# Patient Record
Sex: Female | Born: 1940 | Race: White | Hispanic: No | Marital: Married | Smoking: Never smoker
Health system: Southern US, Academic
[De-identification: ages and names within clinical notes are randomized; demographics above are authoritative.]

## PROBLEM LIST (undated history)

## (undated) DIAGNOSIS — H3530 Unspecified macular degeneration: Secondary | ICD-10-CM

## (undated) DIAGNOSIS — I749 Embolism and thrombosis of unspecified artery: Secondary | ICD-10-CM

## (undated) DIAGNOSIS — E119 Type 2 diabetes mellitus without complications: Secondary | ICD-10-CM

## (undated) DIAGNOSIS — C541 Malignant neoplasm of endometrium: Secondary | ICD-10-CM

## (undated) DIAGNOSIS — Z853 Personal history of malignant neoplasm of breast: Secondary | ICD-10-CM

## (undated) DIAGNOSIS — C3490 Malignant neoplasm of unspecified part of unspecified bronchus or lung: Secondary | ICD-10-CM

## (undated) DIAGNOSIS — I10 Essential (primary) hypertension: Secondary | ICD-10-CM

## (undated) HISTORY — DX: Personal history of malignant neoplasm of breast: Z85.3

## (undated) HISTORY — PX: HX GALL BLADDER SURGERY/CHOLE: SHX55

## (undated) HISTORY — DX: Embolism and thrombosis of unspecified artery: I74.9

## (undated) HISTORY — DX: Malignant neoplasm of unspecified part of unspecified bronchus or lung: C34.90

## (undated) HISTORY — PX: LUNG CANCER SURGERY: SHX702

## (undated) HISTORY — PX: HX COLONOSCOPY: 2100001147

## (undated) HISTORY — PX: PORTACATH PLACEMENT: SHX2246

## (undated) HISTORY — PX: HX LOBECTOMY: SHX167

## (undated) HISTORY — DX: Malignant neoplasm of endometrium: C54.1

## (undated) HISTORY — PX: HX HYSTERECTOMY: SHX81

## (undated) HISTORY — PX: HIP SURGERY: SHX245

---

## 1988-06-12 ENCOUNTER — Other Ambulatory Visit (HOSPITAL_COMMUNITY): Payer: Self-pay

## 2015-12-29 IMAGING — MG 3D SCREENING DIGITAL MAMMOGRAM WITH CAD
5 series · 7 of 24 positions shown · non-contrast
Comparison: 06/03/2016 and 05/11/2015.

------------- REPORT [id number] -------------
Exam:  

Annual screening digital mammogram with 3D Tomosynthesis with CAD
INDICATION: Screening.

[R CC · right · 0.10mm/px · 2 of 2 slices shown]
[im 1/2]
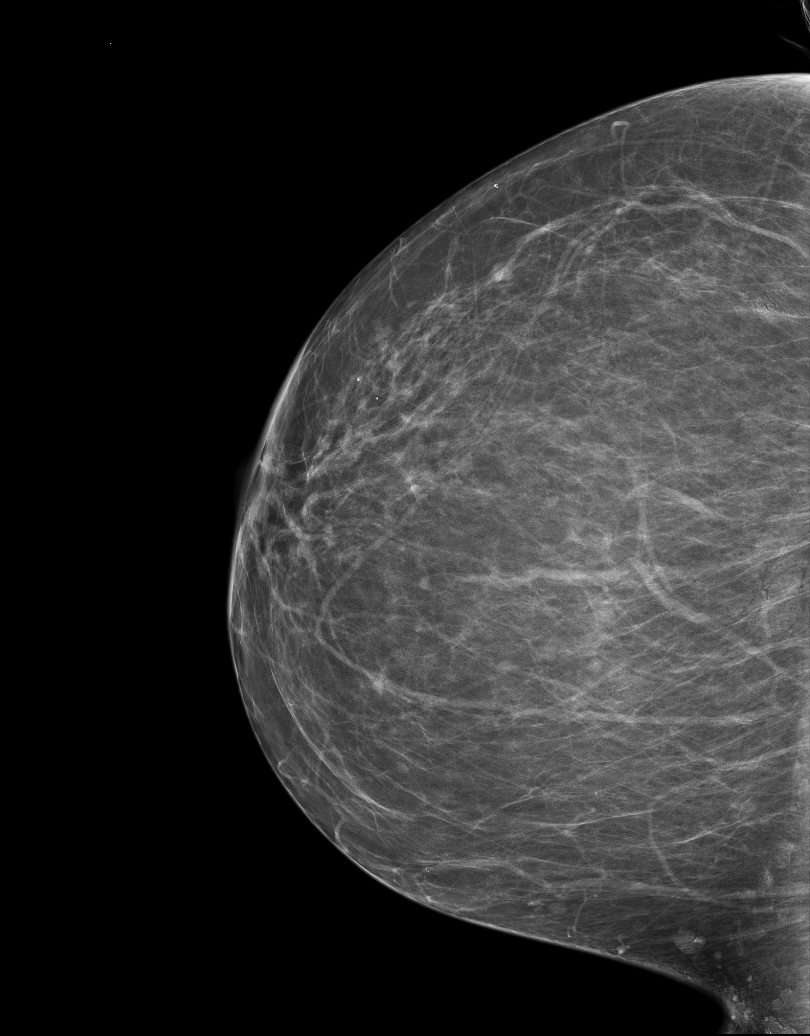
[im 2/2]
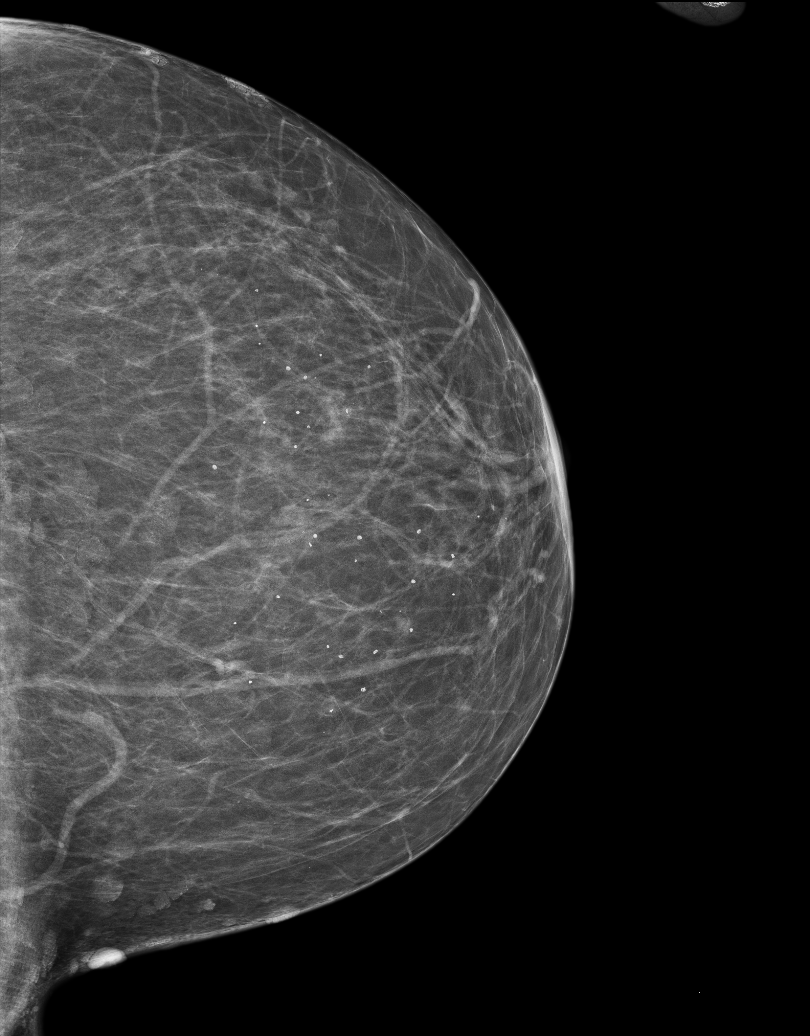

[3D SCREENING DIGITAL MAMMOGRAM WITH CAD · 2 acquisitions, 2 frames shown (1 of 2)]
[im 1/2]
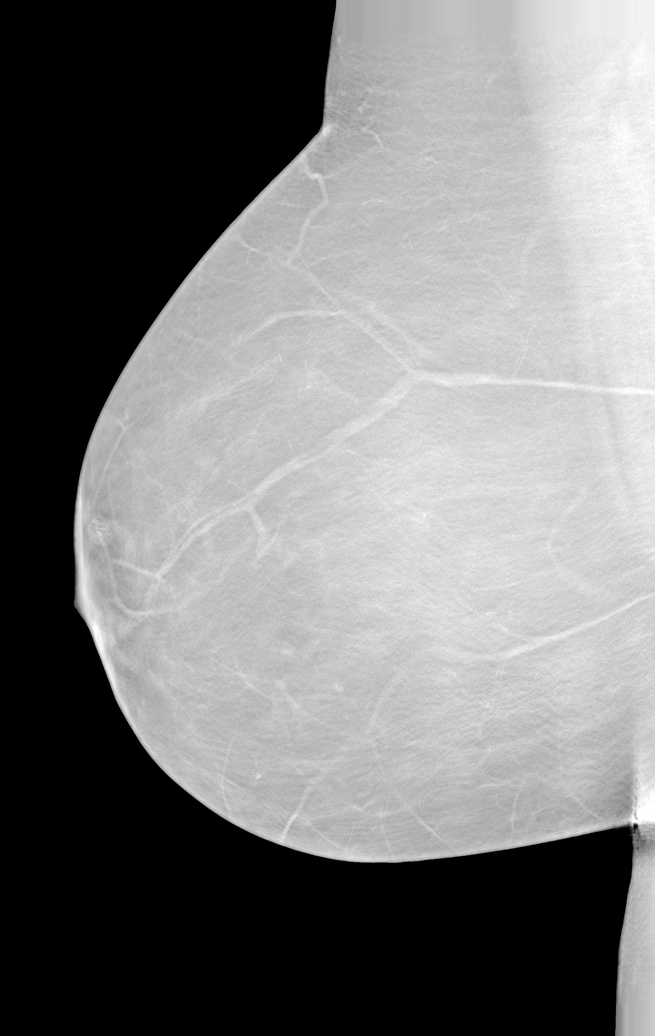
[im 2/2]
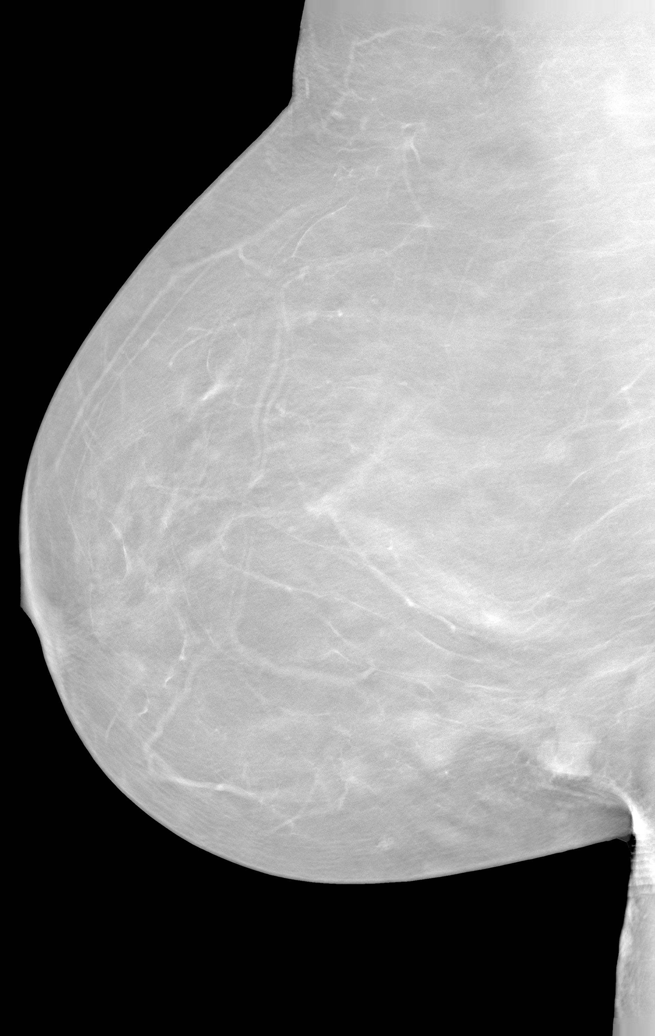

[R]
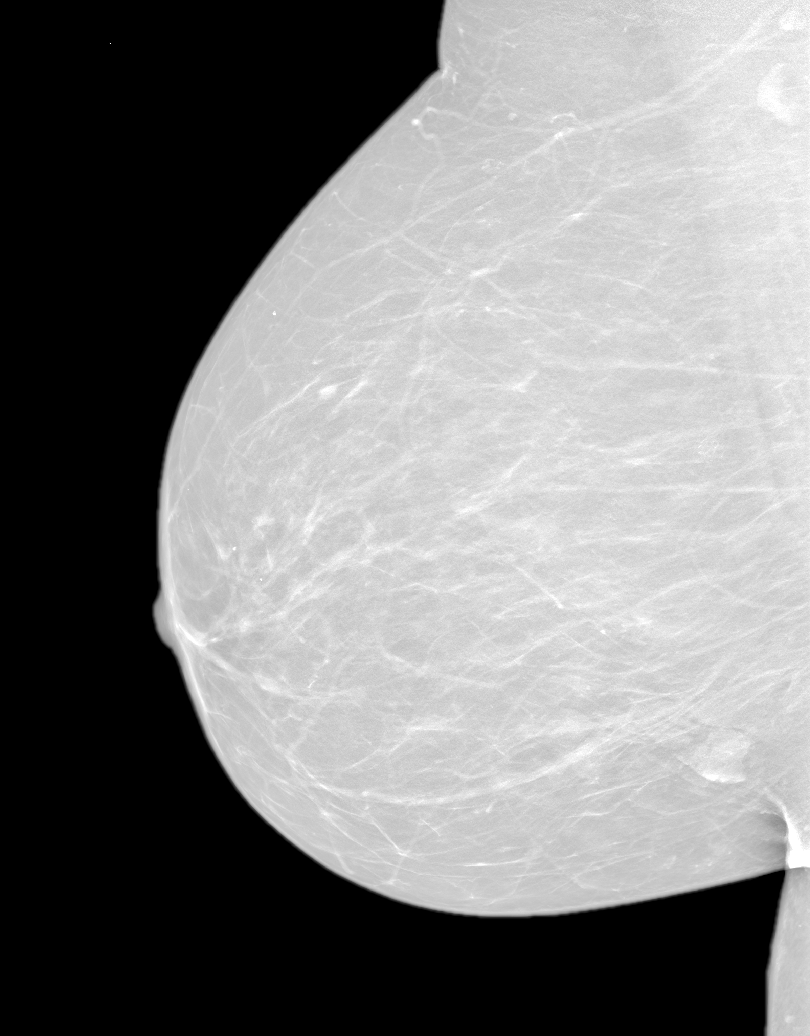

[3D SCREENING DIGITAL MAMMOGRAM WITH CAD (2 of 2) · tomo slice 13/81.0]
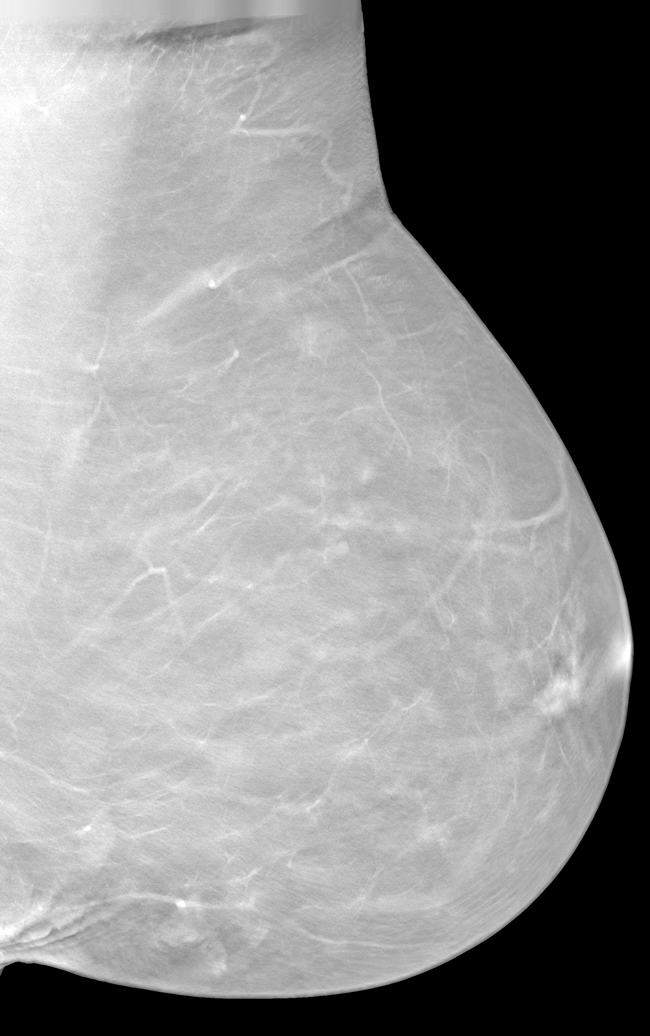

[L]
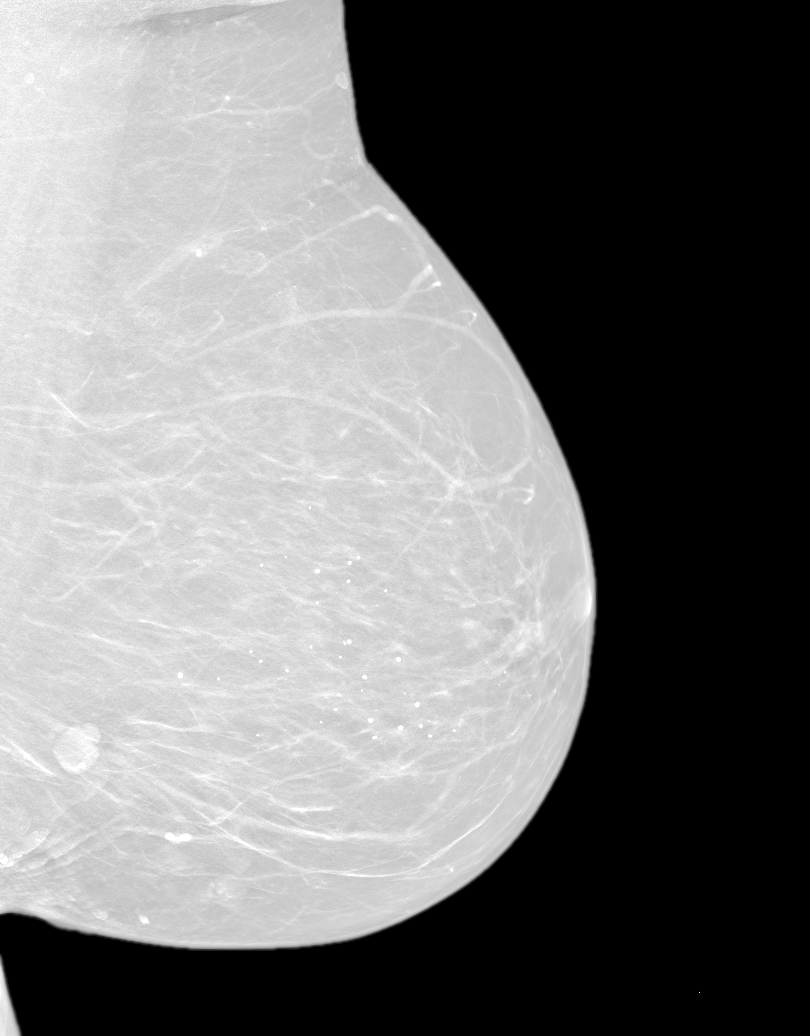

[7 of 24 positions shown; findings below may reference images not displayed]

FINDINGS: There are scattered fibroglandular elements. Multiple skin moles are again noted. There is no mass or suspicious cluster of microcalcifications. There is no architectural distortion, skin thickening or nipple retraction.
IMPRESSION: BIRADS 2-Benign findings. Patient has been added in a reminder system with a

target date for the next screening mammography.

DENSITY CODE –  B (Scattered areas of fibroglandular density).

Final Assessment Code:

Bi-Rads 2 

BI-RADS 0
Need additional imaging evaluation

BI-RADS 1
Negative mammogram

BI-RADS 2
Benign finding

BI-RADS 3
Probably benign finding: short-interval follow-up suggested

BI-RADS 4
Suspicious abnormality:  biopsy should be considered

BI-RADS 5
Highly suggestive of malignancy; appropriate action should be taken

BI-RADS 6
Known Biopsy-proven Malignancy – Appropriate action should be taken

NOTE:
In compliance with Federal regulations, the results of this mammogram are being sent to the patient.

------------- REPORT [id number] -------------
[facility]
[address]
Daina Ms.HAERTER, SHAKILL:
We wish to report the following on your recent mammography examination. We are sending a report to your referring physician or other health care provider. 
(       Normal/Negative:
No evidence of cancer.
This statement is mandated by the [place], Department of Health.
Your examination was performed by one of our technologists, who are registered radiological technologists and also specially certified in mammography:
___
Parlak, Edaly (M)
___
Dang, Mcalex (M)

Your mammogram was interpreted by our radiologist.

( 
Sofeine Made, M.D.

(Annual Breast Examination by a physician or other health care provider
(Annual Mammography Screening beginning at age 40
(Monthly Breast Self Examination

## 2016-12-31 IMAGING — MG 3D SCREENING MAMMO BIL W/CAD
5 series · 7 of 24 positions shown · non-contrast
Comparison: 04/02/2017 and 01/06/2016.

------------- REPORT [id number] -------------
Exam:  

Annual screening digital mammogram with 3D Tomosynthesis with CAD
INDICATION: Screening.

[R CC · right · 0.10mm/px · 2 of 2 slices shown]
[im 1/2]
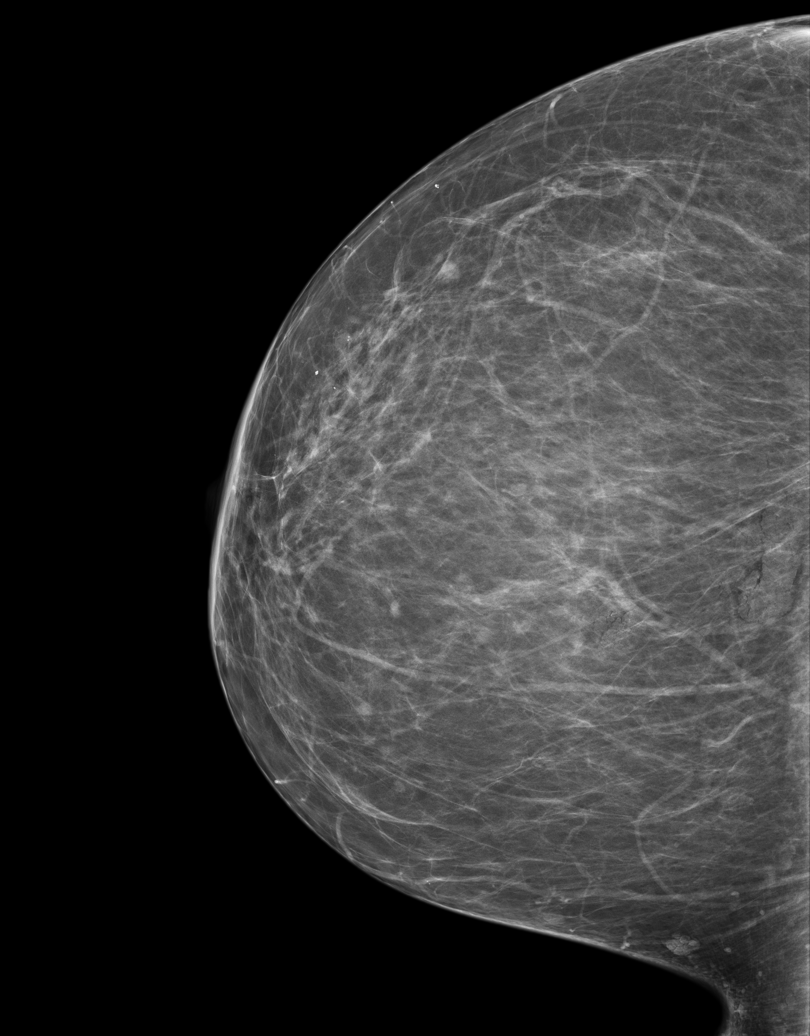
[im 2/2]
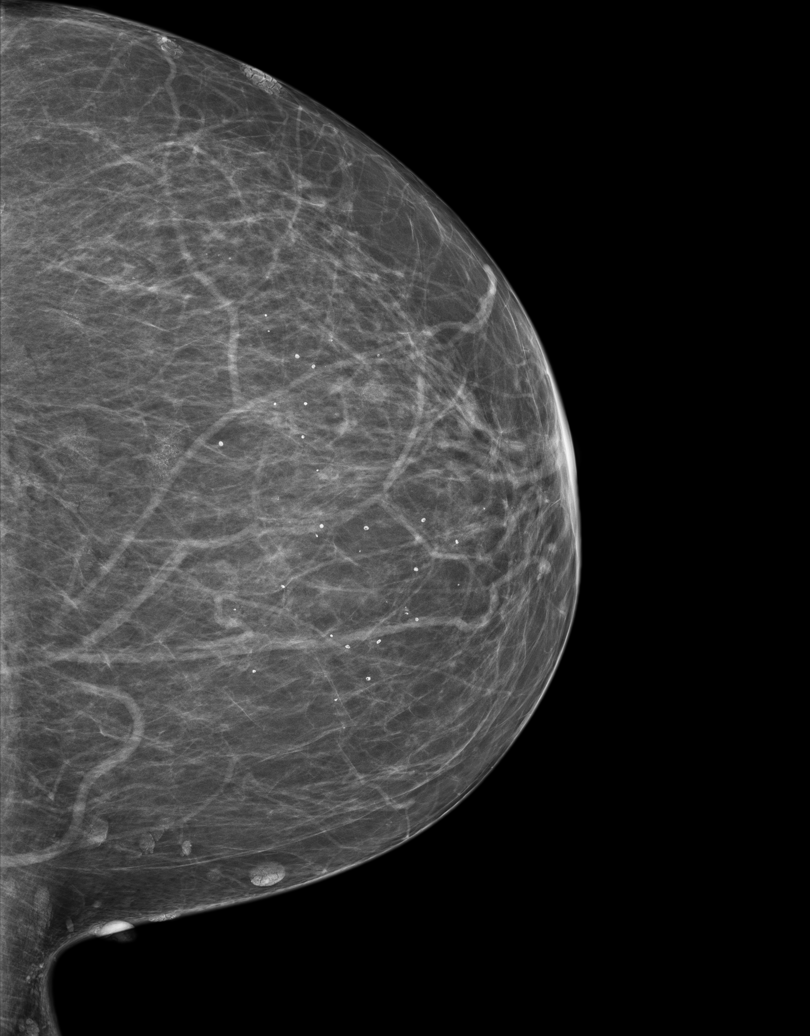

[3D SCREENING MAMMO BIL W/CAD · 2 acquisitions, 2 frames shown (1 of 2)]
[im 1/2]
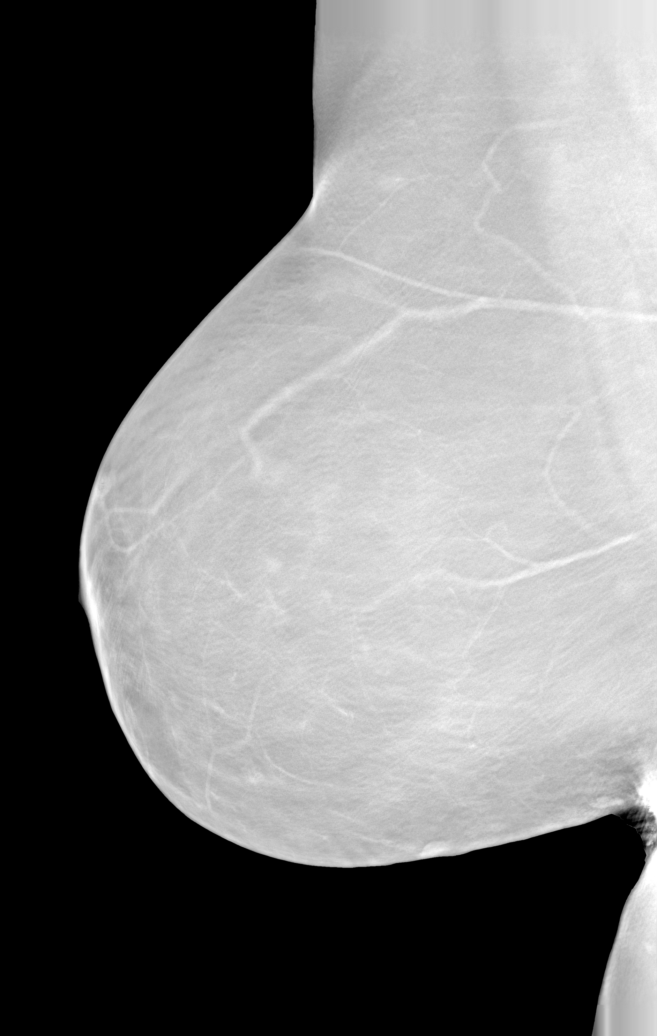
[im 2/2]
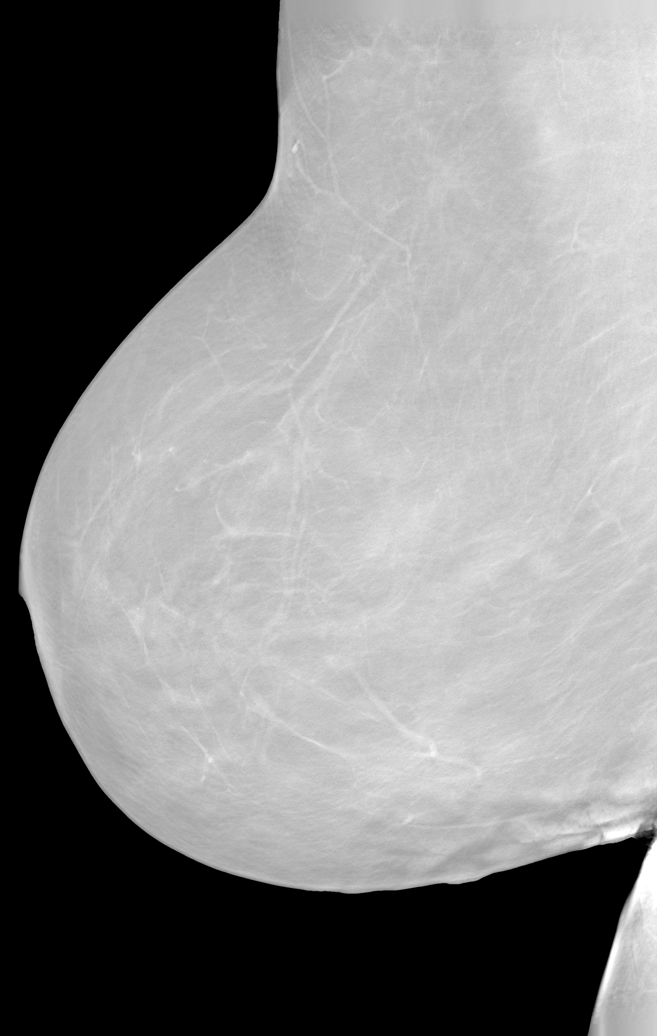

[3D SCREENING MAMMO BIL W/CAD (2 of 2) · tomo slice 13/83.0]
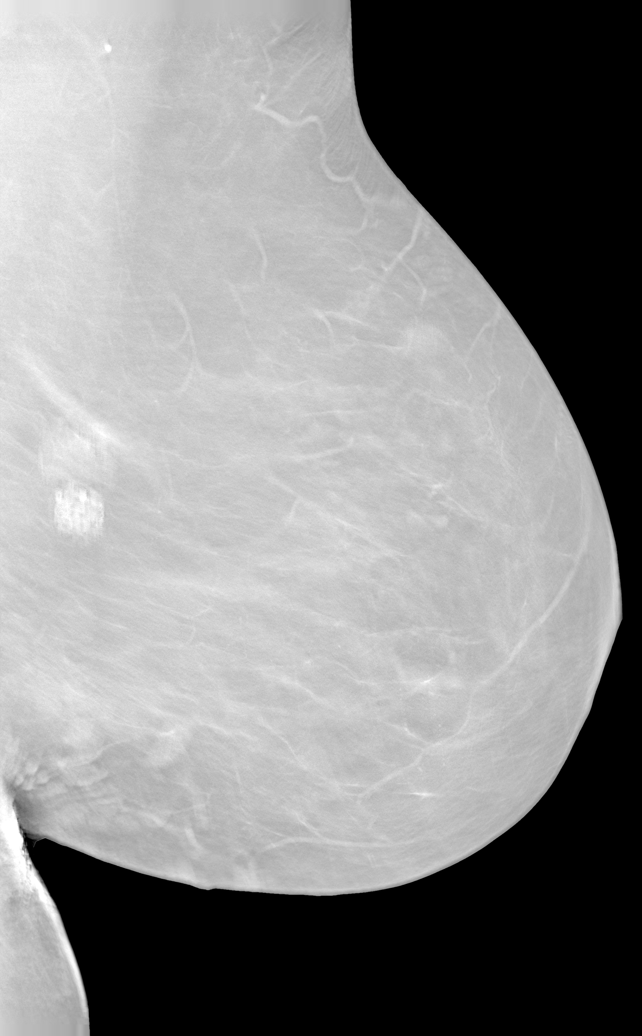

[R]
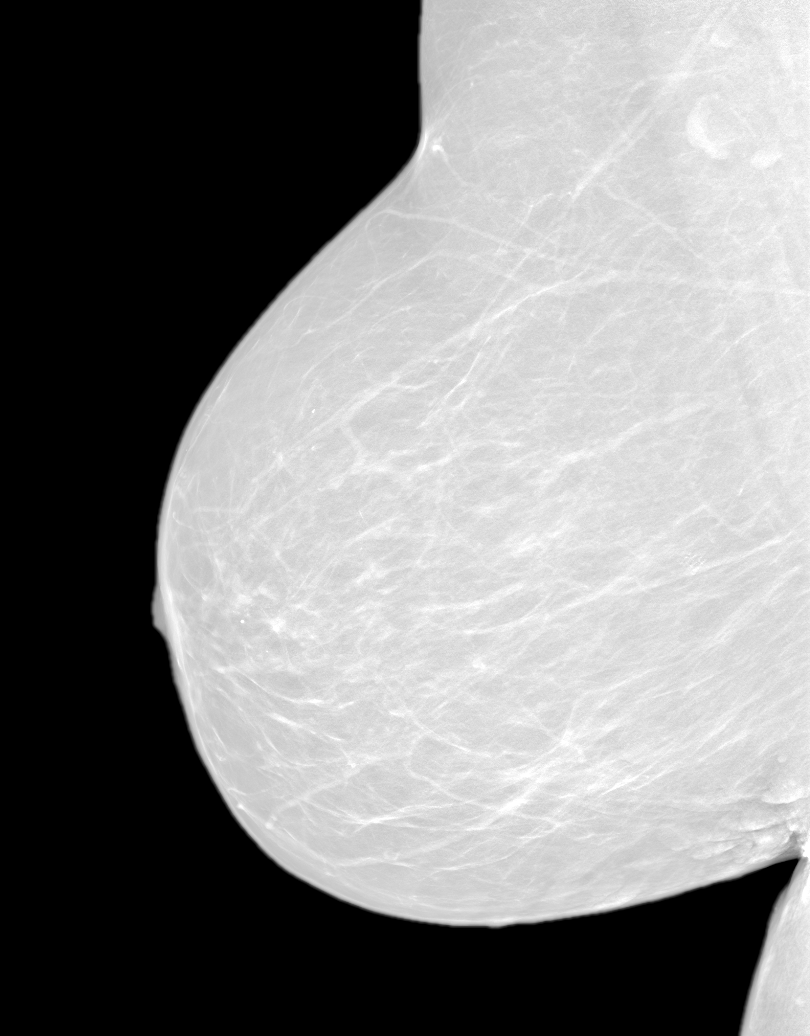

[L]
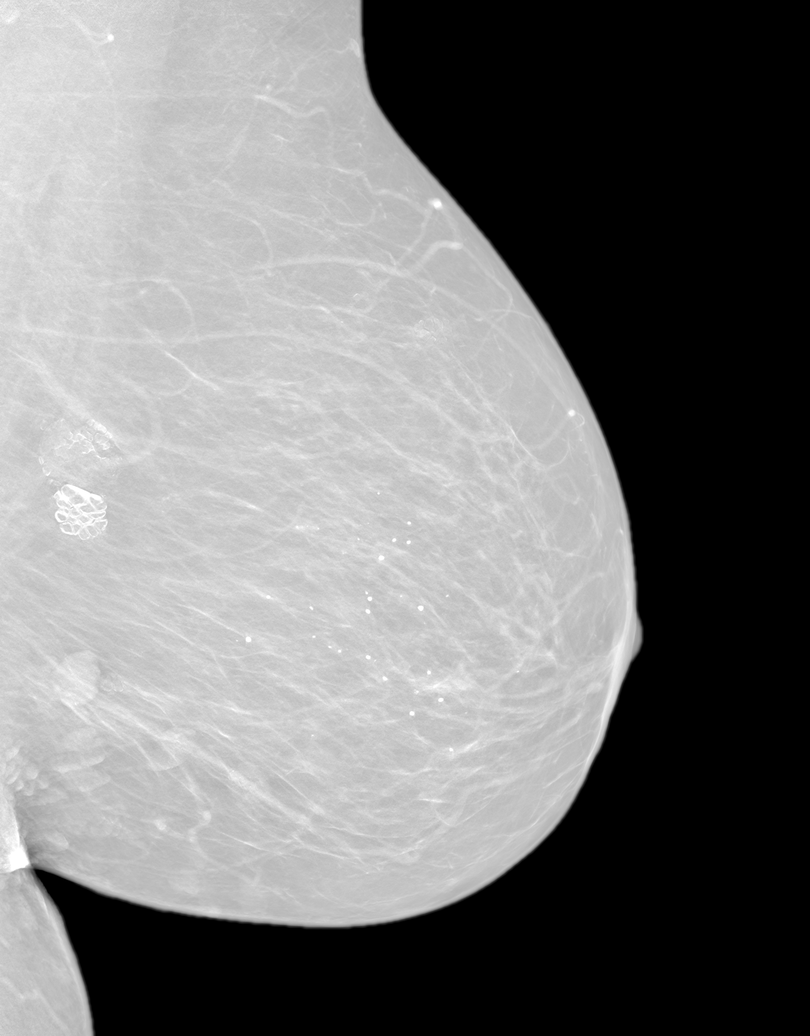

[7 of 24 positions shown; findings below may reference images not displayed]

FINDINGS: There are scattered fibroglandular elements. Multiple moles are again identified bilaterally. There is no mass or suspicious cluster of microcalcifications. There is no architectural distortion, skin thickening or nipple retraction.
IMPRESSION: BIRADS 2-Benign findings. Patient has been added in a reminder system with a

target date for the next screening mammography.

DENSITY CODE –  B (Scattered areas of fibroglandular density). 

Final Assessment Code:

Bi-Rads 2 

BI-RADS 0
Need additional imaging evaluation

BI-RADS 1
Negative mammogram

BI-RADS 2
Benign finding

BI-RADS 3
Probably benign finding: short-interval follow-up suggested

BI-RADS 4
Suspicious abnormality:  biopsy should be considered

BI-RADS 5
Highly suggestive of malignancy; appropriate action should be taken

BI-RADS 6
Known Biopsy-proven Malignancy – Appropriate action should be taken

NOTE:
In compliance with Federal regulations, the results of this mammogram are being sent to the patient.

------------- REPORT [id number] -------------
[facility]
[address]
Daina Ms.HAERTER, SHAKILL:
We wish to report the following on your recent mammography examination. We are sending a report to your referring physician or other health care provider. 
(       Normal/Negative:
No evidence of cancer.
This statement is mandated by the [place], Department of Health.
Your examination was performed by one of our technologists, who are registered radiological technologists and also specially certified in mammography:
___
Parlak, Edaly (M)
___
Dang, Mcalex (M)

Your mammogram was interpreted by our radiologist.

( 
Sofeine Made, M.D.

(Annual Breast Examination by a physician or other health care provider
(Annual Mammography Screening beginning at age 40
(Monthly Breast Self Examination

## 2018-04-14 IMAGING — MG 3D SCREENING MAMMO BIL W/CAD
4 series · 7 of 24 positions shown · non-contrast
Comparison: Mammograms 11/04/2018, 11/01/2017, 08/06/2016.

------------- REPORT [id number] -------------
EXAM:  3D BILATERAL ANNUAL SCREENING DIGITAL MAMMOGRAM WITH TOMOSYNTHESIS AND CAD
INDICATION: Screening.

[R CC · right · 0.10mm/px · 3 of 3 slices shown]
[im 1/3]
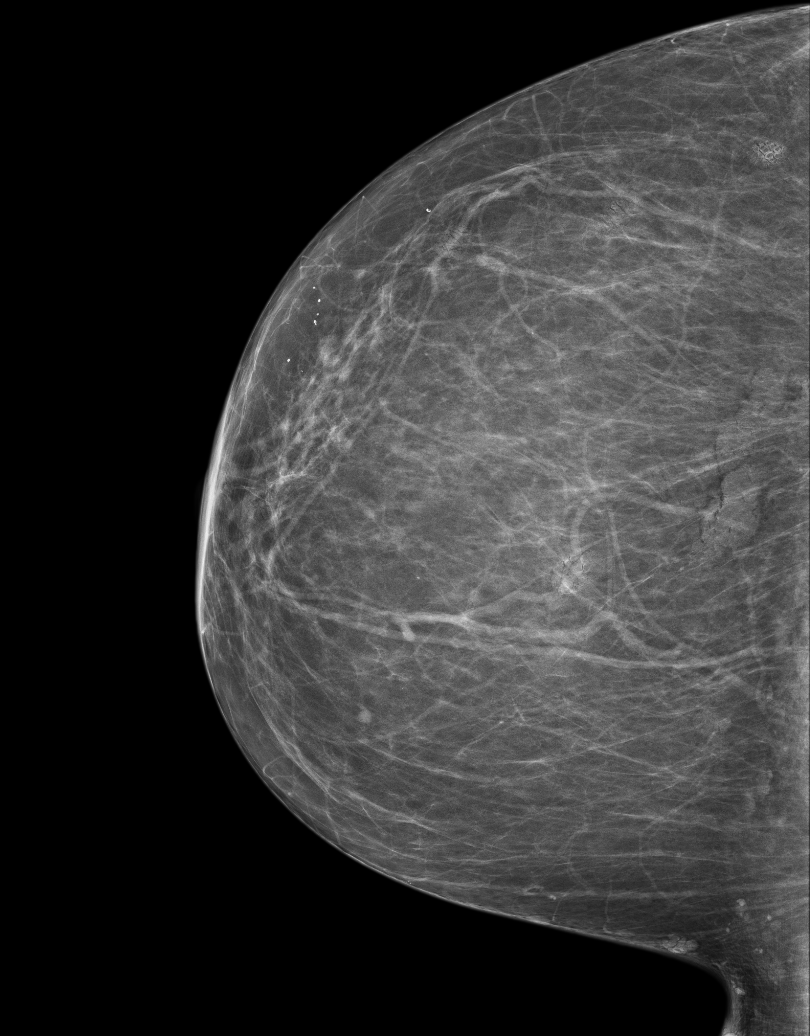
[im 2/3]
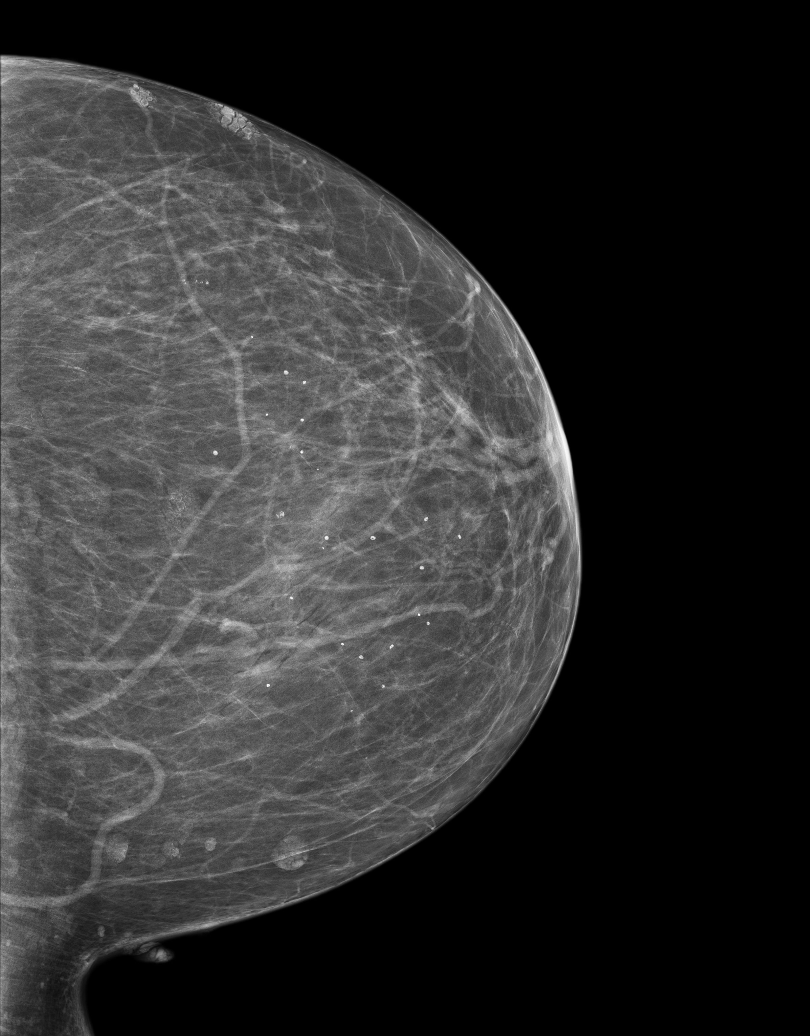
[im 3/3]
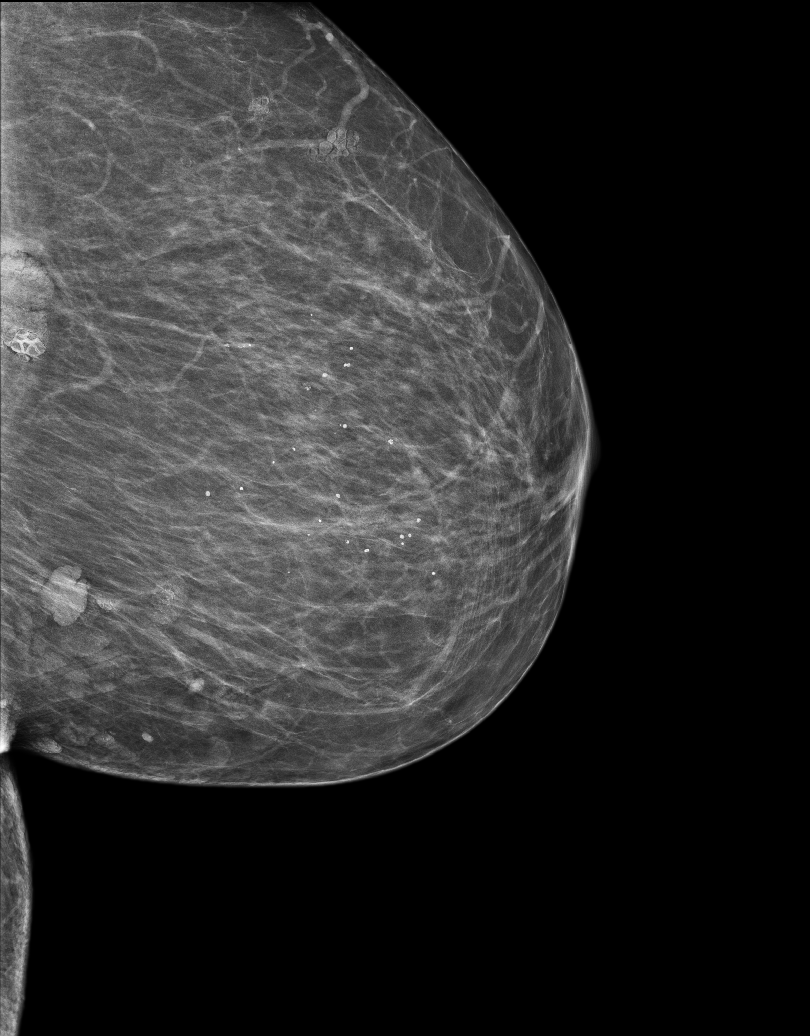

[3D SCREENING MAMMO BIL W/CAD · 2 acquisitions, 2 frames shown (1 of 2)]
[im 1/2]
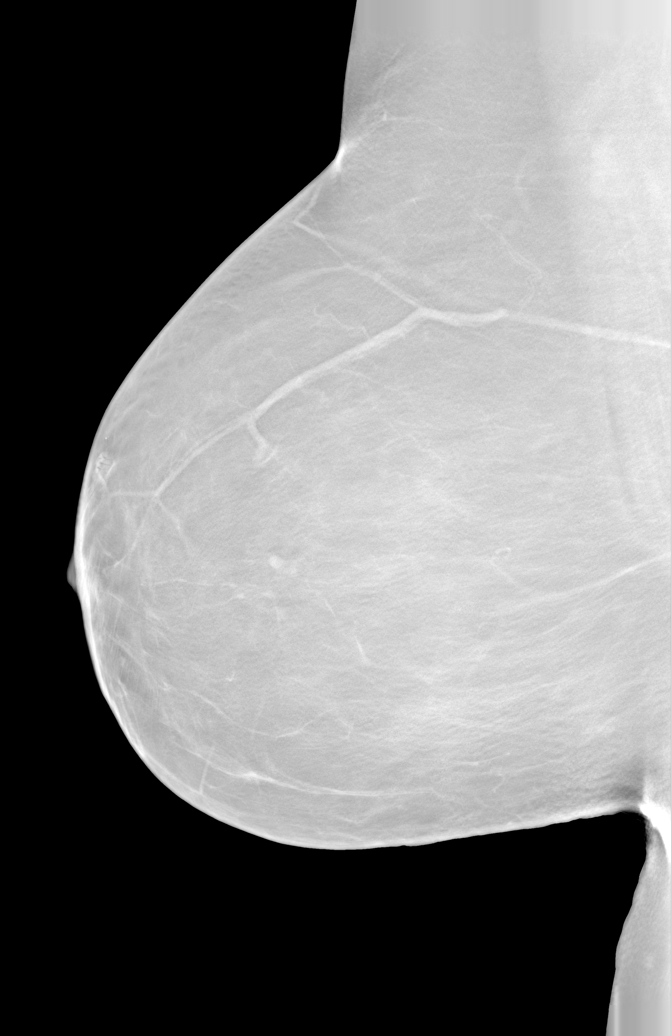
[im 2/2]
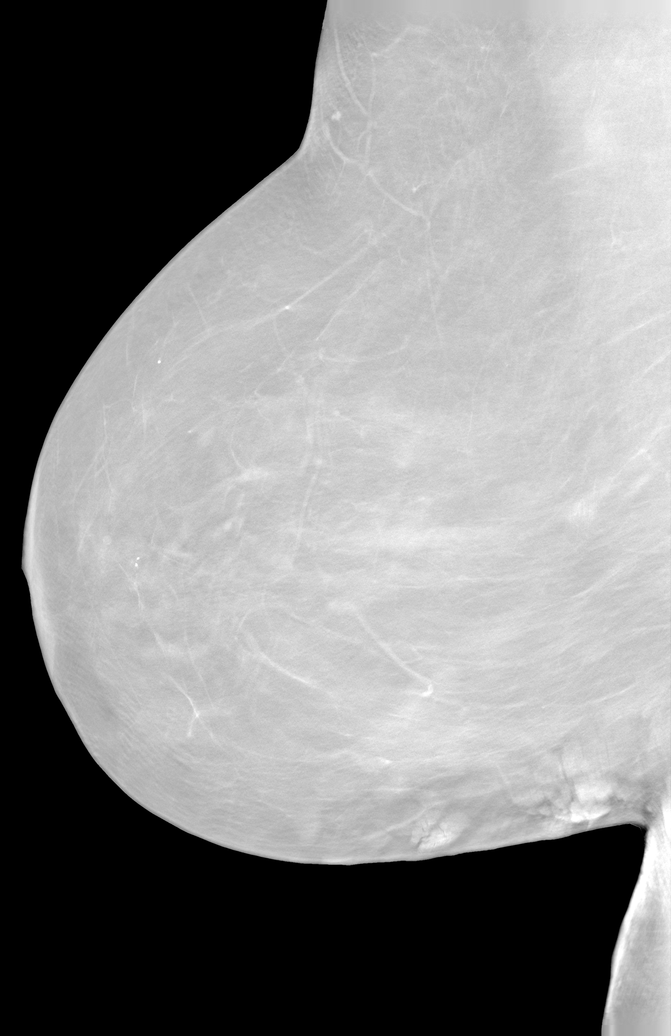

[3D SCREENING MAMMO BIL W/CAD (2 of 2) · tomo slice 12/77.0]
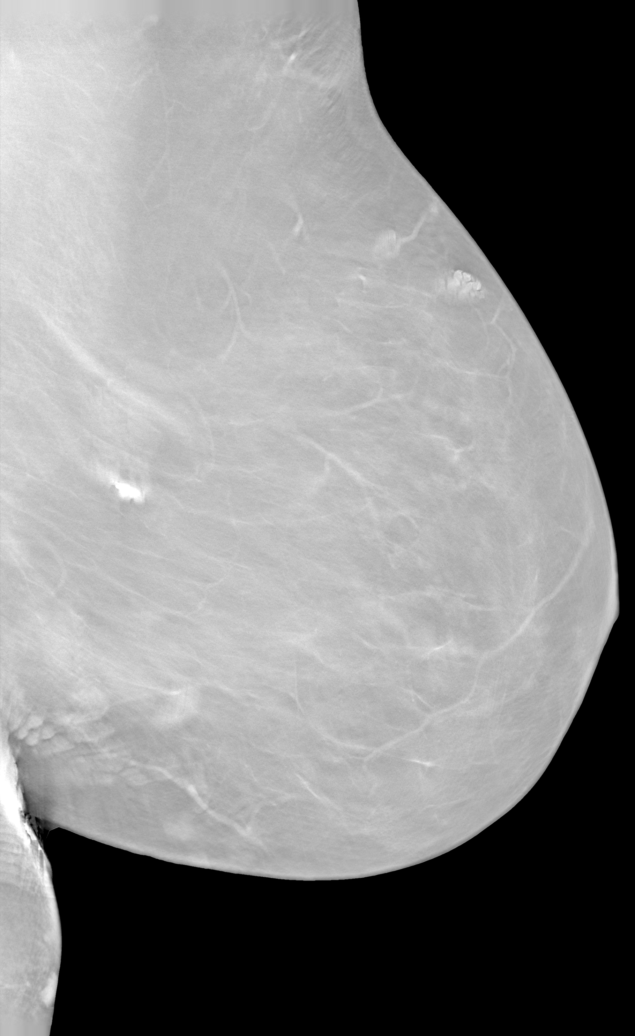

[R]
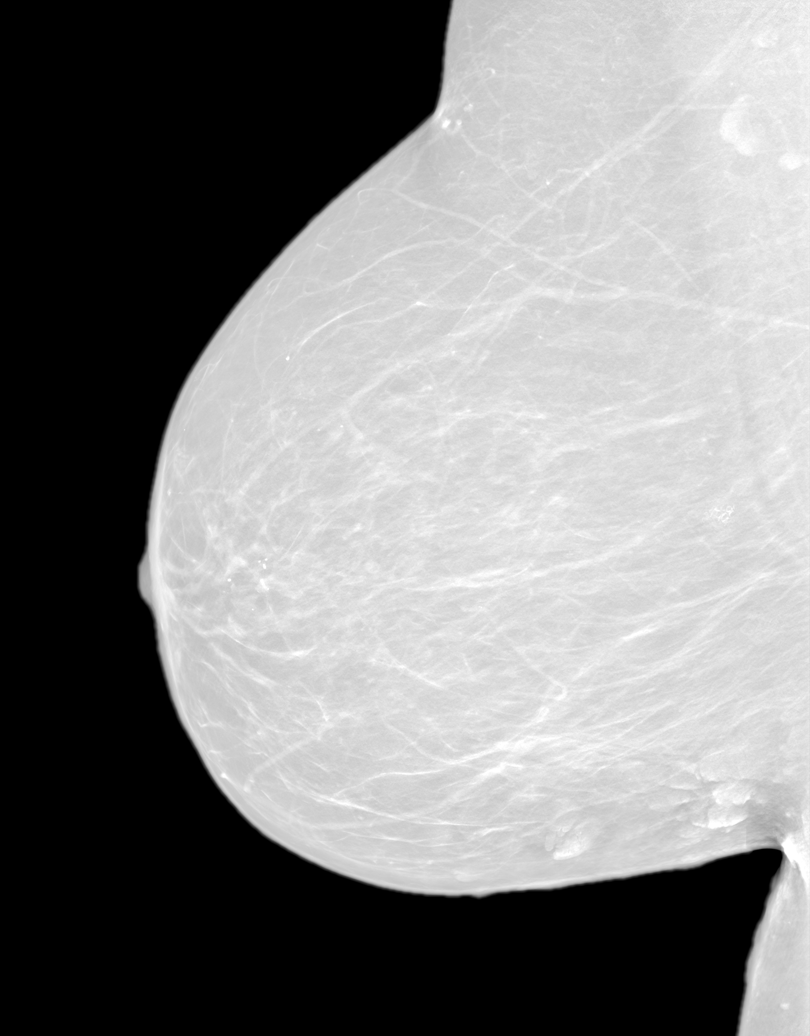

[7 of 24 positions shown; findings below may reference images not displayed]

FINDINGS: Scattered fibroglandular tissue. 

Scattered benign breast and skin calcifications.  Scattered moles.  

No suspicious masses, calcifications, or areas of architectural distortion.
IMPRESSION: 1.  BIRADS 2-Benign findings. Patient has been added in a reminder system with a target date for the next screening mammography.

2.  DENSITY CODE –  B (Scattered areas of fibroglandular density). 

Final Assessment Code:

Bi-Rads 2 

BI-RADS 0
Need additional imaging evaluation

BI-RADS 1
Negative mammogram

BI-RADS 2
Benign finding

BI-RADS 3
Probably benign finding: short-interval follow-up suggested

BI-RADS 4
Suspicious abnormality:  biopsy should be considered

BI-RADS 5
Highly suggestive of malignancy; appropriate action should be taken

BI-RADS 6
Known Biopsy-proven Malignancy – Appropriate action should be taken

NOTE:
In compliance with Federal regulations, the results of this mammogram are being sent to the patient.

------------- REPORT [id number] -------------
[facility]
[address]
Daina Ms.HAERTER, SHAKILL:
We wish to report the following on your recent mammography examination. We are sending a report to your referring physician or other health care provider. 
(       Normal/Negative:
No evidence of cancer.
This statement is mandated by the [place], Department of Health.
Your examination was performed by one of our technologists, who are registered radiological technologists and also specially certified in mammography:
___
Parlak, Edaly (M)
___
Dang, Mcalex (M)

Your mammogram was interpreted by our radiologist.

( 
Sofeine Made, M.D.

(Annual Breast Examination by a physician or other health care provider
(Annual Mammography Screening beginning at age 40
(Monthly Breast Self Examination

## 2018-11-05 IMAGING — CT CT THORAX WITH CONTRAST
2 of 4 series · 15 of 36 positions shown, 18 images · IV contrast (CONTRAST)
Comparison: Chest CT scan dated 07/31/2012.

EXAM:  CT THORAX WITH CONTRAST
INDICATION: History of lung cancer.
TECHNIQUE: Helical CT imaging of the chest was performed following intravenous administration of 75 mL of Optiray 300. Images were reviewed in multiple windows and projections. Exam was performed using 1 or more of the following dose reduction techniques: Automated exposure control, adjustment of the mA and/or kV according to patient size, or the use of iterative reconstruction technique.

[ax post · axial · 0.75mm/px · z∈[+519,+712]mm · 12 of 116 slices shown, 15 images]
[im 9/116  mediastinal]
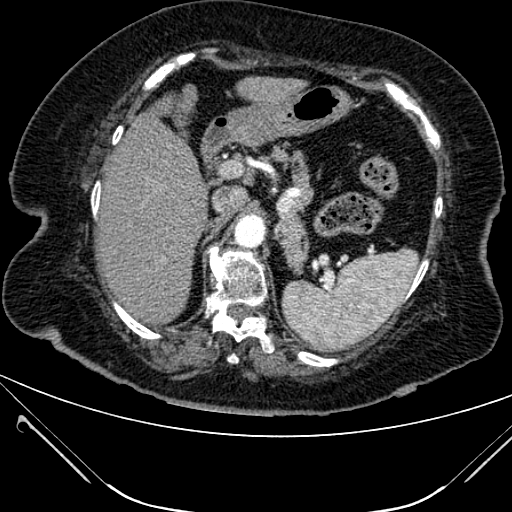
[im 9/116  lung]
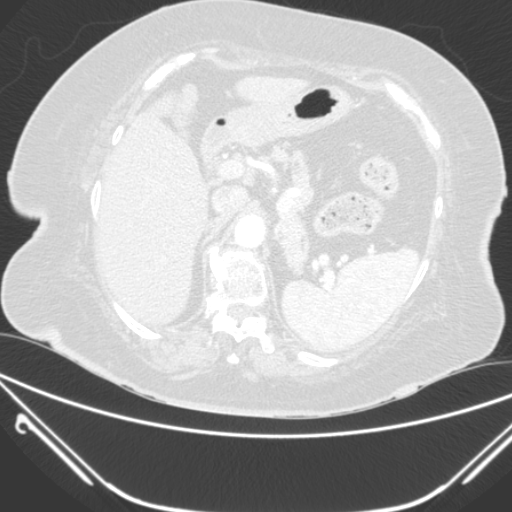
[im 18/116  lung]
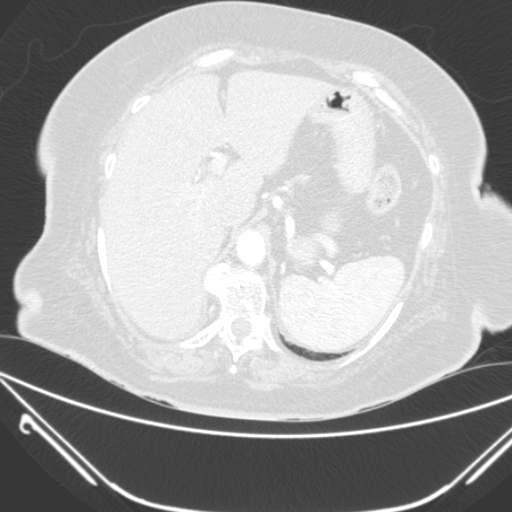
[im 27/116  lung]
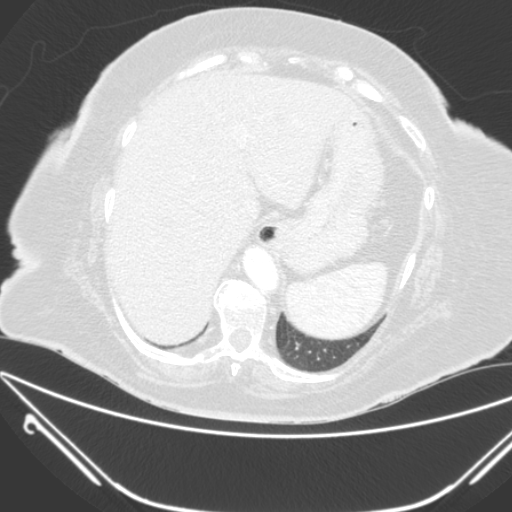
[im 36/116  lung]
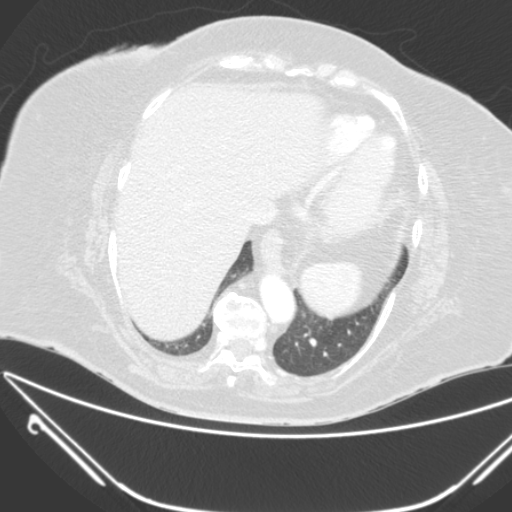
[im 45/116  mediastinal]
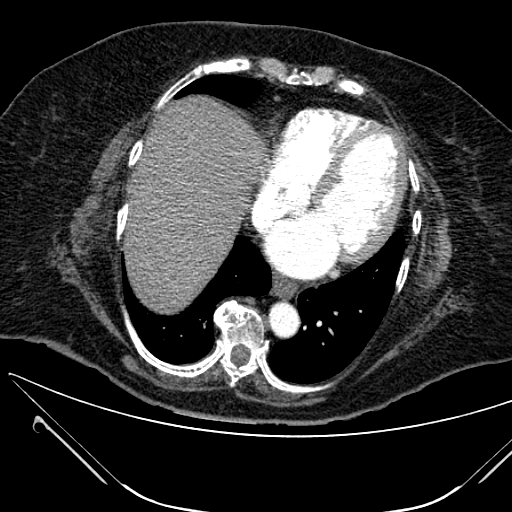
[im 45/116  lung]
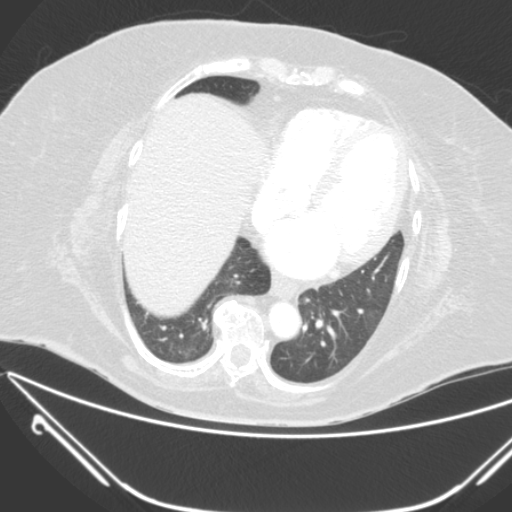
[im 54/116  lung]
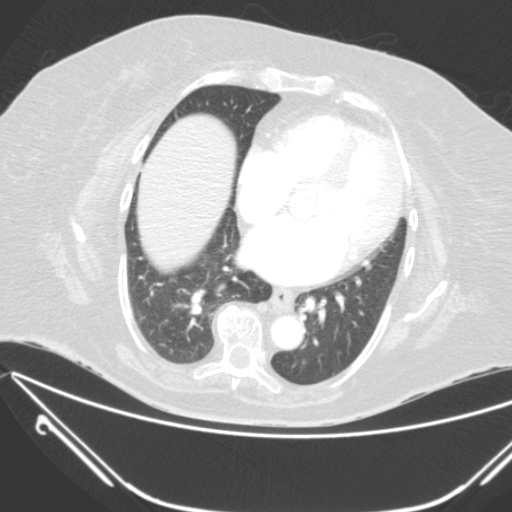
[im 62/116  lung]
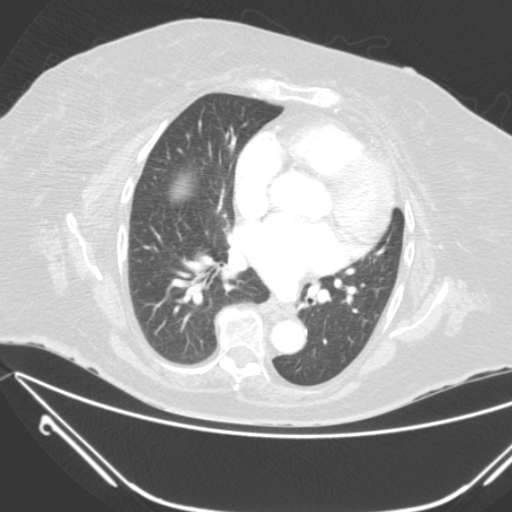
[im 71/116  lung]
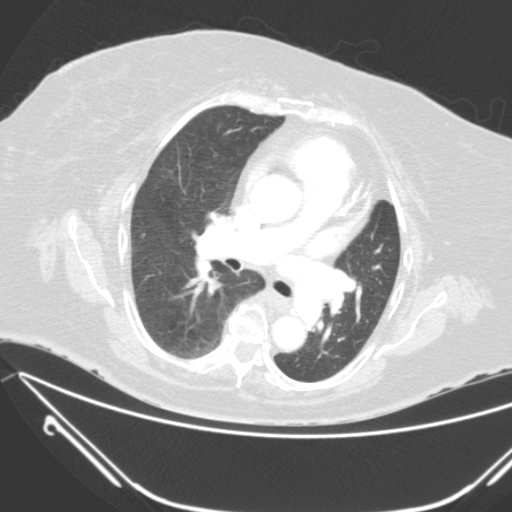
[im 80/116  mediastinal]
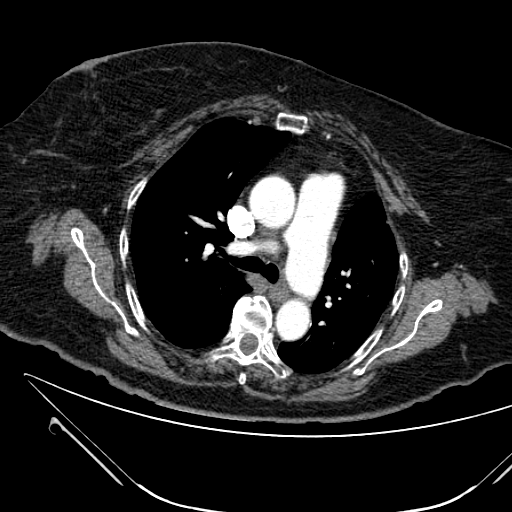
[im 80/116  lung]
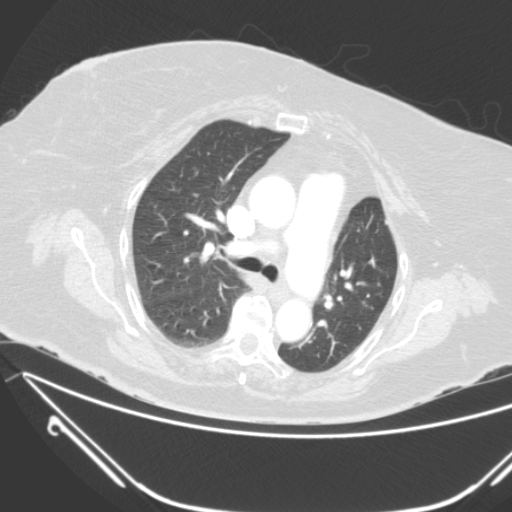
[im 89/116  lung]
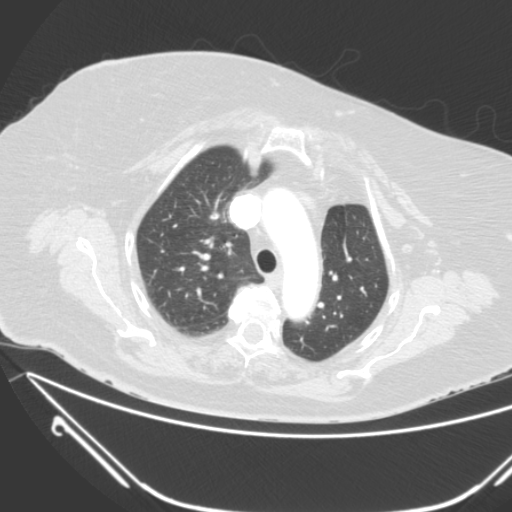
[im 98/116  lung]
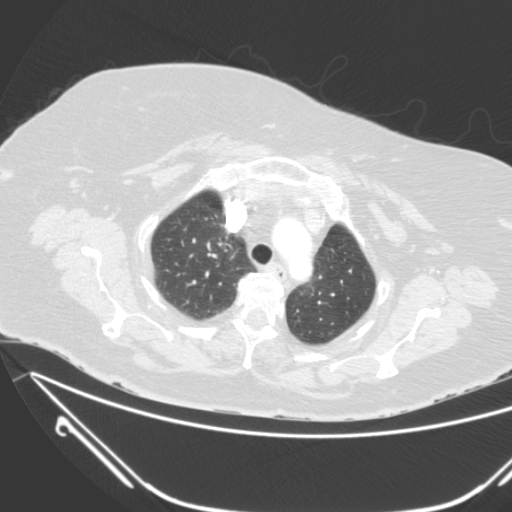
[im 107/116  lung]
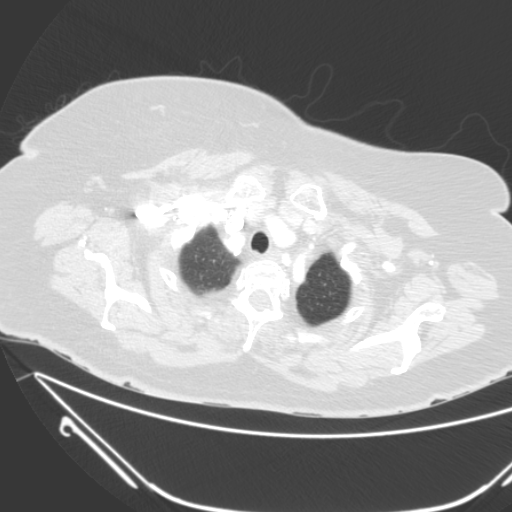

[cor · coronal · 0.78mm/px · 3 of 123 slices shown]
[im 25/123  lung]
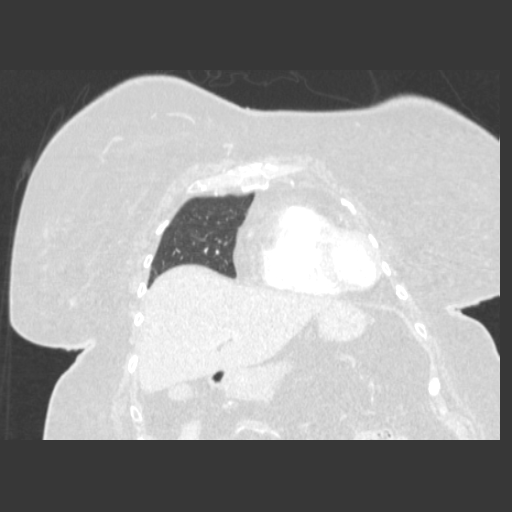
[im 49/123  lung]
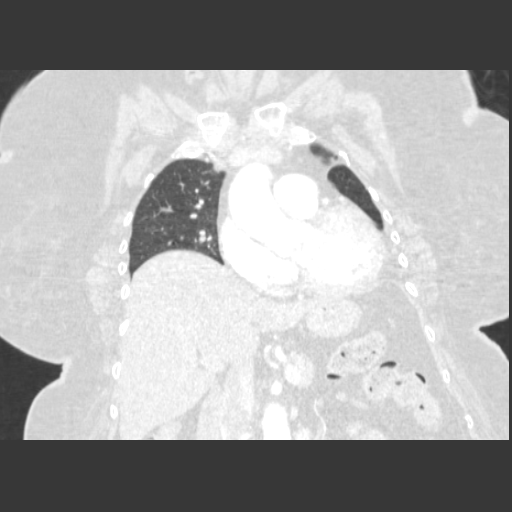
[im 74/123  lung]
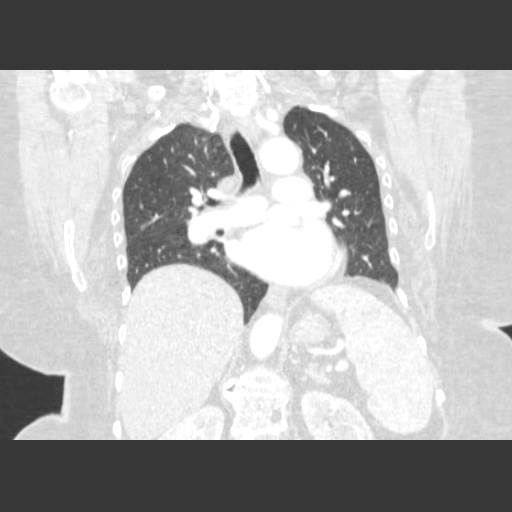

[15 of 36 positions shown; findings below may reference images not displayed]

FINDINGS: There are multiple small thyroid nodules. Trachea and mainstem bronchi are patent. There is no hilar, mediastinal or axillary adenopathy. Patient is status post left lower lobectomy. There is no suspicious lung nodule. There is no pleural or pericardial effusion. There is no lung consolidation. There is no aortic aneurysm or dissection. There is a small hiatal hernia. There is fatty infiltration of the liver. There is thickening of the left adrenal gland. There are moderate multilevel arthritic changes within the thoracic spine with exaggeration of thoracic kyphosis.
IMPRESSION: Postsurgical changes of left lower lobectomy. No definite evidence of malignancy. 

No lung consolidation, pleural or pericardial effusion. 

Multiple small thyroid nodules. Follow-up nonemergent thyroid sonogram is recommended for better assessment.

## 2018-11-11 IMAGING — US US SOFT TISSUES OF HEAD AND NECK
1 series · 14 of 25 positions shown · non-contrast
Comparison: Chest CT scan dated 11/05/2018.

EXAM:  US SOFT TISSUES OF HEAD AND NECK
INDICATION: Thyroid nodules.

[Series 3: us soft tissues of head and neck · 0.08mm/px · 14 of 51 slices shown]
[im 1/51]
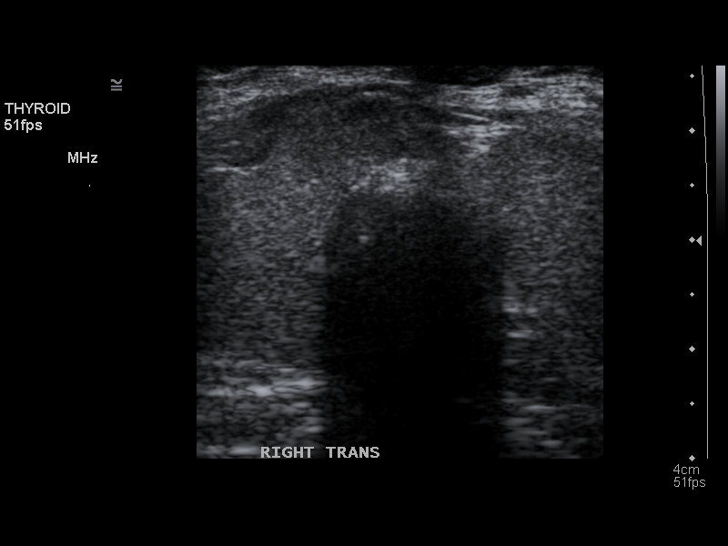
[im 5/51]
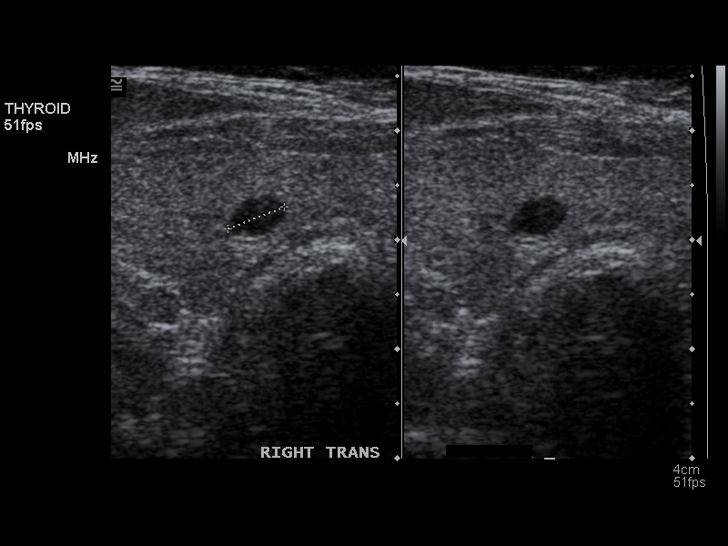
[im 9/51]
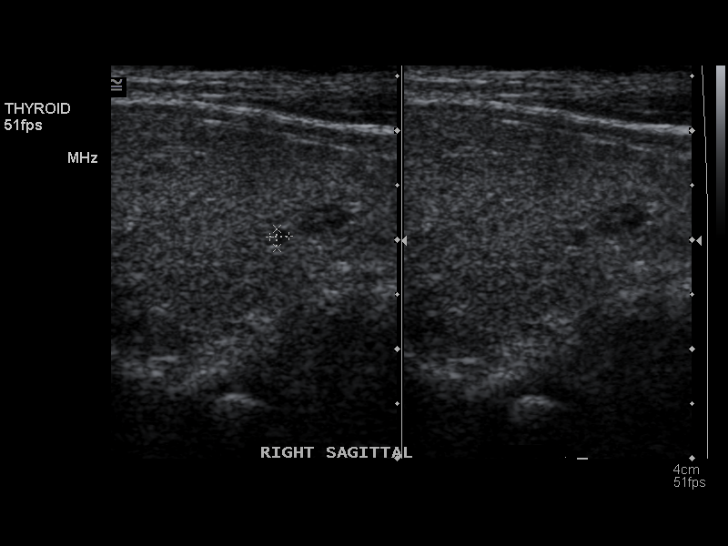
[im 13/51]
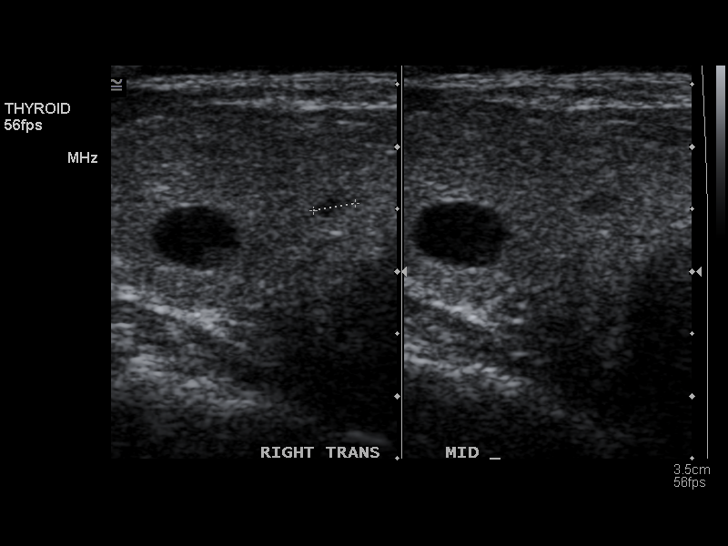
[im 17/51]
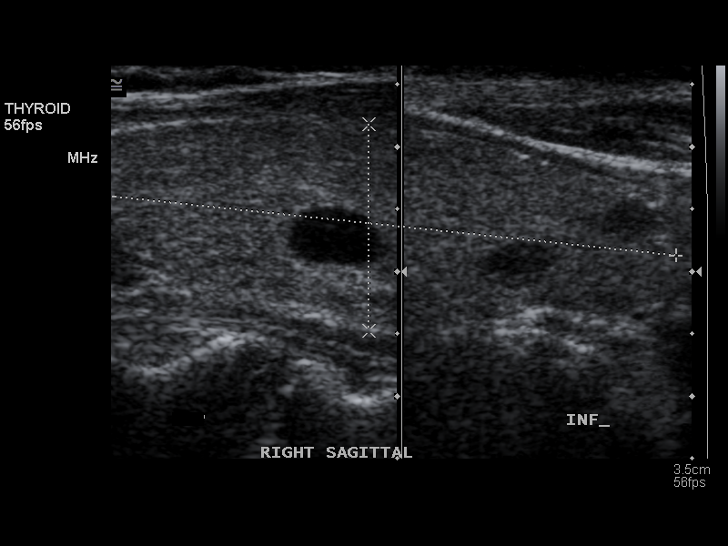
[im 19/51]
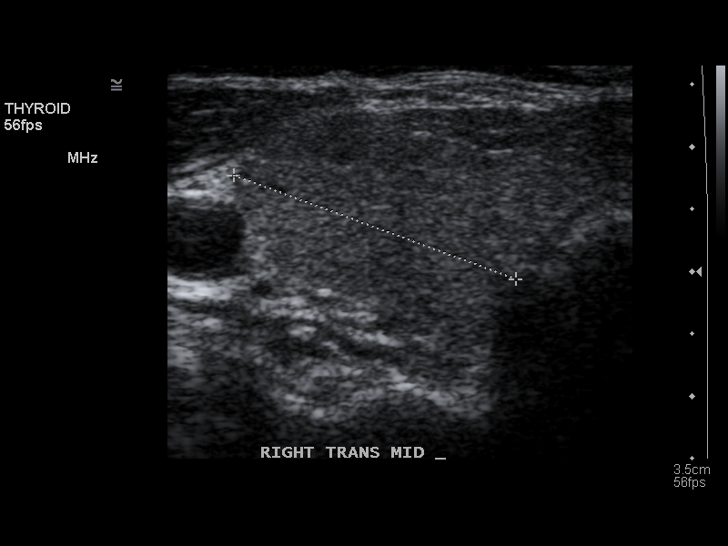
[im 23/51]
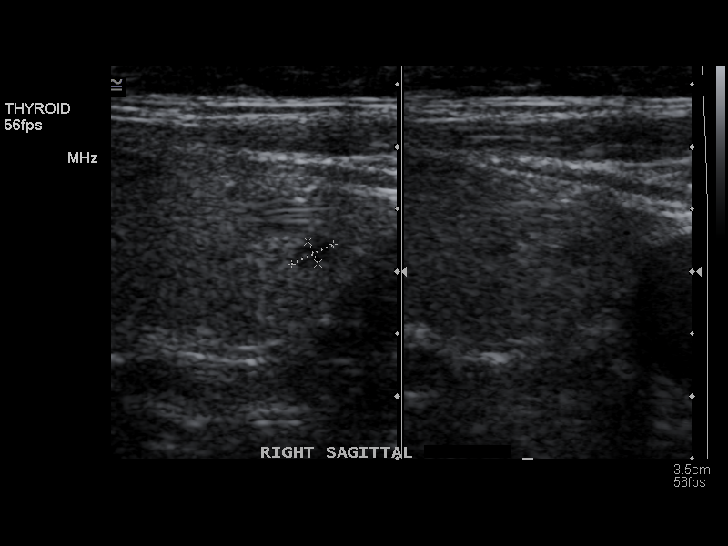
[im 28/51]
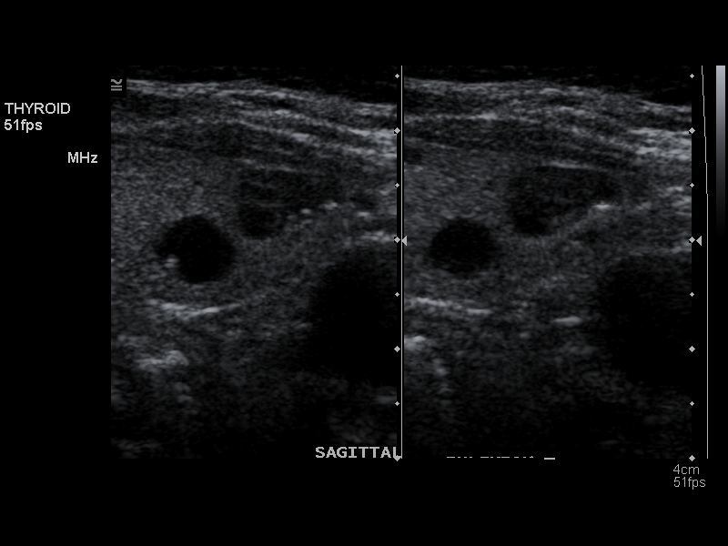
[im 32/51]
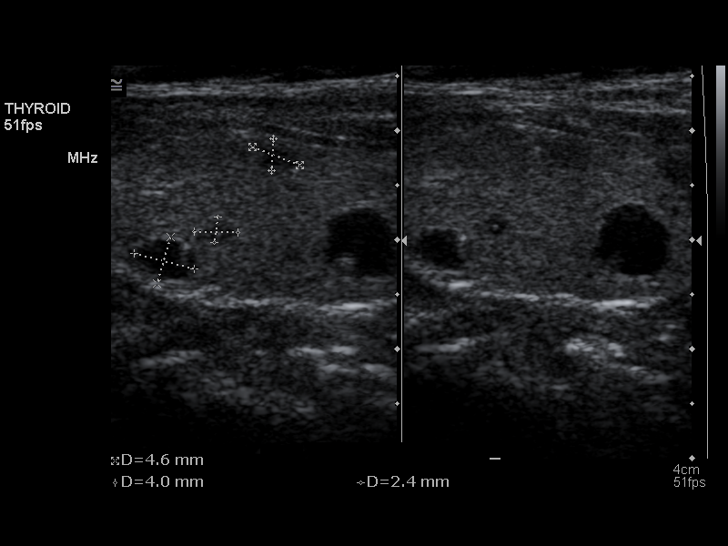
[im 34/51]
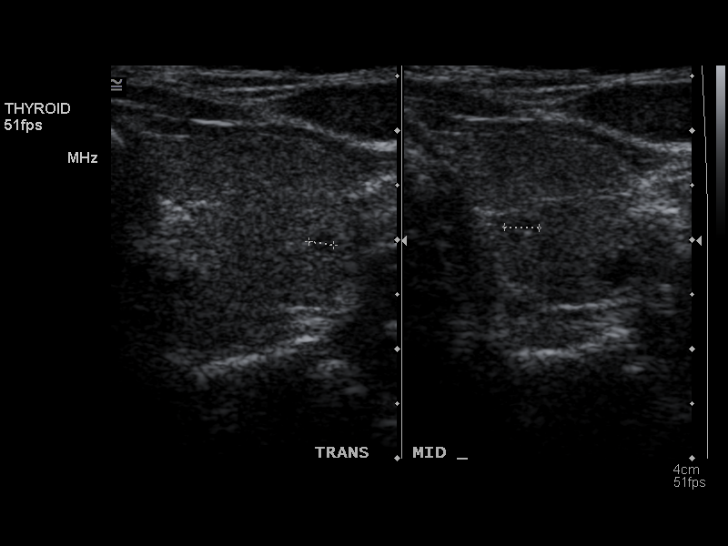
[im 38/51]
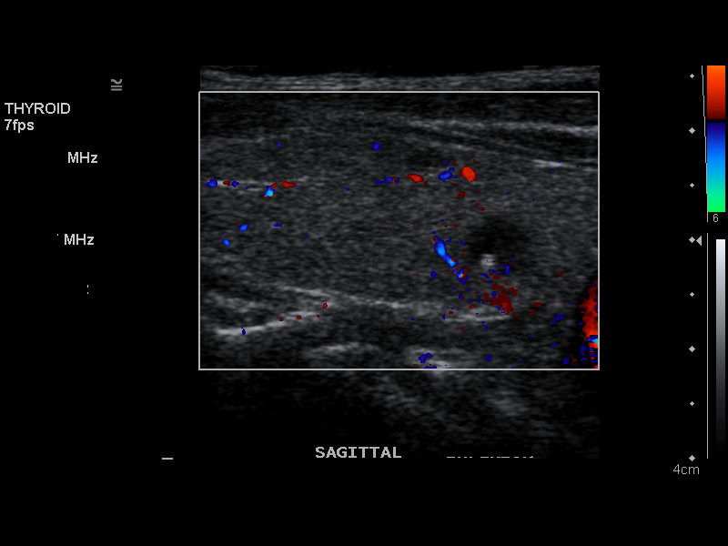
[im 42/51]
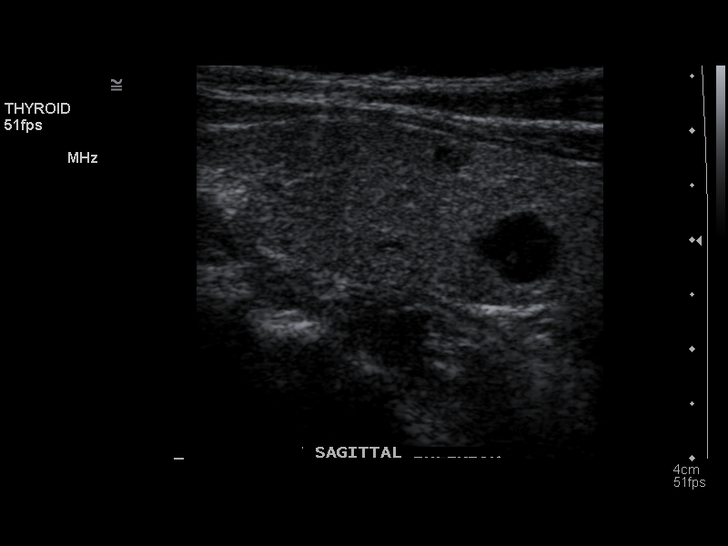
[im 46/51]
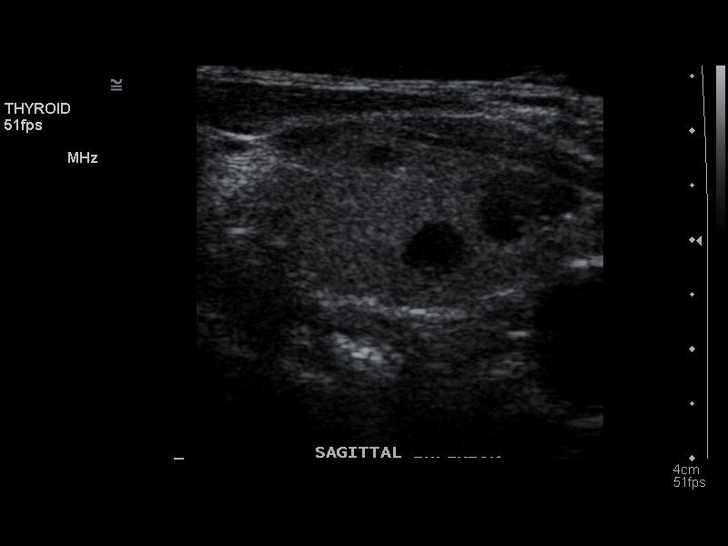
[im 51/51]
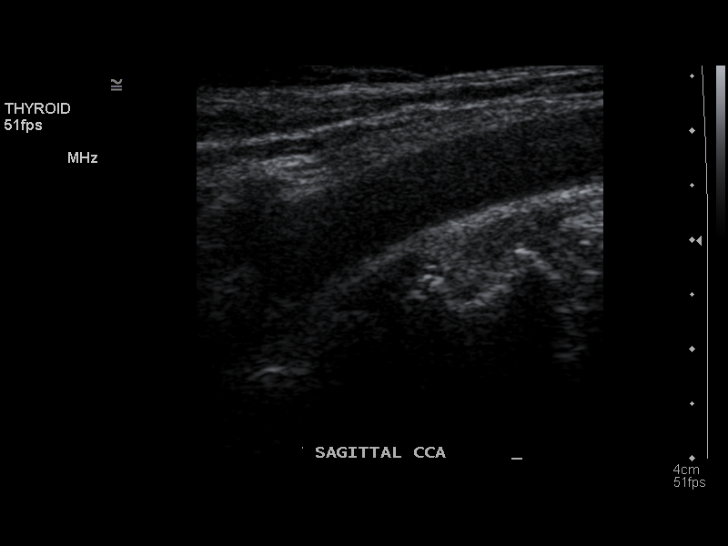

[14 of 25 positions shown; findings below may reference images not displayed]

FINDINGS: Thyroid gland is normal in echogenicity. Right lobe measures 4.6 x 1.7 x 2.4 cm and left lobe measures 5.2 x 1.5 x 1.8 cm. There are multiple small cysts within both lobes, measuring up to 11 mm within the inferior left lobe.
IMPRESSION: Multiple small thyroid cysts.

## 2018-12-30 IMAGING — CR XRAY HIP W/PELVIS UNIL 2-3 VIEWS
1 series · 2 of 2 positions shown · non-contrast
Comparison: None available.

EXAM:  XRAY HIP W/PELVIS UNIL 2-3 VIEWS
INDICATION: Right hip pain.

[Series 4: view not recorded · 0.17mm/px · 2 of 2 slices shown]
[im 1/2]
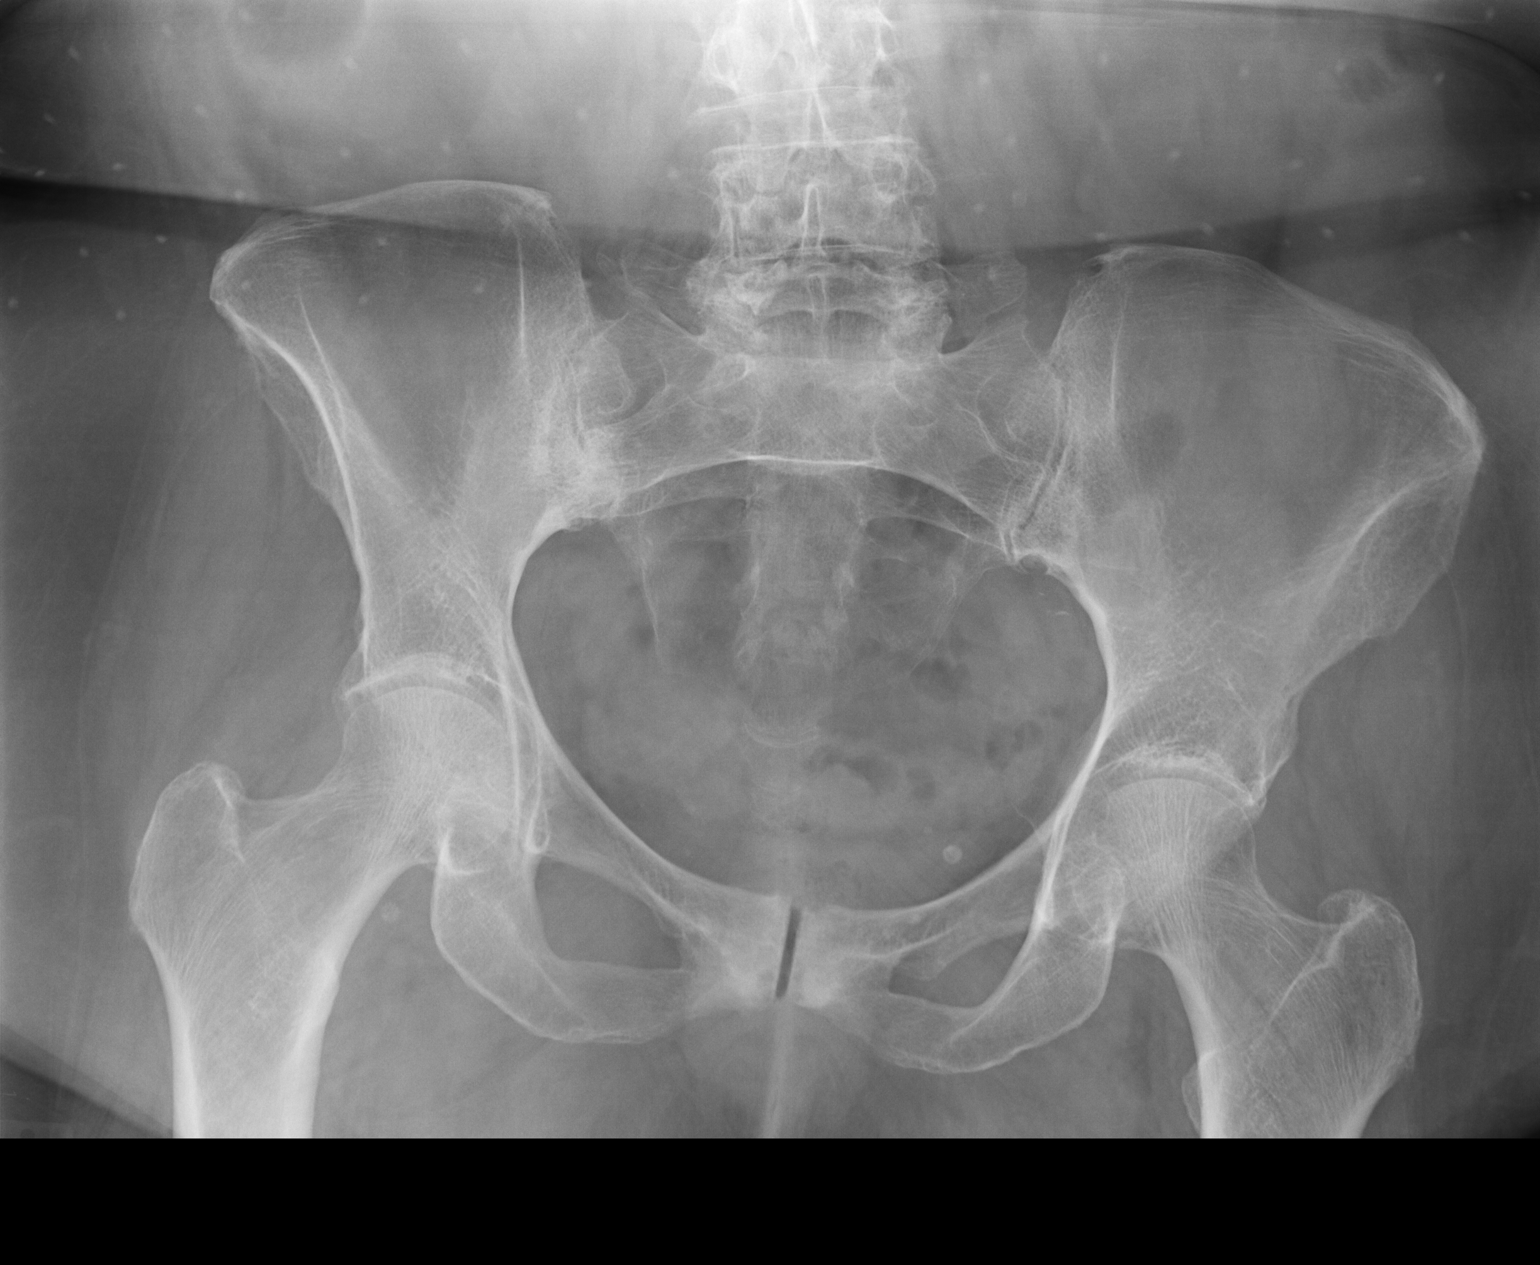
[im 2/2]
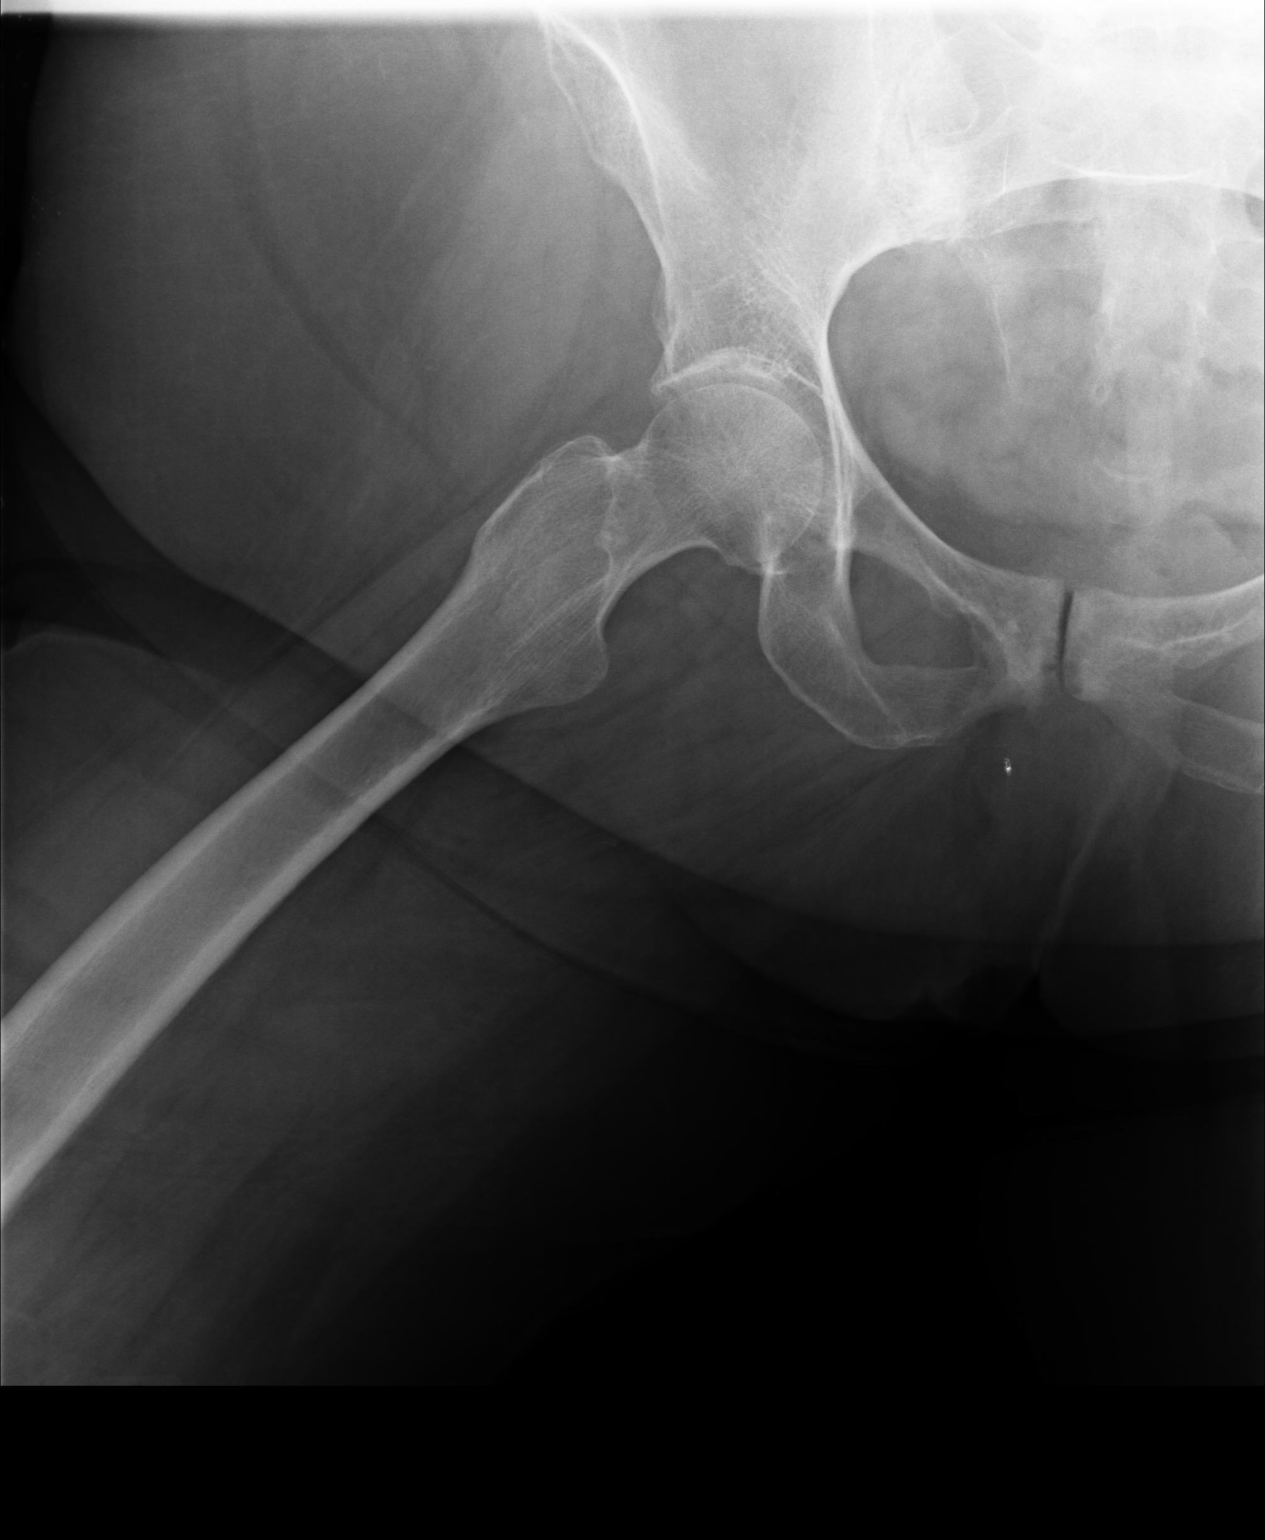

[2 of 2 positions shown; findings below may reference images not displayed]

FINDINGS: There is no acute fracture or subluxation. No significant arthritic changes seen within the hip joints. There is advanced osteoarthritis of the right sacroiliac joint. Surrounding soft tissues are also unremarkable.
IMPRESSION: Unremarkable right hip. Advanced osteoarthritis of the right sacroiliac joint.

## 2019-05-03 IMAGING — MG 3D SCREENING MAMMO BIL W/CAD
5 series · 7 of 24 positions shown · non-contrast
Comparison: 03/01/2020 and 11/18/2018.

------------- REPORT [id number] -------------
[facility]
[address]
Daina Ms.HAERTER, SHAKILL:
We wish to report the following on your recent mammography examination. We are sending a report to your referring physician or other health care provider. 
(       Normal/Negative:
No evidence of cancer.
This statement is mandated by the [place] Department of Health.
Your examination was performed by one of our technologists, who are registered radiological technologists and also specially certified in mammography:
___
Parlak, Edaly (M)
Nepomuceno, Martinez (M)

Your mammogram was interpreted by our radiologist.
( 
Sofeine Made, M.D.
(Annual Breast Examination by a physician or other health care provider
(Annual Mammography Screening beginning at age 40
(Monthly Breast Self Examination
------------- REPORT [id number] -------------
EXAM:  3D BILATERAL ANNUAL SCREENING DIGITAL MAMMOGRAM WITH TOMOSYNTHESIS AND CAD
INDICATION: Screening.

[R CC · right · 0.10mm/px · 2 of 2 slices shown]
[im 1/2]
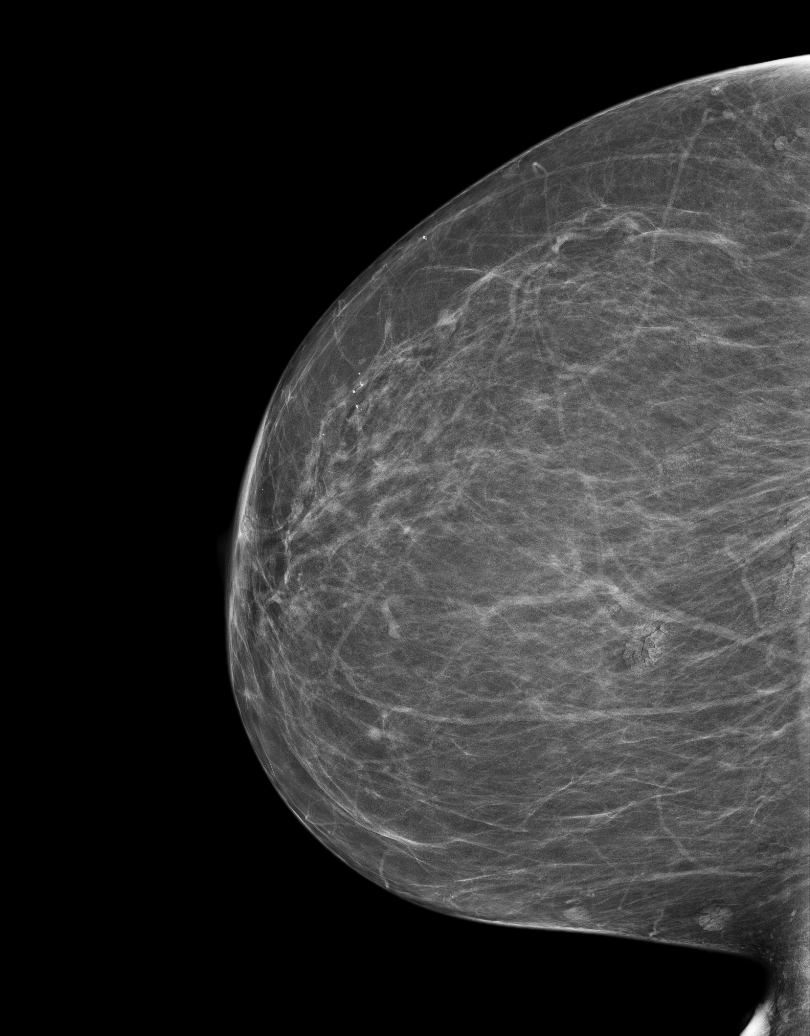
[im 2/2]
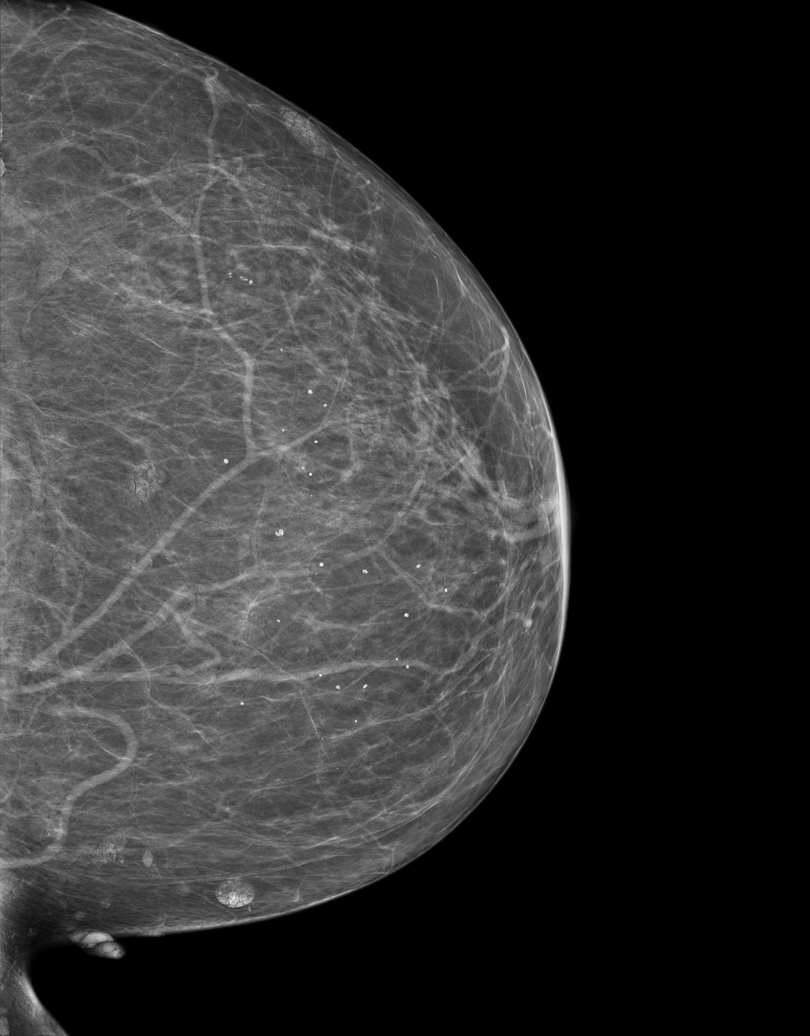

[3D SCREENING MAMMO BIL W/CAD · 2 acquisitions, 2 frames shown (1 of 2)]
[im 1/2]
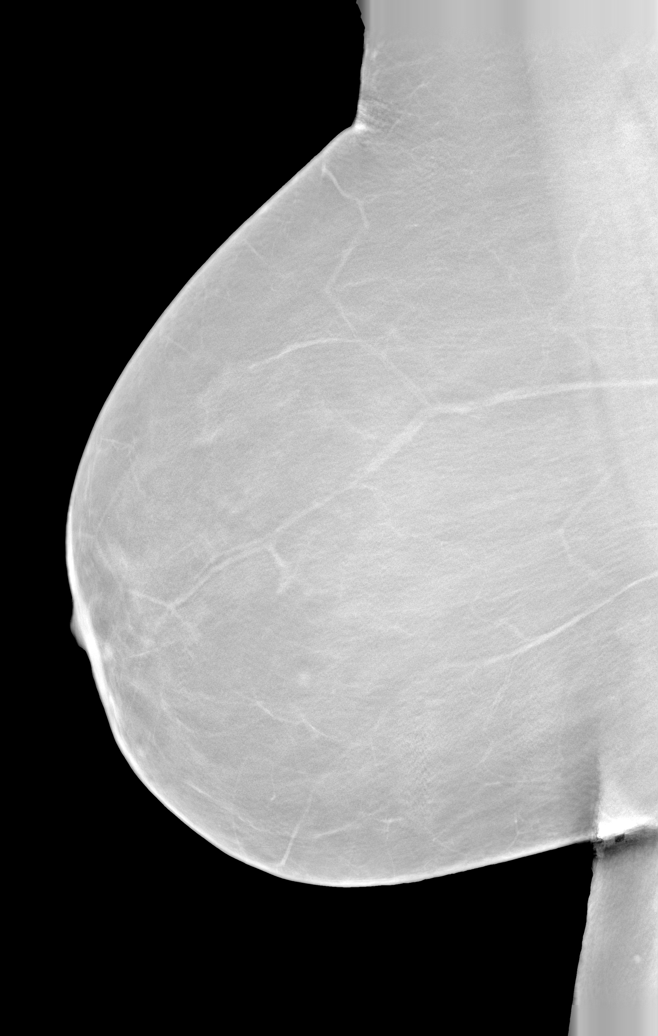
[im 2/2]
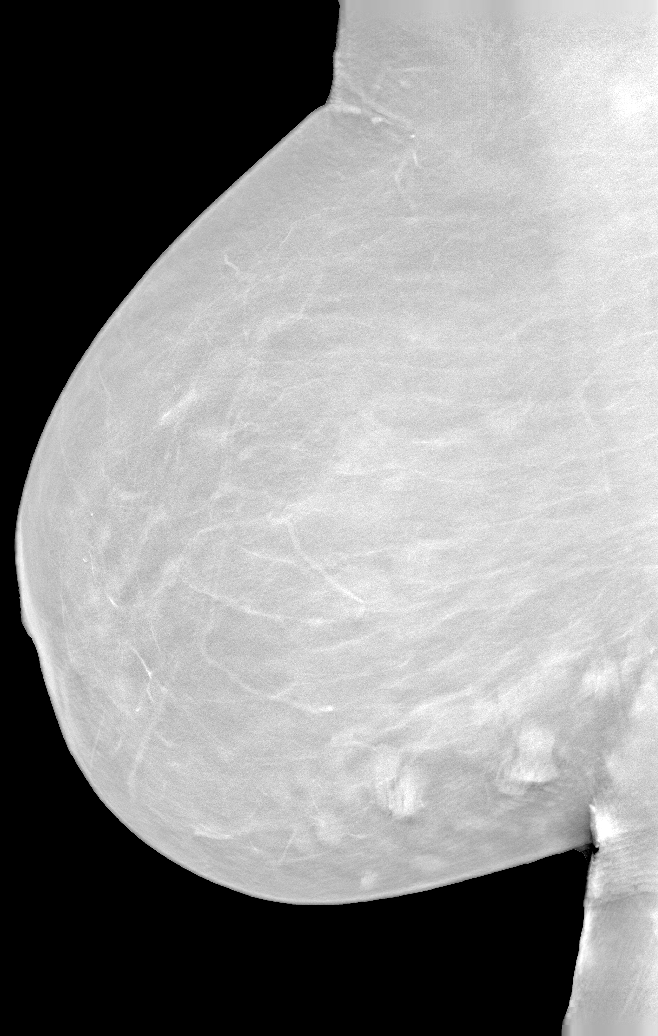

[3D SCREENING MAMMO BIL W/CAD (2 of 2) · tomo slice 12/75.0]
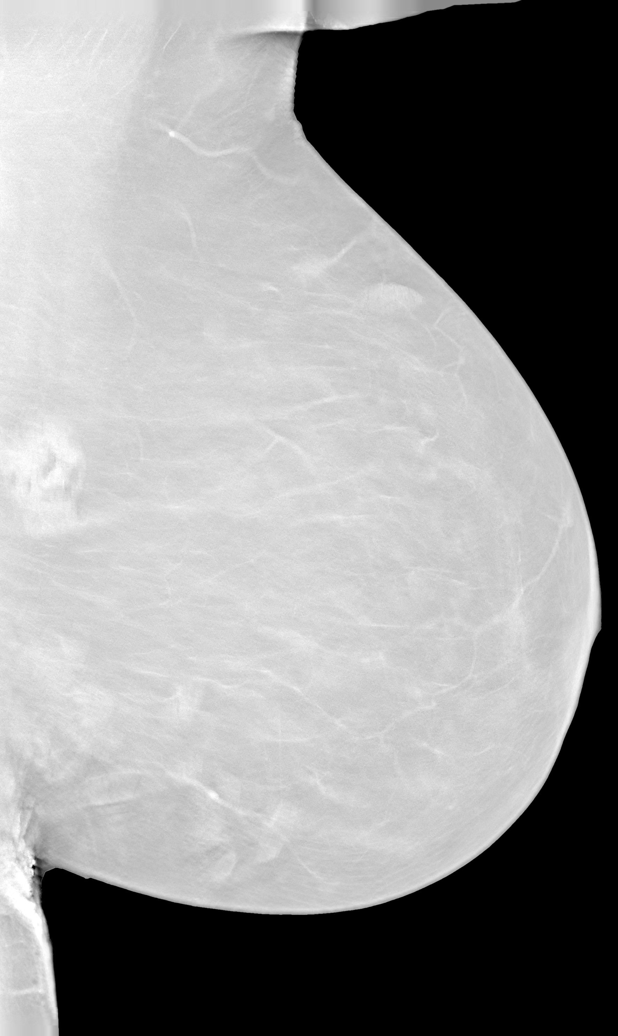

[L]
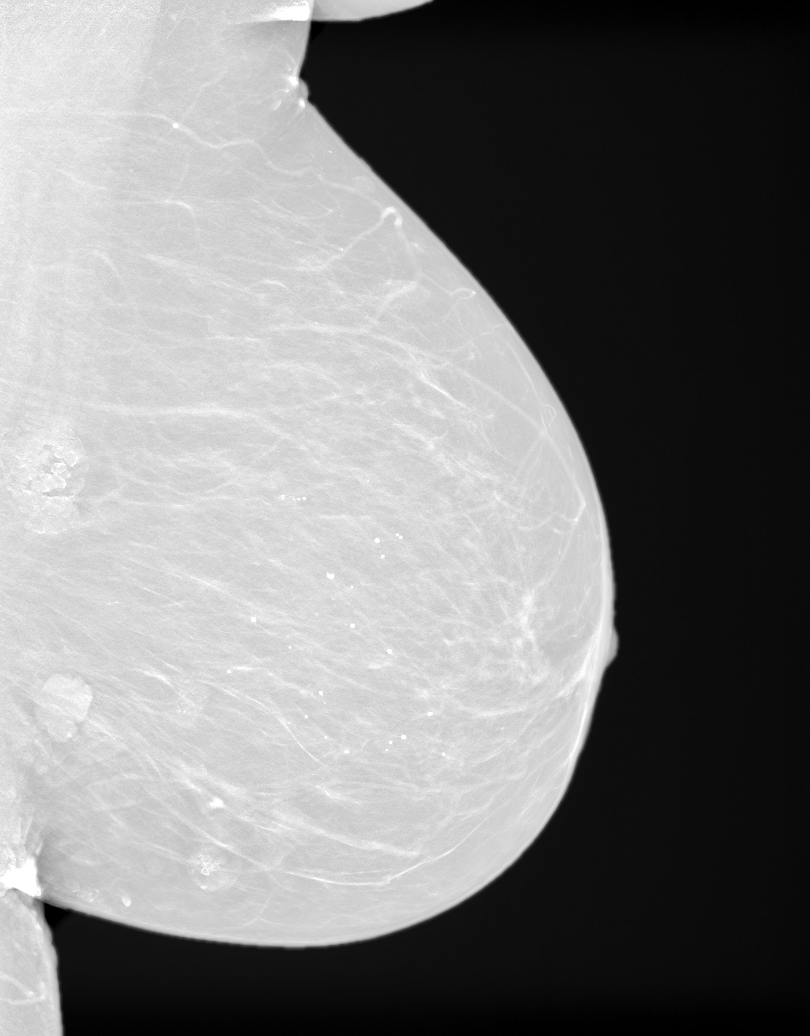

[R]
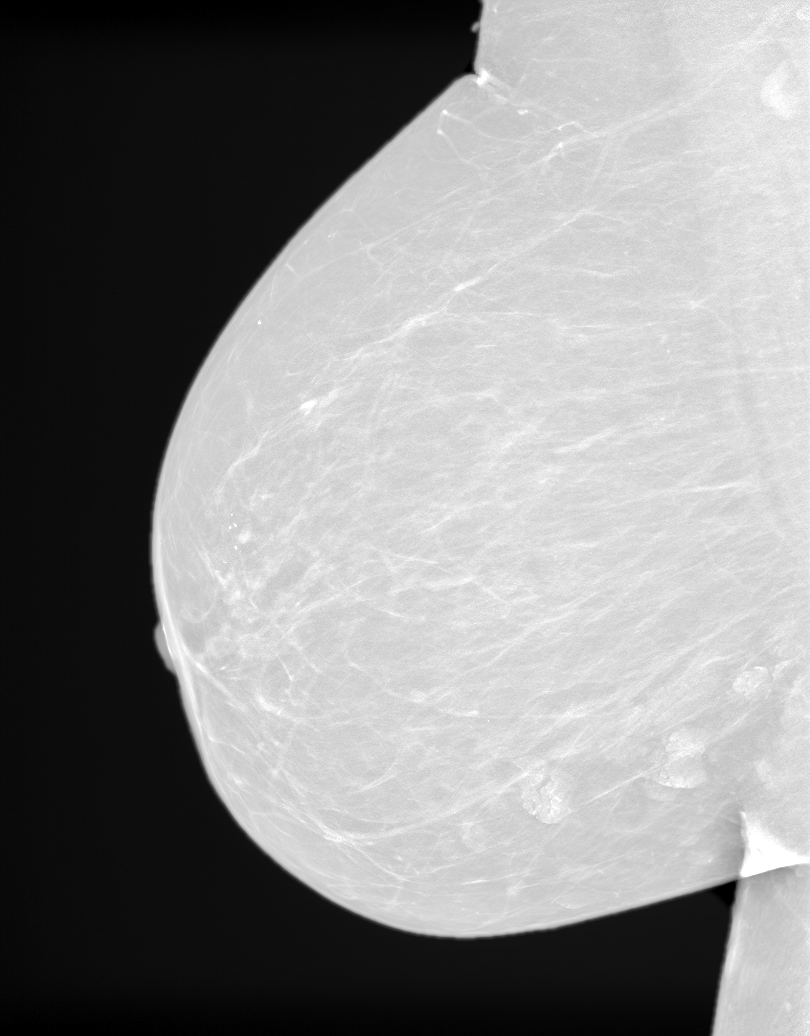

[7 of 24 positions shown; findings below may reference images not displayed]

FINDINGS: Breast parenchyma is heterogeneously dense.  There is no mass or suspicious cluster of microcalcifications.   There is no architectural distortion, skin thickening or nipple retraction.
IMPRESSION: 1.  BIRADS 2-Benign findings. Patient has been added in a reminder system with a target date for the next screening mammography.

2.  DENSITY CODE –  C (Heterogeneously dense).

Final Assessment Code:

Bi-Rads 2 

BI-RADS 0
Need additional imaging evaluation

BI-RADS 1
Negative mammogram

BI-RADS 2
Benign finding

BI-RADS 3
Probably benign finding; short-interval follow-up suggested

BI-RADS 4
Suspicious abnormality; biopsy should be considered

BI-RADS 5
Highly suggestive of malignancy; appropriate action should be taken

BI-RADS 6
Known biopsy-proven malignancy; appropriate action should be taken

NOTE:
In compliance with Federal regulations, the results of this mammogram are being sent to the patient.

## 2020-05-08 IMAGING — MG 3D SCREENING MAMMO BIL W/CAD
5 series · 7 of 24 positions shown · non-contrast
Comparison: 06/12/2020 and 05/25/2019.

------------- REPORT [id number] -------------
﻿

EXAM:  3D BILATERAL ANNUAL SCREENING DIGITAL MAMMOGRAM WITH CAD AND TOMOSYNTHESIS
INDICATION: Screening.

[L]
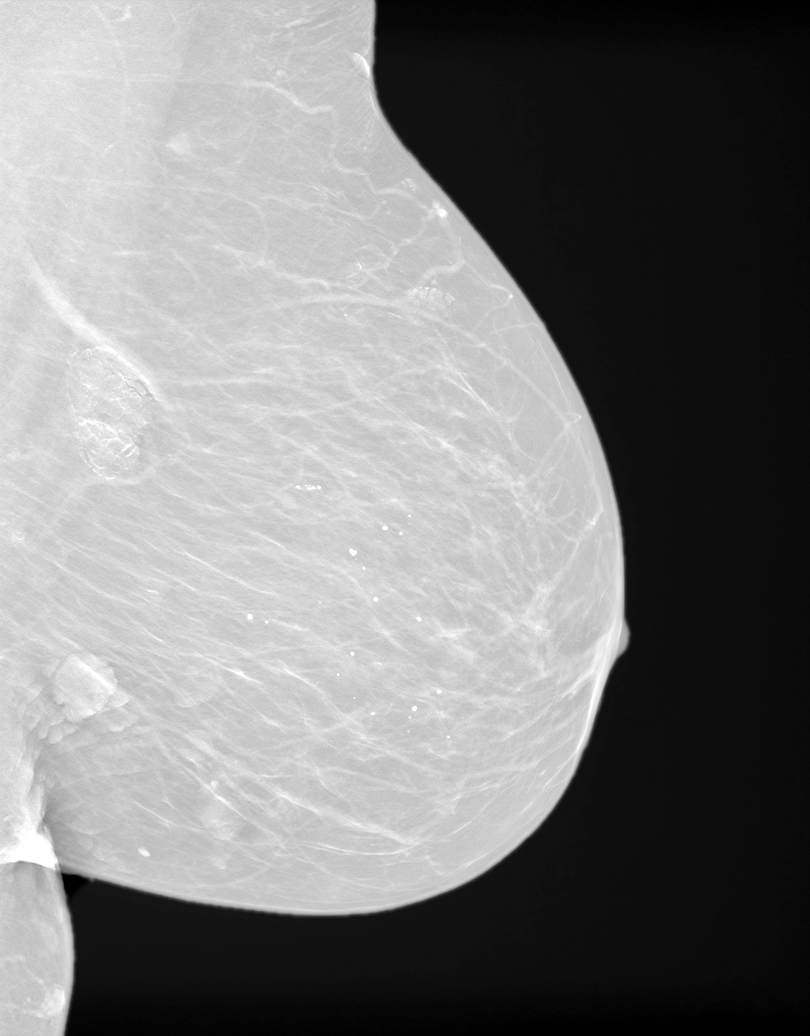

[R]
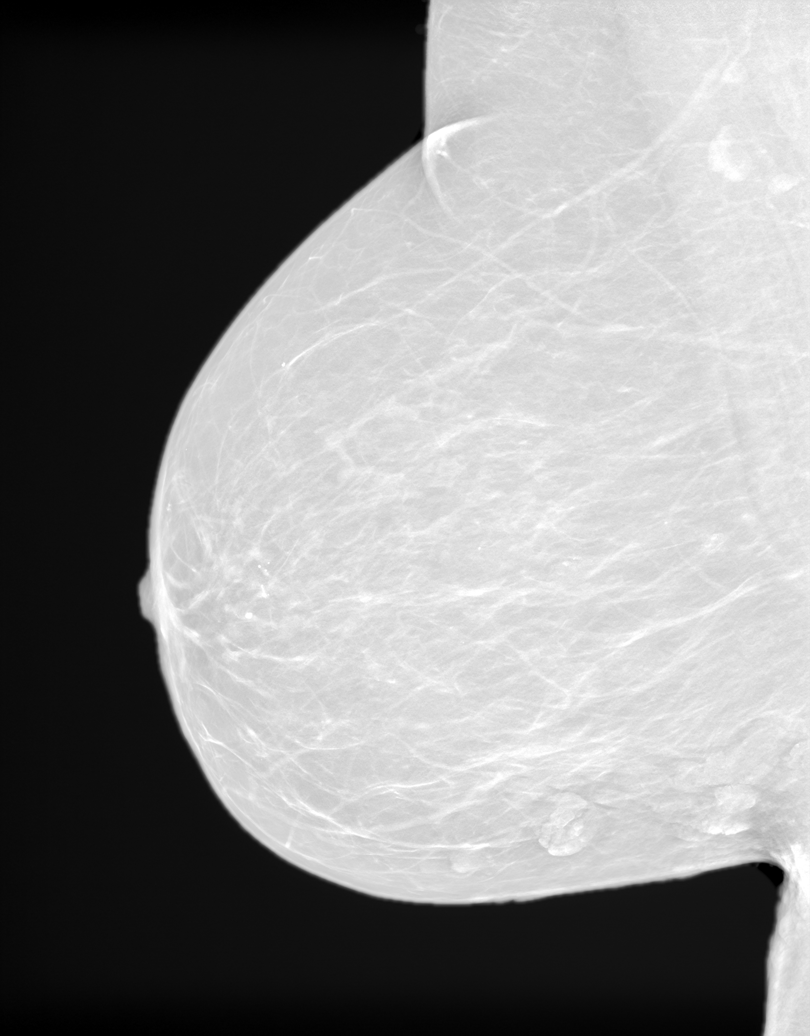

[Series 5438: R CC · right · 0.10mm/px · 2 of 3 slices shown]
[im 1/3]
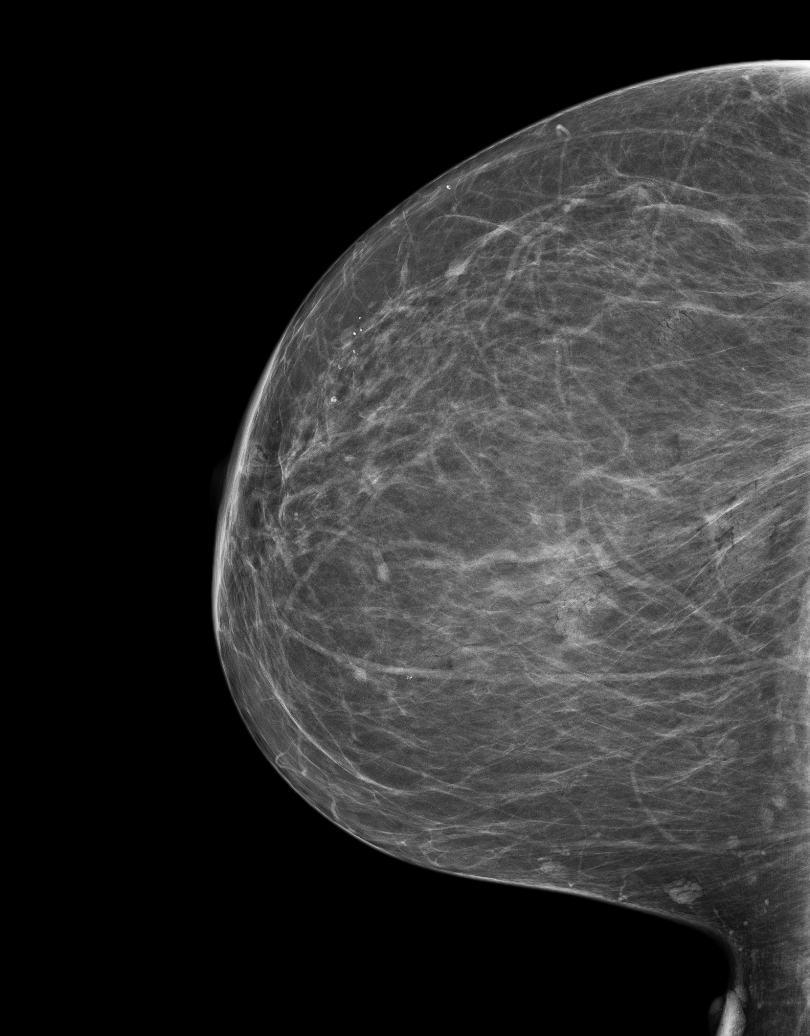
[im 3/3]
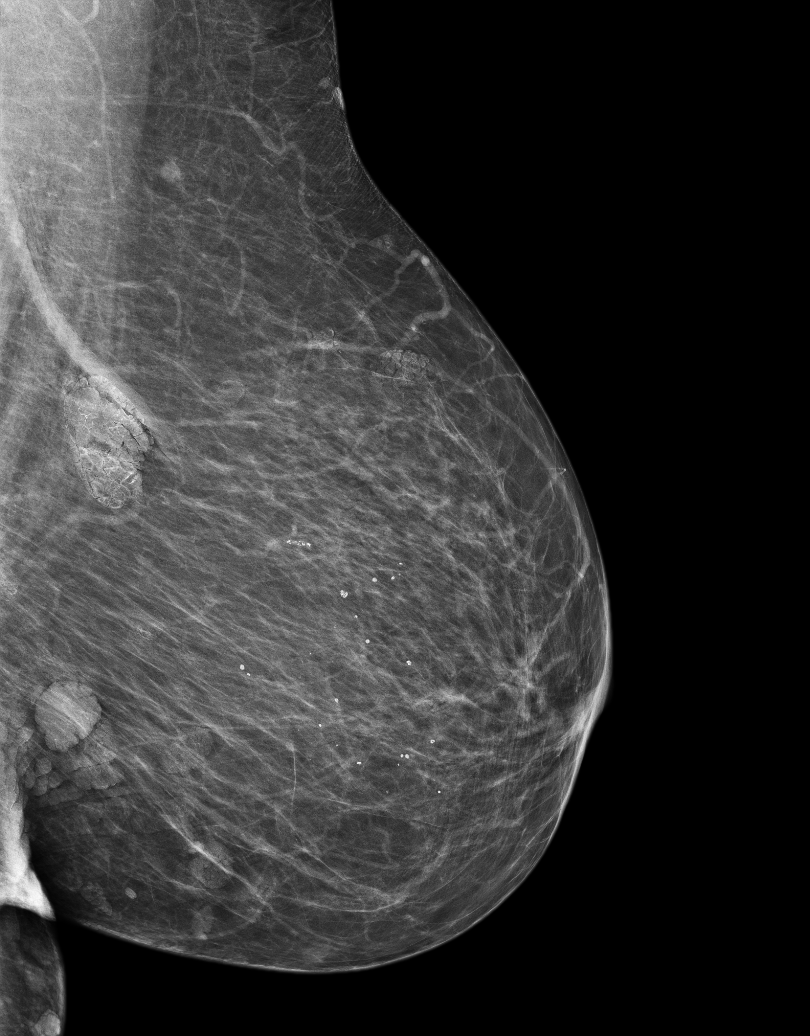

[Series 5440: 3D SCREENING MAMMO BIL W/CAD · 2 acquisitions, 2 frames shown (1 of 2)]
[im 1/2]
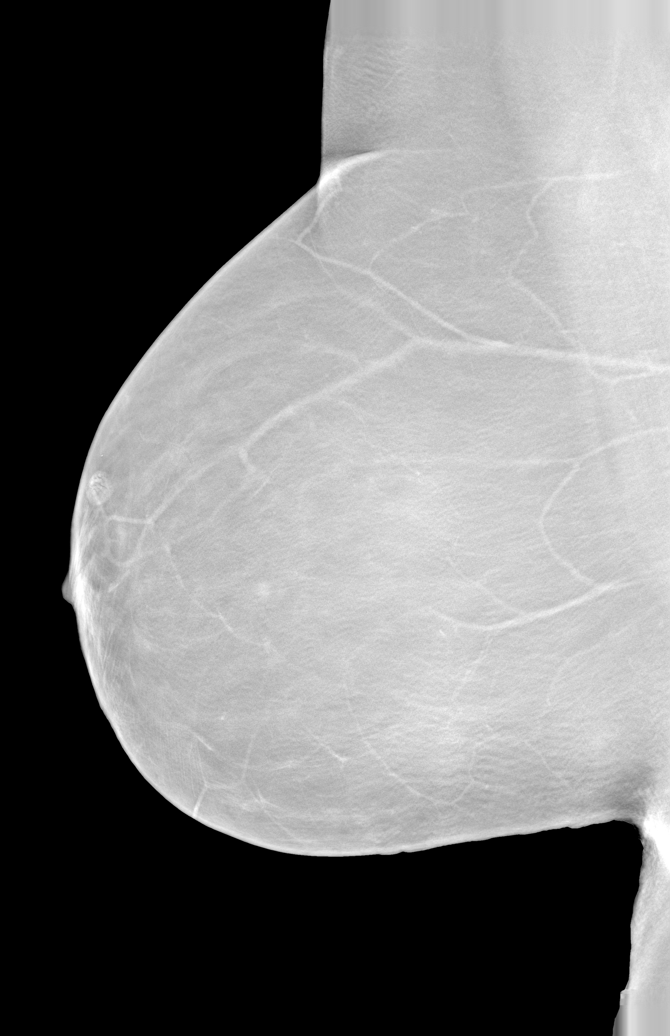
[im 2/2]
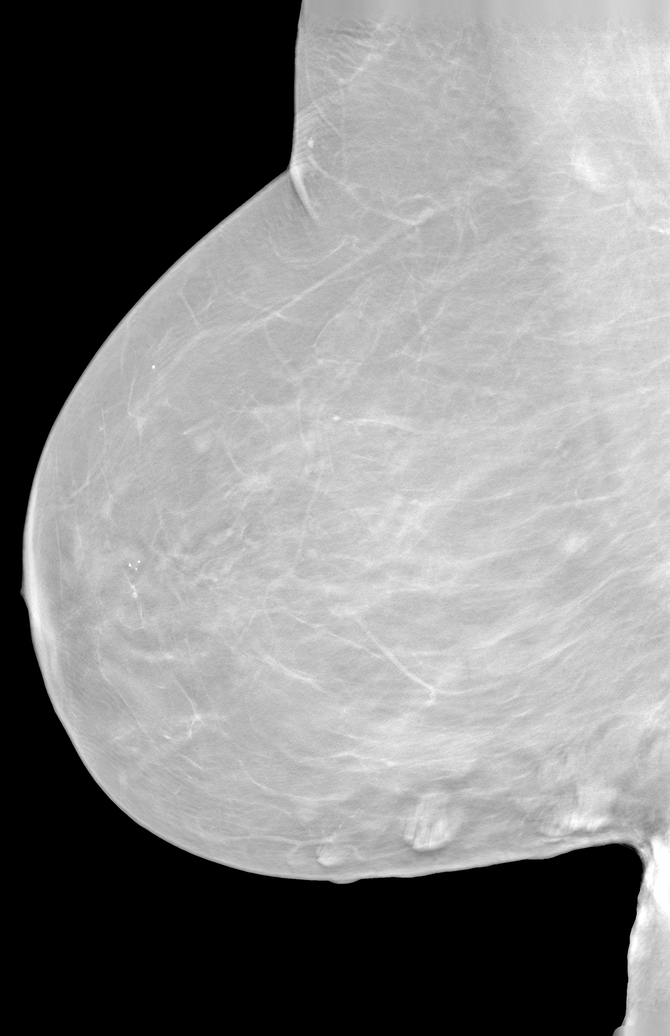

[3D SCREENING MAMMO BIL W/CAD (2 of 2) · tomo slice 11/69.0]
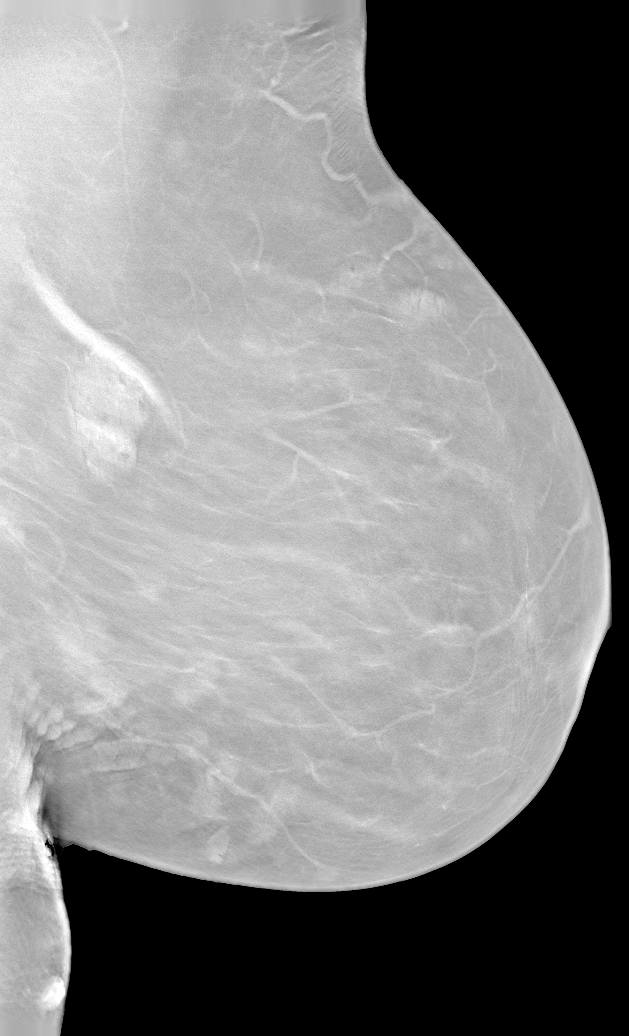

[7 of 24 positions shown; findings below may reference images not displayed]

FINDINGS: There are scattered fibroglandular elements.  There is no mass or suspicious cluster of microcalcifications.   There is no architectural distortion, skin thickening or nipple retraction.
IMPRESSION: 1.  BIRADS 2-Benign findings. Patient has been added in a reminder system with a target date for the next screening mammography.

2.  DENSITY CODE – B (Scattered areas of fibroglandular density).  

Final Assessment Code:

Bi-Rads 2 

BI-RADS 0
Need additional imaging evaluation.

BI-RADS 1
Negative mammogram.

BI-RADS 2
Benign finding.

BI-RADS 3
Probably benign finding; short-interval follow-up suggested.

BI-RADS 4
Suspicious abnormality; biopsy should be considered.

BI-RADS 5
Highly suggestive of malignancy; appropriate action should be taken.

BI-RADS 6
Known biopsy-proven malignancy; appropriate action should be taken.

NOTE:
In compliance with Federal regulations, the results of this mammogram are being sent to the patient.

------------- REPORT [id number] -------------
[facility] [place]
[address]
Tiger Ms.GANDJI, EUGENIE:
We wish to report the following on your recent mammography examination. We are sending a report to your referring physician or other health care provider. 
(       Normal/Negative:
No evidence of cancer.
This statement is mandated by the [place], Department of Health.
Your examination was performed by one of our technologists, who are registered radiological technologists and also specially certified in mammography:
___
Markland, Marjuan (M)

Your mammogram was interpreted by our radiologist.

( 
Collette Sedman, M.D.

(Annual Breast Examination by a physician or other health care provider
(Annual Mammography Screening beginning at age 40
(Monthly Breast Self Examination

## 2021-04-17 ENCOUNTER — Ambulatory Visit (INDEPENDENT_AMBULATORY_CARE_PROVIDER_SITE_OTHER): Payer: Self-pay | Admitting: Hematology & Oncology

## 2021-05-09 IMAGING — MG 3D SCREENING MAMMO BIL AND TOMO
5 series · 7 of 24 positions shown · non-contrast
Comparison: 07/04/2022 and 06/28/2021.

------------- REPORT [id number] -------------
﻿

EXAM:  3D BILATERAL ANNUAL SCREENING DIGITAL MAMMOGRAM WITH TOMOSYNTHESIS
INDICATION: Screening.

[R]
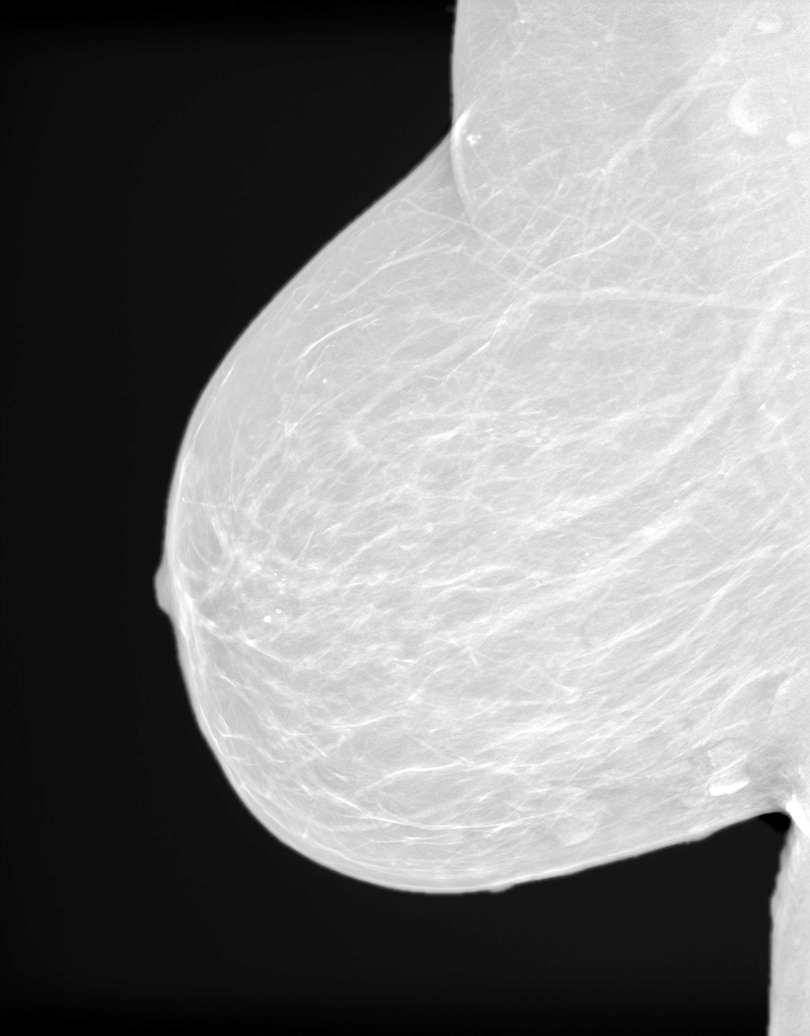

[L]
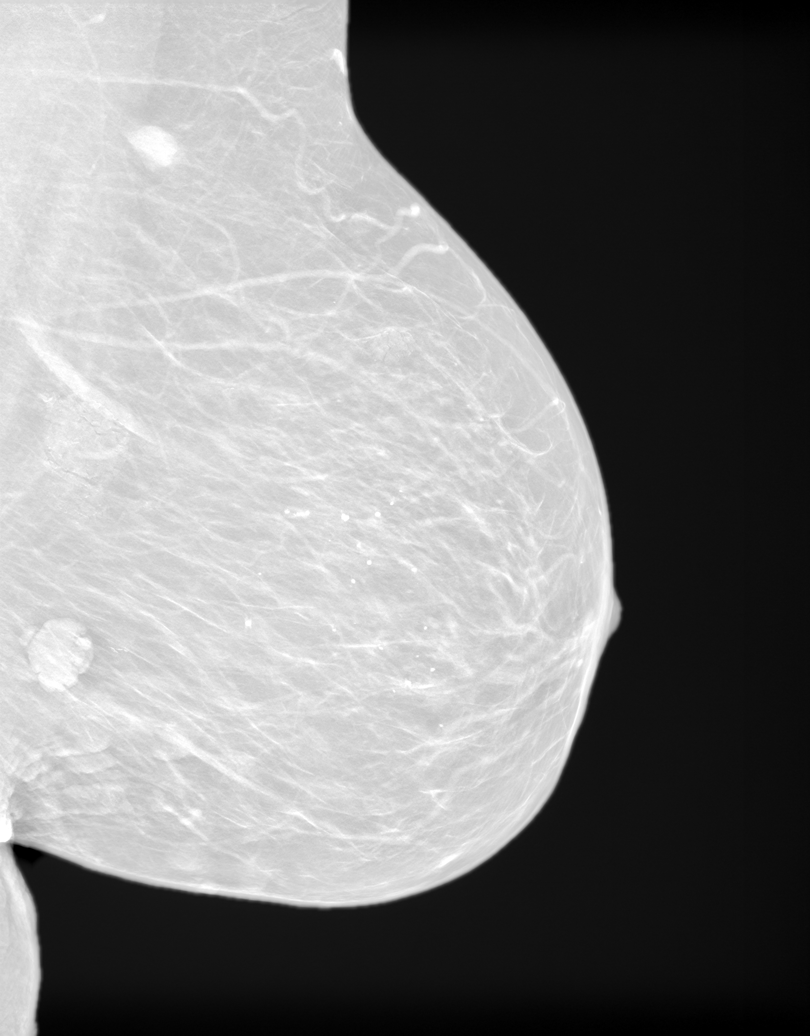

[R CC tomo · right · 0.10mm/px · 2 of 2 slices shown]
[im 1/2]
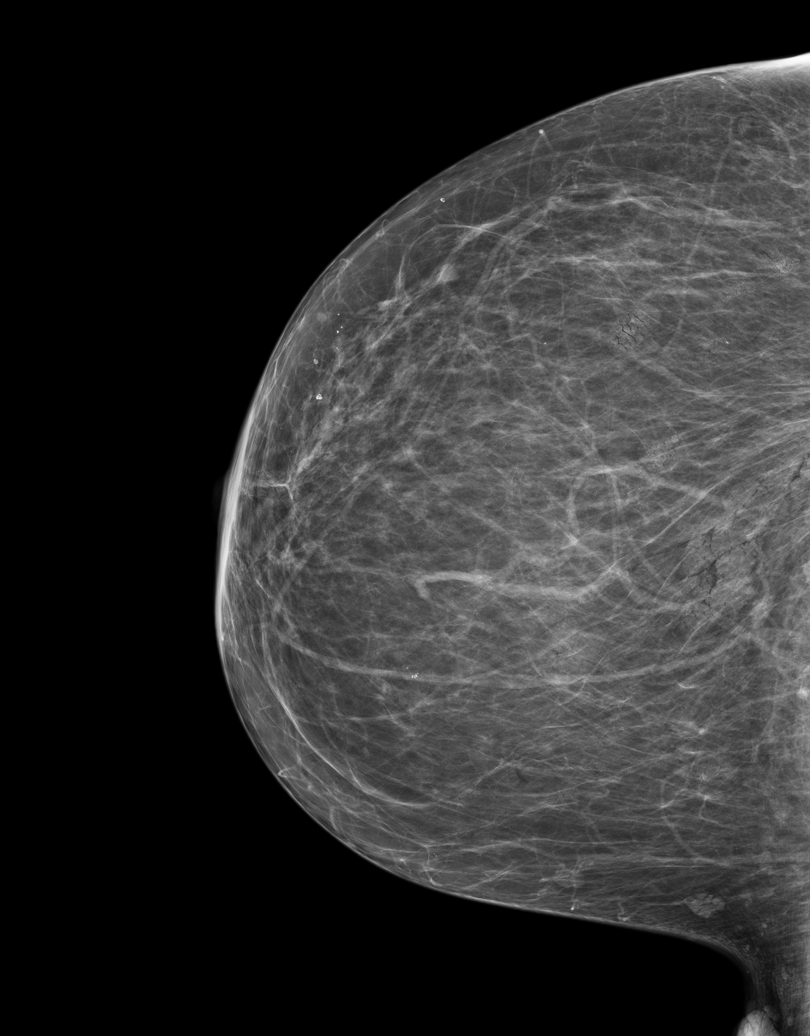
[im 2/2]
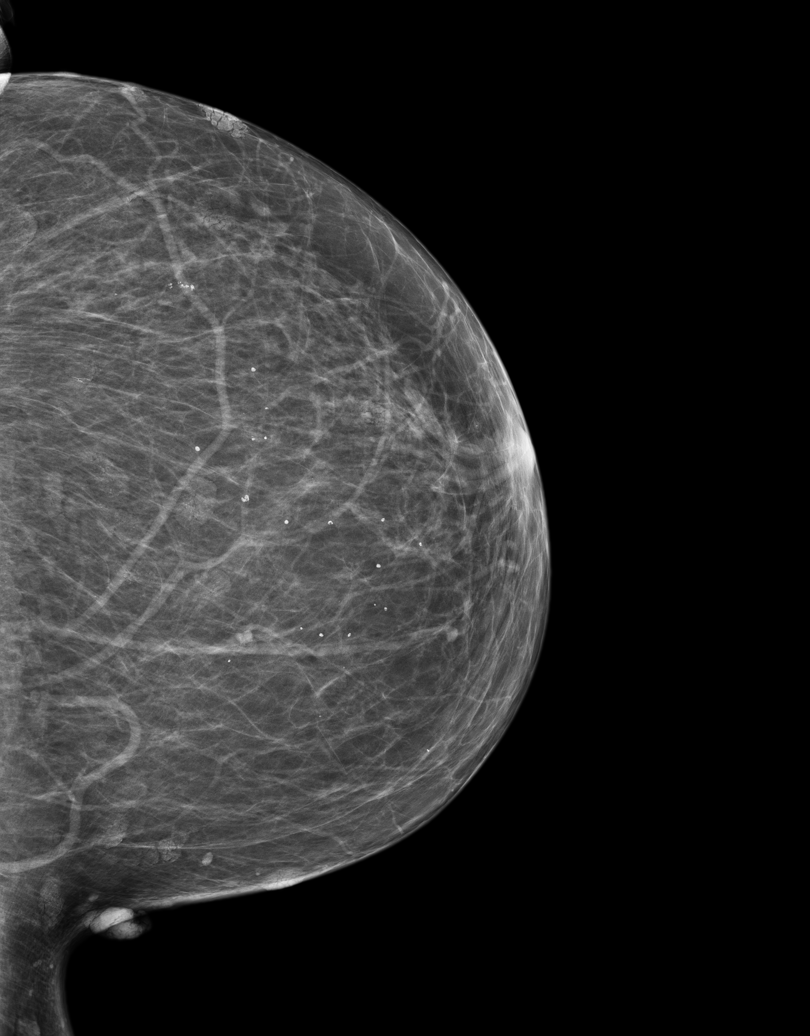

[3D SCREENING MAMMO BIL AND TOMO tomo · 2 acquisitions, 2 frames shown (1 of 2)]
[im 1/2]
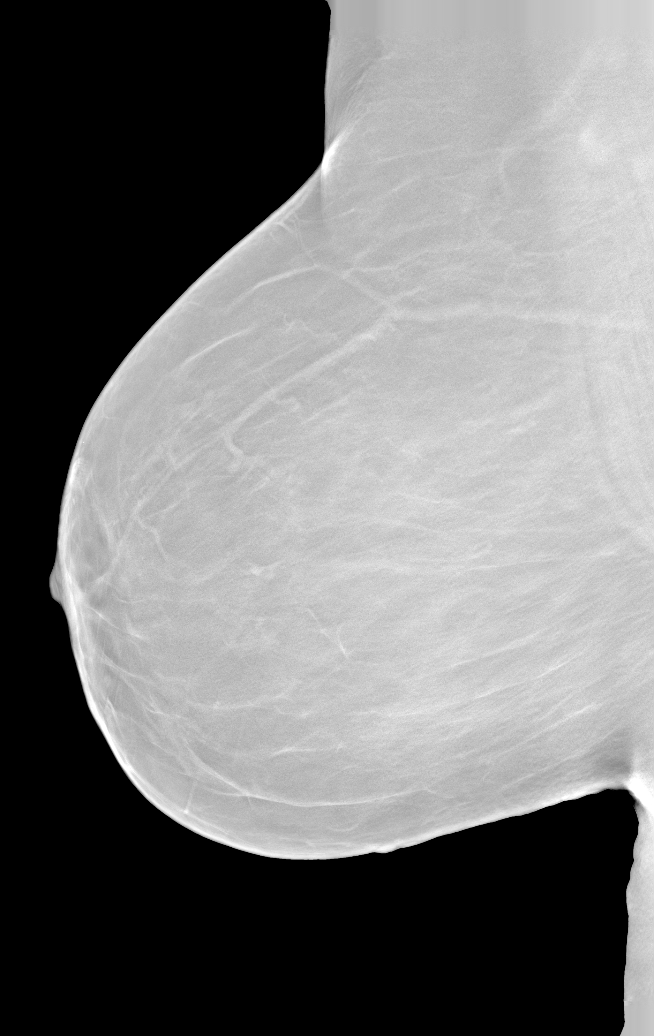
[im 2/2]
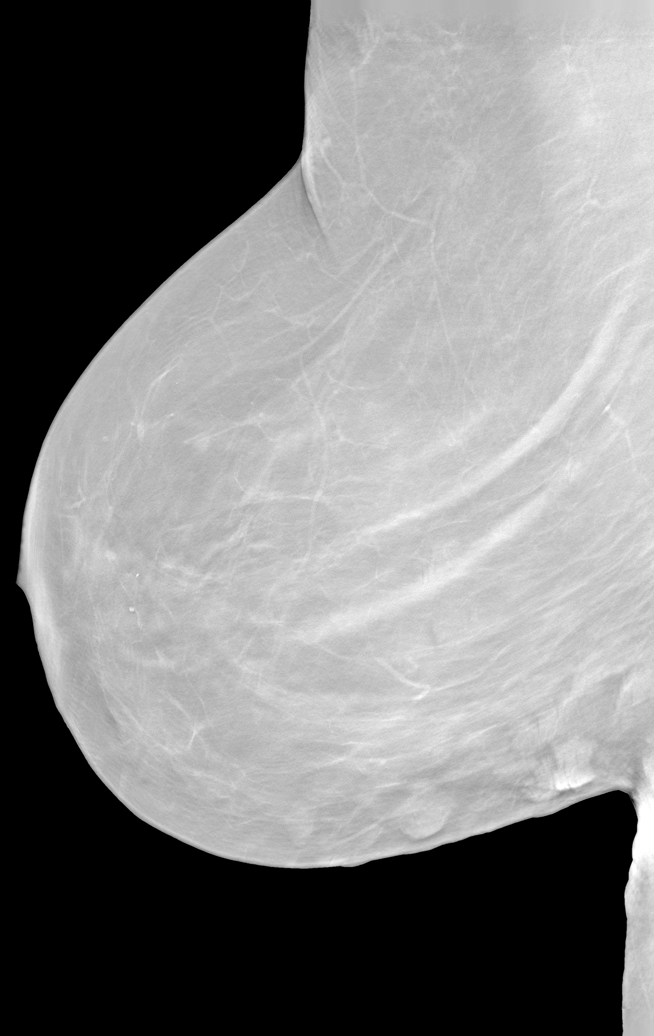

[3D SCREENING MAMMO BIL AND TOMO tomo (2 of 2) · tomo slice 9/57.0]
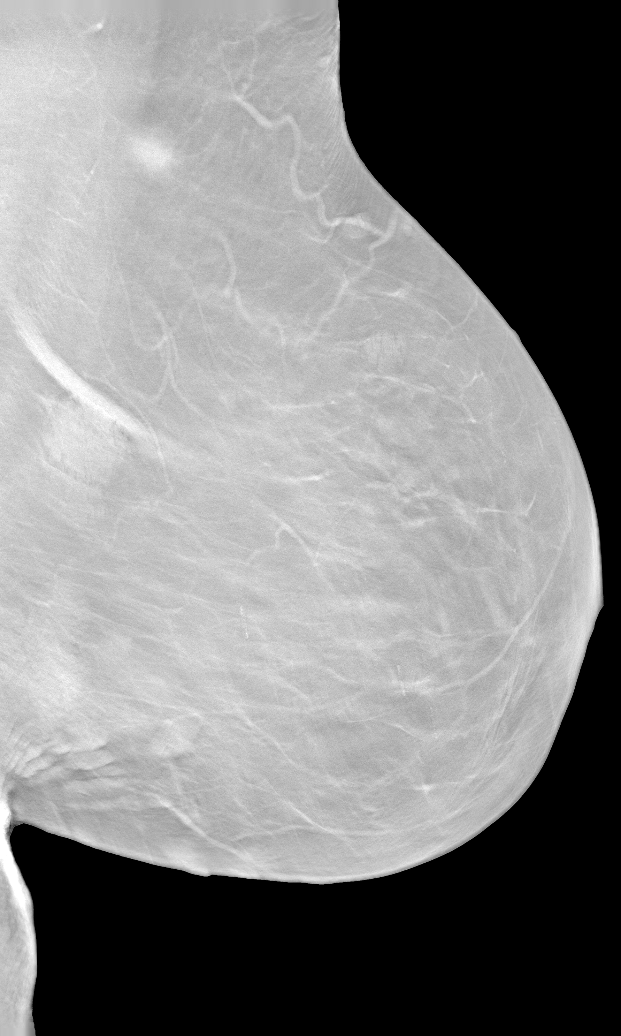

[7 of 24 positions shown; findings below may reference images not displayed]

FINDINGS: There are scattered fibroglandular elements.  There is no mass or suspicious cluster of microcalcifications.   There is no architectural distortion, skin thickening or nipple retraction.
IMPRESSION: 1.  BIRADS 2-Benign findings. Patient has been added in a reminder system with a target date for the next screening mammography.

2.  DENSITY CODE – B (Scattered areas of fibroglandular density). 

Final Assessment Code:

Bi-Rads 2 

BI-RADS 0
 Need additional imaging evaluation.

BI-RADS 1
 Negative mammogram.

BI-RADS 2
 Benign finding.

BI-RADS 3
 Probably benign finding; short-interval follow-up suggested.

BI-RADS 4
 Suspicious abnormality; biopsy should be considered.

BI-RADS 5
 Highly suggestive of malignancy; appropriate action should be taken.

BI-RADS 6
 Known biopsy-proven malignancy; appropriate action should be taken.

NOTE:
In compliance with Federal regulations, the results of this mammogram are being sent to the patient.

------------- REPORT [id number] -------------
[facility] [place]
[address]
Tiger Ms.GANDJI, EUGENIE:
We wish to report the following on your recent mammography examination. We are sending a report to your referring physician or other health care provider. 
(       Normal/Negative:
No evidence of cancer.
This statement is mandated by the [place], Department of Health.
Your examination was performed by one of our technologists, who are registered radiological technologists and also specially certified in mammography:
___
Markland, Marjuan (M)

Your mammogram was interpreted by our radiologist.

( 
Collette Sedman, M.D.

(Annual Breast Examination by a physician or other health care provider
(Annual Mammography Screening beginning at age 40
(Monthly Breast Self Examination

## 2021-06-29 ENCOUNTER — Emergency Department (HOSPITAL_COMMUNITY): Payer: Medicare Other

## 2021-06-29 ENCOUNTER — Encounter (HOSPITAL_COMMUNITY): Payer: Self-pay

## 2021-06-29 ENCOUNTER — Emergency Department
Admission: EM | Admit: 2021-06-29 | Discharge: 2021-06-29 | Disposition: A | Discharge status: Home / Self Care | Payer: Medicare Other | Attending: Emergency Medicine | Admitting: Emergency Medicine

## 2021-06-29 ENCOUNTER — Other Ambulatory Visit: Payer: Self-pay

## 2021-06-29 DIAGNOSIS — J1282 Pneumonia due to coronavirus disease 2019: Secondary | ICD-10-CM | POA: Insufficient documentation

## 2021-06-29 DIAGNOSIS — R110 Nausea: Secondary | ICD-10-CM | POA: Insufficient documentation

## 2021-06-29 DIAGNOSIS — D509 Iron deficiency anemia, unspecified: Secondary | ICD-10-CM | POA: Insufficient documentation

## 2021-06-29 DIAGNOSIS — E785 Hyperlipidemia, unspecified: Secondary | ICD-10-CM | POA: Insufficient documentation

## 2021-06-29 DIAGNOSIS — K219 Gastro-esophageal reflux disease without esophagitis: Secondary | ICD-10-CM | POA: Insufficient documentation

## 2021-06-29 DIAGNOSIS — Z8639 Personal history of other endocrine, nutritional and metabolic disease: Secondary | ICD-10-CM | POA: Insufficient documentation

## 2021-06-29 DIAGNOSIS — R059 Cough, unspecified: Secondary | ICD-10-CM | POA: Insufficient documentation

## 2021-06-29 DIAGNOSIS — I509 Heart failure, unspecified: Secondary | ICD-10-CM | POA: Insufficient documentation

## 2021-06-29 DIAGNOSIS — Z8616 Personal history of COVID-19: Secondary | ICD-10-CM | POA: Insufficient documentation

## 2021-06-29 DIAGNOSIS — M810 Age-related osteoporosis without current pathological fracture: Secondary | ICD-10-CM | POA: Insufficient documentation

## 2021-06-29 DIAGNOSIS — U071 COVID-19: Secondary | ICD-10-CM | POA: Insufficient documentation

## 2021-06-29 DIAGNOSIS — E119 Type 2 diabetes mellitus without complications: Secondary | ICD-10-CM | POA: Insufficient documentation

## 2021-06-29 DIAGNOSIS — R0602 Shortness of breath: Secondary | ICD-10-CM | POA: Insufficient documentation

## 2021-06-29 DIAGNOSIS — Z85118 Personal history of other malignant neoplasm of bronchus and lung: Secondary | ICD-10-CM | POA: Insufficient documentation

## 2021-06-29 DIAGNOSIS — J189 Pneumonia, unspecified organism: Secondary | ICD-10-CM

## 2021-06-29 LAB — COMPREHENSIVE METABOLIC PANEL, NON-FASTING
ALBUMIN/GLOBULIN RATIO: 1.2 (ref 0.8–1.4)
ALBUMIN: 3.8 g/dL (ref 3.5–5.7)
ALKALINE PHOSPHATASE: 106 U/L — ABNORMAL HIGH (ref 34–104)
ALT (SGPT): 17 U/L (ref 7–52)
ANION GAP: 6 mmol/L — ABNORMAL LOW (ref 10–20)
AST (SGOT): 19 U/L (ref 13–39)
BILIRUBIN TOTAL: 0.6 mg/dL (ref 0.3–1.2)
BUN/CREA RATIO: 15 (ref 6–22)
BUN: 16 mg/dL (ref 7–25)
CALCIUM, CORRECTED: 10.4 mg/dL (ref 8.9–10.8)
CALCIUM: 10.2 mg/dL (ref 8.6–10.3)
CHLORIDE: 102 mmol/L (ref 98–107)
CO2 TOTAL: 29 mmol/L (ref 21–31)
CREATININE: 1.06 mg/dL (ref 0.60–1.30)
ESTIMATED GFR: 54 mL/min/{1.73_m2} — ABNORMAL LOW (ref >59)
GLOBULIN: 3.1 (ref 2.9–5.4)
GLUCOSE: 159 mg/dL — ABNORMAL HIGH (ref 74–109)
OSMOLALITY, CALCULATED: 278 mOsm/kg (ref 270–290)
POTASSIUM: 4 mmol/L (ref 3.5–5.1)
PROTEIN TOTAL: 6.9 g/dL (ref 6.4–8.9)
SODIUM: 137 mmol/L (ref 136–145)

## 2021-06-29 LAB — LACTIC ACID LEVEL W/ REFLEX FOR LEVEL >2.0: LACTIC ACID: 2.4 mmol/L — ABNORMAL HIGH (ref 0.5–2.2)

## 2021-06-29 LAB — CBC WITH DIFF
BASOPHIL #: 0 10*3/uL (ref 0.00–0.30)
BASOPHIL %: 0 % (ref 0–3)
EOSINOPHIL #: 0 10*3/uL (ref 0.00–0.80)
EOSINOPHIL %: 0 % (ref 0–7)
HCT: 36 % — ABNORMAL LOW (ref 37.0–47.0)
HGB: 12.2 g/dL — ABNORMAL LOW (ref 12.5–16.0)
LYMPHOCYTE #: 1.3 10*3/uL (ref 1.10–5.00)
LYMPHOCYTE %: 14 % — ABNORMAL LOW (ref 25–45)
MCH: 29.6 pg (ref 27.0–32.0)
MCHC: 34 g/dL (ref 32.0–36.0)
MCV: 87.2 fL (ref 78.0–99.0)
MONOCYTE #: 0.6 10*3/uL (ref 0.00–1.30)
MONOCYTE %: 7 % (ref 0–12)
MPV: 9.7 fL (ref 7.4–10.4)
NEUTROPHIL #: 7.4 10*3/uL (ref 1.80–8.40)
NEUTROPHIL %: 79 % — ABNORMAL HIGH (ref 40–76)
PLATELETS: 139 10*3/uL — ABNORMAL LOW (ref 140–440)
RBC: 4.13 10*6/uL — ABNORMAL LOW (ref 4.20–5.40)
RDW: 14 % (ref 11.6–14.8)
WBC: 9.4 10*3/uL (ref 4.0–10.5)
WBCS UNCORRECTED: 9.4 10*3/uL

## 2021-06-29 LAB — COVID-19, FLU A/B, RSV RAPID BY PCR
INFLUENZA VIRUS TYPE A: NOT DETECTED
INFLUENZA VIRUS TYPE B: NOT DETECTED
RESPIRATORY SYNCTIAL VIRUS (RSV): NOT DETECTED
SARS-CoV-2: DETECTED — AB

## 2021-06-29 LAB — LAVENDER TOP TUBE

## 2021-06-29 LAB — BLUE TOP TUBE

## 2021-06-29 LAB — LIGHT GREEN TOP TUBE

## 2021-06-29 LAB — GOLD TOP TUBE

## 2021-06-29 MED ORDER — SODIUM CHLORIDE 0.9 % INTRAVENOUS PIGGYBACK
1.0000 g | INTRAVENOUS | Status: AC
Start: 2021-06-29 — End: 2021-06-29
  Administered 2021-06-29: 0 g via INTRAVENOUS
  Administered 2021-06-29: 1 g via INTRAVENOUS

## 2021-06-29 MED ORDER — CEFTRIAXONE 1 GRAM SOLUTION FOR INJECTION
INTRAMUSCULAR | Status: AC
Start: 2021-06-29 — End: 2021-06-29
  Filled 2021-06-29: qty 10

## 2021-06-29 MED ORDER — SODIUM CHLORIDE 0.9 % INTRAVENOUS PIGGYBACK
INJECTION | INTRAVENOUS | Status: AC
Start: 2021-06-29 — End: 2021-06-29
  Filled 2021-06-29: qty 50

## 2021-06-29 MED ORDER — AZITHROMYCIN 250 MG TABLET
ORAL_TABLET | ORAL | 0 refills | Status: DC
Start: 2021-06-29 — End: 2022-06-01

## 2021-06-29 MED ORDER — AZITHROMYCIN 250 MG TABLET
500.0000 mg | ORAL_TABLET | ORAL | Status: AC
Start: 2021-06-29 — End: 2021-06-29
  Administered 2021-06-29: 500 mg via ORAL

## 2021-06-29 MED ORDER — CEFUROXIME AXETIL 500 MG TABLET
500.0000 mg | ORAL_TABLET | Freq: Two times a day (BID) | ORAL | 0 refills | Status: AC
Start: 2021-06-29 — End: 2021-07-09

## 2021-06-29 MED ORDER — AZITHROMYCIN 250 MG TABLET
ORAL_TABLET | ORAL | Status: AC
Start: 2021-06-29 — End: 2021-06-29
  Filled 2021-06-29: qty 2

## 2021-06-29 MED ORDER — PAXLOVID 300 MG (150 MG X 2)-100 MG TABLETS IN A DOSE PACK
3.0000 | ORAL_TABLET | Freq: Two times a day (BID) | ORAL | 0 refills | Status: AC
Start: 2021-06-29 — End: 2021-07-04

## 2021-06-29 NOTE — ED Nurses Note (Signed)
Patient discharged home.  AVS reviewed with patient.  A written copy of the AVS and discharge instructions was given to the patient.  Questions sufficiently answered as needed.  Patient encouraged to follow up with PCP as indicated.  In the event of an emergency, patient/care giver instructed to call 911 or go to the nearest emergency room. IV removed and pressure dressing applied. Pt left department ambulatory.

## 2021-06-29 NOTE — Discharge Instructions (Addendum)
Ceftin twice a day for 10 days --start on Saturday 06/30/2021  Azithromycin 250 mg once a day--start on Saturday 06/30/2021  You have already received day 1 ==500 mg  Paxlovid as directed   See your PCP on Monday   Return to the emergency department as needed or she have any problems.  Continue all your normal medications at home.  Take probiotics twice a day while on antibiotics

## 2021-06-29 NOTE — ED Provider Notes (Signed)
[facility]  ED Primary Provider Note  Patient Name: Kelsey Preston  Patient Age: 81 y.o.  Date of Birth: [birth year]    Chief Complaint: Flu Like Symptoms        History of Present Illness       Kelsey Preston is a 81 y.o. female who had concerns including Flu Like Symptoms.     HPI   Chief complaint cough     HPI  Symptoms patient has significant flu-like symptoms.  She has body aches.  She has chills but no fever.  She does have a significant cough and had trouble breathing last night due to the coughing.  However, she has no overt shortness of breath.  She does have dyspnea with exertion but no orthopnea.  The patient has nausea but no vomiting.  The patient has no diarrhea but she states she has loose stools.  The patient denies any abdominal pain.  She has had no syncope or presyncopal episodes.  The patient has no chest pain or palpitations.  The patient has had no dysuria or frequency.  She has no sputum   Onset acute  Timing 3 days   Context:  Her PCP gave her a steroid shot this week.    Mechanism:  She has had no new sick contacts that she knows of   Duration constant  Severity: Moderate  Social History: Reviewed and listed below    Past Medical History:Reviewed and listed in the chart below.  History of lung cancer.  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    Past Surgical History: Reviewed and listed on the chart below.  Lobectomy for lung cancer.  She has never had any radiation or chemotherapy. Cardiac cath by A. McLuckie.    Family History: Reviewed and listed below    Allergies: Reviewed and listed on the chart    Medications: Reviewed and listed on the chart.    The nursing notes were reviewed and agreed upon unless otherwise stated    The vital signs were reviewed and are listed on the chart    Review of Systems       ROS: No other overt Review of  Systems are noted to be positive except noted in the HPI.        Physical Exam   ED Triage Vitals [06/29/21 1206]   BP (Non-Invasive) (!) 147/85   Heart Rate (!) 107   Respiratory Rate 18   Temperature 36.8 C (98.2 F)   SpO2 97 %   Weight 99.8 kg (220 lb)   Height 1.651 m ('5\' 5"' )         Physical Exam   Constitutional/general:     Very pleasant 81 year old female who is in no acute distress.  She does not appear to be septic and she is breathing okay.  HEENT: Eyes show normal extraocular movements.  Well-hydrated oral mucosa is noted.  There is no facial trauma or abnormalities.  Neck: No midline tenderness, no meningeal signs.  Full range of motion.  No JVD.  Cardiovascular: Heart is regular rate and rhythm S1-S2 sounds were auscultated without murmur click or rub  Respiratory:  Decreased lung auscultation the right lower lobe.  She has mild rhonchi in the right lower lobe but no other lung abnormalities with auscultation.  She speaks in full complete sentences.  She has no wheeze, rale or rhonchi with the exception of  the rhonchi in the right lower lobe.  GI: Abdomen is soft non tender normal bowel sounds are auscultated.  There is no rebound tenderness or guarding.  Mildly obese abdomen BMI of 36.   Neuro cranial nerves II through XII are intact and normal.  Patient has normal speech and normal gait.  There is no muscle weakness in any extremities.  Sensation is intact throughout.    Psych: Patient is alert and oriented person place and time.  Patient is very pleasant converse with has a euthymic affect.  There are no signs of depression or anxiety.  Skin: No rash.  No petechiae or purpura.  The skin is warm and dry without diaphoresis.  There is no pallor.  Patient has excessive skin tags.  Musculoskeletal: There is no tenderness to palpation in any body region.  There is no lower extremity edema and no calf tenderness.  Negative Homans sign bilaterally.          Patient Data     Labs Ordered/Reviewed    COMPREHENSIVE METABOLIC PANEL, NON-FASTING - Abnormal; Notable for the following components:       Result Value    ANION GAP 6 (*)     ESTIMATED GFR 54 (*)     GLUCOSE 159 (*)     ALKALINE PHOSPHATASE 106 (*)     All other components within normal limits    Narrative:     Estimated Glomerular Filtration Rate (eGFR) is calculated using the CKD-EPI (2021) equation, intended for patients 74 years of age and older. If gender is not documented or "unknown", there will be no eGFR calculation.   COVID-19, FLU A/B, RSV RAPID BY PCR - Abnormal; Notable for the following components:    SARS-CoV-2 Detected (*)     All other components within normal limits    Narrative:     Results are for the simultaneous qualitative identification of SARS-CoV-2 (formerly 2019-nCoV), Influenza A, Influenza B, and RSV RNA. These etiologic agents are generally detectable in nasopharyngeal and nasal swabs during the ACUTE PHASE of infection. Hence, this test is intended to be performed on respiratory specimens collected from individuals with signs and symptoms of upper respiratory tract infection who meet Centers for Disease Control and Prevention (CDC) clinical and/or epidemiological criteria for Coronavirus Disease 2019 (COVID-19) testing. CDC COVID-19 criteria for testing on human specimens is available at [facility] webpage information for Healthcare Professionals: Coronavirus Disease 2019 (COVID-19) ([email or web address]).     False-negative results may occur if the virus has genomic mutations, insertions, deletions, or rearrangements or if performed very early in the course of illness. Otherwise, negative results indicate virus specific RNA targets are not detected, however negative results do not preclude SARS-CoV-2 infection/COVID-19, Influenza, or Respiratory syncytial virus infection. Results should not be used as the sole basis for patient management decisions. Negative results must be combined with  clinical observations, patient history, and epidemiological information. If upper respiratory tract infection is still suspected based on exposure history together with other clinical findings, re-testing should be considered.    Disclaimer:   This assay has been authorized by FDA under an Emergency Use Authorization for use in laboratories certified under the Clinical Laboratory Improvement Amendments of 1988 (CLIA), 42 U.S.C. [phone], to perform high complexity tests. The impacts of vaccines, antiviral therapeutics, antibiotics, chemotherapeutic or immunosuppressant drugs have not been evaluated.     Test methodology:   Cepheid Xpert Xpress SARS-CoV-2/Flu/RSV Assay real-time polymerase chain reaction (RT-PCR) test on the GeneXpert Dx and Xpert Xpress  systems.   LACTIC ACID LEVEL W/ REFLEX FOR LEVEL >2.0 - Abnormal; Notable for the following components:    LACTIC ACID 2.4 (*)     All other components within normal limits   CBC WITH DIFF - Abnormal; Notable for the following components:    RBC 4.13 (*)     HGB 12.2 (*)     HCT 36.0 (*)     PLATELETS 139 (*)     NEUTROPHIL % 79 (*)     LYMPHOCYTE % 14 (*)     All other components within normal limits   ADULT ROUTINE BLOOD CULTURE, SET OF 2 BOTTLES (BACTERIA AND YEAST)   ADULT ROUTINE BLOOD CULTURE, SET OF 2 BOTTLES (BACTERIA AND YEAST)   CBC/DIFF    Narrative:     The following orders were created for panel order CBC/DIFF.  Procedure                               Abnormality         Status                     ---------                               -----------         ------                     CBC WITH DIFF[[id number]]                Abnormal            Final result                 Please view results for these tests on the individual orders.   EXTRA TUBES    Narrative:     The following orders were created for panel order EXTRA TUBES.  Procedure                               Abnormality         Status                     ---------                                -----------         ------                     LIGHT GREEN TOP KGUR[[id number]]                             In process                   Please view results for these tests on the individual orders.   LIGHT GREEN TOP TUBE   EXTRA TUBES    Narrative:     The following orders were created for panel order EXTRA TUBES.  Procedure                               Abnormality  Status                     ---------                               -----------         ------                     BLUE TOP CVEL[[id number]]                                    In process                 GOLD TOP CHEN[[id number]]                                    In process                 LAVENDER TOP TIRW[[id number]]                                In process                   Please view results for these tests on the individual orders.   BLUE TOP TUBE   GOLD TOP TUBE   LAVENDER TOP TUBE   LACTIC ACID TIMED       XR CHEST PA AND LATERAL   Final Result by Edi, Radresults In (04/14 1237)   RIGHT LOWER LOBE PNEUMONIA.         Radiologist location ID: [place] Decision Making          MDM                 Medications Administered in the ED   cefTRIAXone (ROCEPHIN) 1 g in NS 50 mL IVPB minibag (1 g Intravenous New Bag/New Syringe 06/29/21 1435)   azithromycin (ZITHROMAX) tablet (500 mg Oral Given 06/29/21 1435)       Discharged             Clinical Impression   COVID-19 (Primary)   Pneumonia of right lower lobe due to infectious organism         Current Discharge Medication List      START taking these medications    Details   azithromycin (ZITHROMAX) 250 mg Oral Tablet Take 500 mg (2 tab) on day 1; take 250 mg (1 tab) on days 2-5.  Qty: 6 Tablet, Refills: 0      cefuroxime (CEFTIN) 500 mg Oral Tablet Take 1 Tablet (500 mg total) by mouth Twice daily for 10 days  Qty: 20 Tablet, Refills: 0      nirmatrelvir-ritonavir (PAXLOVID, EUA,) 300 mg (150 mg x 2)-100 mg Oral Tablets, Dose Pack Take 3 Tablets by mouth Every 12 hours for 5  days  Qty: 30 Tablet, Refills: 0                 _______________________________  Darryll Capers, D.O.  Emergency Medicine  [place]

## 2021-06-29 NOTE — ED APP Handoff Note (Signed)
[facility]  Emergency Department  Provider in Triage Note    Name: Kelsey Preston  Age: 81 y.o.  Gender: female     Subjective:   Kelsey Preston is a 81 y.o. female who presents with complaint of Flu Like Symptoms  .  cough and SOB x 3-4 days. Hx of covid and pneumonia in last few months.       Objective:   There were no vitals filed for this visit.   Focused Physical Exam shows elderly female sitting upright in no apparent distress.  Frequent coughing noted.    Assessment:  A medical screening exam was completed.  This patient is a 81 y.o. female with initial findings showing elderly female with upper respiratory and lower respiratory symptoms.      Plan:  Please see initial orders and work-up below.  This is to be continued with full evaluation in the main Emergency Department.     No current facility-administered medications for this encounter.     No results found for this or any previous visit (from the past 24 hour(s)).     Senaida Lange, PA-C  06/29/2021, 12:07

## 2021-06-29 NOTE — ED Triage Notes (Addendum)
Flu like symptoms, cough, headache, body aches, runny nose. COVID+ 04/2021

## 2021-07-04 LAB — ADULT ROUTINE BLOOD CULTURE, SET OF 2 BOTTLES (BACTERIA AND YEAST)
BLOOD CULTURE, ROUTINE: NO GROWTH
BLOOD CULTURE, ROUTINE: NO GROWTH

## 2021-07-11 ENCOUNTER — Other Ambulatory Visit: Payer: Self-pay

## 2021-07-11 ENCOUNTER — Other Ambulatory Visit (HOSPITAL_COMMUNITY): Payer: Self-pay | Admitting: PHYSICIAN/UNDEFINED PHYSICIAN TYPE

## 2021-07-11 ENCOUNTER — Other Ambulatory Visit: Payer: Medicare Other | Attending: PHYSICIAN/UNDEFINED PHYSICIAN TYPE

## 2021-07-11 ENCOUNTER — Inpatient Hospital Stay (HOSPITAL_BASED_OUTPATIENT_CLINIC_OR_DEPARTMENT_OTHER)
Admission: RE | Admit: 2021-07-11 | Discharge: 2021-07-11 | Disposition: A | Discharge status: Home / Self Care | Payer: Medicare Other | Source: Ambulatory Visit | Attending: PHYSICIAN/UNDEFINED PHYSICIAN TYPE | Admitting: PHYSICIAN/UNDEFINED PHYSICIAN TYPE

## 2021-07-11 DIAGNOSIS — J189 Pneumonia, unspecified organism: Secondary | ICD-10-CM | POA: Insufficient documentation

## 2021-07-11 DIAGNOSIS — E785 Hyperlipidemia, unspecified: Secondary | ICD-10-CM

## 2021-07-11 DIAGNOSIS — U071 COVID-19: Secondary | ICD-10-CM

## 2021-07-11 LAB — LIPID PANEL
CHOL/HDL RATIO: 4.8
CHOLESTEROL: 249 mg/dL — ABNORMAL HIGH (ref <200)
HDL CHOL: 52 mg/dL (ref 23–92)
LDL CALC: 176 mg/dL — ABNORMAL HIGH (ref 0–100)
TRIGLYCERIDES: 105 mg/dL (ref <=150)
VLDL CALC: 21 mg/dL (ref 0–50)

## 2021-07-11 LAB — CBC
HCT: 37.3 % (ref 37.0–47.0)
HGB: 12.3 g/dL — ABNORMAL LOW (ref 12.5–16.0)
MCH: 28.6 pg (ref 27.0–32.0)
MCHC: 33 g/dL (ref 32.0–36.0)
MCV: 86.7 fL (ref 78.0–99.0)
MPV: 10.3 fL (ref 7.4–10.4)
PLATELETS: 160 10*3/uL (ref 140–440)
RBC: 4.3 10*6/uL (ref 4.20–5.40)
RDW: 13.8 % (ref 11.6–14.8)
WBC: 4.4 10*3/uL (ref 4.0–10.5)
WBCS UNCORRECTED: 4.4 10*3/uL

## 2021-09-10 IMAGING — MR MRA NECK WITH AND WITHOUT CONTRAST
4 of 5 series · 19 of 48 positions shown · IV contrast (gadavist)
Comparison: No prior evaluation of the carotid and vertebral arteries of the neck is available.

﻿EXAM:  06811   MRA NECK WITH AND WITHOUT CONTRAST
INDICATION: History of basilar artery stenosis.  Prior history of lung cancer.  Hypertension.  Smoking history not available.
TECHNIQUE: 2D time-of-flight MR angiogram of the vertebral and carotid arteries was performed after saturating the venous flow.  Postcontrast coronal images were obtained after injection of 10 mL Gadavist IV.

[Series 7: TOF · axial · 2.0mm · 0.39mm/px · z∈[-92,+79]mm · 8 of 115 slices shown (1 of 2)]
[im 1/115]
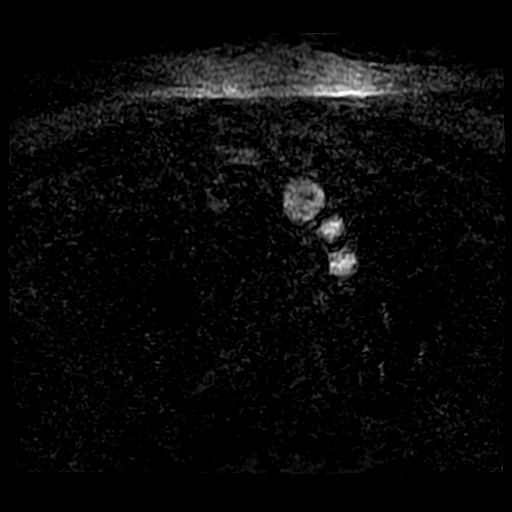
[im 17/115]
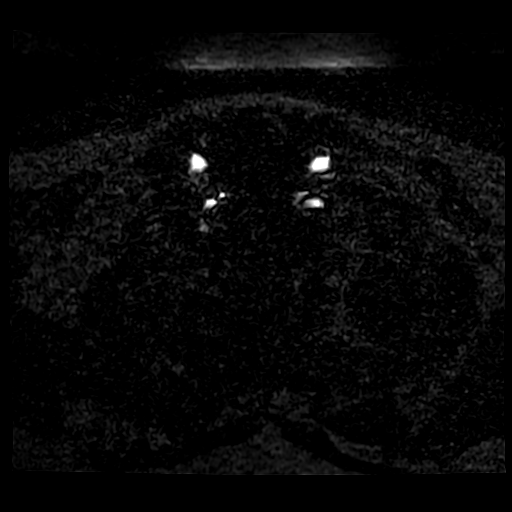
[im 33/115]
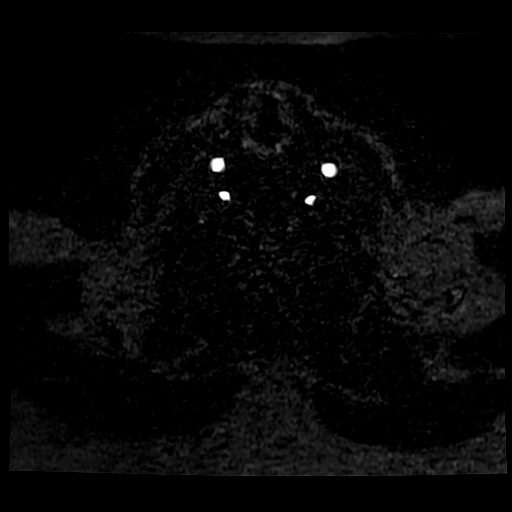
[im 49/115]
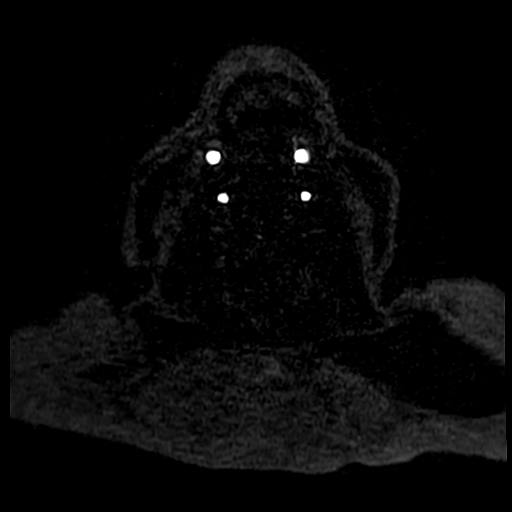
[im 66/115]
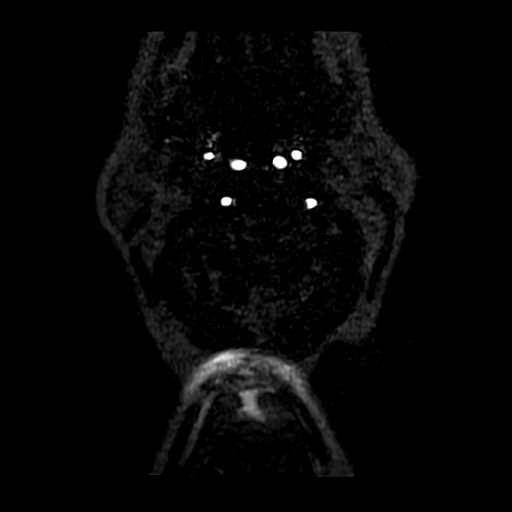
[im 82/115]
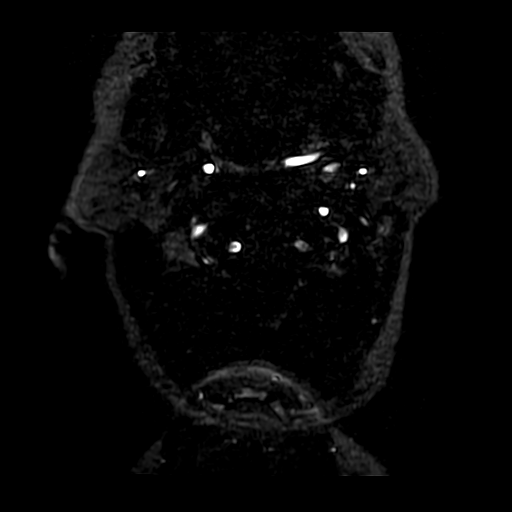
[im 98/115]
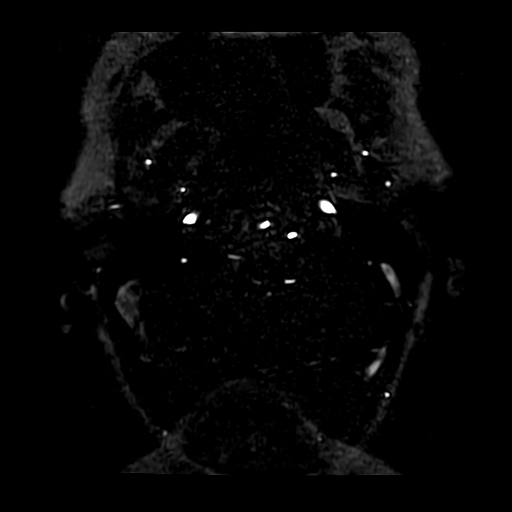
[im 115/115]
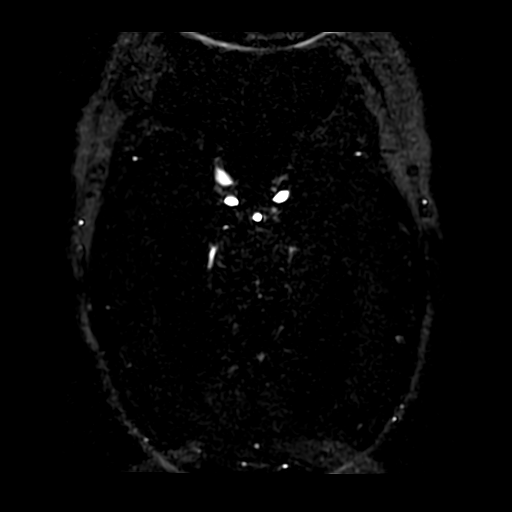

[Series 9: pre/post flute carotids · coronal · non-contrast · 1.5mm · 0.68mm/px · 5 of 200 slices shown (1 of 2)]
[im 1/200]
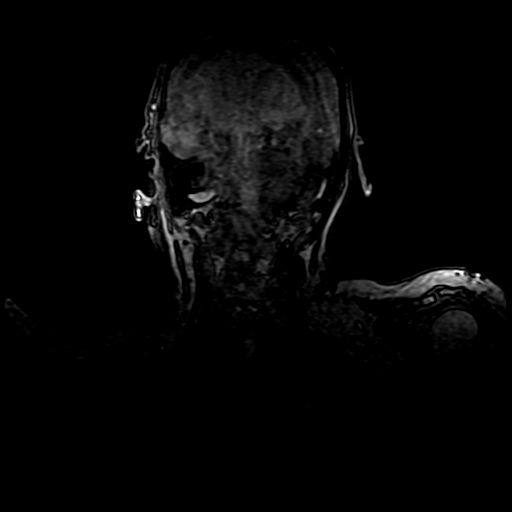
[im 29/200]
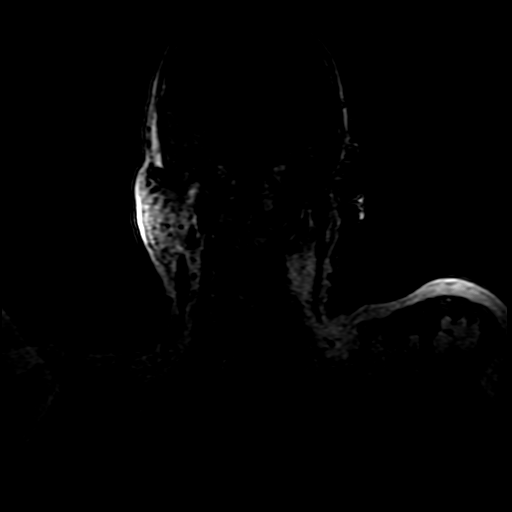
[im 57/200]
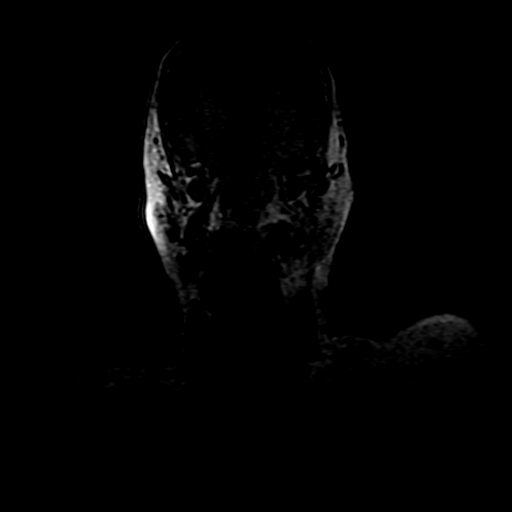
[im 100/200]
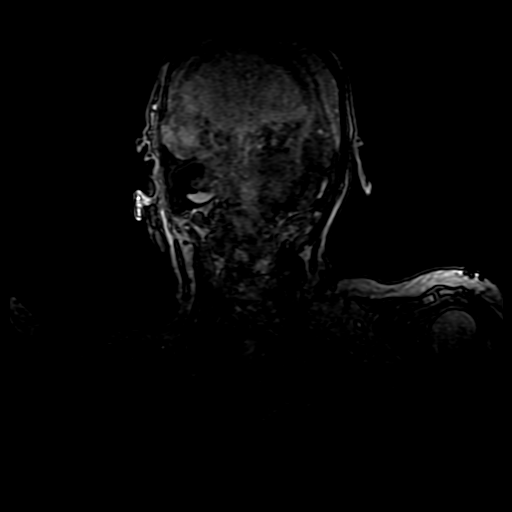
[im 171/200]
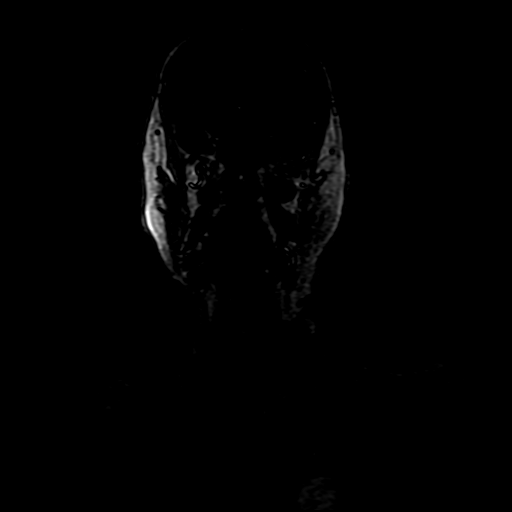

[Series 11: pre/post flute carotids · coronal · non-contrast · 1.5mm · 0.68mm/px · 3 of 200 slices shown (2 of 2)]
[im 29/200]
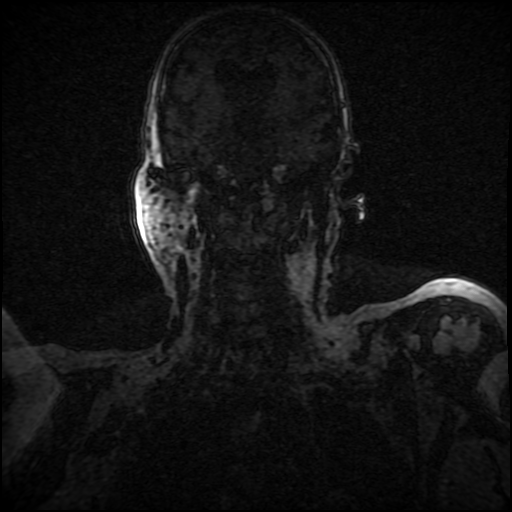
[im 100/200]
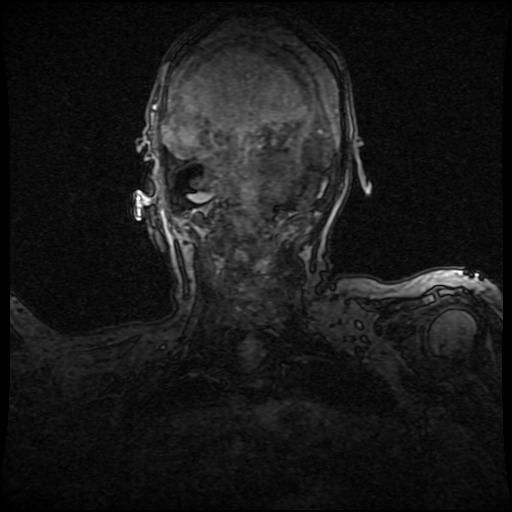
[im 171/200]
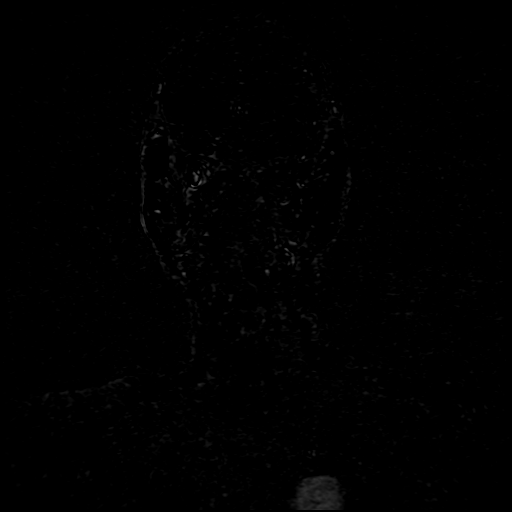

[Series 15: TOF · axial · 2.0mm · 0.39mm/px · z∈[-71,+56]mm · 3 of 115 slices shown (2 of 2)]
[im 15/115]
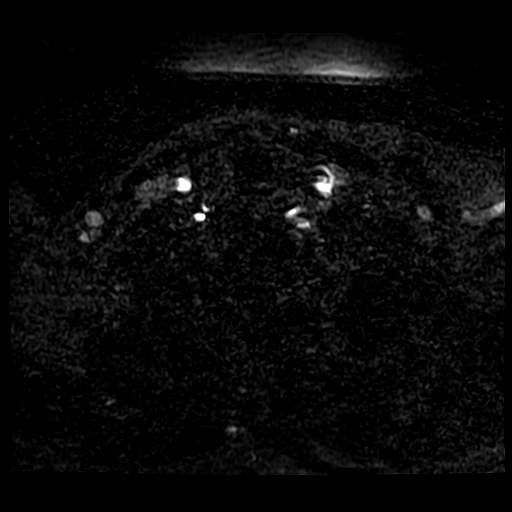
[im 58/115]
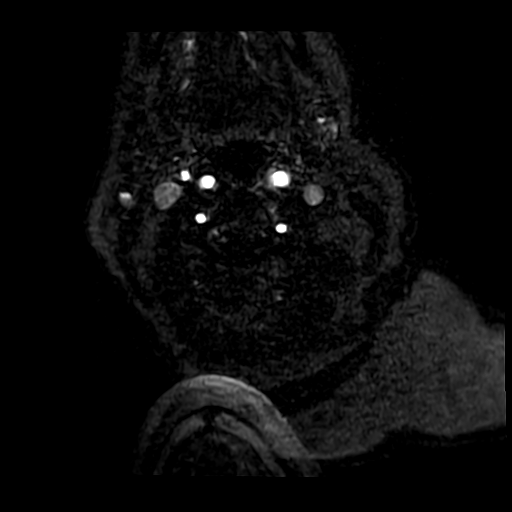
[im 100/115]
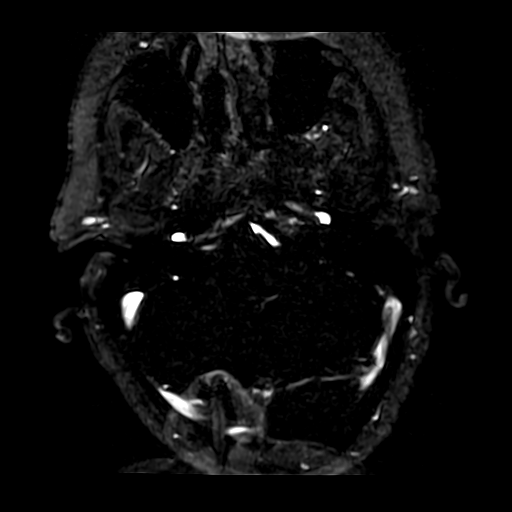

[19 of 48 positions shown; findings below may reference images not displayed]

FINDINGS: The quality of the postcontrast study is poor due to motion artifacts and mistiming of contrast bolus.  However, a noncontrast series is quite adequate.  

The vertebral arteries are patent on both sides.  

No hemodynamically significant obstruction of common and internal carotid arteries is seen in the neck according to NASCET criteria.
IMPRESSION: There is no evidence of hemodynamically significant obstruction of carotid arteries in the neck.  Vertebral arteries are patent in the neck on both sides.

## 2021-09-17 IMAGING — MR MRI BRAIN W/O CONTRAST
9 of 11 series · 34 of 48 positions shown · non-contrast
Comparison: None.

﻿EXAM:  MRI BRAIN W/O CONTRAST
INDICATION: Dizziness.  History of lung cancer.
TECHNIQUE: Noncontrast multiplanar, multisequence MRI was performed.

[Series 5: DWI · axial · 5.0mm · 1.35mm/px · z∈[-32,+94]mm · 9 of 88 slices shown (1 of 3)]
[im 1/88]
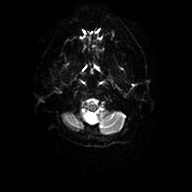
[im 16/88]
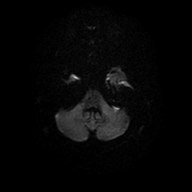
[im 24/88]
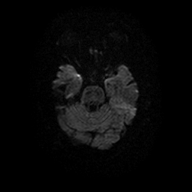
[im 40/88]
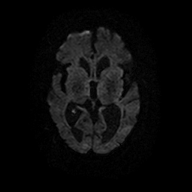
[im 48/88]
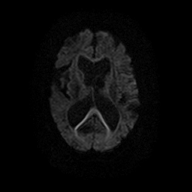
[im 64/88]
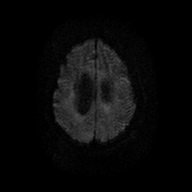
[im 72/88]
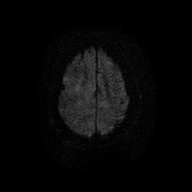
[im 80/88]
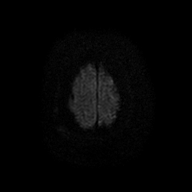
[im 88/88]
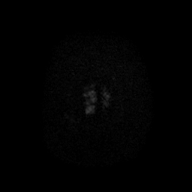

[Series 6: DWI · axial · 5.0mm · 1.35mm/px · z∈[-32,+94]mm · 3 of 22 slices shown (2 of 3)]
[im 1/22]
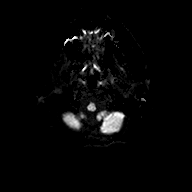
[im 11/22]
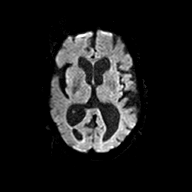
[im 22/22]
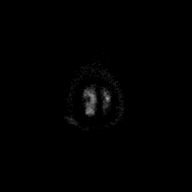

[Series 7: DWI · axial · 5.0mm · 1.35mm/px · z∈[-32,+94]mm · 3 of 21 slices shown (3 of 3)]
[im 1/21]
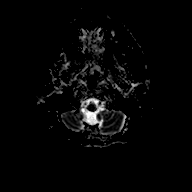
[im 11/21]
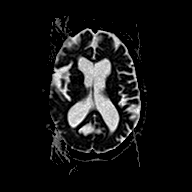
[im 21/21]
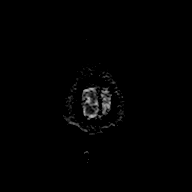

[Series 8: FLAIR · sagittal · 4.0mm · 0.75mm/px · 4 of 26 slices shown (1 of 2)]
[im 1/26]
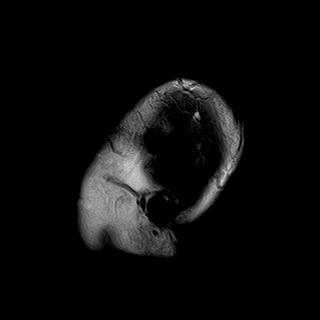
[im 9/26]
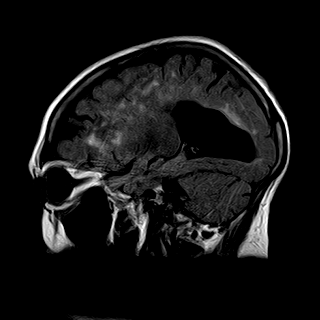
[im 17/26]
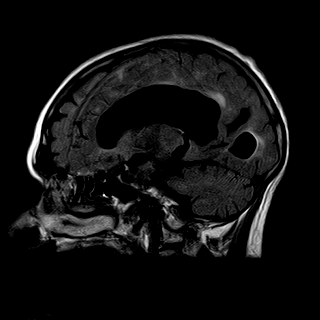
[im 26/26]
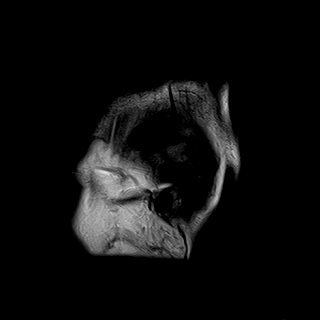

[Series 9: T2 · axial · 5.0mm · 0.43mm/px · z∈[-38,+105]mm · 4 of 25 slices shown (1 of 3)]
[im 1/25]
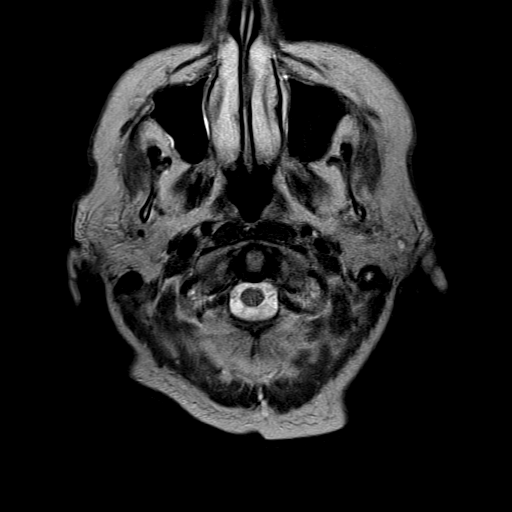
[im 9/25]
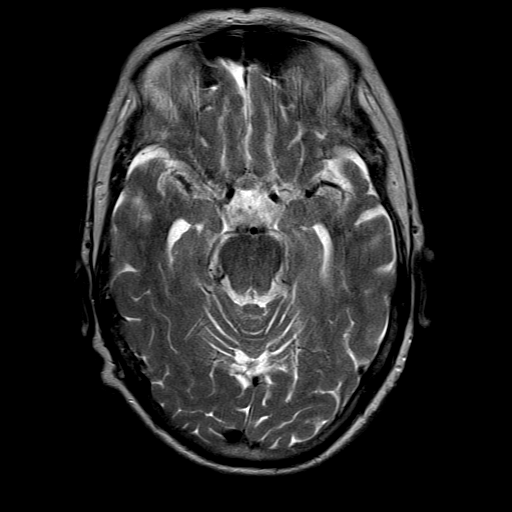
[im 17/25]
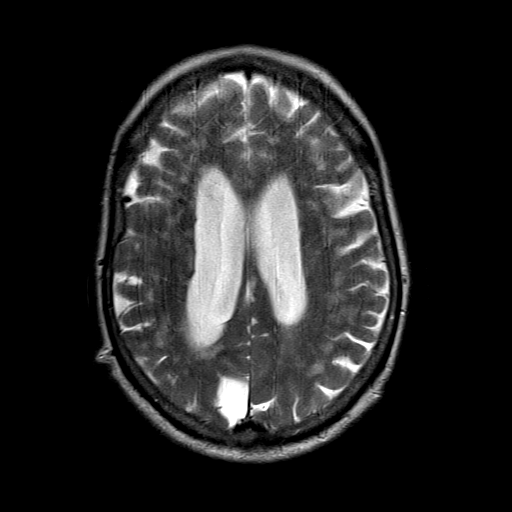
[im 25/25]
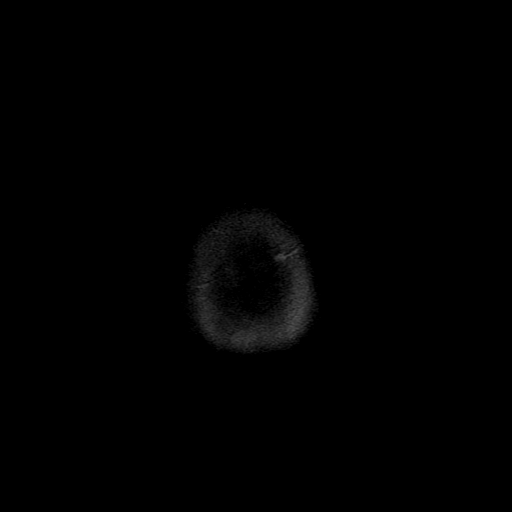

[Series 10: FLAIR · axial · 5.0mm · 0.43mm/px · z∈[-38,+105]mm · 4 of 25 slices shown (2 of 2)]
[im 1/25]
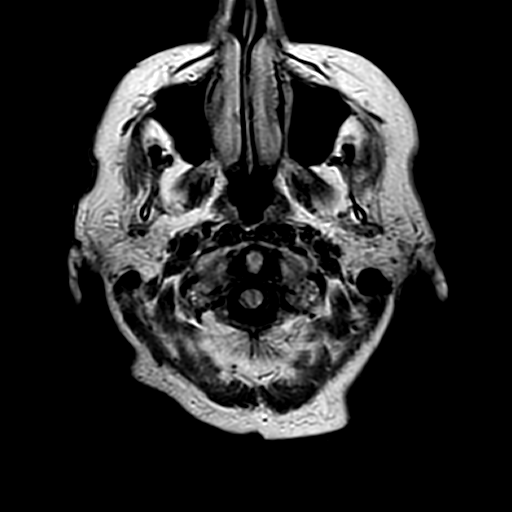
[im 9/25]
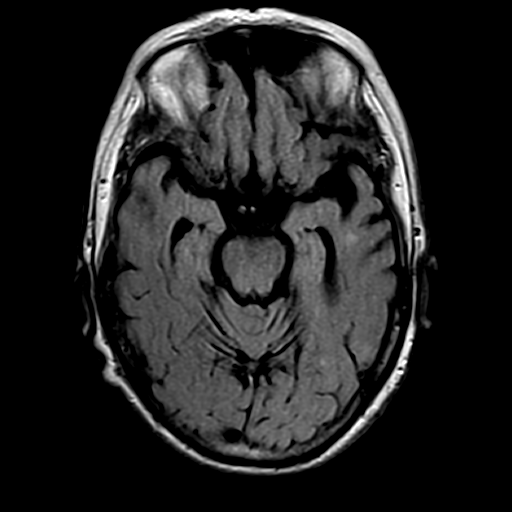
[im 17/25]
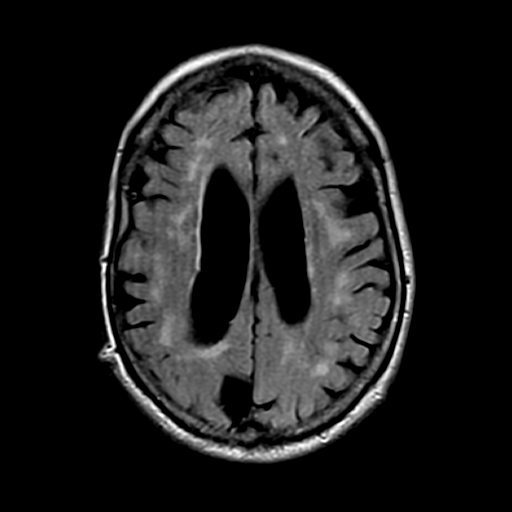
[im 25/25]
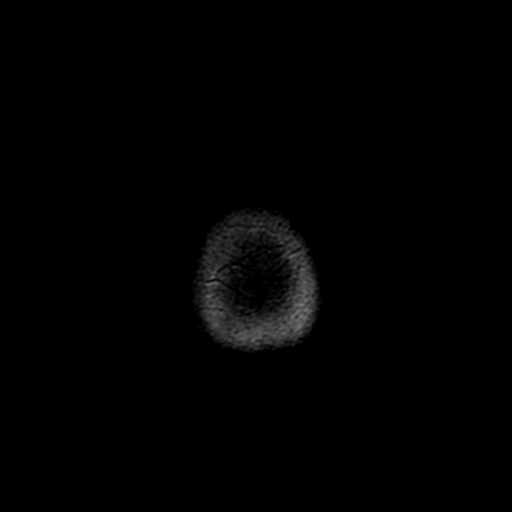

[Series 11: T1 · axial · 5.0mm · 0.43mm/px · 1 of 25 slices shown]
[im 1/25]
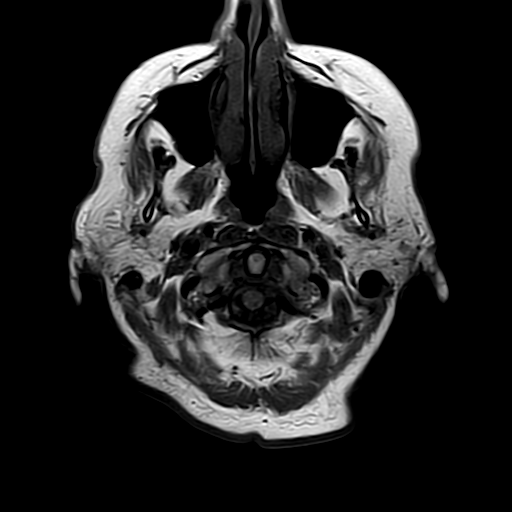

[Series 13: T2 · coronal · 6.0mm · 0.43mm/px · 3 of 24 slices shown (2 of 3)]
[im 1/24]
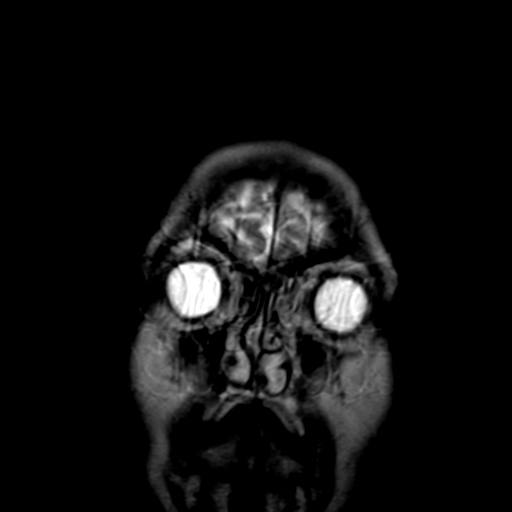
[im 12/24]
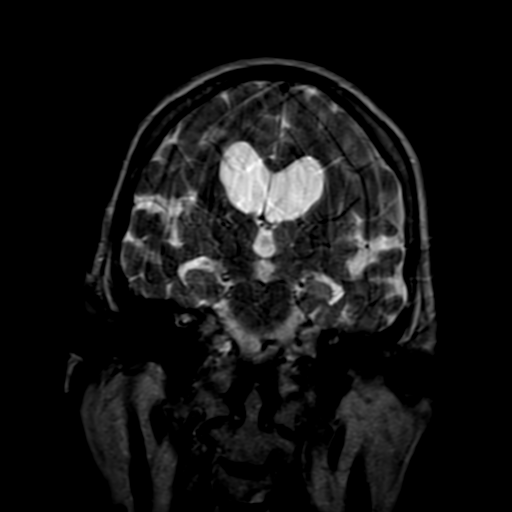
[im 24/24]
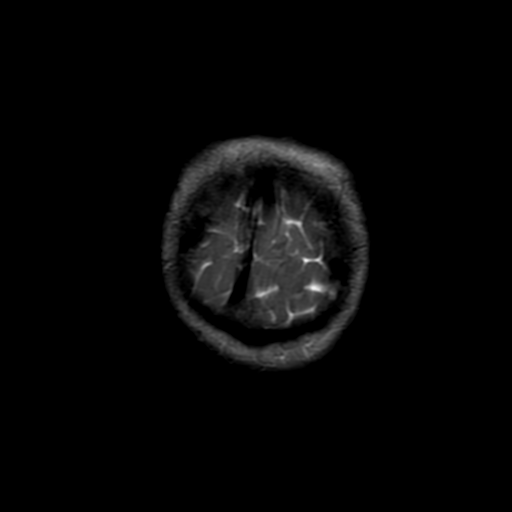

[Series 15: T2 · coronal · 6.0mm · 0.43mm/px · 3 of 24 slices shown (3 of 3)]
[im 1/24]
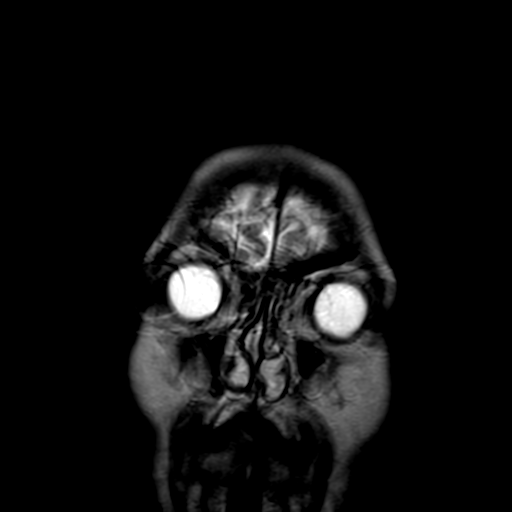
[im 12/24]
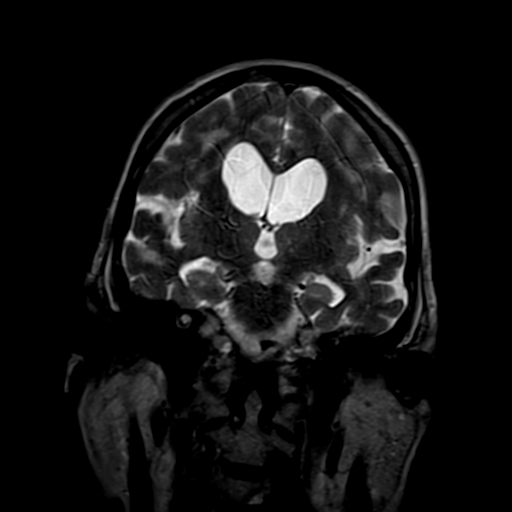
[im 24/24]
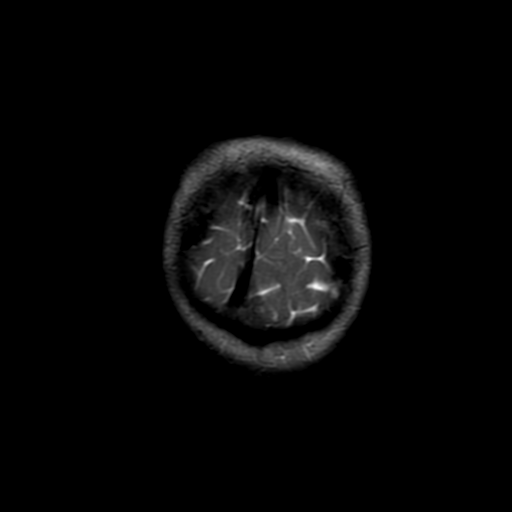

[34 of 48 positions shown; findings below may reference images not displayed]

FINDINGS: There is moderately severe atrophy.  The degree of ventricular dilatation is proportionately greater than the degree of cortical atrophy raising the possibility of normal pressure hydrocephalus.  

There is no mass, hemorrhage, shift of the midline structures, or extra-axial collection. 

There is extensive, bilateral, multifocal white matter disease compatible with moderately severe chronic small vessel ischemic change.  

The visualized paranasal sinuses and mastoid air cells appear clear.
IMPRESSION: 1. Moderately severe atrophy.

2. Possible normal pressure hydrocephalus.

3. Moderately severe chronic small vessel ischemic change.

## 2021-09-17 IMAGING — MR MRA HEAD WITHOUT CONTRAST
3 of 4 series · 20 of 48 positions shown · non-contrast
Comparison: None.

﻿EXAM:  99911   MRA HEAD WITHOUT CONTRAST
INDICATION: Dizziness and giddiness.  Occlusion or stenosis of the basilar artery.
TECHNIQUE: 3D time-of-flight imaging was performed.

[Series 5: DWI · axial · 5.0mm · 1.35mm/px · z∈[-41,+85]mm · 14 of 88 slices shown (1 of 3)]
[im 1/88]
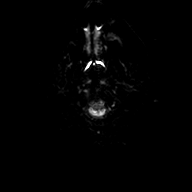
[im 7/88]
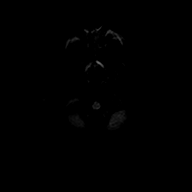
[im 14/88]
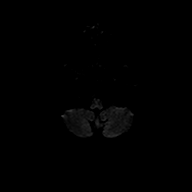
[im 21/88]
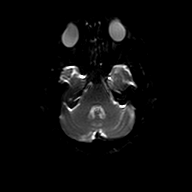
[im 27/88]
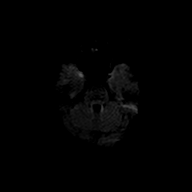
[im 34/88]
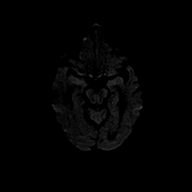
[im 41/88]
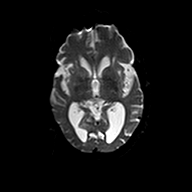
[im 47/88]
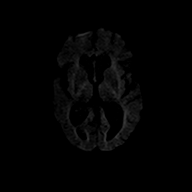
[im 54/88]
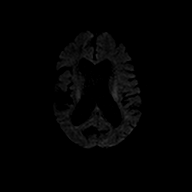
[im 61/88]
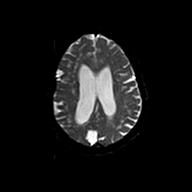
[im 67/88]
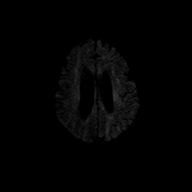
[im 74/88]
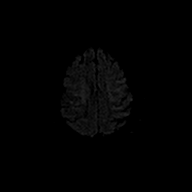
[im 81/88]
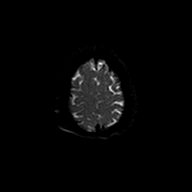
[im 88/88]
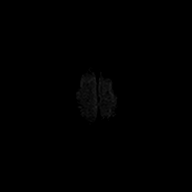

[Series 6: DWI · axial · 5.0mm · 1.35mm/px · z∈[-41,+85]mm · 3 of 22 slices shown (2 of 3)]
[im 1/22]
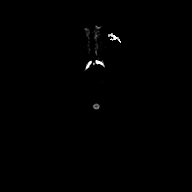
[im 11/22]
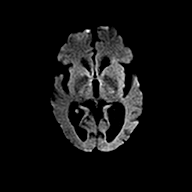
[im 22/22]
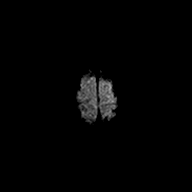

[Series 7: DWI · axial · 5.0mm · 1.35mm/px · z∈[-41,+85]mm · 3 of 22 slices shown (3 of 3)]
[im 1/22]
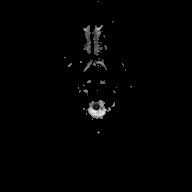
[im 11/22]
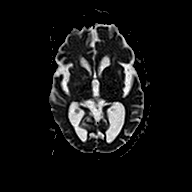
[im 22/22]
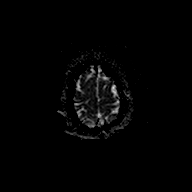

[20 of 48 positions shown; findings below may reference images not displayed]

FINDINGS: MR angiography demonstrates no evidence of aneurysm, vascular malformation, or major vascular occlusion.  This examination is somewhat limited by artifact.
IMPRESSION: Essentially unremarkable cerebral angiography.

## 2022-05-10 ENCOUNTER — Ambulatory Visit (HOSPITAL_COMMUNITY)
Admission: RE | Admit: 2022-05-10 | Discharge: 2022-05-10 | Disposition: A | Discharge status: Home / Self Care | Payer: Self-pay | Source: Ambulatory Visit

## 2022-05-10 IMAGING — MG 3D SCREENING MAMMO BIL AND TOMO
3 series · 7 of 24 positions shown · non-contrast
Comparison: Exams dated 09/27/2022, 09/26/2021.

------------- REPORT [id number] -------------
﻿

EXAM:  3D SCREENING MAMMO BIL AND TOMO
INDICATION: Screening mammogram.  Asymptomatic 81-year-old. Lifetime risk cancer risk 3.3%.

[R CC tomo · right · 0.10mm/px · 4 of 4 slices shown]
[im 1/4]
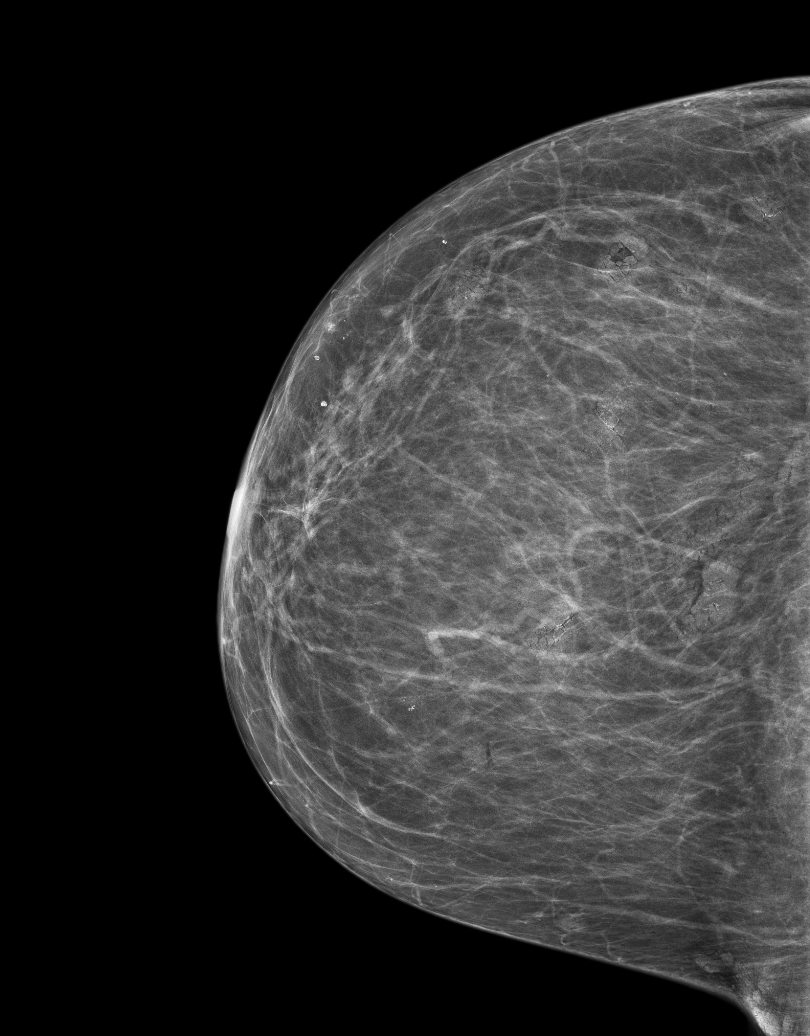
[im 2/4]
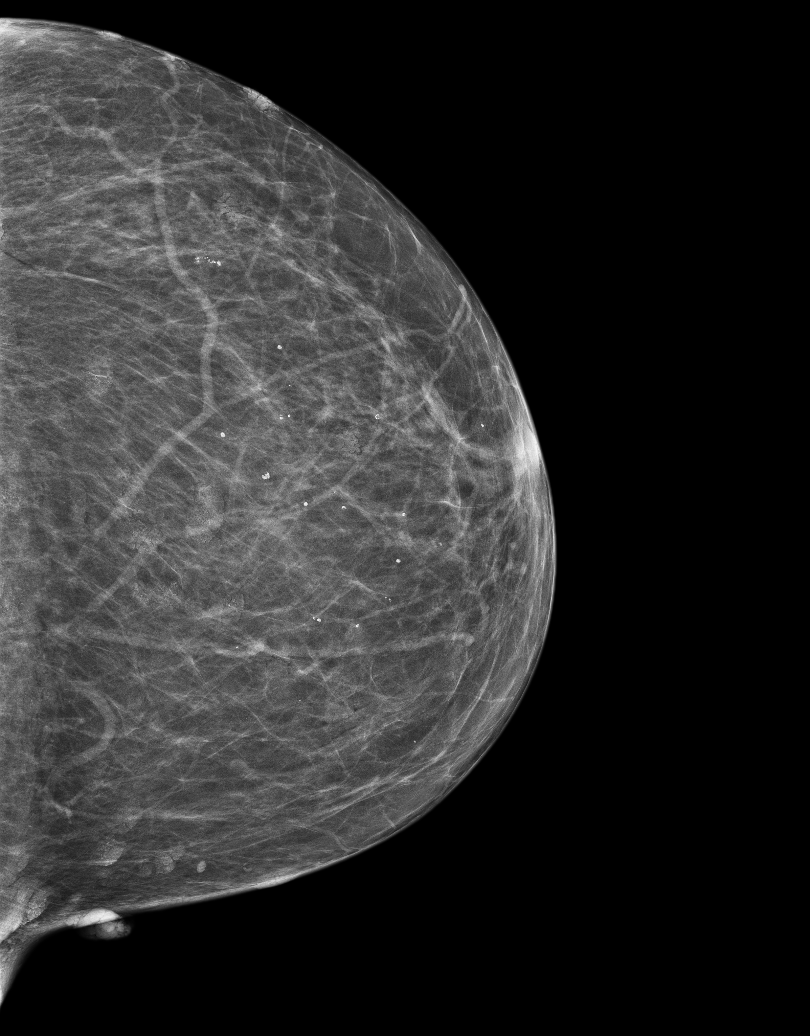
[im 3/4]
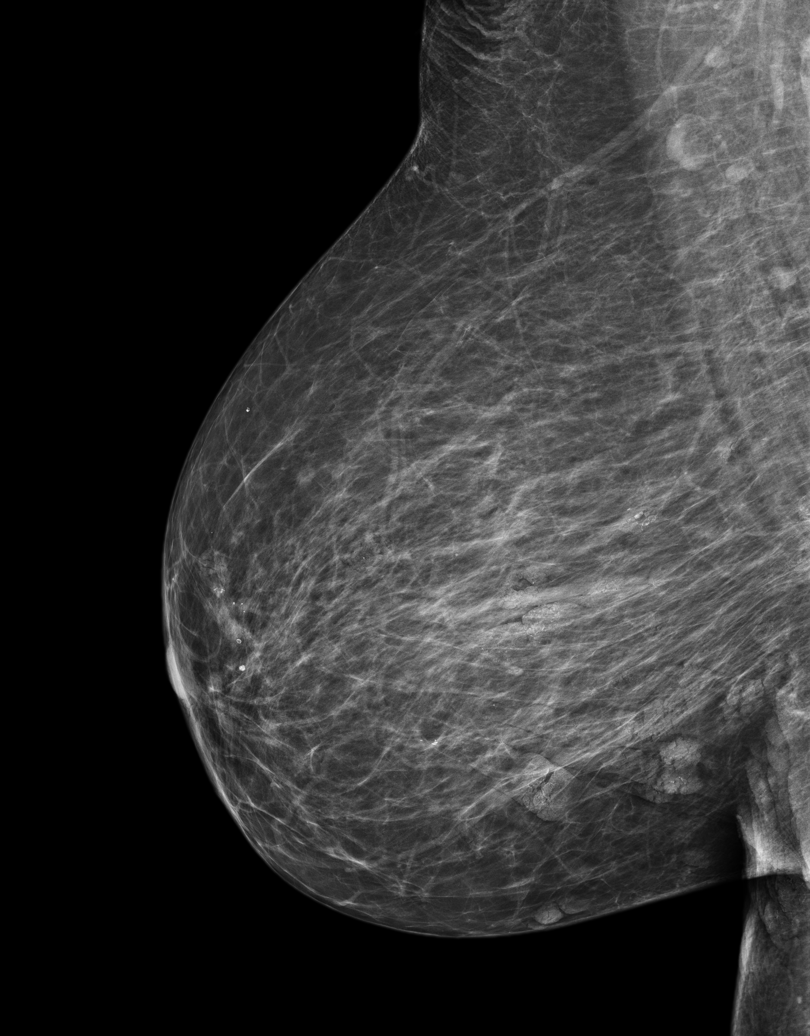
[im 4/4]
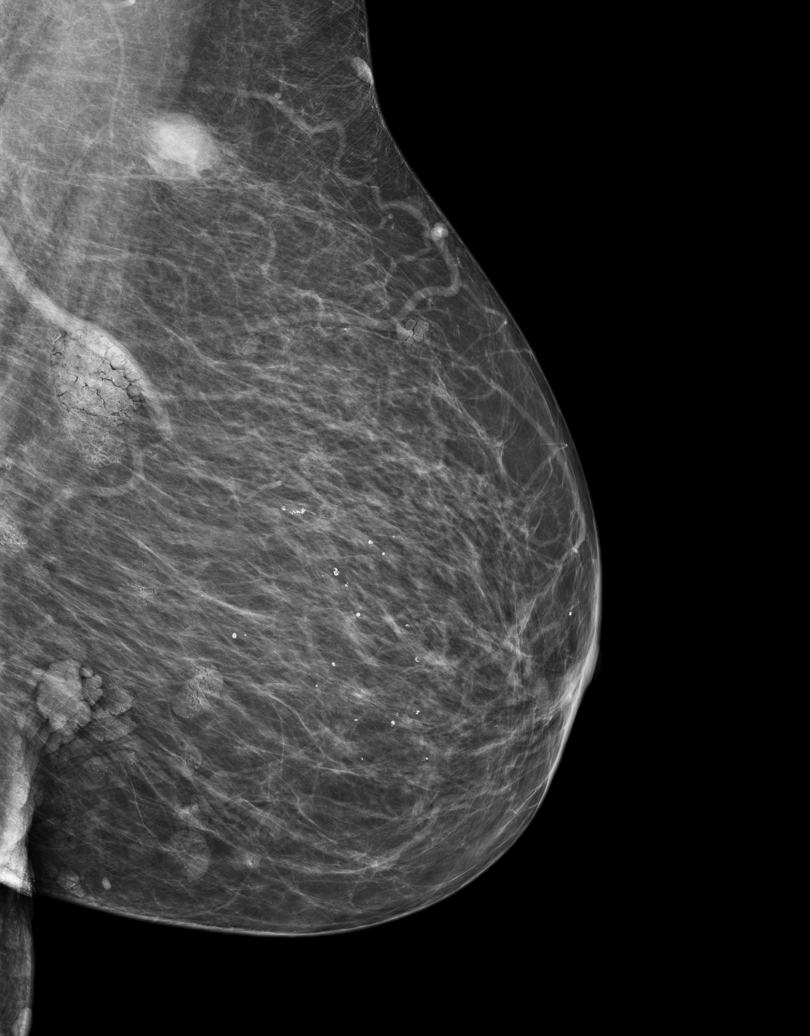

[3D SCREENING MAMMO BIL AND TOMO tomo · 2 acquisitions, 2 frames shown (1 of 2)]
[im 1/2]
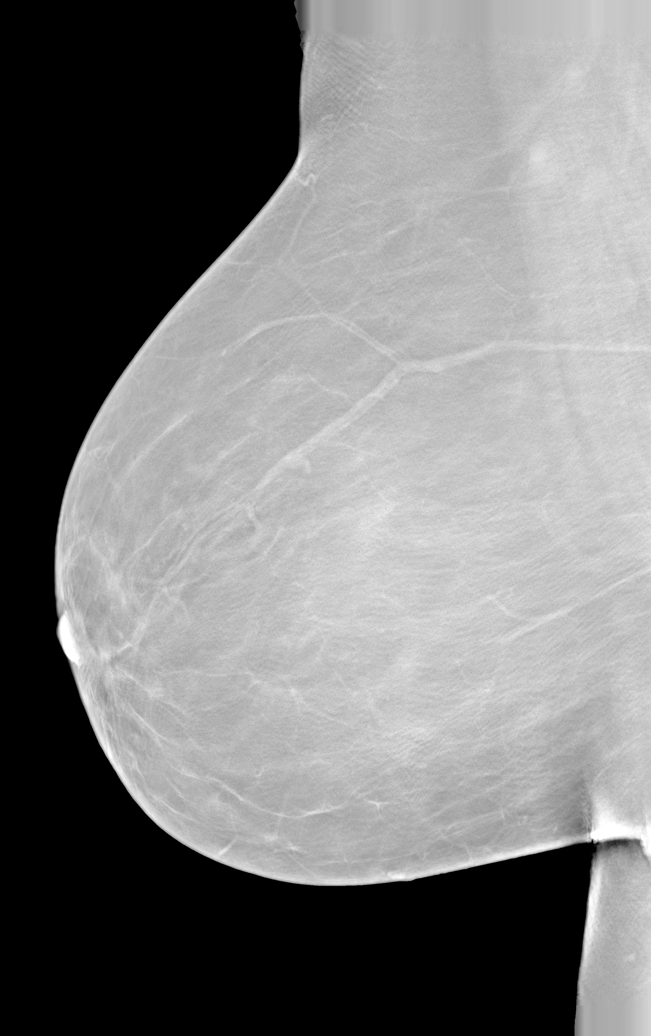
[im 2/2]
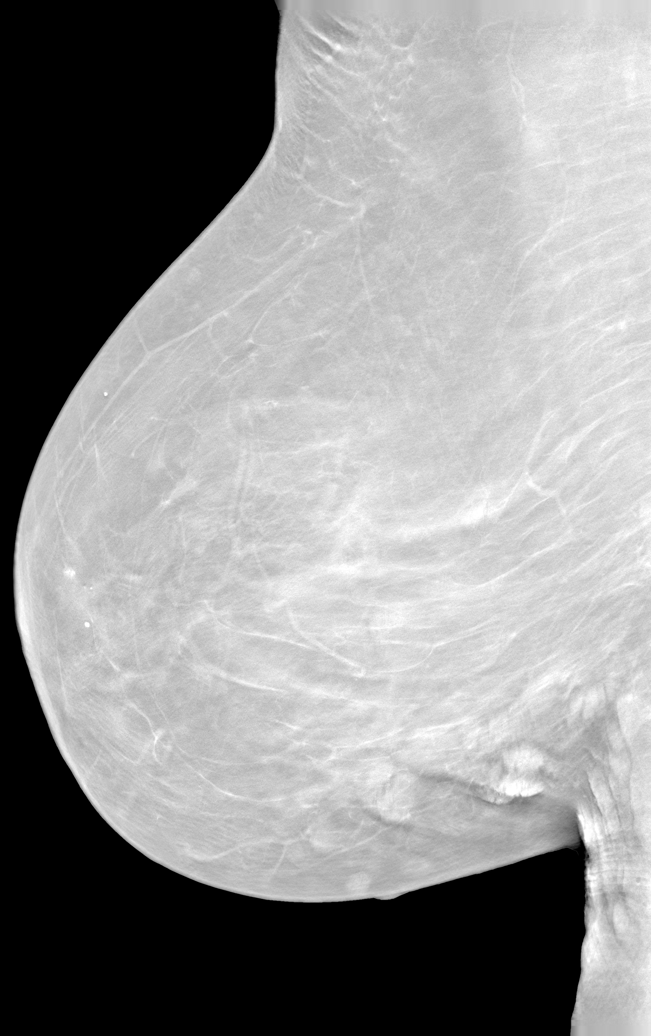

[3D SCREENING MAMMO BIL AND TOMO tomo (2 of 2) · tomo slice 10/61.0]
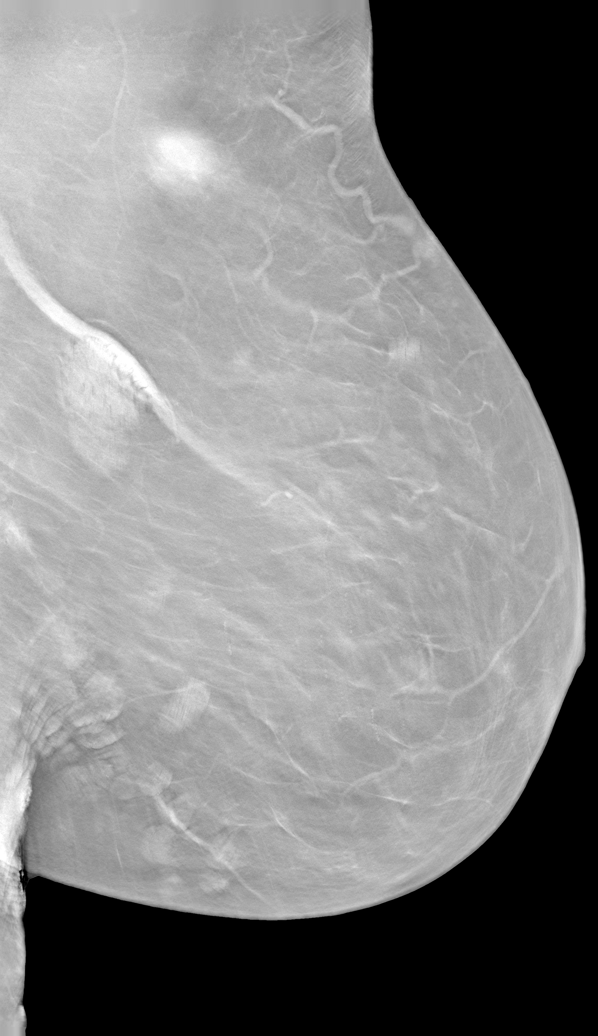

[7 of 24 positions shown; findings below may reference images not displayed]

FINDINGS: No new mass or architectural change are noted.  Benign calcific foci and multiple skin tags are stable.

Progressively more prominent size of left low axillary lymph node 1.9 cm in size compared with previous studies. Margins of this node and density of this node have increased from prior studies.  Additional evaluation of this finding in the left breast with coned views of the left breast and axilla and ultrasound examination of left breast and axilla are recommended.
IMPRESSION: 1.  BIRADS 0, additional evaluation of left breast as mentioned above.

2.  DENSITY CODE –B (Scattered areas of fibroglandular density)   

Final Assessment Code:

BI-RADS 0
 Need additional imaging evaluation.

BI-RADS 1
 Negative mammogram.

BI-RADS 2
 Benign finding.

BI-RADS 3
 Probably benign finding; short-interval follow-up suggested.

BI-RADS 4
 Suspicious abnormality; biopsy should be considered.

BI-RADS 5
 Highly suggestive of malignancy; appropriate action should be taken.

BI-RADS 6
 Known biopsy-proven malignancy; appropriate action should be taken.

NOTE:
In compliance with Federal regulations, the results of this mammogram are being sent to the patient.

Electronically Signed by HAU, SNEKY at [phone] [DATE]

------------- REPORT [id number] -------------
[facility]
[address]
We wish to report the following on your recent mammography examination. We are sending a report to your referring physician or other health care provider.
FINDING: Abnormal

There is a finding on your mammogram that requires further tests for a more thorough evaluation. You should contact your referring physician as soon as possible (if you have not already done so).

This statement is mandated by the [place], Department of Health.
Your examination was performed by one of our technologists, who are registered radiological technologists and also specially certified in mammography:
___
Tack, Jezdec (M)

Your mammogram was interpreted by our radiologist.

( 
Chonweng Frois, M.D.

(Annual Breast Examination by a physician or other health care provider
(Annual Mammography Screening beginning at age 40
(Monthly Breast Self Examination

## 2022-05-23 ENCOUNTER — Ambulatory Visit (HOSPITAL_COMMUNITY)
Admission: RE | Admit: 2022-05-23 | Discharge: 2022-05-23 | Disposition: A | Discharge status: Home / Self Care | Payer: Self-pay | Source: Ambulatory Visit

## 2022-05-23 IMAGING — US US LT BREAST COMPLETE- 4 QUADRANTS PLUS AXILLA
1 series · 12 of 25 positions shown · non-contrast
Comparison: Bilateral screening mammograms dated 06/02/2024, 06/02/2023.

------------- REPORT [id number] -------------
﻿

EXAM:  US LT BREAST COMPLETE- 4 QUADRANTS PLUS AXILLA,2D DX MAMMO UNI
INDICATION: 81-year-old female underwent screening mammogram which showed a new, 2 cm size density near the left axilla for which she was called back.  No family history of breast cancer.

[Series 1: us left breast complete- 4 quadrants plus axilla · 12 of 45 slices shown]
[im 2/45]
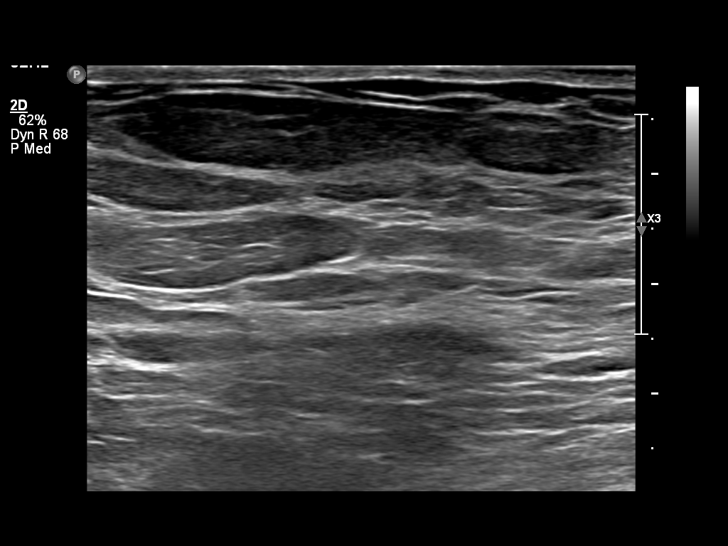
[im 6/45]
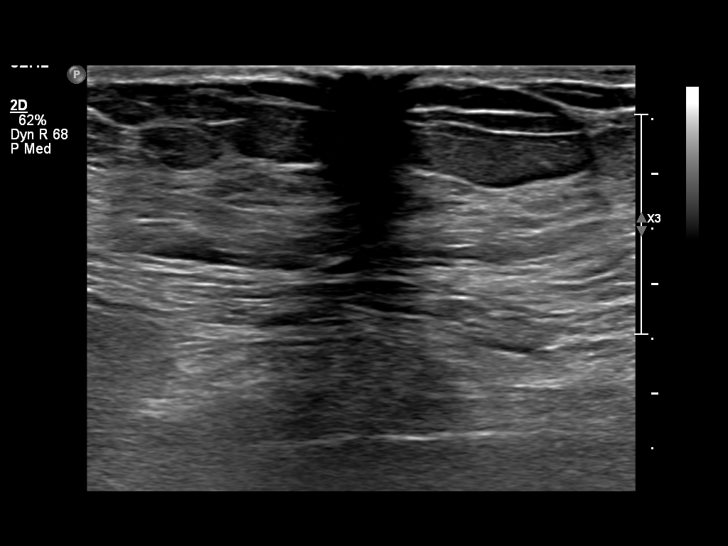
[im 10/45]
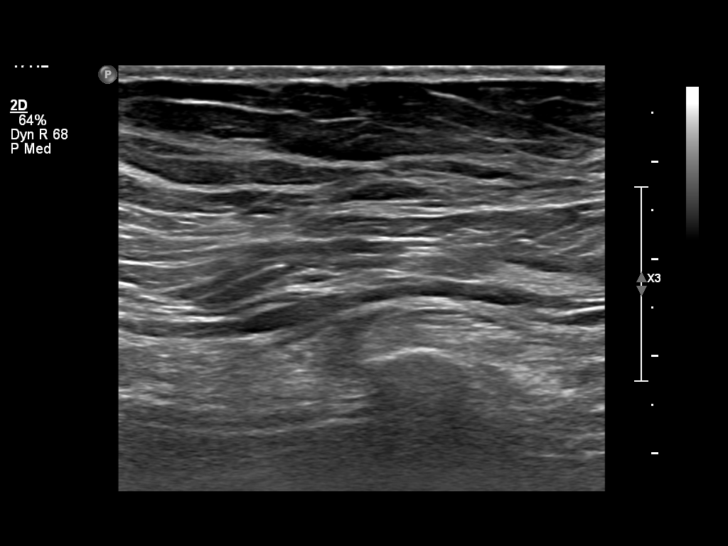
[im 13/45]
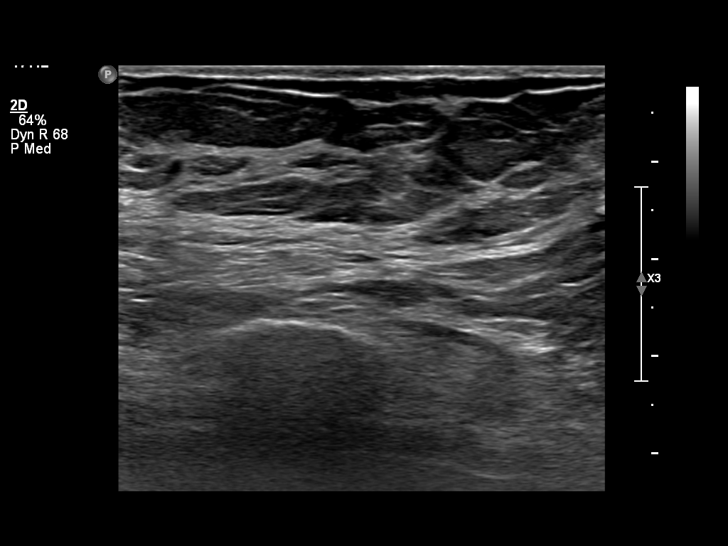
[im 17/45]
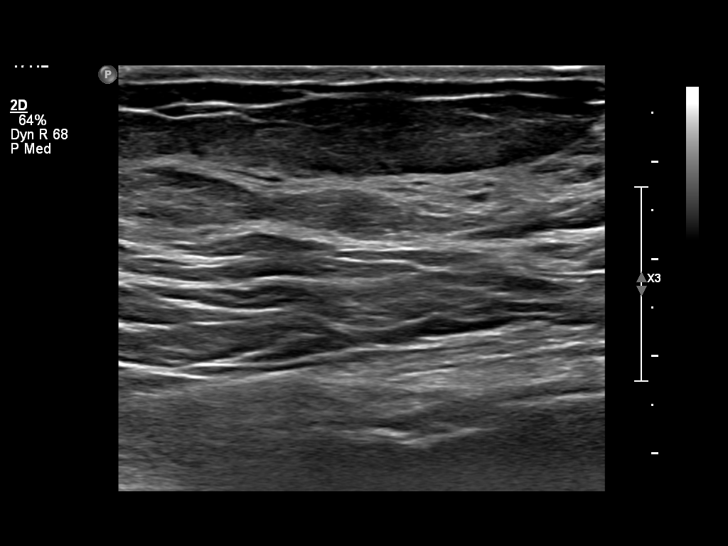
[im 21/45]
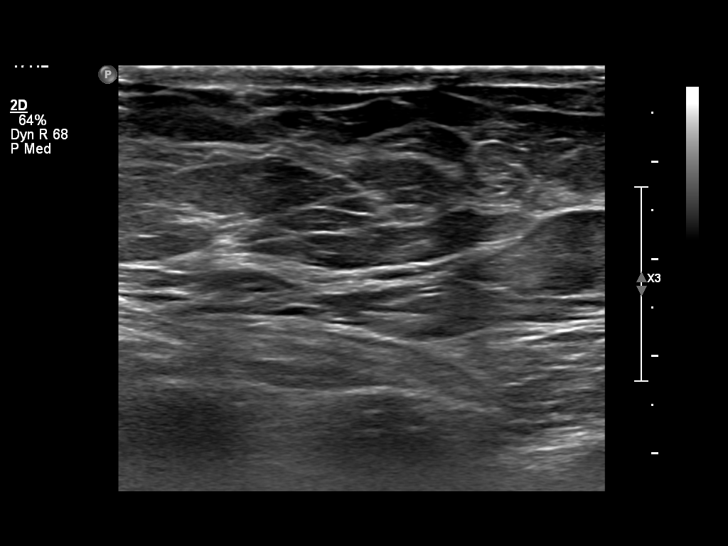
[im 24/45]
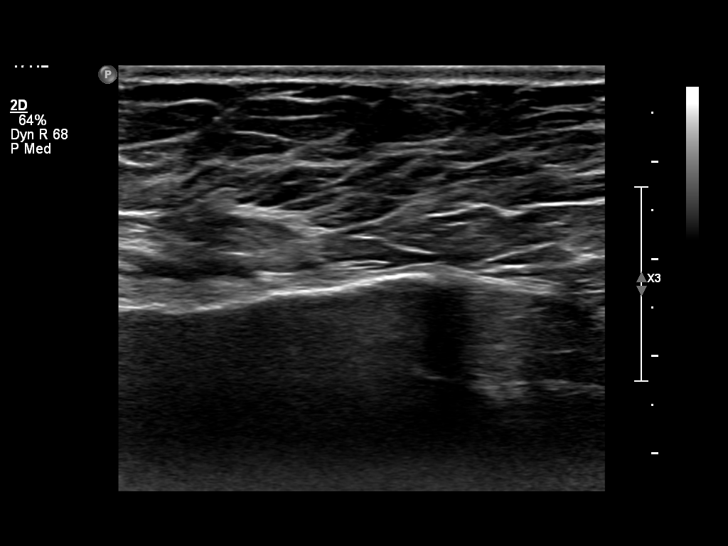
[im 28/45]
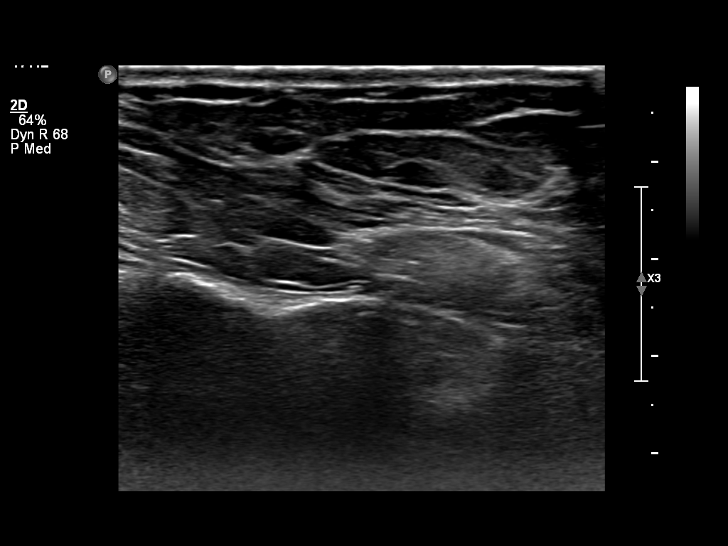
[im 32/45]
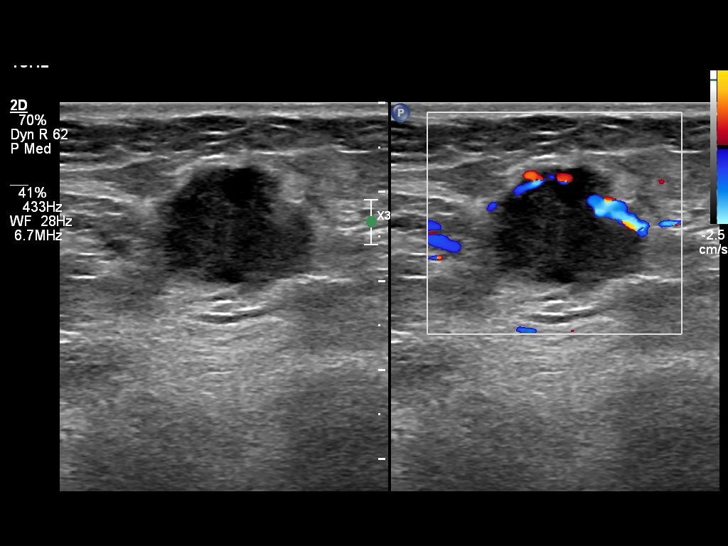
[im 35/45]
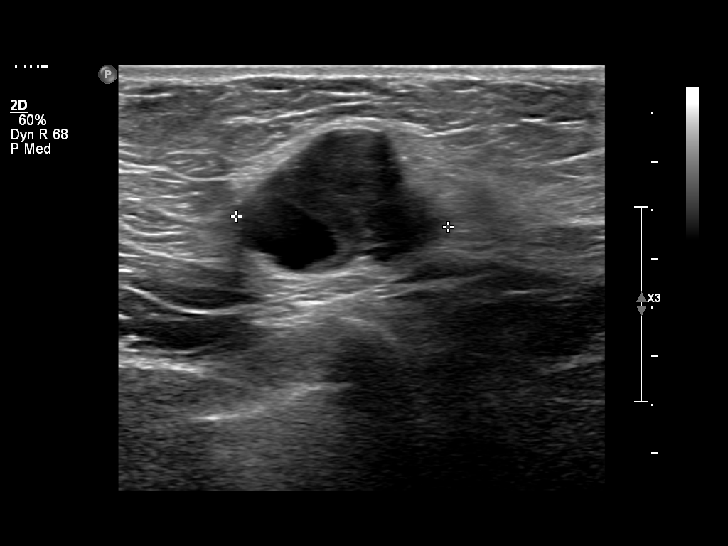
[im 39/45]
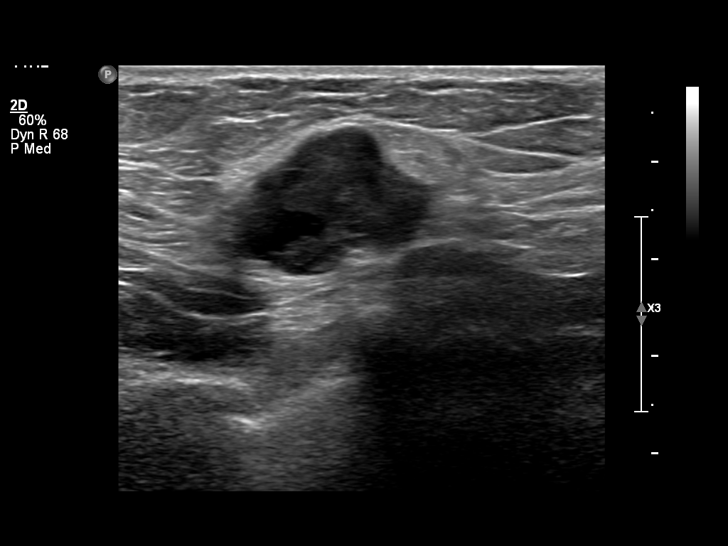
[im 43/45]
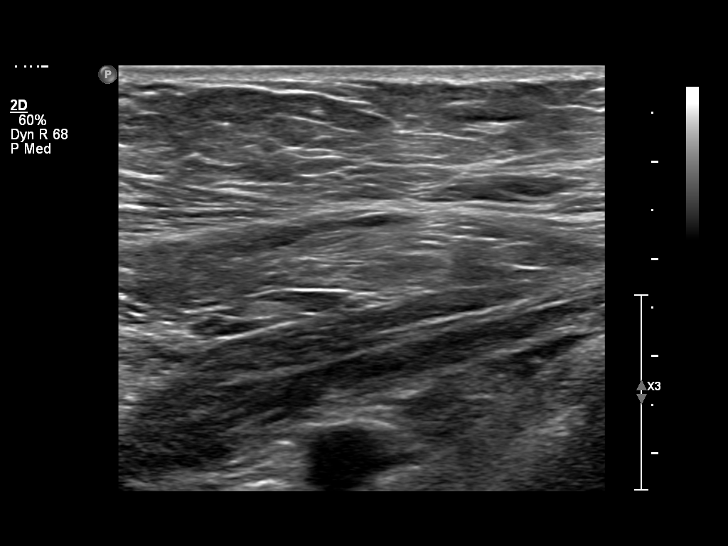

[12 of 25 positions shown; findings below may reference images not displayed]

FINDINGS: Additional spot views of the axillary region of left breast confirm the presence of a 2 cm size partly poorly defined, high-density nodule in the lower left axillary position.  This is confirmed compatible with the mammographic study of 06/02/2024 and not present on earlier studies.

High-resolution ultrasound of the left breast confirms the presence of lobulated, solid nodule with well-circumscribed margins in the lower left axillary position 2 cm in diameter with increased color flow on color Doppler. Small area of cystic necrosis is noted within this lesion.
IMPRESSION: 1. Further evaluation of this lesion with ultrasound guided core needle biopsy is recommended.

2. BIRADS 4, suspicious abnormality in the upper outer quadrant of the left breast in lower left axillary position requiring biopsy.

Final Assessment Code:

BI-RADS 0
 Need additional imaging evaluation.

BI-RADS 1
 Negative mammogram.

BI-RADS 2
 Benign finding.

BI-RADS 3
 Probably benign finding; short-interval follow-up suggested.

BI-RADS 4
 Suspicious abnormality; biopsy should be considered.

BI-RADS 5
 Highly suggestive of malignancy; appropriate action should be taken.

BI-RADS 6
 Known biopsy-proven malignancy; appropriate action should be taken.

NOTE:
In compliance with Federal regulations, the results of this mammogram are being sent to the patient.

------------- REPORT [id number] -------------
[facility]
[address]
We wish to report the following on your recent mammography examination. We are sending a report to your referring physician or other health care provider.
FINDING: Abnormal

There is a finding on your mammogram that requires further tests for a more thorough evaluation. You should contact your referring physician as soon as possible (if you have not already done so).

This statement is mandated by the [place], Department of Health.
Your examination was performed by one of our technologists, who are registered radiological technologists and also specially certified in mammography:
___
Tack, Jezdec (M)

Your mammogram was interpreted by our radiologist.

( 
Chonweng Frois, M.D.

(Annual Breast Examination by a physician or other health care provider
(Annual Mammography Screening beginning at age 40
(Monthly Breast Self Examination

------------- REPORT [id number] -------------
Dear Colleague,

These can be faxed to [phone] or emailed to [EMAIL] .

Under the Federal Government Mammography Quality Standards Act, we are required to follow all abnormal mammograms to conclusion.  Thank you very much for your help.

Sincerely,

___________________
[facility]

## 2022-05-23 IMAGING — MG 2D DX MAMMO UNI
1 series · 3 of 3 positions shown · non-contrast
Comparison: Bilateral screening mammograms dated 05/10/2022, 05/09/2021.

﻿

EXAM:  US LT BREAST COMPLETE- 4 QUADRANTS PLUS AXILLA,2D DX MAMMO UNI
INDICATION: 81-year-old female underwent screening mammogram which showed a new, 2 cm size density near the left axilla for which she was called back.  No family history of breast cancer.

[L XCCL · left · 0.10mm/px · 3 of 3 slices shown]
[im 1/3]
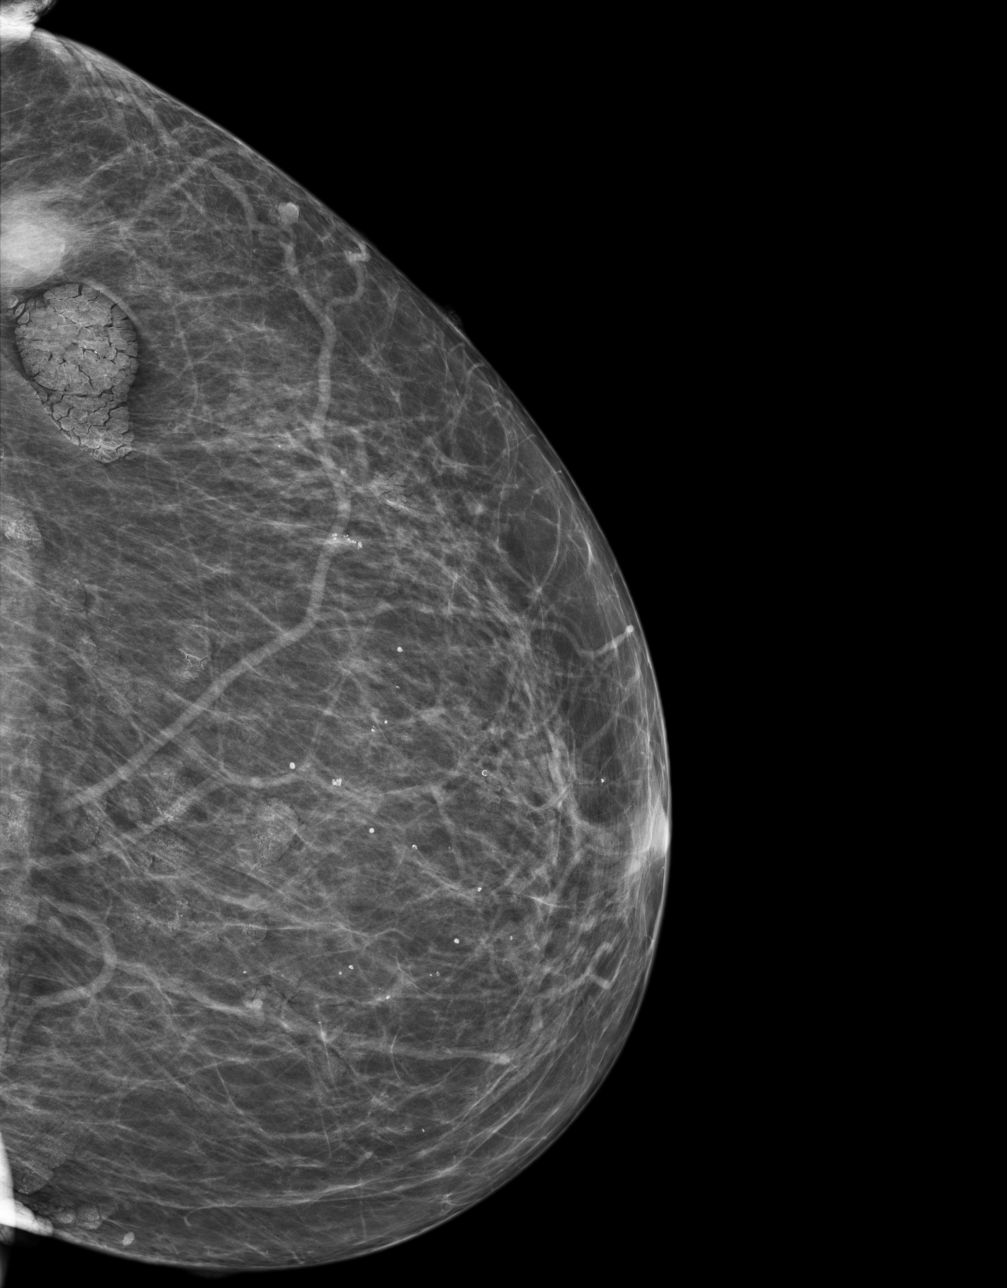
[im 2/3]
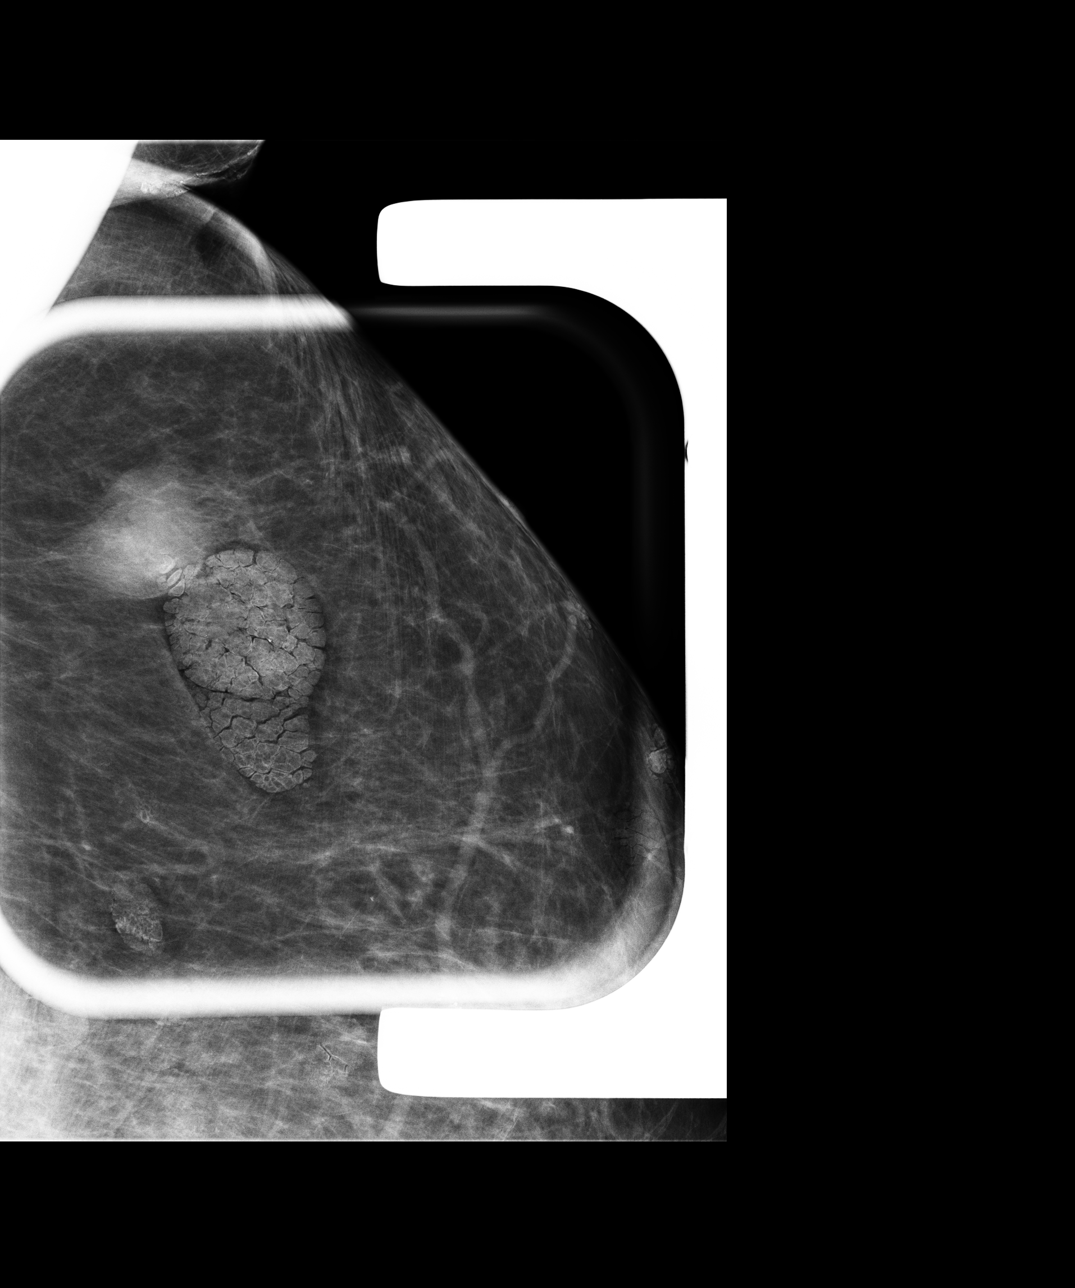
[im 3/3]
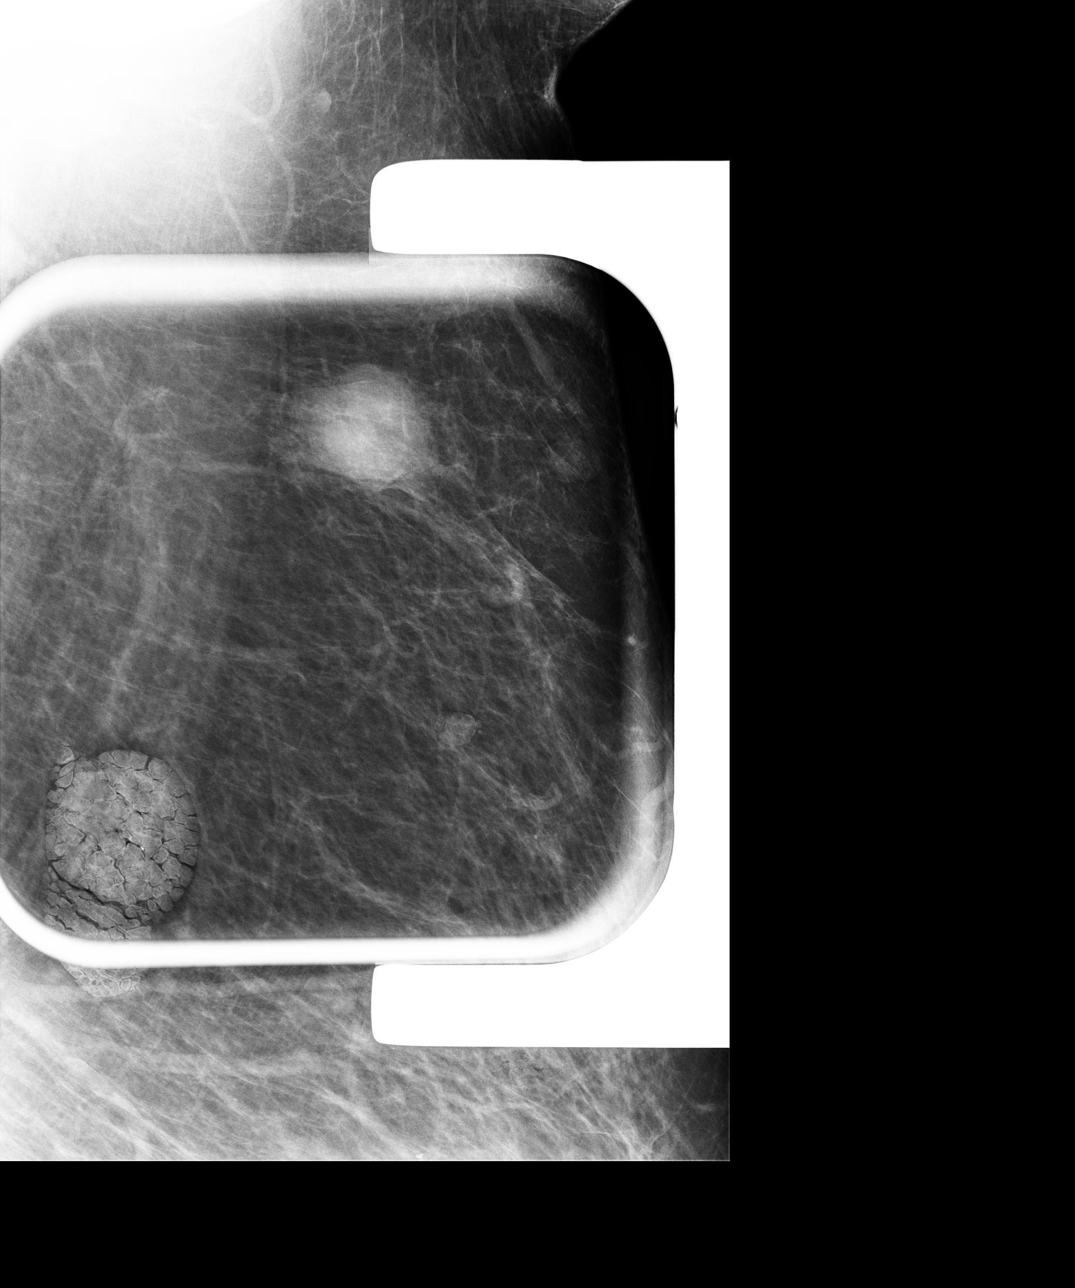

[3 of 3 positions shown; findings below may reference images not displayed]

FINDINGS: Additional spot views of the axillary region of left breast confirm the presence of a 2 cm size partly poorly defined, high-density nodule in the lower left axillary position.  This is confirmed compatible with the mammographic study of 05/10/2022 and not present on earlier studies.

High-resolution ultrasound of the left breast confirms the presence of lobulated, solid nodule with well-circumscribed margins in the lower left axillary position 2 cm in diameter with increased color flow on color Doppler. Small area of cystic necrosis is noted within this lesion.
IMPRESSION: 1. Further evaluation of this lesion with ultrasound guided core needle biopsy is recommended.

2. BIRADS 4, suspicious abnormality in the upper outer quadrant of the left breast in lower left axillary position requiring biopsy.

Final Assessment Code:

BI-RADS 0
 Need additional imaging evaluation.

BI-RADS 1
 Negative mammogram.

BI-RADS 2
 Benign finding.

BI-RADS 3
 Probably benign finding; short-interval follow-up suggested.

BI-RADS 4
 Suspicious abnormality; biopsy should be considered.

BI-RADS 5
 Highly suggestive of malignancy; appropriate action should be taken.

BI-RADS 6
 Known biopsy-proven malignancy; appropriate action should be taken.

NOTE:
In compliance with Federal regulations, the results of this mammogram are being sent to the patient.

## 2022-05-29 ENCOUNTER — Other Ambulatory Visit: Payer: Self-pay

## 2022-05-29 ENCOUNTER — Emergency Department (HOSPITAL_BASED_OUTPATIENT_CLINIC_OR_DEPARTMENT_OTHER): Payer: Medicare Other

## 2022-05-29 ENCOUNTER — Inpatient Hospital Stay (HOSPITAL_COMMUNITY): Payer: Medicare Other | Admitting: HOSPITALIST

## 2022-05-29 ENCOUNTER — Inpatient Hospital Stay
Admission: EM | Admit: 2022-05-29 | Discharge: 2022-06-01 | DRG: 313 | Disposition: A | Discharge status: Home / Self Care | Payer: Medicare Other | Attending: Internal Medicine | Admitting: Internal Medicine

## 2022-05-29 ENCOUNTER — Encounter (HOSPITAL_BASED_OUTPATIENT_CLINIC_OR_DEPARTMENT_OTHER): Payer: Self-pay

## 2022-05-29 DIAGNOSIS — N179 Acute kidney failure, unspecified: Secondary | ICD-10-CM | POA: Diagnosis present

## 2022-05-29 DIAGNOSIS — Z8542 Personal history of malignant neoplasm of other parts of uterus: Secondary | ICD-10-CM

## 2022-05-29 DIAGNOSIS — R5381 Other malaise: Secondary | ICD-10-CM | POA: Diagnosis present

## 2022-05-29 DIAGNOSIS — E119 Type 2 diabetes mellitus without complications: Secondary | ICD-10-CM | POA: Diagnosis present

## 2022-05-29 DIAGNOSIS — Z7984 Long term (current) use of oral hypoglycemic drugs: Secondary | ICD-10-CM

## 2022-05-29 DIAGNOSIS — R790 Abnormal level of blood mineral: Secondary | ICD-10-CM

## 2022-05-29 DIAGNOSIS — Z8249 Family history of ischemic heart disease and other diseases of the circulatory system: Secondary | ICD-10-CM

## 2022-05-29 DIAGNOSIS — Z90710 Acquired absence of both cervix and uterus: Secondary | ICD-10-CM

## 2022-05-29 DIAGNOSIS — Z85118 Personal history of other malignant neoplasm of bronchus and lung: Secondary | ICD-10-CM

## 2022-05-29 DIAGNOSIS — I10 Essential (primary) hypertension: Secondary | ICD-10-CM | POA: Diagnosis present

## 2022-05-29 DIAGNOSIS — R001 Bradycardia, unspecified: Secondary | ICD-10-CM | POA: Diagnosis present

## 2022-05-29 DIAGNOSIS — I82409 Acute embolism and thrombosis of unspecified deep veins of unspecified lower extremity: Secondary | ICD-10-CM

## 2022-05-29 DIAGNOSIS — Z8711 Personal history of peptic ulcer disease: Secondary | ICD-10-CM

## 2022-05-29 DIAGNOSIS — K219 Gastro-esophageal reflux disease without esophagitis: Secondary | ICD-10-CM | POA: Diagnosis present

## 2022-05-29 DIAGNOSIS — Z1152 Encounter for screening for COVID-19: Secondary | ICD-10-CM

## 2022-05-29 DIAGNOSIS — Z87891 Personal history of nicotine dependence: Secondary | ICD-10-CM

## 2022-05-29 DIAGNOSIS — R0789 Other chest pain: Principal | ICD-10-CM | POA: Diagnosis present

## 2022-05-29 DIAGNOSIS — E785 Hyperlipidemia, unspecified: Secondary | ICD-10-CM | POA: Diagnosis present

## 2022-05-29 DIAGNOSIS — Z79899 Other long term (current) drug therapy: Secondary | ICD-10-CM

## 2022-05-29 DIAGNOSIS — R9439 Abnormal result of other cardiovascular function study: Secondary | ICD-10-CM | POA: Diagnosis present

## 2022-05-29 DIAGNOSIS — I82412 Acute embolism and thrombosis of left femoral vein: Secondary | ICD-10-CM | POA: Diagnosis present

## 2022-05-29 DIAGNOSIS — R079 Chest pain, unspecified: Secondary | ICD-10-CM | POA: Diagnosis present

## 2022-05-29 DIAGNOSIS — Z902 Acquired absence of lung [part of]: Secondary | ICD-10-CM

## 2022-05-29 DIAGNOSIS — E860 Dehydration: Secondary | ICD-10-CM

## 2022-05-29 HISTORY — DX: Unspecified macular degeneration: H35.30

## 2022-05-29 HISTORY — DX: Essential (primary) hypertension: I10

## 2022-05-29 HISTORY — DX: Type 2 diabetes mellitus without complications: E11.9

## 2022-05-29 LAB — COMPREHENSIVE METABOLIC PANEL, NON-FASTING
ALBUMIN/GLOBULIN RATIO: 1.2 (ref 0.8–1.4)
ALBUMIN: 3.5 g/dL (ref 3.4–5.0)
ALKALINE PHOSPHATASE: 121 U/L — ABNORMAL HIGH (ref 46–116)
ALT (SGPT): 19 U/L (ref <=78)
ANION GAP: 6 mmol/L (ref 4–13)
AST (SGOT): 17 U/L (ref 15–37)
BILIRUBIN TOTAL: 0.4 mg/dL (ref 0.2–1.0)
BUN/CREA RATIO: 16
BUN: 20 mg/dL — ABNORMAL HIGH (ref 7–18)
CALCIUM, CORRECTED: 10.4 mg/dL
CALCIUM: 10 mg/dL (ref 8.5–10.1)
CHLORIDE: 103 mmol/L (ref 98–107)
CO2 TOTAL: 31 mmol/L (ref 21–32)
CREATININE: 1.22 mg/dL — ABNORMAL HIGH (ref 0.55–1.02)
ESTIMATED GFR: 45 mL/min/{1.73_m2} — ABNORMAL LOW (ref >59)
GLOBULIN: 3
GLUCOSE: 136 mg/dL — ABNORMAL HIGH (ref 74–106)
OSMOLALITY, CALCULATED: 284 mOsm/kg (ref 270–290)
POTASSIUM: 5 mmol/L (ref 3.5–5.1)
PROTEIN TOTAL: 6.5 g/dL (ref 6.4–8.2)
SODIUM: 140 mmol/L (ref 136–145)

## 2022-05-29 LAB — URINALYSIS, MACRO/MICRO
BILIRUBIN: NEGATIVE mg/dL
BLOOD: NEGATIVE mg/dL
GLUCOSE: NEGATIVE mg/dL
KETONES: NEGATIVE mg/dL
LEUKOCYTES: NEGATIVE WBCs/uL
NITRITE: NEGATIVE
PH: 6 (ref 4.6–8.0)
PROTEIN: NEGATIVE mg/dL
SPECIFIC GRAVITY: 1.025 (ref 1.003–1.035)
UROBILINOGEN: 0.2 mg/dL (ref 0.2–1.0)

## 2022-05-29 LAB — COVID-19, FLU A/B, RSV RAPID BY PCR
INFLUENZA VIRUS TYPE A: NOT DETECTED
INFLUENZA VIRUS TYPE B: NOT DETECTED
RESPIRATORY SYNCTIAL VIRUS (RSV): NOT DETECTED
SARS-CoV-2: NOT DETECTED

## 2022-05-29 LAB — CBC WITH DIFF
BASOPHIL #: 0.02 10*3/uL (ref 0.00–0.30)
BASOPHIL %: 0 % (ref 0–3)
EOSINOPHIL #: 0.09 10*3/uL (ref 0.00–0.80)
EOSINOPHIL %: 2 % (ref 0–7)
HCT: 37.1 % (ref 37.0–47.0)
HGB: 11.7 g/dL — ABNORMAL LOW (ref 12.5–16.0)
LYMPHOCYTE #: 1.7 10*3/uL (ref 1.10–5.00)
LYMPHOCYTE %: 34 % (ref 25–45)
MCH: 29.4 pg (ref 27.0–32.0)
MCHC: 31.6 g/dL — ABNORMAL LOW (ref 32.0–36.0)
MCV: 93.1 fL (ref 78.0–99.0)
MONOCYTE #: 0.37 10*3/uL (ref 0.00–1.30)
MONOCYTE %: 7 % (ref 0–12)
MPV: 8.7 fL (ref 7.4–10.4)
NEUTROPHIL #: 2.79 10*3/uL (ref 1.80–8.40)
NEUTROPHIL %: 56 % (ref 40–76)
PLATELETS: 156 10*3/uL (ref 140–440)
RBC: 3.99 10*6/uL — ABNORMAL LOW (ref 4.20–5.40)
RDW: 16 % — ABNORMAL HIGH (ref 11.6–14.8)
WBC: 5 10*3/uL (ref 4.0–10.5)

## 2022-05-29 LAB — TROPONIN-I
TROPONIN I: 3 ng/L (ref <15)
TROPONIN I: 3 ng/L (ref <15)

## 2022-05-29 LAB — PT/INR
INR: 1.12 — ABNORMAL HIGH (ref 0.88–1.10)
PROTHROMBIN TIME: 13 seconds — ABNORMAL HIGH (ref 9.8–12.7)

## 2022-05-29 LAB — MAGNESIUM: MAGNESIUM: 1.2 mg/dL — ABNORMAL LOW (ref 1.8–2.4)

## 2022-05-29 LAB — D-DIMER: D-DIMER: 1167 ng/mL DDU (ref <=232)

## 2022-05-29 LAB — ECG 12 LEAD
Calculated P Axis: 100 degrees
Calculated R Axis: 106 degrees
QRS Duration: 90 ms
QT Interval: 406 ms
QTC Calculation: 370 ms

## 2022-05-29 LAB — PTT (PARTIAL THROMBOPLASTIN TIME): APTT: 29.5 seconds (ref 22.0–31.7)

## 2022-05-29 MED ORDER — ASPIRIN 81 MG CHEWABLE TABLET
CHEWABLE_TABLET | ORAL | Status: AC
Start: 2022-05-29 — End: 2022-05-29
  Filled 2022-05-29: qty 4

## 2022-05-29 MED ORDER — MAGNESIUM SULFATE 1 GRAM/100 ML IN DEXTROSE 5 % INTRAVENOUS PIGGYBACK
INJECTION | INTRAVENOUS | Status: AC
Start: 2022-05-29 — End: 2022-05-29
  Filled 2022-05-29: qty 300

## 2022-05-29 MED ORDER — NITROGLYCERIN 2 % TRANSDERMAL OINTMENT - PACKET
0.5000 [in_us] | TOPICAL_OINTMENT | TRANSDERMAL | Status: DC
Start: 2022-05-29 — End: 2022-05-30
  Administered 2022-05-29: 0 [in_us] via TOPICAL

## 2022-05-29 MED ORDER — LACTATED RINGERS IV BOLUS
1000.0000 mL | INJECTION | Status: AC
Start: 2022-05-29 — End: 2022-05-29
  Administered 2022-05-29: 0 mL via INTRAVENOUS
  Administered 2022-05-29: 1000 mL via INTRAVENOUS

## 2022-05-29 MED ORDER — IOHEXOL 350 MG IODINE/ML INTRAVENOUS SOLUTION
100.0000 mL | INTRAVENOUS | Status: AC
Start: 2022-05-29 — End: 2022-05-29
  Administered 2022-05-29: 100 mL via INTRAVENOUS

## 2022-05-29 MED ORDER — MAGNESIUM SULFATE 1 GRAM/100 ML IN DEXTROSE 5 % INTRAVENOUS PIGGYBACK
1.0000 g | INJECTION | Freq: Once | INTRAVENOUS | Status: AC
Start: 2022-05-30 — End: 2022-05-30
  Administered 2022-05-29: 1 g via INTRAVENOUS
  Administered 2022-05-30: 0 g via INTRAVENOUS

## 2022-05-29 MED ORDER — MAGNESIUM SULFATE 1 GRAM/100 ML IN DEXTROSE 5 % INTRAVENOUS PIGGYBACK
1.0000 g | INJECTION | Freq: Once | INTRAVENOUS | Status: AC
Start: 2022-05-30 — End: 2022-05-30
  Administered 2022-05-30: 0 g via INTRAVENOUS
  Administered 2022-05-30: 1 g via INTRAVENOUS

## 2022-05-29 MED ORDER — MAGNESIUM SULFATE 1 GRAM/100 ML IN DEXTROSE 5 % INTRAVENOUS PIGGYBACK
1.0000 g | INJECTION | Freq: Once | INTRAVENOUS | Status: AC
Start: 2022-05-30 — End: 2022-05-29
  Administered 2022-05-29: 0 g via INTRAVENOUS
  Administered 2022-05-29: 1 g via INTRAVENOUS

## 2022-05-29 MED ORDER — LACTATED RINGERS IV BOLUS
1000.0000 mL | INJECTION | Status: AC
Start: 2022-05-29 — End: 2022-05-30
  Administered 2022-05-29: 1000 mL via INTRAVENOUS
  Administered 2022-05-30: 0 mL via INTRAVENOUS

## 2022-05-29 MED ORDER — NITROGLYCERIN 2 % TRANSDERMAL OINTMENT - PACKET
TOPICAL_OINTMENT | TRANSDERMAL | Status: AC
Start: 2022-05-29 — End: 2022-05-29
  Filled 2022-05-29: qty 1

## 2022-05-29 MED ORDER — ASPIRIN 81 MG CHEWABLE TABLET
324.0000 mg | CHEWABLE_TABLET | ORAL | Status: AC
Start: 2022-05-29 — End: 2022-05-29
  Administered 2022-05-29: 324 mg via ORAL

## 2022-05-29 NOTE — ED Attending Note (Incomplete)
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  PATIENT WITH CHEST TIGHTNESS NONRADIATING MIDSTERNAL BECAME DIAPHORETIC PER PATIENT AND HUSBAND.  PATIENT ADMITS TO SHORTNESS OF BREATH WITH THE CHEST TIGHTNESS SHE HAD NO NAUSEA OR VOMITING.  SHE HAS NO HISTORY OF CORONARY DISEASE IN THE PAST.  PRESENTLY PAIN-FREE.        Review of Systems     Review of Systems   HENT: Negative.     Eyes: Negative.    Respiratory:  Positive for chest tightness and shortness of breath.    Cardiovascular:  Positive for chest pain.   Genitourinary: Negative.    Neurological:  Positive for dizziness.   All other systems reviewed and are negative.      Previous History     Past Medical History:  Past Medical History:   Diagnosis Date   . Diabetes mellitus, type 2 (CMS HCC)    . HTN (hypertension)    . Macular degeneration (senile) of retina        Past Surgical History:  Past Surgical History:   Procedure Laterality Date   . Hx lobectomy     . Lung cancer surgery         Social History:  Social History     Tobacco Use   . Smoking status: Former     Types: Cigarettes   . Smokeless tobacco: Never   Substance Use Topics   . Alcohol use: Never   . Drug use: Never     Social History     Substance and Sexual Activity   Drug Use Never       Family History:  No family history on file.    Medication History:  Current Outpatient Medications   Medication Sig   . azithromycin (ZITHROMAX) 250 mg Oral Tablet Take 500 mg (2 tab) on day 1; take 250 mg (1 tab) on days 2-5.   . carvediloL (COREG) 25 mg Oral Tablet Take 1 Tablet (25 mg total) by mouth Twice daily with food   . esomeprazole magnesium (NEXIUM) 20 mg Oral Capsule, Delayed Release(E.C.) Take 1 Capsule (20 mg total) by mouth  Every morning before breakfast   . ezetimibe (ZETIA) 10 mg Oral Tablet Take 1 Tablet (10 mg total) by mouth Every evening   . famotidine (PEPCID) 40 mg Oral Tablet Take 1 Tablet (40 mg total) by mouth Twice daily   . metFORMIN (GLUCOPHAGE) 500 mg Oral Tablet Take 1 Tablet (500 mg total) by mouth Twice daily with food   . sertraline (ZOLOFT) 50 mg Oral Tablet Take 1 Tablet (50 mg total) by mouth Once a day   . telmisartan (MICARDIS) 80 mg Oral Tablet Take 1 Tablet (80 mg total) by mouth Once a day       Allergies:  Allergies   Allergen Reactions   . Ciprofloxacin Hives/ Urticaria       Physical Exam     Vitals:    BP (!) 146/83   Pulse 60   Temp 36.8 C (98.2 F)   Resp 18   Ht 1.626 m ('5\' 4"'$ )   Wt 93 kg (205 lb)   SpO2 100%   BMI 35.19 kg/m  Physical Exam  Vitals and nursing note reviewed.   Constitutional:       General: She is not in acute distress.     Appearance: Normal appearance. She is well-developed.   HENT:      Head: Normocephalic and atraumatic.      Right Ear: Tympanic membrane normal.      Left Ear: Tympanic membrane normal.      Mouth/Throat:      Mouth: Mucous membranes are moist.   Eyes:      Extraocular Movements: Extraocular movements intact.      Conjunctiva/sclera: Conjunctivae normal.      Pupils: Pupils are equal, round, and reactive to light.   Cardiovascular:      Rate and Rhythm: Regular rhythm. Bradycardia present.      Heart sounds: No murmur heard.  Pulmonary:      Effort: Pulmonary effort is normal. No respiratory distress.      Breath sounds: Normal breath sounds.   Abdominal:      General: Bowel sounds are normal.      Palpations: Abdomen is soft.      Tenderness: There is no abdominal tenderness.   Musculoskeletal:         General: No swelling.      Cervical back: Normal range of motion and neck supple.   Skin:     General: Skin is warm and dry.      Capillary Refill: Capillary refill takes less than 2 seconds.   Neurological:      General: No focal deficit present.       Mental Status: She is alert and oriented to person, place, and time.   Psychiatric:         Mood and Affect: Mood normal.         Behavior: Behavior normal.         Diagnostic Studies/Treatment     Medications:  Medications Administered in the ED   nitroGLYCERIN (NITRO-BID) 2 % topical ointment (has no administration in time range)   magnesium sulfate 1 G in D5W 100 mL premix IVPB (has no administration in time range)   magnesium sulfate 1 G in D5W 100 mL premix IVPB (has no administration in time range)   LR bolus infusion 1,000 mL (1,000 mL Intravenous New Bag/New Syringe 05/29/22 2303)   aspirin chewable tablet 324 mg (324 mg Oral Given 05/29/22 2159)   LR bolus infusion 1,000 mL (0 mL Intravenous Stopped 05/29/22 2230)   magnesium sulfate 1 G in D5W 100 mL premix IVPB (1 g Intravenous New Bag/New Syringe 05/29/22 2303)   iohexol (OMNIPAQUE 350) infusion (100 mL Intravenous Given 05/29/22 2309)       New Prescriptions    No medications on file       Labs:    Results for orders placed or performed during the hospital encounter of 05/29/22 (from the past 12 hour(s))   COMPREHENSIVE METABOLIC PANEL, NON-FASTING   Result Value Ref Range    SODIUM 140 136 - 145 mmol/L    POTASSIUM 5.0 3.5 - 5.1 mmol/L    CHLORIDE 103 98 - 107 mmol/L    CO2 TOTAL 31 21 - 32 mmol/L    ANION GAP 6 4 - 13 mmol/L    BUN 20 (H) 7 - 18 mg/dL    CREATININE 1.22 (H) 0.55 - 1.02 mg/dL    BUN/CREA RATIO 16     ESTIMATED GFR 45 (L) >59 mL/min/1.40m2    ALBUMIN 3.5 3.4 -  5.0 g/dL    CALCIUM 10.0 8.5 - 10.1 mg/dL    GLUCOSE 136 (H) 74 - 106 mg/dL    ALKALINE PHOSPHATASE 121 (H) 46 - 116 U/L    ALT (SGPT) 19 <=78 U/L    AST (SGOT) 17 15 - 37 U/L    BILIRUBIN TOTAL 0.4 0.2 - 1.0 mg/dL    PROTEIN TOTAL 6.5 6.4 - 8.2 g/dL    ALBUMIN/GLOBULIN RATIO 1.2 0.8 - 1.4    OSMOLALITY, CALCULATED 284 270 - 290 mOsm/kg    CALCIUM, CORRECTED 10.4 mg/dL    GLOBULIN 3.0    TROPONIN NOW   Result Value Ref Range    TROPONIN I 3 <15 ng/L   D-DIMER   Result Value Ref  Range    D-DIMER 1,167 (HH) <=232 ng/mL DDU   MAGNESIUM   Result Value Ref Range    MAGNESIUM 1.2 (L) 1.8 - 2.4 mg/dL   PT/INR   Result Value Ref Range    PROTHROMBIN TIME 13.0 (H) 9.8 - 12.7 seconds    INR 1.12 (H) 0.88 - 1.10   PTT (PARTIAL THROMBOPLASTIN TIME)   Result Value Ref Range    APTT 29.5 22.0 - 31.7 seconds   CBC WITH DIFF   Result Value Ref Range    WBC 5.0 4.0 - 10.5 x10^3/uL    RBC 3.99 (L) 4.20 - 5.40 x10^6/uL    HGB 11.7 (L) 12.5 - 16.0 g/dL    HCT 37.1 37.0 - 47.0 %    MCV 93.1 78.0 - 99.0 fL    MCH 29.4 27.0 - 32.0 pg    MCHC 31.6 (L) 32.0 - 36.0 g/dL    RDW 16.0 (H) 11.6 - 14.8 %    PLATELETS 156 140 - 440 x10^3/uL    MPV 8.7 7.4 - 10.4 fL    NEUTROPHIL % 56 40 - 76 %    LYMPHOCYTE % 34 25 - 45 %    MONOCYTE % 7 0 - 12 %    EOSINOPHIL % 2 0 - 7 %    BASOPHIL % 0 0 - 3 %    NEUTROPHIL # 2.79 1.80 - 8.40 x10^3/uL    LYMPHOCYTE # 1.70 1.10 - 5.00 x10^3/uL    MONOCYTE # 0.37 0.00 - 1.30 x10^3/uL    EOSINOPHIL # 0.09 0.00 - 0.80 x10^3/uL    BASOPHIL # 0.02 0.00 - 0.30 x10^3/uL   COVID-19, FLU A/B, RSV RAPID BY PCR   Result Value Ref Range    SARS-CoV-2 Not Detected Not Detected    INFLUENZA VIRUS TYPE A Not Detected Not Detected    INFLUENZA VIRUS TYPE B Not Detected Not Detected    RESPIRATORY SYNCTIAL VIRUS (RSV) Not Detected Not Detected   TROPONIN IN ONE HOUR   Result Value Ref Range    TROPONIN I 3 <15 ng/L   URINALYSIS, MACRO/MICRO   Result Value Ref Range    COLOR Yellow Yellow    APPEARANCE Clear Clear    SPECIFIC GRAVITY 1.025 1.003 - 1.035    PH 6.0 4.6 - 8.0    LEUKOCYTES Negative Negative WBCs/uL    NITRITE Negative Negative    PROTEIN Negative Negative mg/dL    GLUCOSE Negative Negative mg/dL    KETONES Negative Negative mg/dL    BILIRUBIN Negative Negative mg/dL    BLOOD Negative Negative mg/dL    UROBILINOGEN 0.2 0.2 - 1.0 mg/dL        Radiology:  XR CHEST AP  CT ANGIO  CHEST FOR PULMONARY EMBOLUS W IV CONTRAST    CT ANGIO CHEST FOR PULMONARY EMBOLUS W IV CONTRAST   Final Result   No  pulmonary embolus detected   Posttreatment changes in the right chest   No acute pleural disease. No acute infiltrates         One or more dose reduction techniques were used (e.g., Automated exposure control, adjustment of the mA and/or kV according to patient size, use of iterative reconstruction technique).         Radiologist location ID: [id number]         XR CHEST AP   Final Result   NO ACTIVE INFILTRATE            Radiologist location ID: [id number]             ECG:   SINUS BRADYCARDIA 50 BEATS PER MINUTE RIGHTWARD AXIS PR INTERVAL 124            Differential diagnosis  1. CHEST PAIN 2. HIATAL HERNIA 3.  SYNCOPAL EPISODE     Course/Disposition/Plan     Course:    PATIENT WITH CHEST PAIN DIAPHORETIC PAIN NOW RESOLVED    Disposition:    Data Unavailable    Condition at Disposition:   STABLE    Follow up:   No follow-up provider specified.    Clinical Impression:     Clinical Impression   Chest pain, unspecified type (Primary)         Winfred Burn, MD

## 2022-05-29 NOTE — ED Triage Notes (Signed)
EMS reports called @ 2035 with Cp/SOB, dizziness. Pt found in sinus brady. Currently denies CP, SOB

## 2022-05-29 NOTE — ED Attending Note (Addendum)
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  PATIENT WITH CHEST TIGHTNESS NONRADIATING MIDSTERNAL BECAME DIAPHORETIC PER PATIENT AND HUSBAND.  PATIENT ADMITS TO SHORTNESS OF BREATH WITH THE CHEST TIGHTNESS SHE HAD NO NAUSEA OR VOMITING.  SHE HAS NO HISTORY OF CORONARY DISEASE IN THE PAST.  PRESENTLY PAIN-FREE.        Review of Systems     Review of Systems   HENT: Negative.     Eyes: Negative.    Respiratory:  Positive for chest tightness and shortness of breath.    Cardiovascular:  Positive for chest pain.   Genitourinary: Negative.    Neurological:  Positive for dizziness.   All other systems reviewed and are negative.      Previous History     Past Medical History:  Past Medical History:   Diagnosis Date   . Diabetes mellitus, type 2 (CMS HCC)    . HTN (hypertension)    . Macular degeneration (senile) of retina        Past Surgical History:  Past Surgical History:   Procedure Laterality Date   . Hx lobectomy     . Lung cancer surgery         Social History:  Social History     Tobacco Use   . Smoking status: Former     Types: Cigarettes   . Smokeless tobacco: Never   Substance Use Topics   . Alcohol use: Never   . Drug use: Never     Social History     Substance and Sexual Activity   Drug Use Never       Family History:  No family history on file.    Medication History:  Current Outpatient Medications   Medication Sig   . azithromycin (ZITHROMAX) 250 mg Oral Tablet Take 500 mg (2 tab) on day 1; take 250 mg (1 tab) on days 2-5.   . carvediloL (COREG) 25 mg Oral Tablet Take 1 Tablet (25 mg total) by mouth Twice daily with food   . esomeprazole magnesium (NEXIUM) 20 mg Oral Capsule, Delayed Release(E.C.) Take 1 Capsule (20 mg total) by mouth  Every morning before breakfast   . ezetimibe (ZETIA) 10 mg Oral Tablet Take 1 Tablet (10 mg total) by mouth Every evening   . famotidine (PEPCID) 40 mg Oral Tablet Take 1 Tablet (40 mg total) by mouth Twice daily   . metFORMIN (GLUCOPHAGE) 500 mg Oral Tablet Take 1 Tablet (500 mg total) by mouth Twice daily with food   . sertraline (ZOLOFT) 50 mg Oral Tablet Take 1 Tablet (50 mg total) by mouth Once a day   . telmisartan (MICARDIS) 80 mg Oral Tablet Take 1 Tablet (80 mg total) by mouth Once a day       Allergies:  Allergies   Allergen Reactions   . Ciprofloxacin Hives/ Urticaria       Physical Exam     Vitals:    BP (!) 146/83   Pulse 60   Temp 36.8 C (98.2 F)   Resp 18   Ht 1.626 m ('5\' 4"'$ )   Wt 93 kg (205 lb)   SpO2 100%   BMI 35.19 kg/m  Physical Exam  Vitals and nursing note reviewed.   Constitutional:       General: She is not in acute distress.     Appearance: Normal appearance. She is well-developed.   HENT:      Head: Normocephalic and atraumatic.      Right Ear: Tympanic membrane normal.      Left Ear: Tympanic membrane normal.      Mouth/Throat:      Mouth: Mucous membranes are moist.   Eyes:      Extraocular Movements: Extraocular movements intact.      Conjunctiva/sclera: Conjunctivae normal.      Pupils: Pupils are equal, round, and reactive to light.   Cardiovascular:      Rate and Rhythm: Regular rhythm. Bradycardia present.      Heart sounds: No murmur heard.  Pulmonary:      Effort: Pulmonary effort is normal. No respiratory distress.      Breath sounds: Normal breath sounds.   Abdominal:      General: Bowel sounds are normal.      Palpations: Abdomen is soft.      Tenderness: There is no abdominal tenderness.   Musculoskeletal:         General: No swelling.      Cervical back: Normal range of motion and neck supple.   Skin:     General: Skin is warm and dry.      Capillary Refill: Capillary refill takes less than 2 seconds.   Neurological:      General: No focal deficit present.       Mental Status: She is alert and oriented to person, place, and time.   Psychiatric:         Mood and Affect: Mood normal.         Behavior: Behavior normal.         Diagnostic Studies/Treatment     Medications:  Medications Administered in the ED   nitroGLYCERIN (NITRO-BID) 2 % topical ointment (has no administration in time range)   magnesium sulfate 1 G in D5W 100 mL premix IVPB (1 g Intravenous New Bag/New Syringe 05/29/22 2359)   magnesium sulfate 1 G in D5W 100 mL premix IVPB (has no administration in time range)   LR bolus infusion 1,000 mL (1,000 mL Intravenous New Bag/New Syringe 05/29/22 2303)   aspirin chewable tablet 324 mg (324 mg Oral Given 05/29/22 2159)   LR bolus infusion 1,000 mL (0 mL Intravenous Stopped 05/29/22 2230)   magnesium sulfate 1 G in D5W 100 mL premix IVPB (0 g Intravenous Stopped 05/29/22 2333)   iohexol (OMNIPAQUE 350) infusion (100 mL Intravenous Given 05/29/22 2309)       New Prescriptions    No medications on file       Labs:    Results for orders placed or performed during the hospital encounter of 05/29/22 (from the past 12 hour(s))   COMPREHENSIVE METABOLIC PANEL, NON-FASTING   Result Value Ref Range    SODIUM 140 136 - 145 mmol/L    POTASSIUM 5.0 3.5 - 5.1 mmol/L    CHLORIDE 103 98 - 107 mmol/L    CO2 TOTAL 31 21 - 32 mmol/L    ANION GAP 6 4 - 13 mmol/L    BUN 20 (H) 7 - 18 mg/dL    CREATININE 1.22 (H) 0.55 - 1.02 mg/dL    BUN/CREA RATIO 16     ESTIMATED GFR 45 (L) >59 mL/min/1.25m^2    ALBUMIN 3.5 3.4 -  5.0 g/dL    CALCIUM 10.0 8.5 - 10.1 mg/dL    GLUCOSE 136 (H) 74 - 106 mg/dL    ALKALINE PHOSPHATASE 121 (H) 46 - 116 U/L    ALT (SGPT) 19 <=78 U/L    AST (SGOT) 17 15 - 37 U/L    BILIRUBIN TOTAL 0.4 0.2 - 1.0 mg/dL    PROTEIN TOTAL 6.5 6.4 - 8.2 g/dL    ALBUMIN/GLOBULIN RATIO 1.2 0.8 - 1.4    OSMOLALITY, CALCULATED 284 270 - 290 mOsm/kg    CALCIUM, CORRECTED 10.4 mg/dL    GLOBULIN 3.0    TROPONIN NOW   Result Value Ref Range    TROPONIN I 3 <15 ng/L   D-DIMER   Result Value Ref  Range    D-DIMER 1,167 (HH) <=232 ng/mL DDU   MAGNESIUM   Result Value Ref Range    MAGNESIUM 1.2 (L) 1.8 - 2.4 mg/dL   PT/INR   Result Value Ref Range    PROTHROMBIN TIME 13.0 (H) 9.8 - 12.7 seconds    INR 1.12 (H) 0.88 - 1.10   PTT (PARTIAL THROMBOPLASTIN TIME)   Result Value Ref Range    APTT 29.5 22.0 - 31.7 seconds   CBC WITH DIFF   Result Value Ref Range    WBC 5.0 4.0 - 10.5 x10^3/uL    RBC 3.99 (L) 4.20 - 5.40 x10^6/uL    HGB 11.7 (L) 12.5 - 16.0 g/dL    HCT 37.1 37.0 - 47.0 %    MCV 93.1 78.0 - 99.0 fL    MCH 29.4 27.0 - 32.0 pg    MCHC 31.6 (L) 32.0 - 36.0 g/dL    RDW 16.0 (H) 11.6 - 14.8 %    PLATELETS 156 140 - 440 x10^3/uL    MPV 8.7 7.4 - 10.4 fL    NEUTROPHIL % 56 40 - 76 %    LYMPHOCYTE % 34 25 - 45 %    MONOCYTE % 7 0 - 12 %    EOSINOPHIL % 2 0 - 7 %    BASOPHIL % 0 0 - 3 %    NEUTROPHIL # 2.79 1.80 - 8.40 x10^3/uL    LYMPHOCYTE # 1.70 1.10 - 5.00 x10^3/uL    MONOCYTE # 0.37 0.00 - 1.30 x10^3/uL    EOSINOPHIL # 0.09 0.00 - 0.80 x10^3/uL    BASOPHIL # 0.02 0.00 - 0.30 x10^3/uL   COVID-19, FLU A/B, RSV RAPID BY PCR   Result Value Ref Range    SARS-CoV-2 Not Detected Not Detected    INFLUENZA VIRUS TYPE A Not Detected Not Detected    INFLUENZA VIRUS TYPE B Not Detected Not Detected    RESPIRATORY SYNCTIAL VIRUS (RSV) Not Detected Not Detected   TROPONIN IN ONE HOUR   Result Value Ref Range    TROPONIN I 3 <15 ng/L   URINALYSIS, MACRO/MICRO   Result Value Ref Range    COLOR Yellow Yellow    APPEARANCE Clear Clear    SPECIFIC GRAVITY 1.025 1.003 - 1.035    PH 6.0 4.6 - 8.0    LEUKOCYTES Negative Negative WBCs/uL    NITRITE Negative Negative    PROTEIN Negative Negative mg/dL    GLUCOSE Negative Negative mg/dL    KETONES Negative Negative mg/dL    BILIRUBIN Negative Negative mg/dL    BLOOD Negative Negative mg/dL    UROBILINOGEN 0.2 0.2 - 1.0 mg/dL        Radiology:  XR CHEST AP  CT ANGIO  CHEST FOR PULMONARY EMBOLUS W IV CONTRAST    CT ANGIO CHEST FOR PULMONARY EMBOLUS W IV CONTRAST   Final Result   No  pulmonary embolus detected   Posttreatment changes in the right chest   No acute pleural disease. No acute infiltrates         One or more dose reduction techniques were used (e.g., Automated exposure control, adjustment of the mA and/or kV according to patient size, use of iterative reconstruction technique).         Radiologist location ID: [id number]         XR CHEST AP   Final Result   NO ACTIVE INFILTRATE            Radiologist location ID: [id number]             ECG:   SINUS BRADYCARDIA 50 BEATS PER MINUTE RIGHTWARD AXIS PR INTERVAL 124            Differential diagnosis  1. CHEST PAIN 2. HIATAL HERNIA 3.  SYNCOPAL EPISODE     Course/Disposition/Plan     Course:     ED Course as of 05/30/22 0151   Thu May 30, 2022   0117 PATIENT IS IN NO DISTRESS PRESENTLY NO FURTHER CHEST PAIN TROPONIN IS NEGATIVE GIVEN PATIENT'S AGE AND SYMPTOMS ON PRESENTATION WILL DISCUSS WITH HOSPITALIST ABOUT INPATIENT MONITORING OBSERVATION CARDIOLOGY CONSULT   0150 PATIENT ACCEPTED FOR TRANSFER TO [facility]     PATIENT WITH CHEST PAIN DIAPHORETIC PAIN NOW RESOLVED    Disposition:    Admitted    Condition at Disposition:   STABLE    Follow up:   No follow-up provider specified.    Clinical Impression:     Clinical Impression   Chest pain, unspecified type (Primary)   Low magnesium level   Dehydration         Winfred Burn, MD

## 2022-05-29 NOTE — ED Nurses Note (Signed)
Patient states she is pain free at this time.  MD informed and discussed nitropaste.  Held at this time.

## 2022-05-29 NOTE — ED Nurses Note (Signed)
Criticial D Dimer reported Dr. Rise Patience

## 2022-05-30 ENCOUNTER — Inpatient Hospital Stay (HOSPITAL_COMMUNITY): Payer: Medicare Other

## 2022-05-30 ENCOUNTER — Encounter (HOSPITAL_COMMUNITY): Payer: Self-pay | Admitting: HOSPITALIST

## 2022-05-30 DIAGNOSIS — E119 Type 2 diabetes mellitus without complications: Secondary | ICD-10-CM

## 2022-05-30 DIAGNOSIS — R7989 Other specified abnormal findings of blood chemistry: Secondary | ICD-10-CM

## 2022-05-30 DIAGNOSIS — R079 Chest pain, unspecified: Secondary | ICD-10-CM | POA: Diagnosis present

## 2022-05-30 DIAGNOSIS — R0789 Other chest pain: Principal | ICD-10-CM

## 2022-05-30 DIAGNOSIS — I82412 Acute embolism and thrombosis of left femoral vein: Secondary | ICD-10-CM

## 2022-05-30 DIAGNOSIS — K219 Gastro-esophageal reflux disease without esophagitis: Secondary | ICD-10-CM

## 2022-05-30 DIAGNOSIS — Z85118 Personal history of other malignant neoplasm of bronchus and lung: Secondary | ICD-10-CM | POA: Insufficient documentation

## 2022-05-30 DIAGNOSIS — Z7984 Long term (current) use of oral hypoglycemic drugs: Secondary | ICD-10-CM

## 2022-05-30 DIAGNOSIS — K449 Diaphragmatic hernia without obstruction or gangrene: Secondary | ICD-10-CM

## 2022-05-30 DIAGNOSIS — I82402 Acute embolism and thrombosis of unspecified deep veins of left lower extremity: Secondary | ICD-10-CM

## 2022-05-30 DIAGNOSIS — I10 Essential (primary) hypertension: Secondary | ICD-10-CM

## 2022-05-30 DIAGNOSIS — R9431 Abnormal electrocardiogram [ECG] [EKG]: Secondary | ICD-10-CM

## 2022-05-30 DIAGNOSIS — N179 Acute kidney failure, unspecified: Secondary | ICD-10-CM

## 2022-05-30 LAB — CBC WITH DIFF
HCT: 34.9 % — ABNORMAL LOW (ref 37.0–47.0)
HGB: 11.2 g/dL — ABNORMAL LOW (ref 12.5–16.0)
MCH: 29.7 pg (ref 27.0–32.0)
MCHC: 32.1 g/dL (ref 32.0–36.0)
MCV: 92.4 fL (ref 78.0–99.0)
MPV: 8.5 fL (ref 7.4–10.4)
PLATELETS: 137 10*3/uL — ABNORMAL LOW (ref 140–440)
RBC: 3.78 10*6/uL — ABNORMAL LOW (ref 4.20–5.40)
RDW: 16.1 % — ABNORMAL HIGH (ref 11.6–14.8)
WBC: 4 10*3/uL (ref 4.0–10.5)

## 2022-05-30 LAB — CBC
HCT: 38.5 % (ref 31.2–41.9)
HGB: 12.8 g/dL (ref 10.9–14.3)
MCH: 30.3 pg (ref 24.7–32.8)
MCHC: 33.3 g/dL (ref 32.3–35.6)
MCV: 90.8 fL (ref 75.5–95.3)
MPV: 9.4 fL (ref 7.9–10.8)
PLATELETS: 112 10*3/uL — ABNORMAL LOW (ref 140–440)
RBC: 4.24 10*6/uL (ref 3.63–4.92)
RDW: 14.1 % (ref 12.3–17.7)
WBC: 3.5 10*3/uL — ABNORMAL LOW (ref 3.8–11.8)

## 2022-05-30 LAB — LIPID PANEL
CHOL/HDL RATIO: 3.7
CHOLESTEROL: 187 mg/dL (ref <200)
HDL CHOL: 51 mg/dL (ref 23–92)
LDL CALC: 124 mg/dL — ABNORMAL HIGH (ref 0–100)
TRIGLYCERIDES: 60 mg/dL (ref <=150)
VLDL CALC: 12 mg/dL (ref 0–50)

## 2022-05-30 LAB — MANUAL DIFFERENTIAL
BASOPHIL %: 1 % (ref 0–3)
BASOPHIL ABSOLUTE: 0.04 10*3/uL (ref 0.00–0.30)
BASOPHILS MANUAL: 1
EOSINOPHIL %: 2 % (ref 0–7)
EOSINOPHIL ABSOLUTE: 0.08 10*3/uL (ref 0.00–0.80)
EOSINOPHILS MANUAL: 2
LYMPHOCYTE %: 38 % (ref 25–45)
LYMPHOCYTE ABSOLUTE: 1.52 10*3/uL (ref 1.10–5.00)
LYMPHOCYTES MANUAL: 38
MONOCYTE %: 3 % (ref 0–12)
MONOCYTE ABSOLUTE: 0.12 10*3/uL (ref 0.00–1.30)
MONOCYTES MANUAL: 3
NEUTROPHIL %: 56 % (ref 40–76)
NEUTROPHIL ABSOLUTE: 2.24 10*3/uL (ref 1.80–8.40)
NEUTROPHILS MANUAL: 56
TOTAL CELLS COUNTED [#] IN BLOOD: 100
WBC: 4 10*3/uL

## 2022-05-30 LAB — BASIC METABOLIC PANEL
ANION GAP: 7 mmol/L (ref 4–13)
BUN/CREA RATIO: 17
BUN: 15 mg/dL (ref 7–18)
CALCIUM: 9.5 mg/dL (ref 8.5–10.1)
CHLORIDE: 104 mmol/L (ref 98–107)
CO2 TOTAL: 30 mmol/L (ref 21–32)
CREATININE: 0.9 mg/dL (ref 0.55–1.02)
ESTIMATED GFR: 65 mL/min/{1.73_m2} (ref >59)
GLUCOSE: 94 mg/dL (ref 74–106)
OSMOLALITY, CALCULATED: 282 mOsm/kg (ref 270–290)
POTASSIUM: 4.3 mmol/L (ref 3.5–5.1)
SODIUM: 141 mmol/L (ref 136–145)

## 2022-05-30 LAB — ECG 12 LEAD
Atrial Rate: 50 {beats}/min
Calculated T Axis: -6 degrees
PR Interval: 124 ms
Ventricular rate: 50 {beats}/min

## 2022-05-30 LAB — POC BLOOD GLUCOSE (RESULTS)
GLUCOSE, POC: 76 mg/dl (ref 70–100)
GLUCOSE, POC: 132 mg/dl — ABNORMAL HIGH (ref 70–100)
GLUCOSE, POC: 140 mg/dl — ABNORMAL HIGH (ref 70–100)

## 2022-05-30 LAB — TROPONIN-I
TROPONIN I: 4 ng/L (ref <15)
TROPONIN I: 7 ng/L (ref <15)

## 2022-05-30 LAB — MAGNESIUM: MAGNESIUM: 1.8 mg/dL (ref 1.8–2.4)

## 2022-05-30 LAB — MYOCARDIAL PERFUSION COMPLETE: Nuc Stress EF: 65 %

## 2022-05-30 LAB — PTT (PARTIAL THROMBOPLASTIN TIME): APTT: 35.6 seconds (ref 25.0–38.0)

## 2022-05-30 LAB — HGA1C (HEMOGLOBIN A1C WITH EST AVG GLUCOSE): HEMOGLOBIN A1C: 6 % (ref 4.0–6.0)

## 2022-05-30 MED ORDER — IPRATROPIUM 0.5 MG-ALBUTEROL 3 MG (2.5 MG BASE)/3 ML NEBULIZATION SOLN
3.0000 mL | INHALATION_SOLUTION | RESPIRATORY_TRACT | Status: DC | PRN
Start: 2022-05-30 — End: 2022-06-01

## 2022-05-30 MED ORDER — NITROGLYCERIN 0.4 MG SUBLINGUAL TABLET
0.4000 mg | SUBLINGUAL_TABLET | SUBLINGUAL | Status: DC | PRN
Start: 2022-05-30 — End: 2022-06-01

## 2022-05-30 MED ORDER — INSULIN LISPRO 100 UNIT/ML SUB-Q SSIP VIAL
0.0000 [IU] | INJECTION | Freq: Four times a day (QID) | SUBCUTANEOUS | Status: DC
Start: 2022-05-30 — End: 2022-06-01
  Administered 2022-05-30 – 2022-05-31 (×5): 0 [IU] via SUBCUTANEOUS
  Administered 2022-05-31: 3 [IU] via SUBCUTANEOUS
  Administered 2022-05-31: 2 [IU] via SUBCUTANEOUS
  Administered 2022-06-01: 0 [IU] via SUBCUTANEOUS
  Filled 2022-05-30: qty 2
  Filled 2022-05-30: qty 3

## 2022-05-30 MED ORDER — DEXTROSE 50 % IN WATER (D50W) INTRAVENOUS SYRINGE
12.5000 g | INJECTION | INTRAVENOUS | Status: DC | PRN
Start: 2022-05-30 — End: 2022-06-01

## 2022-05-30 MED ORDER — VALSARTAN 160 MG TABLET
160.0000 mg | ORAL_TABLET | Freq: Every day | ORAL | Status: DC
Start: 2022-05-30 — End: 2022-06-01
  Administered 2022-05-30 – 2022-06-01 (×3): 160 mg via ORAL
  Filled 2022-05-30 (×4): qty 1

## 2022-05-30 MED ORDER — SERTRALINE 50 MG TABLET
50.0000 mg | ORAL_TABLET | Freq: Every day | ORAL | Status: DC
Start: 2022-05-30 — End: 2022-06-01
  Administered 2022-05-30 – 2022-06-01 (×3): 50 mg via ORAL
  Filled 2022-05-30 (×3): qty 1

## 2022-05-30 MED ORDER — HEPARIN (PORCINE) 25,000 UNIT/250 ML (100 UNIT/ML) IN DEXTROSE 5 % IV
18.0000 [IU]/kg/h | INTRAVENOUS | Status: DC
Start: 2022-05-30 — End: 2022-05-31
  Administered 2022-05-30: 18 [IU]/kg/h via INTRAVENOUS
  Administered 2022-05-31 (×2): 0 [IU]/kg/h via INTRAVENOUS
  Administered 2022-05-31: 14 [IU]/kg/h via INTRAVENOUS
  Filled 2022-05-30: qty 250

## 2022-05-30 MED ORDER — CARVEDILOL 25 MG TABLET
25.0000 mg | ORAL_TABLET | Freq: Two times a day (BID) | ORAL | Status: DC
Start: 2022-05-30 — End: 2022-06-01

## 2022-05-30 MED ORDER — GLUCAGON 1 MG/ML SOLUTION FOR INJECTION
1.0000 mg | Freq: Once | INTRAMUSCULAR | Status: DC | PRN
Start: 2022-05-30 — End: 2022-06-01

## 2022-05-30 MED ORDER — REGADENOSON 0.4 MG/5 ML INTRAVENOUS SYRINGE
INJECTION | INTRAVENOUS | Status: AC
Start: 2022-05-30 — End: 2022-05-30
  Administered 2022-05-30: 0.4 mg via INTRAVENOUS
  Filled 2022-05-30: qty 5

## 2022-05-30 MED ORDER — ASPIRIN 81 MG CHEWABLE TABLET
81.0000 mg | CHEWABLE_TABLET | Freq: Every day | ORAL | Status: DC
Start: 2022-05-31 — End: 2022-06-01
  Administered 2022-05-31 – 2022-06-01 (×2): 81 mg via ORAL
  Filled 2022-05-30 (×2): qty 1

## 2022-05-30 MED ORDER — ENOXAPARIN 40 MG/0.4 ML SUBCUTANEOUS SYRINGE
40.0000 mg | INJECTION | SUBCUTANEOUS | Status: DC
Start: 2022-05-30 — End: 2022-05-30
  Filled 2022-05-30: qty 0.4

## 2022-05-30 MED ORDER — REGADENOSON 0.4 MG/5 ML INTRAVENOUS SYRINGE
0.4000 mg | INJECTION | Freq: Once | INTRAVENOUS | Status: AC
Start: 2022-05-30 — End: 2022-05-30

## 2022-05-30 MED ORDER — ACETAMINOPHEN 325 MG TABLET
650.0000 mg | ORAL_TABLET | ORAL | Status: DC | PRN
Start: 2022-05-30 — End: 2022-06-01

## 2022-05-30 MED ORDER — EZETIMIBE 10 MG TABLET
10.0000 mg | ORAL_TABLET | Freq: Every evening | ORAL | Status: DC
Start: 2022-05-30 — End: 2022-06-01
  Administered 2022-05-30 – 2022-05-31 (×2): 10 mg via ORAL
  Filled 2022-05-30 (×2): qty 1

## 2022-05-30 MED ORDER — DEXTROSE 40 % ORAL GEL
15.0000 g | ORAL | Status: DC | PRN
Start: 2022-05-30 — End: 2022-06-01

## 2022-05-30 MED ORDER — PANTOPRAZOLE 40 MG TABLET,DELAYED RELEASE
40.0000 mg | DELAYED_RELEASE_TABLET | Freq: Every day | ORAL | Status: DC
Start: 2022-05-30 — End: 2022-05-31
  Administered 2022-05-30 – 2022-05-31 (×2): 40 mg via ORAL
  Filled 2022-05-30 (×2): qty 1

## 2022-05-30 MED ORDER — ATORVASTATIN 40 MG TABLET
40.0000 mg | ORAL_TABLET | Freq: Every evening | ORAL | Status: DC
Start: 2022-05-30 — End: 2022-06-01
  Administered 2022-05-30 – 2022-05-31 (×2): 40 mg via ORAL
  Filled 2022-05-30 (×2): qty 1

## 2022-05-30 MED ORDER — HYDRALAZINE 20 MG/ML INJECTION SOLUTION
10.0000 mg | Freq: Four times a day (QID) | INTRAMUSCULAR | Status: DC | PRN
Start: 2022-05-30 — End: 2022-06-01

## 2022-05-30 MED ORDER — HEPARIN (PORCINE) 5,000 UNITS/ML BOLUS
80.0000 [IU]/kg | Freq: Once | INTRAMUSCULAR | Status: AC
Start: 2022-05-30 — End: 2022-05-30
  Administered 2022-05-30: 5500 [IU] via INTRAVENOUS
  Filled 2022-05-30: qty 2

## 2022-05-30 MED ORDER — FAMOTIDINE 40 MG TABLET
40.0000 mg | ORAL_TABLET | Freq: Two times a day (BID) | ORAL | Status: DC
Start: 2022-05-30 — End: 2022-05-31
  Administered 2022-05-30 – 2022-05-31 (×3): 40 mg via ORAL
  Filled 2022-05-30 (×3): qty 1

## 2022-05-30 NOTE — Progress Notes (Signed)
I interviewed her and examined free to [facility] 10 years ago she underwent coronary angiography at [facility] was found to have relatively unobstructed coronary artery.  She does have a hiatal hernia and has had issues with this in the past.  She did rule out for acute MI there were no important EKG changes except for T-wave inversion in limb lead 3.    Her stress test is low risk but indicative of possible posterior lateral ischemia.    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  This probably will need anticoagulation since its proximal and we would probably defer coronary angiography at the present time.      I did discuss this with nurse Huffman.    In the meantime I think medical therapy for presumed CAD with antiplatelet therapy aspirin beta-blocker if her heart rate is acceptable and statin therapy.    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    We will be available if there should be a change in her clinic with her today.  Any questions regarding this note please contact me

## 2022-05-30 NOTE — ED Nurses Note (Signed)
Report called to [facility].  [facility] here to transport patient.  VSS.  IV intact.  Leaving ER via stretcher in stable condition.

## 2022-05-30 NOTE — Telemedicine Consult (Signed)
[facility]  Cardiology   Consult      TELEMEDICINE CARDIOLOGY CONSULT NOTE    Telemedicine Documentation:   Modality: Video  Provider Physical Location: [place], [facility]  Patient/family aware of provider location: Yes  Patient/family consent for telemedicine: Yes  Examination observed and performed by: Isabell Jarvis, APRN,FNP-BC, Dr. Arther Abbott  Patient Location: [facility]    Name:  Kelsey Preston   DOB: [birth year]   MRN: [id number]   Date:  05/29/2022     PCP: Octaviano Batty, [place] Hospital Day:  LOS: 0 days   Chief Complaint: Chest Pain  and Shortness of Breath      IMPRESSION/PLAN:  Atypical chest pain versus angina   Hypomagnesemia  AKI, corrected   Hypertension    Cardiac workup thus far has been negative.  Her symptoms are concerning given that they are different than any prior she has had, she does have history of nonobstructive disease but this was greater than 10 years ago.  We will set patient up for stress test to evaluate further.  Also obtain lipid panel and A1c.  Restart telmisartan for blood pressure.  Hold carvedilol until after stress test.    HPI:  Kelsey Preston is a 82 y.o. female who presented with chest tightness, shortness of breath, diaphoresis, and nausea.  She had a single episode while she was sitting at home resting.  She describes it was sudden onset, she took 2 Excedrin because she did not have aspirin at home and also drank some cold water and her symptoms resolved immediately.  She does not feel like it was the Excedrin because her symptoms went away too quickly.  She thought maybe it was gas pain, but this felt different than any pain she has experienced.  She has a history of hiatal hernia and gastric ulcers as well.  She does have a history cardiac catheterization in 2012 performed due to abnormal stress test which showed minimal nonobstructive disease, no intervention.  EF at that time was 50-55%.  She also has a history of lung cancer  with lobectomy approximately 5 years ago, diabetes, hypertension, and GERD, endometrial cancer s/p hysterectomy and she recently had an enlarged lymph node in her left breast and armpit area which are still being worked up.  On admission, troponin negative x4.  Chest x-ray was negative.  CTA was negative for PE after elevated D-dimer of 1167.  Creatinine initially 1.22 but improved after L bolus of LR to 0.9.  Magnesium was low at 1.2 and has been replaced.  EKG showed sinus bradycardia with no acute ischemic changes.    Past Medical History:   Diagnosis Date    Diabetes mellitus, type 2 (CMS HCC)     HTN (hypertension)     Macular degeneration (senile) of retina          Patient Active Problem List    Diagnosis Date Noted    Chest pain 05/30/2022      Past Surgical History:   Procedure Laterality Date    HX LOBECTOMY      LUNG CANCER SURGERY            nitroGLYCERIN (NITRO-BID) 2 % topical ointment, 0.5 Inch, Apply Topically, Now         Allergies   Allergen Reactions    Ciprofloxacin Hives/ Urticaria     Family Medical History:    None         Social History  Tobacco Use    Smoking status: Former     Types: Cigarettes    Smokeless tobacco: Never   Substance Use Topics    Alcohol use: Never       ROS: All other ROS are negative except for what is noted in HPI     I/O last 24 hours:    Intake/Output Summary (Last 24 hours) at 05/30/2022 0756  Last data filed at 05/30/2022 0303  Gross per 24 hour   Intake 2300 ml   Output --   Net 2300 ml     I/O current shift:  No intake/output data recorded.    DIAGNOSTIC STUDIES:    Results for orders placed or performed during the hospital encounter of 05/29/22   COVID-19, FLU A/B, RSV RAPID BY PCR   Result Value Ref Range    SARS-CoV-2 Not Detected Not Detected    INFLUENZA VIRUS TYPE A Not Detected Not Detected    INFLUENZA VIRUS TYPE B Not Detected Not Detected    RESPIRATORY SYNCTIAL VIRUS (RSV) Not Detected Not Detected   COMPREHENSIVE METABOLIC PANEL, NON-FASTING   Result  Value Ref Range    SODIUM 140 136 - 145 mmol/L    POTASSIUM 5.0 3.5 - 5.1 mmol/L    CHLORIDE 103 98 - 107 mmol/L    CO2 TOTAL 31 21 - 32 mmol/L    ANION GAP 6 4 - 13 mmol/L    BUN 20 (H) 7 - 18 mg/dL    CREATININE 1.22 (H) 0.55 - 1.02 mg/dL    BUN/CREA RATIO 16     ESTIMATED GFR 45 (L) >59 mL/min/1.74m^2    ALBUMIN 3.5 3.4 - 5.0 g/dL    CALCIUM 10.0 8.5 - 10.1 mg/dL    GLUCOSE 136 (H) 74 - 106 mg/dL    ALKALINE PHOSPHATASE 121 (H) 46 - 116 U/L    ALT (SGPT) 19 <=78 U/L    AST (SGOT) 17 15 - 37 U/L    BILIRUBIN TOTAL 0.4 0.2 - 1.0 mg/dL    PROTEIN TOTAL 6.5 6.4 - 8.2 g/dL    ALBUMIN/GLOBULIN RATIO 1.2 0.8 - 1.4    OSMOLALITY, CALCULATED 284 270 - 290 mOsm/kg    CALCIUM, CORRECTED 10.4 mg/dL    GLOBULIN 3.0    TROPONIN NOW   Result Value Ref Range    TROPONIN I 3 <15 ng/L   TROPONIN IN ONE HOUR   Result Value Ref Range    TROPONIN I 3 <15 ng/L   D-DIMER   Result Value Ref Range    D-DIMER 1,167 (HH) <=232 ng/mL DDU   MAGNESIUM   Result Value Ref Range    MAGNESIUM 1.2 (L) 1.8 - 2.4 mg/dL   PT/INR   Result Value Ref Range    PROTHROMBIN TIME 13.0 (H) 9.8 - 12.7 seconds    INR 1.12 (H) 0.88 - 1.10   PTT (PARTIAL THROMBOPLASTIN TIME)   Result Value Ref Range    APTT 29.5 22.0 - 31.7 seconds   CBC WITH DIFF   Result Value Ref Range    WBC 5.0 4.0 - 10.5 x10^3/uL    RBC 3.99 (L) 4.20 - 5.40 x10^6/uL    HGB 11.7 (L) 12.5 - 16.0 g/dL    HCT 37.1 37.0 - 47.0 %    MCV 93.1 78.0 - 99.0 fL    MCH 29.4 27.0 - 32.0 pg    MCHC 31.6 (L) 32.0 - 36.0 g/dL    RDW 16.0 (H) 11.6 - 14.8 %  PLATELETS 156 140 - 440 x10^3/uL    MPV 8.7 7.4 - 10.4 fL    NEUTROPHIL % 56 40 - 76 %    LYMPHOCYTE % 34 25 - 45 %    MONOCYTE % 7 0 - 12 %    EOSINOPHIL % 2 0 - 7 %    BASOPHIL % 0 0 - 3 %    NEUTROPHIL # 2.79 1.80 - 8.40 x10^3/uL    LYMPHOCYTE # 1.70 1.10 - 5.00 x10^3/uL    MONOCYTE # 0.37 0.00 - 1.30 x10^3/uL    EOSINOPHIL # 0.09 0.00 - 0.80 x10^3/uL    BASOPHIL # 0.02 0.00 - 0.30 x10^3/uL   URINALYSIS, MACRO/MICRO   Result Value Ref Range     COLOR Yellow Yellow    APPEARANCE Clear Clear    SPECIFIC GRAVITY 1.025 1.003 - 1.035    PH 6.0 4.6 - 8.0    LEUKOCYTES Negative Negative WBCs/uL    NITRITE Negative Negative    PROTEIN Negative Negative mg/dL    GLUCOSE Negative Negative mg/dL    KETONES Negative Negative mg/dL    BILIRUBIN Negative Negative mg/dL    BLOOD Negative Negative mg/dL    UROBILINOGEN 0.2 0.2 - 1.0 mg/dL   TROPONIN IN THREE HOURS   Result Value Ref Range    TROPONIN I 4 <15 ng/L   MAGNESIUM   Result Value Ref Range    MAGNESIUM 1.8 1.8 - 2.4 mg/dL   BASIC METABOLIC PANEL   Result Value Ref Range    SODIUM 141 136 - 145 mmol/L    POTASSIUM 4.3 3.5 - 5.1 mmol/L    CHLORIDE 104 98 - 107 mmol/L    CO2 TOTAL 30 21 - 32 mmol/L    ANION GAP 7 4 - 13 mmol/L    CALCIUM 9.5 8.5 - 10.1 mg/dL    GLUCOSE 94 74 - 106 mg/dL    BUN 15 7 - 18 mg/dL    CREATININE 0.90 0.55 - 1.02 mg/dL    BUN/CREA RATIO 17     ESTIMATED GFR 65 >59 mL/min/1.68m^2    OSMOLALITY, CALCULATED 282 270 - 290 mOsm/kg   TROPONIN-I   Result Value Ref Range    TROPONIN I 7 <15 ng/L   CBC WITH DIFF   Result Value Ref Range    WBC 4.0 4.0 - 10.5 x10^3/uL    RBC 3.78 (L) 4.20 - 5.40 x10^6/uL    HGB 11.2 (L) 12.5 - 16.0 g/dL    HCT 34.9 (L) 37.0 - 47.0 %    MCV 92.4 78.0 - 99.0 fL    MCH 29.7 27.0 - 32.0 pg    MCHC 32.1 32.0 - 36.0 g/dL    RDW 16.1 (H) 11.6 - 14.8 %    PLATELETS 137 (L) 140 - 440 x10^3/uL    MPV 8.5 7.4 - 10.4 fL   MANUAL DIFFERENTIAL   Result Value Ref Range    WBC 4.0 x10^3/uL    NEUTROPHIL % 56 40 - 76 %    LYMPHOCYTE % 38 25 - 45 %    MONOCYTE % 3 0 - 12 %    EOSINOPHIL % 2 0 - 7 %    BASOPHIL % 1 0 - 3 %    METAMYELOCYTE %      MYELOCYTE %      PROMYELOCYTE %      BAND %      BLAST %      OTHER %  NEUTROPHIL ABSOLUTE 2.24 1.80 - 8.40 x10^3/uL    LYMPHOCYTE ABSOLUTE 1.52 1.10 - 5.00 x10^3/uL    MONOCYTE ABSOLUTE 0.12 0.00 - 1.30 x10^3/uL    EOSINOPHIL ABSOLUTE 0.08 0.00 - 0.80 x10^3/uL    BASOPHIL ABSOLUTE 0.04 0.00 - 0.30 x10^3/uL    METAMYELOCYTE ABSOLUTE       MYELOCYTE ABSOLUTE      PROMYELOCYTE ABSOLUTE      BLAST ABSOLUTE      OTHER CELL ABSOLUTE      ANISOCYTOSIS 1+ (10-25%)     POLYCHROMASIA      POIKILOCYTOSIS      BASOPHILIC STIPPLING      MICROCYTOSIS      MACROCYTOSIS      ROULEAUX      SCHISTOCYTES      SPHEROCYTES      TARGET CELLS      TEARDROP CELLS      OVALOCYTE (ELLIPTOCYTE)      CRENATED RED CELLS      STOMATOCYTES      ACANTHOCYTES (SPUR CELL)      ECHINOCYTE (BURR CELL)      BLISTER CELLS      RBC AGGLUTINATES      HOWELL JOLLY BODIES      ATYPICAL LYMPHOCYTES      TOXIC GRANULATION      DOHLE BODIES      TOXIC VACUOLIZATION Present     AUER RODS      BASKET CELLS      HYPERSEGMENTATION      LARGE PLATELETS Present     PLATELET CLUMPS      PLATELET MORPHOLOGY COMMENT      BANDS NEUTROPHILS MANUAL      BAND ABSOLUTE      NEUTROPHILS MANUAL 56     LYMPHOCYTES MANUAL 38     MONOCYTES MANUAL 3     EOSINOPHILS MANUAL 2     BASOPHILS MANUAL 1     PROMYELOCYTES MANUAL      MYELOCYTES MANUAL      METAMYELOCYTES MANUAL      BLASTS MANUAL      TOTAL CELLS COUNTED [#] IN BLOOD 100     OTHER CELLS MANUAL      NUCLEATED RBC MANUAL      PLASMA CELL %      PLASMA CELL ABSOLUE      PLASMA CELLS MANUAL      HYPOCHROMASIA     ECG 12 LEAD   Result Value Ref Range    Ventricular rate 50 BPM    Atrial Rate 50 BPM    PR Interval 124 ms    QRS Duration 90 ms    QT Interval 406 ms    QTC Calculation 370 ms    Calculated P Axis 100 degrees    Calculated R Axis 106 degrees    Calculated T Axis -6 degrees       Cardiopulmonary/Radiology:  CT ANGIO CHEST FOR PULMONARY EMBOLUS W IV CONTRAST    Result Date: 05/29/2022  Impression No pulmonary embolus detected Posttreatment changes in the right chest No acute pleural disease. No acute infiltrates One or more dose reduction techniques were used (e.g., Automated exposure control, adjustment of the mA and/or kV according to patient size, use of iterative reconstruction technique). Radiologist location ID: [id number]     XR CHEST  AP    Result Date: 05/29/2022  [place] Radiologist location ID: [id number]      No results found for  this or any previous visit (from the past 2400 hour(s)).     PHYSICAL EXAMINATION:  BP (!) 153/74   Pulse 63   Temp 36.7 C (98.1 F)   Resp 15   Ht 1.626 m (5\' 4" )   Wt 93 kg (205 lb)   SpO2 97%   BMI 35.19 kg/m     General: No acute distress and appears stated age.    HEENT: Head normocephalic, atraumatic.  Mucouse membranes moist.    Neck: No JVD, no carotid bruit.    Lungs: Scattered wheezing to auscultation bilaterally.    Cardiovascular: Regular rate and rhythm, normal S1-S2 without murmur, gallop, or rub.    Abdomen: Soft, non-tender and bowel sounds normal.    Extremities: Mild, non pitting edema.  Skin: Skin warm and dry.    Neurologic: Alert and oriented x3.  Psych: Mood and affect congruent for age and gender     I participated and evaluated the patient as part of a collaborative telemedicine service.  See Dr. Derry Skill addendum for additional information.  My findings from this visit are as stated above.  The co-signing physician did not participate in the management of this patient unless otherwise noted.      Isabell Jarvis, APRN,FNP-BC 05/30/2022 07:56

## 2022-05-30 NOTE — Care Plan (Signed)
Patient admitted for chest pain. Continuous pulse oximetry and Telemetry monitoring in use. Vitals and Labs monitored per providers orders. Pt scheduled for nuclear stress test today.  Pt educated on plan of care and disease process. Discharge planning initiated on admission and being followed by case management.       Sharyl Nimrod, RN

## 2022-05-30 NOTE — H&P (Addendum)
[facility]    HOSPITALIST H&P    Kelsey Preston 82 y.o. female 202/B   Date of Service: 05/30/2022    Date of Admission:  05/29/2022   PCP: Octaviano Batty, MD Code Status:Full Code       Chief Complaint:  " Chest pain "    HPI:   This is a 82 year old female with a PMH of lung cancer s/p lobectomy > 10 yrs ago (complicated by postop AFib which converted spontaneously), history of endometrial CA s/p hysterectomy, DM2, HTN, HLD and esophageal reflux who was transferred from [facility] ER where she initially presented with sudden onset chest tightness, diaphoresis and shortness of breath.  Episode was at rest.  No relieving or aggravating factors (somewhat improved after she drank some cold water).  Resolved after 10-15 minutes.  No similar episode in the past.  No fevers, cough, nausea or vomiting.    Never smoked   History of DM2   Father died of MI at age 57    She is currently chest pain-free    D-dimer 1,167  Troponin negative x3      EKG:  Sinus bradycardia, T-wave inversion lead III      CTA chest:  Negative for PE.  No acute pleural disease or infiltrates        Addendum:  2:45 p.m.   -notified by by ultrasound dept that patient's Doppler studies was positive for left lower extremity DVT  -pharmacy consulted for IV heparin (we will transition IV heparin to oral anticoagulant as soon as cardiology rules out any intervention for possible ACS)            ED medications:   Medications Administered in the ED   aspirin chewable tablet 324 mg (324 mg Oral Given 05/29/22 2159)   LR bolus infusion 1,000 mL (0 mL Intravenous Stopped 05/29/22 2230)   magnesium sulfate 1 G in D5W 100 mL premix IVPB (0 g Intravenous Stopped 05/29/22 2333)   magnesium sulfate 1 G in D5W 100 mL premix IVPB (1 g Intravenous New Bag/New Syringe 05/29/22 2359)   magnesium sulfate 1 G in D5W 100 mL premix IVPB (has no administration in time range)   LR bolus infusion 1,000 mL (1,000 mL Intravenous New Bag/New Syringe  05/29/22 2303)   iohexol (OMNIPAQUE 350) infusion (100 mL Intravenous Given 05/29/22 2309)         PMHx:    Past Medical History:   Diagnosis Date    Diabetes mellitus, type 2 (CMS HCC)     HTN (hypertension)     Macular degeneration (senile) of retina         PSHx:   Past Surgical History:   Procedure Laterality Date    HX LOBECTOMY      LUNG CANCER SURGERY            Allergies:    Allergies   Allergen Reactions    Ciprofloxacin Hives/ Urticaria    Social History  Social History     Tobacco Use    Smoking status: Former     Types: Cigarettes    Smokeless tobacco: Never   Substance Use Topics    Alcohol use: Never    Drug use: Never       Family History  Family Medical History:    None            Home Meds:      Prior to Admission medications    Medication Sig  Start Date End Date Taking? Authorizing Provider   azithromycin (ZITHROMAX) 250 mg Oral Tablet Take 500 mg (2 tab) on day 1; take 250 mg (1 tab) on days 2-5.  Patient not taking: Reported on 05/30/2022 06/29/21   Darryll Capers, DO   carvediloL (COREG) 25 mg Oral Tablet Take 1 Tablet (25 mg total) by mouth Twice daily with food   Yes Provider, Historical   esomeprazole magnesium (NEXIUM) 20 mg Oral Capsule, Delayed Release(E.C.) Take 1 Capsule (20 mg total) by mouth Every morning before breakfast   Yes Provider, Historical   ezetimibe (ZETIA) 10 mg Oral Tablet Take 1 Tablet (10 mg total) by mouth Every evening   Yes Provider, Historical   famotidine (PEPCID) 40 mg Oral Tablet Take 1 Tablet (40 mg total) by mouth Twice daily   Yes Provider, Historical   metFORMIN (GLUCOPHAGE) 500 mg Oral Tablet Take 1 Tablet (500 mg total) by mouth Twice daily with food   Yes Provider, Historical   sertraline (ZOLOFT) 50 mg Oral Tablet Take 1 Tablet (50 mg total) by mouth Once a day   Yes Provider, Historical   telmisartan (MICARDIS) 80 mg Oral Tablet Take 1 Tablet (80 mg total) by mouth Once a day   Yes Provider, Historical          ROS:   General: No fever or chills. No weight  changes, fatigue, weakness.   HEENT: No headaches, dizziness, changes in vision, changes in hearing, or difficulty swallowing.    Skin:  No rashes, erythema or bruises.   Cardiac: No chest pain, palpitations, or arrhythmia.    Respiratory: No shortness of breath, cough, or wheezing.  GI: No nausea or vomiting. No abdominal pain.   Urinary: No dysuria, hematuria, or change in frequency.    Vascular: No edema.     Musculoskeletal: No muscle weakness, pain, or decreased range of motion.   Neurologic: No loss of sensation, numbness or tingling.   Endocrine: No heat or cold intolerance or polydipsia.   Psychiatric: No insomnia, depression or anxiety.      Results for orders placed or performed during the hospital encounter of 05/29/22 (from the past 24 hour(s))   ECG 12 LEAD   Result Value Ref Range    Ventricular rate 50 BPM    Atrial Rate 50 BPM    PR Interval 124 ms    QRS Duration 90 ms    QT Interval 406 ms    QTC Calculation 370 ms    Calculated P Axis 100 degrees    Calculated R Axis 106 degrees    Calculated T Axis -6 degrees   COMPREHENSIVE METABOLIC PANEL, NON-FASTING   Result Value Ref Range    SODIUM 140 136 - 145 mmol/L    POTASSIUM 5.0 3.5 - 5.1 mmol/L    CHLORIDE 103 98 - 107 mmol/L    CO2 TOTAL 31 21 - 32 mmol/L    ANION GAP 6 4 - 13 mmol/L    BUN 20 (H) 7 - 18 mg/dL    CREATININE 1.22 (H) 0.55 - 1.02 mg/dL    BUN/CREA RATIO 16     ESTIMATED GFR 45 (L) >59 mL/min/1.8m^2    ALBUMIN 3.5 3.4 - 5.0 g/dL    CALCIUM 10.0 8.5 - 10.1 mg/dL    GLUCOSE 136 (H) 74 - 106 mg/dL    ALKALINE PHOSPHATASE 121 (H) 46 - 116 U/L    ALT (SGPT) 19 <=78 U/L    AST (SGOT) 17 15 -  37 U/L    BILIRUBIN TOTAL 0.4 0.2 - 1.0 mg/dL    PROTEIN TOTAL 6.5 6.4 - 8.2 g/dL    ALBUMIN/GLOBULIN RATIO 1.2 0.8 - 1.4    OSMOLALITY, CALCULATED 284 270 - 290 mOsm/kg    CALCIUM, CORRECTED 10.4 mg/dL    GLOBULIN 3.0    TROPONIN NOW   Result Value Ref Range    TROPONIN I 3 <15 ng/L   D-DIMER   Result Value Ref Range    D-DIMER 1,167 (HH) <=232 ng/mL  DDU   MAGNESIUM   Result Value Ref Range    MAGNESIUM 1.2 (L) 1.8 - 2.4 mg/dL   PT/INR   Result Value Ref Range    PROTHROMBIN TIME 13.0 (H) 9.8 - 12.7 seconds    INR 1.12 (H) 0.88 - 1.10   PTT (PARTIAL THROMBOPLASTIN TIME)   Result Value Ref Range    APTT 29.5 22.0 - 31.7 seconds   CBC WITH DIFF   Result Value Ref Range    WBC 5.0 4.0 - 10.5 x10^3/uL    RBC 3.99 (L) 4.20 - 5.40 x10^6/uL    HGB 11.7 (L) 12.5 - 16.0 g/dL    HCT 37.1 37.0 - 47.0 %    MCV 93.1 78.0 - 99.0 fL    MCH 29.4 27.0 - 32.0 pg    MCHC 31.6 (L) 32.0 - 36.0 g/dL    RDW 16.0 (H) 11.6 - 14.8 %    PLATELETS 156 140 - 440 x10^3/uL    MPV 8.7 7.4 - 10.4 fL    NEUTROPHIL % 56 40 - 76 %    LYMPHOCYTE % 34 25 - 45 %    MONOCYTE % 7 0 - 12 %    EOSINOPHIL % 2 0 - 7 %    BASOPHIL % 0 0 - 3 %    NEUTROPHIL # 2.79 1.80 - 8.40 x10^3/uL    LYMPHOCYTE # 1.70 1.10 - 5.00 x10^3/uL    MONOCYTE # 0.37 0.00 - 1.30 x10^3/uL    EOSINOPHIL # 0.09 0.00 - 0.80 x10^3/uL    BASOPHIL # 0.02 0.00 - 0.30 x10^3/uL   COVID-19, FLU A/B, RSV RAPID BY PCR   Result Value Ref Range    SARS-CoV-2 Not Detected Not Detected    INFLUENZA VIRUS TYPE A Not Detected Not Detected    INFLUENZA VIRUS TYPE B Not Detected Not Detected    RESPIRATORY SYNCTIAL VIRUS (RSV) Not Detected Not Detected   TROPONIN IN ONE HOUR   Result Value Ref Range    TROPONIN I 3 <15 ng/L   URINALYSIS, MACRO/MICRO   Result Value Ref Range    COLOR Yellow Yellow    APPEARANCE Clear Clear    SPECIFIC GRAVITY 1.025 1.003 - 1.035    PH 6.0 4.6 - 8.0    LEUKOCYTES Negative Negative WBCs/uL    NITRITE Negative Negative    PROTEIN Negative Negative mg/dL    GLUCOSE Negative Negative mg/dL    KETONES Negative Negative mg/dL    BILIRUBIN Negative Negative mg/dL    BLOOD Negative Negative mg/dL    UROBILINOGEN 0.2 0.2 - 1.0 mg/dL   TROPONIN IN THREE HOURS   Result Value Ref Range    TROPONIN I 4 <15 ng/L   TROPONIN-I   Result Value Ref Range    TROPONIN I 7 <15 ng/L   CBC WITH DIFF   Result Value Ref Range    WBC 4.0 4.0 -  10.5 x10^3/uL    RBC 3.78 (  L) 4.20 - 5.40 x10^6/uL    HGB 11.2 (L) 12.5 - 16.0 g/dL    HCT 34.9 (L) 37.0 - 47.0 %    MCV 92.4 78.0 - 99.0 fL    MCH 29.7 27.0 - 32.0 pg    MCHC 32.1 32.0 - 36.0 g/dL    RDW 16.1 (H) 11.6 - 14.8 %    PLATELETS 137 (L) 140 - 440 x10^3/uL    MPV 8.5 7.4 - 10.4 fL   MANUAL DIFFERENTIAL   Result Value Ref Range    WBC 4.0 x10^3/uL    NEUTROPHIL % 56 40 - 76 %    LYMPHOCYTE % 38 25 - 45 %    MONOCYTE % 3 0 - 12 %    EOSINOPHIL % 2 0 - 7 %    BASOPHIL % 1 0 - 3 %    METAMYELOCYTE %      MYELOCYTE %      PROMYELOCYTE %      BAND %      BLAST %      OTHER %      NEUTROPHIL ABSOLUTE 2.24 1.80 - 8.40 x10^3/uL    LYMPHOCYTE ABSOLUTE 1.52 1.10 - 5.00 x10^3/uL    MONOCYTE ABSOLUTE 0.12 0.00 - 1.30 x10^3/uL    EOSINOPHIL ABSOLUTE 0.08 0.00 - 0.80 x10^3/uL    BASOPHIL ABSOLUTE 0.04 0.00 - 0.30 x10^3/uL    METAMYELOCYTE ABSOLUTE      MYELOCYTE ABSOLUTE      PROMYELOCYTE ABSOLUTE      BLAST ABSOLUTE      OTHER CELL ABSOLUTE      ANISOCYTOSIS 1+ (10-25%)     POLYCHROMASIA      POIKILOCYTOSIS      BASOPHILIC STIPPLING      MICROCYTOSIS      MACROCYTOSIS      ROULEAUX      SCHISTOCYTES      SPHEROCYTES      TARGET CELLS      TEARDROP CELLS      OVALOCYTE (ELLIPTOCYTE)      CRENATED RED CELLS      STOMATOCYTES      ACANTHOCYTES (SPUR CELL)      ECHINOCYTE (BURR CELL)      BLISTER CELLS      RBC AGGLUTINATES      HOWELL JOLLY BODIES      ATYPICAL LYMPHOCYTES      TOXIC GRANULATION      DOHLE BODIES      TOXIC VACUOLIZATION Present     AUER RODS      BASKET CELLS      HYPERSEGMENTATION      LARGE PLATELETS Present     PLATELET CLUMPS      PLATELET MORPHOLOGY COMMENT      BANDS NEUTROPHILS MANUAL      BAND ABSOLUTE      NEUTROPHILS MANUAL 56     LYMPHOCYTES MANUAL 38     MONOCYTES MANUAL 3     EOSINOPHILS MANUAL 2     BASOPHILS MANUAL 1     PROMYELOCYTES MANUAL      MYELOCYTES MANUAL      METAMYELOCYTES MANUAL      BLASTS MANUAL      TOTAL CELLS COUNTED [#] IN BLOOD 100     OTHER CELLS MANUAL      NUCLEATED  RBC MANUAL      PLASMA CELL %      PLASMA CELL ABSOLUE      PLASMA  CELLS MANUAL      HYPOCHROMASIA     MAGNESIUM   Result Value Ref Range    MAGNESIUM 1.8 1.8 - 2.4 mg/dL   BASIC METABOLIC PANEL   Result Value Ref Range    SODIUM 141 136 - 145 mmol/L    POTASSIUM 4.3 3.5 - 5.1 mmol/L    CHLORIDE 104 98 - 107 mmol/L    CO2 TOTAL 30 21 - 32 mmol/L    ANION GAP 7 4 - 13 mmol/L    CALCIUM 9.5 8.5 - 10.1 mg/dL    GLUCOSE 94 74 - 106 mg/dL    BUN 15 7 - 18 mg/dL    CREATININE 0.90 0.55 - 1.02 mg/dL    BUN/CREA RATIO 17     ESTIMATED GFR 65 >59 mL/min/1.5m^2    OSMOLALITY, CALCULATED 282 270 - 290 mOsm/kg   LIPID PANEL   Result Value Ref Range    CHOLESTEROL 187 <200 mg/dL    TRIGLYCERIDES 60 <=150 mg/dL    HDL CHOL 51 23 - 92 mg/dL    LDL CALC 124 (H) 0 - 100 mg/dL    VLDL CALC 12 0 - 50 mg/dL    CHOL/HDL RATIO 3.7    HGA1C (HEMOGLOBIN A1C WITH EST AVG GLUCOSE)   Result Value Ref Range    HEMOGLOBIN A1C 6.0 4.0 - 6.0 %          Physical:  Filed Vitals:    05/30/22 0700 05/30/22 0710 05/30/22 0720 05/30/22 1015   BP: (!) 174/80 (!) 153/74     Pulse: 63  54 61   Resp: 15      Temp: 36.7 C (98.1 F)      SpO2: 97%         General: Patient is alert and oriented to person, place, and time. No acute distress.  Head: Normocephalic and atraumatic.    Eyes: Pupils equally round and react to light  Throat: Moist oral mucosa  Neck: Supple  Heart: Regular rate and rhythm  Lungs: Clear to auscultation bilaterally with no wheezes or rales. No conversational dyspnea.  Abdomen: Soft, nontender, nondistended belly. Bowel sounds are present   Extremities: + unilateral right leg edema, no cyanosis, or clubbing. Grossly moves all extremities.    Skin: Warm and dry without lesions  Neurologic: Cranial nerves II through XII are grossly intact. Sensation to light touch is intact. Strength 5/5 in upper extremities and lower extremities bilaterally.    Genitourinary:  No urinary incontinence or Foley catheter   Psychiatric: Judgment and insight  are intact. Mood and affect are appropriate for the situation.       Diagnostic studies:  No results found.       EKG interpretation: NSR, TWI lead III      @PEVF @    Assessments:  Active Hospital Problems   (*Primary Problem)    Diagnosis    *Chest pain    History of lung cancer    Essential hypertension    GERD (gastroesophageal reflux disease)    DM2 (diabetes mellitus, type 2) (CMS HCC)       Chest pain  -currently resolved   -troponin negative x3   -telemetry monitoring   -on ezetimibe.  To start aspirin and atorvastatin   -cardiology consulted.  Echo and nuclear stress test pending       Uncontrolled hypertension  -restart home antihypertensives.  Valsartan and carvedilol  -p.r.n. hydralazine      Elevated D-dimer   -CTA chest  negative for PE   -chronic right lower extremity swelling.  We will obtain lower extremity Doppler studies to rule out DVT   -nonspecific  Addendum:  2:45 p.m.   -notified by by ultrasound dept that patient's Doppler studies was positive for left lower extremity DVT  -pharmacy consulted for IV heparin (we will transition IV heparin to oral anticoagulant as soon as cardiology rules out any intervention for possible ACS)        Comorbidities     Esophageal reflux   -on famotidine and pantoprazole        DM2  -hold oral hypoglycemics  -Lantus and ISS.  To adjust as appropriate              Code status: Full Code  DVT prophylaxis:  Lovenox  Diet: DIET NPO - NOW EXCEPT ALL MEDS WITH SIPS OF WATER          Phoebe Perch, MD    [facility]OSPITALIST      This note was partially created using voice recognition software and is inherently subject to errors including those of syntax and "sound alike " substitutions which may escape proof reading. In such instances, original meaning may be extrapolated by contextual derivation.

## 2022-05-31 ENCOUNTER — Other Ambulatory Visit: Payer: Self-pay

## 2022-05-31 ENCOUNTER — Inpatient Hospital Stay (HOSPITAL_COMMUNITY): Payer: Medicare Other

## 2022-05-31 DIAGNOSIS — I82409 Acute embolism and thrombosis of unspecified deep veins of unspecified lower extremity: Secondary | ICD-10-CM

## 2022-05-31 DIAGNOSIS — R079 Chest pain, unspecified: Secondary | ICD-10-CM

## 2022-05-31 DIAGNOSIS — R001 Bradycardia, unspecified: Secondary | ICD-10-CM

## 2022-05-31 LAB — BASIC METABOLIC PANEL
ANION GAP: 4 mmol/L (ref 4–13)
BUN/CREA RATIO: 12 (ref 6–22)
BUN: 12 mg/dL (ref 7–25)
CALCIUM: 10.3 mg/dL (ref 8.6–10.3)
CHLORIDE: 104 mmol/L (ref 98–107)
CO2 TOTAL: 29 mmol/L (ref 21–31)
CREATININE: 1 mg/dL (ref 0.60–1.30)
ESTIMATED GFR: 57 mL/min/{1.73_m2} — ABNORMAL LOW (ref >59)
GLUCOSE: 141 mg/dL — ABNORMAL HIGH (ref 74–109)
OSMOLALITY, CALCULATED: 276 mOsm/kg (ref 270–290)
POTASSIUM: 4.6 mmol/L (ref 3.5–5.1)
SODIUM: 137 mmol/L (ref 136–145)

## 2022-05-31 LAB — LIGHT GREEN TOP TUBE

## 2022-05-31 LAB — POC BLOOD GLUCOSE (RESULTS)
GLUCOSE, POC: 100 mg/dl (ref 70–100)
GLUCOSE, POC: 205 mg/dl — ABNORMAL HIGH (ref 70–100)
GLUCOSE, POC: 115 mg/dl — ABNORMAL HIGH (ref 70–100)
GLUCOSE, POC: 173 mg/dl — ABNORMAL HIGH (ref 70–100)

## 2022-05-31 LAB — LAVENDER TOP TUBE

## 2022-05-31 LAB — CBC WITH DIFF
BASOPHIL #: 0 10*3/uL (ref 0.00–0.10)
BASOPHIL %: 1 % (ref 0–1)
EOSINOPHIL #: 0.1 10*3/uL (ref 0.00–0.50)
EOSINOPHIL %: 3 %
HCT: 34.9 % (ref 31.2–41.9)
HGB: 11.8 g/dL (ref 10.9–14.3)
LYMPHOCYTE #: 1.3 10*3/uL (ref 1.00–3.00)
LYMPHOCYTE %: 38 % (ref 16–44)
MCH: 29.8 pg (ref 24.7–32.8)
MCHC: 33.8 g/dL (ref 32.3–35.6)
MCV: 88.4 fL (ref 75.5–95.3)
MONOCYTE #: 0.3 10*3/uL (ref 0.30–1.00)
MONOCYTE %: 9 % (ref 5–13)
MPV: 10.2 fL (ref 7.9–10.8)
NEUTROPHIL #: 1.7 10*3/uL — ABNORMAL LOW (ref 1.85–7.80)
NEUTROPHIL %: 49 % (ref 43–77)
PLATELETS: 133 10*3/uL — ABNORMAL LOW (ref 140–440)
RBC: 3.95 10*6/uL (ref 3.63–4.92)
RDW: 13.7 % (ref 12.3–17.7)
WBC: 3.5 10*3/uL — ABNORMAL LOW (ref 3.8–11.8)

## 2022-05-31 LAB — PTT (PARTIAL THROMBOPLASTIN TIME)
APTT: 114.7 seconds (ref 25.0–38.0)
APTT: 174.4 seconds (ref 25.0–38.0)
APTT: 57.1 seconds — ABNORMAL HIGH (ref 25.0–38.0)
APTT: 67.3 seconds — ABNORMAL HIGH (ref 25.0–38.0)

## 2022-05-31 LAB — PHOSPHORUS: PHOSPHORUS: 3.1 mg/dL — ABNORMAL LOW (ref 3.7–7.2)

## 2022-05-31 LAB — MAGNESIUM: MAGNESIUM: 1.6 mg/dL — ABNORMAL LOW (ref 1.9–2.7)

## 2022-05-31 MED ORDER — RIVAROXABAN 15 MG TABLET
15.0000 mg | ORAL_TABLET | Freq: Two times a day (BID) | ORAL | Status: DC
Start: 2022-05-31 — End: 2022-06-01
  Administered 2022-05-31 – 2022-06-01 (×2): 15 mg via ORAL
  Filled 2022-05-31 (×2): qty 1

## 2022-05-31 MED ORDER — RIVAROXABAN 10 MG TABLET
20.0000 mg | ORAL_TABLET | Freq: Every evening | ORAL | Status: DC
Start: 2022-06-21 — End: 2022-06-01

## 2022-05-31 MED ORDER — PANTOPRAZOLE 40 MG TABLET,DELAYED RELEASE
40.0000 mg | DELAYED_RELEASE_TABLET | Freq: Two times a day (BID) | ORAL | Status: DC
Start: 2022-05-31 — End: 2022-06-01
  Administered 2022-05-31 – 2022-06-01 (×2): 40 mg via ORAL
  Filled 2022-05-31 (×2): qty 1

## 2022-05-31 MED ORDER — SODIUM PHOSPHATE 3 MMOL/ML INTRAVENOUS SOLUTION
30.0000 mmol | Freq: Once | INTRAVENOUS | Status: AC
Start: 2022-05-31 — End: 2022-05-31
  Administered 2022-05-31: 0 mmol via INTRAVENOUS
  Administered 2022-05-31: 30 mmol via INTRAVENOUS
  Filled 2022-05-31: qty 10

## 2022-05-31 MED ORDER — MAGNESIUM SULFATE 1 GRAM/100 ML IN DEXTROSE 5 % INTRAVENOUS PIGGYBACK
1.0000 g | INJECTION | INTRAVENOUS | Status: AC
Start: 2022-05-31 — End: 2022-05-31
  Administered 2022-05-31 (×3): 0 g via INTRAVENOUS
  Administered 2022-05-31 (×3): 1 g via INTRAVENOUS
  Filled 2022-05-31 (×3): qty 100

## 2022-05-31 NOTE — PT Evaluation (Signed)
[facility]  [place], [address]  [phone]  [phone]  Rehabilitation Services  Physical Therapy     Patient Name: Kelsey Preston  Date of Birth: 1940-04-06  Height: Height: 162.6 cm (5\' 4" )  Weight: Weight: 93 kg (205 lb)  Room/Bed: 202/B  Payor: MEDICARE / Plan: MEDICARE PART A AND B / Product Type: Medicare /     PATIENT DID NOT PARTICIPATE IN THERAPY TODAY DUE TO: ON HOLD PER NURSING Due to L LE DVT.         Hanley Hays, PT 05/31/2022,16:14

## 2022-05-31 NOTE — Progress Notes (Signed)
[facility]   Progress Note    Kelsey Preston  Date of service: 05/31/2022  Date of Admission:  05/29/2022  Hospital Day:  LOS: 1 day     Subjective:  No overnight events reported.  Patient denies any chest pain, palpitation, or shortness on breath overnight.  She was continues to tolerate p.o. medications/nutrition reliably and without issue.  The patient's case was briefly discussed with the cardiology team earlier today.    Out of bed to chair encouraged.    Review of Systems:    As mentioned above.     Objective:      Vital Signs:  Vitals:    05/31/22 0845 05/31/22 1044 05/31/22 1139 05/31/22 1200   BP:   (!) 142/61    Pulse:  68 64    Resp:   18    Temp:   36.5 C (97.7 F)    SpO2: 95%  97% 97%   Weight:       Height:       BMI:                I/O:  I/O last 24 hours:    Intake/Output Summary (Last 24 hours) at 05/31/2022 1334  Last data filed at 05/30/2022 1928  Gross per 24 hour   Intake 240 ml   Output --   Net 240 ml         acetaminophen (TYLENOL) tablet, 650 mg, Oral, Q4H PRN  aspirin chewable tablet 81 mg, 81 mg, Oral, Daily  atorvastatin (LIPITOR) tablet, 40 mg, Oral, QPM  [Held by provider] carvedilol (COREG) tablet, 25 mg, Oral, 2x/day-Food  Correction/SSIP insulin lispro (HumaLOG) 100 units/mL injection, 0-18 Units, Subcutaneous, 4x/day AC  dextrose (GLUTOSE) 40% oral gel, 15 g, Oral, Q15 Min PRN  dextrose 50% (0.5 g/mL) injection - syringe, 12.5 g, Intravenous, Q15 Min PRN  ezetimibe (ZETIA) tablet, 10 mg, Oral, QPM  famotidine (PEPCID) tablet, 40 mg, Oral, 2x/day  glucagon (GLUCAGEN DIAGNOSTIC KIT) injection 1 mg, 1 mg, IntraMUSCULAR, Once PRN  heparin 25,000 units in D5W 250 mL infusion, 18 Units/kg/hr (Adjusted), Intravenous, Continuous  hydrALAZINE (APRESOLINE) injection 10 mg, 10 mg, Intravenous, Q6H PRN  ipratropium-albuterol 0.5 mg-3 mg(2.5 mg base)/3 mL Solution for Nebulization, 3 mL, Nebulization, Q4H PRN  nitroGLYCERIN (NITROSTAT) sublingual tablet, 0.4 mg, Sublingual, Q5  Min PRN  pantoprazole (PROTONIX) delayed release tablet, 40 mg, Oral, Daily  sertraline (ZOLOFT) tablet, 50 mg, Oral, Daily  valsartan (DIOVAN) tablet, 160 mg, Oral, Daily        Physical Exam:    Gen: NAD, pleasant, alert/oriented x 3, speaking in full sentences  HEENT:  Eyeglasses in place, moist mucous membranes  Neck: supple, no TM  Heart:  Normal S1/S2, no extra heart sounds noted  Chest:  Clear to auscultation bilaterally, good aeration to bases  Abdomen:+BS, soft,NT/ND, no rebound/guarding, normal contour  Extr: no c/c/e  Neurological:CN II-XII grossly normal, no focal deficits noted  Skin: Skin is warm and dry, no erythema noted    Labs:  Results for orders placed or performed during the hospital encounter of 05/29/22 (from the past 24 hour(s))   CBC   Result Value Ref Range    WBC 3.5 (L) 3.8 - 11.8 x10^3/uL    RBC 4.24 3.63 - 4.92 x10^6/uL    HGB 12.8 10.9 - 14.3 g/dL    HCT 38.5 31.2 - 41.9 %    MCV 90.8 75.5 - 95.3 fL    MCH 30.3 24.7 -  32.8 pg    MCHC 33.3 32.3 - 35.6 g/dL    RDW 14.1 12.3 - 17.7 %    PLATELETS 112 (L) 140 - 440 x10^3/uL    MPV 9.4 7.9 - 10.8 fL    Narrative    [id number] ON [id number] IN 9999/9   PTT (PARTIAL THROMBOPLASTIN TIME)   Result Value Ref Range    APTT 35.6 25.0 - 38.0 seconds   PTT (PARTIAL THROMBOPLASTIN TIME)   Result Value Ref Range    APTT 174.4 (HH) 25.0 - 38.0 seconds   PTT (PARTIAL THROMBOPLASTIN TIME)   Result Value Ref Range    APTT 114.7 (HH) 25.0 - 38.0 seconds   CBC/DIFF    Narrative    The following orders were created for panel order CBC/DIFF.  Procedure                               Abnormality         Status                     ---------                               -----------         ------                     CBC WITH [id number]                Abnormal            Final result                 Please view results for these tests on the individual orders.   BASIC METABOLIC PANEL, NON-FASTING   Result Value Ref Range    SODIUM 137 136 - 145 mmol/L     POTASSIUM 4.6 3.5 - 5.1 mmol/L    CHLORIDE 104 98 - 107 mmol/L    CO2 TOTAL 29 21 - 31 mmol/L    ANION GAP 4 4 - 13 mmol/L    CALCIUM 10.3 8.6 - 10.3 mg/dL    GLUCOSE 141 (H) 74 - 109 mg/dL    BUN 12 7 - 25 mg/dL    CREATININE 1.00 0.60 - 1.30 mg/dL    BUN/CREA RATIO 12 6 - 22    ESTIMATED GFR 57 (L) >59 mL/min/1.56m^2    OSMOLALITY, CALCULATED 276 270 - 290 mOsm/kg    Narrative    Estimated Glomerular Filtration Rate (eGFR) is calculated using the CKD-EPI (2021) equation, intended for patients 65 years of age and older. If gender is not documented or "unknown", there will be no eGFR calculation.     MAGNESIUM   Result Value Ref Range    MAGNESIUM 1.6 (L) 1.9 - 2.7 mg/dL   PHOSPHORUS   Result Value Ref Range    PHOSPHORUS 3.1 (L) 3.7 - 7.2 mg/dL   CBC WITH DIFF   Result Value Ref Range    WBC 3.5 (L) 3.8 - 11.8 x10^3/uL    RBC 3.95 3.63 - 4.92 x10^6/uL    HGB 11.8 10.9 - 14.3 g/dL    HCT 34.9 31.2 - 41.9 %    MCV 88.4 75.5 - 95.3 fL    MCH 29.8 24.7 - 32.8 pg    MCHC 33.8 32.3 - 35.6 g/dL  RDW 13.7 12.3 - 17.7 %    PLATELETS 133 (L) 140 - 440 x10^3/uL    MPV 10.2 7.9 - 10.8 fL    NEUTROPHIL % 49 43 - 77 %    LYMPHOCYTE % 38 16 - 44 %    MONOCYTE % 9 5 - 13 %    EOSINOPHIL % 3 %    BASOPHIL % 1 0 - 1 %    NEUTROPHIL # 1.70 (L) 1.85 - 7.80 x10^3/uL    LYMPHOCYTE # 1.30 1.00 - 3.00 x10^3/uL    MONOCYTE # 0.30 0.30 - 1.00 x10^3/uL    EOSINOPHIL # 0.10 0.00 - 0.50 x10^3/uL    BASOPHIL # 0.00 0.00 - 0.10 x10^3/uL   PTT (PARTIAL THROMBOPLASTIN TIME)   Result Value Ref Range    APTT 57.1 (H) 25.0 - 38.0 seconds   PTT (PARTIAL THROMBOPLASTIN TIME)   Result Value Ref Range    APTT 67.3 (H) 25.0 - 38.0 seconds   EXTRA TUBES    Narrative    The following orders were created for panel order EXTRA TUBES.  Procedure                               Abnormality         Status                     ---------                               -----------         ------                     LIGHT GREEN TOP [id number]                              Final result               LAVENDER TOP [id number]                                Final result                 Please view results for these tests on the individual orders.   LIGHT GREEN TOP TUBE   Result Value Ref Range    RAINBOW/EXTRA TUBE AUTO RESULT Yes    LAVENDER TOP TUBE   Result Value Ref Range    RAINBOW/EXTRA TUBE AUTO RESULT Yes    POC BLOOD GLUCOSE (RESULTS)   Result Value Ref Range    GLUCOSE, POC 132 (H) 70 - 100 mg/dl   POC BLOOD GLUCOSE (RESULTS)   Result Value Ref Range    GLUCOSE, POC 140 (H) 70 - 100 mg/dl   POC BLOOD GLUCOSE (RESULTS)   Result Value Ref Range    GLUCOSE, POC 100 70 - 100 mg/dl   POC BLOOD GLUCOSE (RESULTS)   Result Value Ref Range    GLUCOSE, POC 205 (H) 70 - 100 mg/dl      Microbiology:  No results found for any visits on 05/29/22 (from the past 96 hour(s)).        Assessment/ Plan:   Chest Pain, POA   -ruled out for ACS with CE's  -telemetry monitoring to  continue  -HbA1c 6.0, lipid panel reviewed, TSH pending  -ASA, statin Rx to continue  -bradycardia with beta-blocker Rx noted  -2D echo results pending  -outpatient cardiology follow-up planned  -monitor closely    Essential Hypertension  -SBP 140's; HR 60s  -continue current regimen  -monitor and titrate as clinically appropriate    Acute DVT, POA  -confirmed a lower extremity Doppler  -full anticoagulation Rx to continue  -no active issues    Deconditioned State  -PT as per schedule  -out of bed to chair encouraged     Code Status:  Full code  Diet:  Cardiac  DVT Prophylaxis   -SCDs/Full Anticoagulation    Disposition:  Home discharge planned    I have spent 55 minutes care for this patient with over half in discussion of the diagnosis and the importance of compliance with the treatment plan.  The remainder of the time was spent examining the patient and discussing the plan of care patient's family/care team    Kandis Fantasia, MD    This note was partially generated using MModal Fluency Direct system, and  there may be some incorrect words, spellings, and punctuation that were not noted in checking the note before saving.

## 2022-06-01 DIAGNOSIS — I82412 Acute embolism and thrombosis of left femoral vein: Secondary | ICD-10-CM

## 2022-06-01 DIAGNOSIS — Z85118 Personal history of other malignant neoplasm of bronchus and lung: Secondary | ICD-10-CM

## 2022-06-01 DIAGNOSIS — Z881 Allergy status to other antibiotic agents status: Secondary | ICD-10-CM

## 2022-06-01 LAB — CBC
HCT: 35.2 % (ref 31.2–41.9)
HGB: 11.9 g/dL (ref 10.9–14.3)
MCH: 30.1 pg (ref 24.7–32.8)
MCHC: 33.8 g/dL (ref 32.3–35.6)
MCV: 89.1 fL (ref 75.5–95.3)
MPV: 8.7 fL (ref 7.9–10.8)
PLATELETS: 137 10*3/uL — ABNORMAL LOW (ref 140–440)
RBC: 3.96 10*6/uL (ref 3.63–4.92)
RDW: 13.8 % (ref 12.3–17.7)
WBC: 3.7 10*3/uL — ABNORMAL LOW (ref 3.8–11.8)

## 2022-06-01 LAB — COMPREHENSIVE METABOLIC PANEL, NON-FASTING
ALBUMIN/GLOBULIN RATIO: 1.3 (ref 0.8–1.4)
ALBUMIN: 3.4 g/dL — ABNORMAL LOW (ref 3.5–5.7)
ALKALINE PHOSPHATASE: 95 U/L (ref 34–104)
ALT (SGPT): 12 U/L (ref 7–52)
ANION GAP: 5 mmol/L (ref 4–13)
AST (SGOT): 15 U/L (ref 13–39)
BILIRUBIN TOTAL: 0.7 mg/dL (ref 0.3–1.2)
BUN/CREA RATIO: 13 (ref 6–22)
BUN: 13 mg/dL (ref 7–25)
CALCIUM, CORRECTED: 10.3 mg/dL (ref 8.9–10.8)
CALCIUM: 9.8 mg/dL (ref 8.6–10.3)
CHLORIDE: 107 mmol/L (ref 98–107)
CO2 TOTAL: 28 mmol/L (ref 21–31)
CREATININE: 0.98 mg/dL (ref 0.60–1.30)
ESTIMATED GFR: 59 mL/min/{1.73_m2} — ABNORMAL LOW (ref >59)
GLOBULIN: 2.6 — ABNORMAL LOW (ref 2.9–5.4)
GLUCOSE: 116 mg/dL — ABNORMAL HIGH (ref 74–109)
OSMOLALITY, CALCULATED: 281 mOsm/kg (ref 270–290)
POTASSIUM: 3.8 mmol/L (ref 3.5–5.1)
PROTEIN TOTAL: 6 g/dL — ABNORMAL LOW (ref 6.4–8.9)
SODIUM: 140 mmol/L (ref 136–145)

## 2022-06-01 LAB — THYROID STIMULATING HORMONE (SENSITIVE TSH): TSH: 2.016 u[IU]/mL (ref 0.450–5.330)

## 2022-06-01 LAB — POC BLOOD GLUCOSE (RESULTS): GLUCOSE, POC: 107 mg/dl — ABNORMAL HIGH (ref 70–100)

## 2022-06-01 LAB — MAGNESIUM: MAGNESIUM: 1.8 mg/dL — ABNORMAL LOW (ref 1.9–2.7)

## 2022-06-01 LAB — PHOSPHORUS: PHOSPHORUS: 4.5 mg/dL (ref 3.7–7.2)

## 2022-06-01 MED ORDER — ASPIRIN 81 MG CHEWABLE TABLET
81.0000 mg | CHEWABLE_TABLET | Freq: Every day | ORAL | 0 refills | Status: AC
Start: 2022-06-02 — End: 2022-07-26

## 2022-06-01 MED ORDER — RIVAROXABAN 20 MG TABLET
20.0000 mg | ORAL_TABLET | Freq: Every evening | ORAL | 0 refills | Status: DC
Start: 2022-06-01 — End: 2022-07-26

## 2022-06-01 MED ORDER — ATORVASTATIN 40 MG TABLET: 40.0000 mg | ORAL_TABLET | Freq: Every evening | ORAL | 0 refills | Status: AC

## 2022-06-01 MED ORDER — PANTOPRAZOLE 40 MG TABLET,DELAYED RELEASE
40.0000 mg | DELAYED_RELEASE_TABLET | Freq: Two times a day (BID) | ORAL | 0 refills | Status: AC
Start: 2022-06-01 — End: 2022-06-08

## 2022-06-01 NOTE — Nurses Notes (Signed)
Discharge instructions given to pt and daughter and they verbalized their understanding with no further questions. IV to right hand removed with catheter intact. Pressure dressing applied. Tele monitor removed . Pt left via wheelchair via CNA to private vehicle with daughter

## 2022-06-01 NOTE — PT Evaluation (Signed)
[facility]  [place], [address]  [phone]  [phone]  Rehabilitation Services  Physical Therapy Inpatient Initial Evaluation    Patient Name: Kelsey Preston  Date of Birth: Mar 22, 1940  Height: Height: 162.6 cm (5\' 4" )  Weight: Weight: 93 kg (205 lb)  Room/Bed: 202/B  Payor: MEDICARE / Plan: MEDICARE PART A AND B / Product Type: Medicare /       PMH:  Past Medical History:   Diagnosis Date    Diabetes mellitus, type 2 (CMS HCC)     HTN (hypertension)     Macular degeneration (senile) of retina            Assessment:      Pt presenting with chest pain with history of cancer, HTN and HLD. Pt reports being independent without need for AD. Pt demonstrating independence with functional mobility. Ambulated without AD. Pt at her functional baseline, has no acute therapy needs.    Discharge Needs:    Equipment Recommendation: none anticipated          Discharge Disposition: home    JUSTIFICATION OF DISCHARGE RECOMMENDATION   Based on current diagnosis, functional performance prior to admission, and current functional performance, this patient requires continued PT services in home         Plan:     To provide physical therapy services  for duration of evaluation only.    The risks/benefits of therapy have been discussed with the patient/caregiver and he/she is in agreement with the established plan of care.       Subjective & Objective     Past Medical History:   Diagnosis Date    Diabetes mellitus, type 2 (CMS HCC)     HTN (hypertension)     Macular degeneration (senile) of retina             Past Surgical History:   Procedure Laterality Date    HX LOBECTOMY      LUNG CANCER SURGERY                 06/01/22 1005   Rehab Session   Document Type evaluation   Total PT Minutes: 12   Patient Effort excellent   Symptoms Noted During/After Treatment none   General Information   Patient Profile Reviewed yes   Medical Lines Telemetry   Respiratory Status room air    Existing Precautions/Restrictions full code   Mutuality/Individual Preferences   Anxieties, Fears or Concerns none stated   Living Environment   Lives With spouse   Living Arrangements house   Home Assessment: No Problems Identified   Home Accessibility bed and bath on same level   Financial Concerns none   Transportation Available family or friend will provide   Living Environment Comment 2 steps to enter   Functional Level Prior   Ambulation 0 - independent   Transferring 0 - independent   Toileting 0 - independent   Bathing 0 - independent   Dressing 0 - independent   Eating 0 - independent   Communication 0 - understands/communicates without difficulty   Pre Treatment Status   Pre Treatment Patient Status Patient supine in bed;Nurse approved session   Support Present Pre Treatment  None   Communication Pre Treatment  Nurse   Communication Pre Treatment Comment cleared for PT   Cognitive Assessment/Interventions   Behavior/Mood Observations behavior appropriate to situation, WNL/WFL;alert;cooperative   Orientation Status oriented x 4   Attention WNL/WFL  Follows Commands WNL   Vital Signs   Vitals Comment disconnected from monitors   Pain Assessment   Pretreatment Pain Rating 0/10 - no pain   Posttreatment Pain Rating 0/10 - no pain   RLE Assessment   RLE Assessment WFL- Within Functional Limits   LLE Assessment   LLE Assessment WFL- Within Functional Limits   Trunk Assessment   Trunk Assessment WFL-Within Functional Limits   Bed Mobility Assessment/Treatment   Bed Mobility, Assistive Device Head of Bed Elevated   Supine-Sit Independence independent   Sit to Supine, Independence independent   Transfer Assessment/Treatment   Sit-Stand Independence independent   Stand-Sit Independence independent   Gait Assessment/Treatment   Total Distance Ambulated 30   Independence  independent   Assistive Device    (none)   Distance in Feet 30 ft   Balance Skill Training   Sitting Balance: Static normal balance   Sitting,  Dynamic (Balance) normal balance   Sit-to-Stand Balance normal balance   Standing Balance: Static good balance   Standing Balance: Dynamic good balance   Post Treatment Status   Post Treatment Patient Status Patient supine in bed;Call light within reach;Patient safety alarm activated   Support Present Post Treatment  None   Physical Therapy Clinical Impression   Assessment Pt presenting with chest pain with history of cancer, HTN and HLD. Pt reports being independent without need for AD. Pt demonstrating independence with functional mobility. Ambulated without AD. Pt at her functional baseline, has no acute therapy needs.   Predicted Duration of Therapy Intervention (days/wks) evaluation only   Anticipated Equipment Needs at Discharge (PT) none anticipated   Anticipated Discharge Disposition home   Evaluation Complexity Justification   Patient History: Co-morbidity/factors that impact Plan of Care 0 none that impact Plan of Care   Examination Components Range of motion;Strength;Bed mobility;Balance;Transfers;Ambulation   Presentation Stable: Uncomplicated, straight-forward, problem focused   Clinical Decision Making Low complexity   Evaluation Complexity Low complexity   Psychosocial Support   Trust Relationship/Rapport care explained;questions encouraged;questions answered         INTERVENTION MINUTES: EVALUATION 12 minutes    EVALUATION COMPLEXITY : CLINICAL DECISION MAKING OF LOW COMPLEXITY AS INDICATED BY PMH, PHYSICAL THERAPY ASSESSMENT OF MUSCULOSKELETAL AND NEUROLOGICAL SYSTEMS AND ACTIVITY LIMITATIONS. CLINICAL PRESENTATION IS STABLE AND UNCOMPLICATED    Therapist:     Timmey Lamba June Manvir Thorson, PT  06/01/2022, 13:23

## 2022-06-01 NOTE — Discharge Summary (Signed)
[facility]  DISCHARGE SUMMARY    PATIENT NAME:  Kelsey Preston, Kelsey Preston  MRN:  [id number]  DOB:  10-Apr-1940    ENCOUNTER DATE:  05/29/2022  INPATIENT ADMISSION DATE: 05/30/2022  DISCHARGE DATE:  06/01/2022    ATTENDING PHYSICIAN: Kandis Fantasia, MD  SERVICE: [facility] HOSPITALIST 1  PRIMARY CARE PHYSICIAN: Octaviano Batty, MD       No lay caregiver identified.    PRIMARY DISCHARGE DIAGNOSIS: Chest pain  Active Hospital Problems    Diagnosis Date Noted    Principal Problem: Chest pain [R07.9] 05/30/2022    DVT (deep venous thrombosis) (CMS HCC) [I82.409] 05/31/2022    History of lung cancer [Z85.118] 05/30/2022    Essential hypertension [I10] 05/30/2022    GERD (gastroesophageal reflux disease) [K21.9] 05/30/2022    DM2 (diabetes mellitus, type 2) (CMS HCC) [E11.9] 05/30/2022    Acute deep vein thrombosis (DVT) of femoral vein of left lower extremity (CMS [facility]) [I82.412] 05/30/2022      Resolved Hospital Problems   No resolved problems to display.     There are no active non-hospital problems to display for this patient.          Current Discharge Medication List        START taking these medications.        Details   aspirin 81 mg Tablet, Chewable  Start taking on: June 02, 2022   81 mg, Oral, DAILY  Qty: 30 Tablet  Refills: 0     atorvastatin 40 mg Tablet  Commonly known as: LIPITOR   40 mg, Oral, EVERY EVENING  Qty: 30 Tablet  Refills: 0     pantoprazole 40 mg Tablet, Delayed Release (E.C.)  Commonly known as: PROTONIX   40 mg, Oral, 2 TIMES DAILY BEFORE MEALS  Qty: 14 Tablet  Refills: 0     rivaroxaban 20 mg Tablet  Commonly known as: XARELTO   20 mg, Oral, EVERY EVENING WITH DINNER  Qty: 30 Tablet  Refills: 0            CONTINUE these medications - NO CHANGES were made during your visit.        Details   ezetimibe 10 mg Tablet  Commonly known as: ZETIA   10 mg, Oral, EVERY EVENING  Refills: 0     metFORMIN 500 mg Tablet  Commonly known as: GLUCOPHAGE   500 mg, Oral, 2 TIMES DAILY WITH FOOD  Refills: 0      sertraline 50 mg Tablet  Commonly known as: ZOLOFT   50 mg, Oral, DAILY  Refills: 0     telmisartan 80 mg Tablet  Commonly known as: MICARDIS   80 mg, Oral, DAILY  Refills: 0            STOP taking these medications.      azithromycin 250 mg Tablet  Commonly known as: ZITHROMAX     carvediloL 25 mg Tablet  Commonly known as: COREG     esomeprazole magnesium 20 mg Capsule, Delayed Release(E.C.)  Commonly known as: NEXIUM     famotidine 40 mg Tablet  Commonly known as: PEPCID            Discharge med list refreshed?  YES     Allergies   Allergen Reactions    Ciprofloxacin Hives/ Urticaria     HOSPITAL PROCEDURE(S):   No orders of the defined types were placed in this encounter.      [place] COURSE   BRIEF  HPI:  This is a 82 y.o., female admitted for chest pain evaluation    BRIEF HOSPITAL NARRATIVE:   It was elected to admit the patient for further evaluation in the setting of sudden onset chest discomfort and diaphoresis.  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  The Cardiology team considered presumed cardiac catheterization, however, the patient had an ultrasound which revealed the presence of an acute thrombus embolus in the left femoral vein.  The patient was started on full anticoagulation therapy, which she was able to tolerate without issue.  She denied any chest pain throughout the duration of the hospital stay and reported complete resolution of her presenting complaints.  The patient will be discharged home with plans to follow up with her PCP/cardiologist in the near future for continued management of her chronic medical issues.    TRANSITION/POST DISCHARGE CARE/PENDING TESTS/REFERRALS: Nil    CONDITION ON DISCHARGE:  A. Ambulation: Full ambulation  B. Self-care Ability: Complete  C. Cognitive Status Alert and Oriented x 3  D. Code  status at discharge:       LINES/DRAINS/WOUNDS AT DISCHARGE:   Patient Lines/Drains/Airways Status       Active Line / Dialysis Catheter / Dialysis Graft / Drain / Airway / Wound       Name Placement date Placement time Site Days    Peripheral IV Posterior;Right Dorsal Metacarpals  (top of hand) 05/31/22  1537  -- less than 1                    DISCHARGE DISPOSITION:  Home discharge  DISCHARGE INSTRUCTIONS:  Post-Discharge Follow Up Appointments       Follow up with Octaviano Batty, MD in 1 week(s)    Phone: [phone]    Where: [address], [place] [address]    Follow up with Hernandez in 1 week(s)    Phone: [phone]    Where: [address], [place] [address]      Thursday Jun 06, 2022    New Patient Visit with Willow Ora, MD at  2:15 PM      General Surgery, [facility], [place] [address]  [phone]          No discharge procedures on file.       Kandis Fantasia, MD    Copies sent to Care Team         Relationship Specialty Notifications Start End    Octaviano Batty, MD PCP - General GENERAL PRACTICE  07/11/21     Phone: [phone] Fax: [phone]         [place] [address]            Referring providers can utilize [email or web address] to access their referred [facility] patient's information.

## 2022-06-05 ENCOUNTER — Encounter (INDEPENDENT_AMBULATORY_CARE_PROVIDER_SITE_OTHER): Payer: Self-pay | Admitting: Surgery

## 2022-06-06 ENCOUNTER — Ambulatory Visit (INDEPENDENT_AMBULATORY_CARE_PROVIDER_SITE_OTHER): Payer: Medicare Other | Admitting: Surgery

## 2022-06-06 ENCOUNTER — Encounter (INDEPENDENT_AMBULATORY_CARE_PROVIDER_SITE_OTHER): Payer: Self-pay | Admitting: Surgery

## 2022-06-06 ENCOUNTER — Other Ambulatory Visit: Payer: Self-pay

## 2022-06-06 VITALS — BP 127/67 | HR 59 | Temp 98.1°F | Resp 18 | Ht 64.0 in | Wt 207.0 lb

## 2022-06-06 DIAGNOSIS — R2232 Localized swelling, mass and lump, left upper limb: Secondary | ICD-10-CM

## 2022-06-06 DIAGNOSIS — R921 Mammographic calcification found on diagnostic imaging of breast: Secondary | ICD-10-CM

## 2022-06-09 ENCOUNTER — Encounter (INDEPENDENT_AMBULATORY_CARE_PROVIDER_SITE_OTHER): Payer: Self-pay | Admitting: Surgery

## 2022-06-09 NOTE — Progress Notes (Signed)
GENERAL SURGERY, [facility]  [address]  [place] [address]    History and Physical    Name: Kelsey Preston MRN:  [id number]   Date: 06/06/2022 DOB:  14-Mar-1941 (82 y.o.)              Reason for Visit: Breast Problem (Breast abn left [place] Cat 4)    History of Present Illness  Ms. Manne presents today for evaluation management of an abnormality in the left breast seen on both mammogram and ultrasound.  The patient underwent mammogram on 05/10/2022 which showed a 1.9 cm low axillary lymph node on the left which is more prominent in nature from prior x-rays.  Ultrasound subsequently was performed with arms this area and it was determined to BI-RADS category 4 for which biopsy was recommended.    Positive diabetes, positive Xarelto which will be held 48 hours prior to biopsy      Review of the result(s) of each unique test:  Patient underwent diagnostic testing ( as above ) prior to this dates visit.  I have personally reviewed the results and that serves as a component of the medical decision making for this encounter       Review of prior external note(s) from each unique source:  Patients referral to this office including a recent assessment by the referring provider.  This was reviewed by me for this unique office visit for the indication and intent of the referral as well as any pertinent medical or surgical history relevant to the patients independent evaluation by me today.      Patient History  Past Medical History:   Diagnosis Date    Diabetes mellitus, type 2 (CMS HCC)     HTN (hypertension)     Macular degeneration (senile) of retina          Past Surgical History:   Procedure Laterality Date    HX LOBECTOMY      LUNG CANCER SURGERY           Current Outpatient Medications   Medication Sig    aspirin 81 mg Oral Tablet, Chewable Chew 1 Tablet (81 mg total) Once a day for 30 days    atorvastatin (LIPITOR) 40 mg Oral Tablet Take 1 Tablet (40 mg total) by mouth Every evening  for 30 days    ezetimibe (ZETIA) 10 mg Oral Tablet Take 1 Tablet (10 mg total) by mouth Every evening    metFORMIN (GLUCOPHAGE) 500 mg Oral Tablet Take 1 Tablet (500 mg total) by mouth Twice daily with food    rivaroxaban (XARELTO) 20 mg Oral Tablet Take 1 Tablet (20 mg total) by mouth Every evening with dinner for 30 days    sertraline (ZOLOFT) 50 mg Oral Tablet Take 1 Tablet (50 mg total) by mouth Once a day    telmisartan (MICARDIS) 80 mg Oral Tablet Take 1 Tablet (80 mg total) by mouth Once a day     Allergies   Allergen Reactions    Ciprofloxacin Hives/ Urticaria     Family Medical History:    None         Social History     Tobacco Use    Smoking status: Former     Types: Cigarettes    Smokeless tobacco: Never   Substance Use Topics    Alcohol use: Never    Drug use: Never            Physical Examination:  Vitals:  06/06/22 1402   BP: 127/67   Pulse: 59   Resp: 18   Temp: 36.7 C (98.1 F)   SpO2: 95%   Weight: 93.9 kg (207 lb)   Height: 1.626 m (5\' 4" )   BMI: 35.61        General: appropriate for age. in no acute distress.    Vital signs are present above and have been reviewed by me     HEENT: Atraumatic, Normocephalic. PERRLA. EOMI. Nose clear. Throat clear    Lungs: Nonlabored breathing with symmetric expansion. Clear to auscultation bilaterally    Heart:Regular wth respect to rate and rythmn.    Breast examination shows no palpable masses    Abdomen:Soft. Nontender. Nondistended none    Extremities: Grossly normal. No major deformities     Neuro:  Grossly normal motor and sensory function    Psychiatric: Alert and oriented to person, place, and time. affect appropriate      Assessment and Plan  Ultrasound-guided left breast/axillary on lymph node biopsy to be scheduled      Follow Up:  No follow-ups on file.      ICD-10-CM    1. Axillary mass, left  R22.32 US GUIDED BREAST BX LEFT      2. Mammographic calcification found on diagnostic imaging of breast  R92.1 US GUIDED BREAST BX LEFT          Amyiah Gaba B  Mieka Leaton, MD ,MBA,FACS    I appreciate the opportunity to be involved in the care of your patients.  If you have any questions or concerns regarding this encounter, please do not hesitate to contact me at your convenience.      This note may have been partially generated using MModal Fluency Direct system, and there may be some incorrect words, spellings, and punctuation that were not noted in checking the note before saving, though effort was made to avoid such errors.

## 2022-06-11 ENCOUNTER — Other Ambulatory Visit (HOSPITAL_COMMUNITY): Payer: Self-pay

## 2022-06-11 NOTE — Addendum Note (Signed)
Addended by: Ulyess Blossom on: 06/11/2022 03:15 PM     Modules accepted: Orders

## 2022-06-12 ENCOUNTER — Telehealth (HOSPITAL_COMMUNITY): Payer: Self-pay

## 2022-06-12 NOTE — Telephone Encounter (Signed)
TCM visit completed 3/26 with Dr. Garlon Hatchet

## 2022-06-18 ENCOUNTER — Inpatient Hospital Stay
Admission: RE | Admit: 2022-06-18 | Discharge: 2022-06-18 | Disposition: A | Discharge status: Home / Self Care | Payer: Medicare Other | Source: Ambulatory Visit | Attending: Surgery | Admitting: Surgery

## 2022-06-18 ENCOUNTER — Ambulatory Visit (HOSPITAL_COMMUNITY): Payer: Self-pay

## 2022-06-18 ENCOUNTER — Other Ambulatory Visit: Payer: Self-pay

## 2022-06-18 DIAGNOSIS — R921 Mammographic calcification found on diagnostic imaging of breast: Secondary | ICD-10-CM | POA: Insufficient documentation

## 2022-06-18 DIAGNOSIS — R2232 Localized swelling, mass and lump, left upper limb: Secondary | ICD-10-CM

## 2022-06-18 DIAGNOSIS — C493 Malignant neoplasm of connective and soft tissue of thorax: Secondary | ICD-10-CM | POA: Insufficient documentation

## 2022-06-18 MED ORDER — LIDOCAINE HCL 20 MG/ML (2 %) INJECTION SOLUTION
INTRAMUSCULAR | Status: AC
Start: 2022-06-18 — End: 2022-06-18
  Filled 2022-06-18: qty 20

## 2022-06-20 DIAGNOSIS — C3492 Malignant neoplasm of unspecified part of left bronchus or lung: Secondary | ICD-10-CM

## 2022-06-20 LAB — SURGICAL PATHOLOGY SPECIMEN: Rapid On-Site Evaluation: ADEQUATE

## 2022-06-21 ENCOUNTER — Other Ambulatory Visit (INDEPENDENT_AMBULATORY_CARE_PROVIDER_SITE_OTHER): Payer: Self-pay | Admitting: Surgery

## 2022-06-21 DIAGNOSIS — Z85118 Personal history of other malignant neoplasm of bronchus and lung: Secondary | ICD-10-CM

## 2022-06-24 ENCOUNTER — Telehealth (INDEPENDENT_AMBULATORY_CARE_PROVIDER_SITE_OTHER): Payer: Self-pay | Admitting: MEDICAL ONCOLOGY

## 2022-06-24 ENCOUNTER — Other Ambulatory Visit: Payer: Medicare Other | Attending: MEDICAL ONCOLOGY

## 2022-06-24 ENCOUNTER — Other Ambulatory Visit: Payer: Self-pay

## 2022-06-24 ENCOUNTER — Other Ambulatory Visit (INDEPENDENT_AMBULATORY_CARE_PROVIDER_SITE_OTHER): Payer: Self-pay | Admitting: MEDICAL ONCOLOGY

## 2022-06-24 DIAGNOSIS — Z85118 Personal history of other malignant neoplasm of bronchus and lung: Secondary | ICD-10-CM

## 2022-06-24 LAB — COMPREHENSIVE METABOLIC PANEL, NON-FASTING
ALBUMIN/GLOBULIN RATIO: 1.4 (ref 0.8–1.4)
ALBUMIN: 4 g/dL (ref 3.5–5.7)
ALKALINE PHOSPHATASE: 127 U/L — ABNORMAL HIGH (ref 34–104)
ALT (SGPT): 72 U/L — ABNORMAL HIGH (ref 7–52)
ANION GAP: 5 mmol/L (ref 4–13)
AST (SGOT): 69 U/L — ABNORMAL HIGH (ref 13–39)
BILIRUBIN TOTAL: 0.7 mg/dL (ref 0.3–1.2)
BUN/CREA RATIO: 15 (ref 6–22)
BUN: 16 mg/dL (ref 7–25)
CALCIUM, CORRECTED: 10.1 mg/dL (ref 8.9–10.8)
CALCIUM: 10.1 mg/dL (ref 8.6–10.3)
CHLORIDE: 106 mmol/L (ref 98–107)
CO2 TOTAL: 30 mmol/L (ref 21–31)
CREATININE: 1.04 mg/dL (ref 0.60–1.30)
ESTIMATED GFR: 55 mL/min/{1.73_m2} — ABNORMAL LOW (ref >59)
GLOBULIN: 2.9 (ref 2.9–5.4)
GLUCOSE: 104 mg/dL (ref 74–109)
OSMOLALITY, CALCULATED: 283 mOsm/kg (ref 270–290)
POTASSIUM: 4.5 mmol/L (ref 3.5–5.1)
PROTEIN TOTAL: 6.9 g/dL (ref 6.4–8.9)
SODIUM: 141 mmol/L (ref 136–145)

## 2022-06-24 LAB — CBC WITH DIFF
BASOPHIL #: 0 10*3/uL (ref 0.00–0.10)
BASOPHIL %: 1 % (ref 0–1)
EOSINOPHIL #: 0.5 10*3/uL (ref 0.00–0.50)
EOSINOPHIL %: 12 % — ABNORMAL HIGH
HCT: 35.3 % (ref 31.2–41.9)
HGB: 12 g/dL (ref 10.9–14.3)
LYMPHOCYTE #: 1.1 10*3/uL (ref 1.00–3.00)
LYMPHOCYTE %: 25 % (ref 16–44)
MCH: 30.4 pg (ref 24.7–32.8)
MCHC: 33.9 g/dL (ref 32.3–35.6)
MCV: 89.6 fL (ref 75.5–95.3)
MONOCYTE #: 0.3 10*3/uL (ref 0.30–1.00)
MONOCYTE %: 8 % (ref 5–13)
MPV: 9.1 fL (ref 7.9–10.8)
NEUTROPHIL #: 2.2 10*3/uL (ref 1.85–7.80)
NEUTROPHIL %: 54 % (ref 43–77)
PLATELETS: 155 10*3/uL (ref 140–440)
RBC: 3.94 10*6/uL (ref 3.63–4.92)
RDW: 13.7 % (ref 12.3–17.7)
WBC: 4.1 10*3/uL (ref 3.8–11.8)

## 2022-06-24 LAB — CARCINOEMBRYONIC ANTIGEN: CEA: 1.2 ng/mL (ref <=5.0)

## 2022-06-24 NOTE — Telephone Encounter (Signed)
Left message that appt has been scheduled for 06-26-22 at 1:30 with updated labs done two days prior to this appt at [facility].

## 2022-06-26 ENCOUNTER — Encounter (INDEPENDENT_AMBULATORY_CARE_PROVIDER_SITE_OTHER): Payer: Self-pay | Admitting: MEDICAL ONCOLOGY

## 2022-06-26 ENCOUNTER — Other Ambulatory Visit: Payer: Self-pay

## 2022-06-26 ENCOUNTER — Ambulatory Visit: Payer: Medicare Other | Attending: MEDICAL ONCOLOGY | Admitting: MEDICAL ONCOLOGY

## 2022-06-26 DIAGNOSIS — Z85118 Personal history of other malignant neoplasm of bronchus and lung: Secondary | ICD-10-CM

## 2022-06-26 DIAGNOSIS — Z7982 Long term (current) use of aspirin: Secondary | ICD-10-CM | POA: Insufficient documentation

## 2022-06-26 DIAGNOSIS — Z902 Acquired absence of lung [part of]: Secondary | ICD-10-CM | POA: Insufficient documentation

## 2022-06-26 DIAGNOSIS — Z8542 Personal history of malignant neoplasm of other parts of uterus: Secondary | ICD-10-CM | POA: Insufficient documentation

## 2022-06-26 DIAGNOSIS — Z86718 Personal history of other venous thrombosis and embolism: Secondary | ICD-10-CM | POA: Insufficient documentation

## 2022-06-26 DIAGNOSIS — C801 Malignant (primary) neoplasm, unspecified: Secondary | ICD-10-CM | POA: Insufficient documentation

## 2022-06-26 DIAGNOSIS — D472 Monoclonal gammopathy: Secondary | ICD-10-CM | POA: Insufficient documentation

## 2022-06-26 DIAGNOSIS — Z7901 Long term (current) use of anticoagulants: Secondary | ICD-10-CM | POA: Insufficient documentation

## 2022-06-26 DIAGNOSIS — Z90722 Acquired absence of ovaries, bilateral: Secondary | ICD-10-CM | POA: Insufficient documentation

## 2022-06-26 DIAGNOSIS — C773 Secondary and unspecified malignant neoplasm of axilla and upper limb lymph nodes: Secondary | ICD-10-CM | POA: Insufficient documentation

## 2022-06-26 DIAGNOSIS — Z90710 Acquired absence of both cervix and uterus: Secondary | ICD-10-CM | POA: Insufficient documentation

## 2022-06-26 DIAGNOSIS — Z87891 Personal history of nicotine dependence: Secondary | ICD-10-CM | POA: Insufficient documentation

## 2022-06-26 NOTE — Progress Notes (Signed)
Department of Hematology/Oncology    Name: Kelsey HidalgoFreda Funches  ZOX:[id number]  Date of Birth: May 12, 1940  Encounter Date: 06/26/2022  Referring Provider: Fidela Juneauuremdes, Gene B, MD  [address]  [place],  [place] [id number]    [facility] CTS: Dr. Ewing Schlein'Amico.    Diagnosis:   1. 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  2. IgM Lambda MGUS.  3. Endometrial CA s/p TAH+BSO.    11/25/2007 endocervical curettage:  FIGO grade 1 adenocarcinoma.  12/16/2007 TAH+BSO: FIGO grade 1 adenocarcinoma.    HISTORY OF PRESENT ILLNESS  Kelsey HidalgoFreda Bates is a 82 y.o. female     Previously followed by retired Environmental health practitionerhematologist/oncologist, Dr. Dennison NancyJoel Schor.    Ad[facility] in 05/2022 for sudden onset chest discomfort and diaphoresis.  The patient was evaluated by the Cardiology and underwent a myocardial perfusion scan that showed a small moderate-sized reversible basal-mid inferolateral perfusion defect. Cardiology considered cardiac catheterization, however, the patient had an ultrasound which revealed the presence of an acute thrombus embolus in the left femoral vein. The patient was started on full anticoagulation therapy, which she was able to tolerate without issue.      [facility] last seen on 07/26/2021.  She was being followed for a slowly enlarging right middle lobe ground-glass opacity.    She was seen by surgeon, Dr. Donnal Debaruremdes, for workup of left breast abnormality on mammogram and ultrasound.  A bilateral screening mammogram on 05/10/2022 showed a 1.9 cm low axillary lymph node on the left.  An ultrasound confirmed this abnormality.  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    ROS Pertinent review of systems as discussed in HPI.    HISTORY  Past Medical History:   Diagnosis Date    Diabetes mellitus, type 2 (CMS HCC)     HTN (hypertension)     Macular degeneration (senile) of retina       Past Surgical History:   Procedure  Laterality Date    HX LOBECTOMY      LUNG CANCER SURGERY        Social History     Tobacco Use    Smoking status: Former     Types: Cigarettes    Smokeless tobacco: Never   Substance Use Topics    Alcohol use: Never    Drug use: Never      Family Medical History:    None        Current Outpatient Medications   Medication Sig    aspirin 81 mg Oral Tablet, Chewable Chew 1 Tablet (81 mg total) Once a day for 30 days    atorvastatin (LIPITOR) 40 mg Oral Tablet Take 1 Tablet (40 mg total) by mouth Every evening for 30 days    ezetimibe (ZETIA) 10 mg Oral Tablet Take 1 Tablet (10 mg total) by mouth Every evening    metFORMIN (GLUCOPHAGE) 500 mg Oral Tablet Take 1 Tablet (500 mg total) by mouth Twice daily with food    rivaroxaban (XARELTO) 20 mg Oral Tablet Take 1 Tablet (20 mg total) by mouth Every evening with dinner for 30 days    sertraline (ZOLOFT) 50 mg Oral Tablet Take 1 Tablet (50 mg total) by mouth Once a day    telmisartan (MICARDIS) 80 mg Oral Tablet Take 1 Tablet (80 mg total) by mouth Once a day     Allergies   Allergen Reactions    Ciprofloxacin  Hives/ Urticaria     VITALS  There were no vitals filed for this visit.    PHYSICAL EXAM  ECOG Status: (1) Restricted in physically strenuous activity, ambulatory and able to do work of light nature   Physical Exam  Constitutional:       Appearance: Normal appearance.   HENT:      Head: Normocephalic and atraumatic.      Nose: Nose normal.      Mouth/Throat:      Mouth: Mucous membranes are moist.      Pharynx: Oropharynx is clear.   Cardiovascular:      Rate and Rhythm: Normal rate and regular rhythm.      Pulses: Normal pulses.      Heart sounds: Normal heart sounds.   Pulmonary:      Effort: Pulmonary effort is normal.      Breath sounds: Normal breath sounds.   Abdominal:      General: Bowel sounds are normal.      Palpations: Abdomen is soft.   Musculoskeletal:         General: Normal range of motion.      Cervical back: Normal range of motion and neck supple.    Skin:     General: Skin is warm and dry.   Neurological:      General: No focal deficit present.      Mental Status: She is alert and oriented to person, place, and time.   Psychiatric:         Mood and Affect: Mood normal.         Behavior: Behavior normal.       DATA  No results found for this or any previous visit (from the past 540981191 hour(s)).  No results found for this or any previous visit (from the past 47829 hour(s)).  No results found for this or any previous visit (from the past 562130865 hour(s)).   No results found for this or any previous visit (from the past 784696295 hour(s)).     07/25/2020 CT thorax without contrast  1. Stable postsurgical changes following left lower lobectomy  2. Mixed solid and ground-glass nodule in the right middle lobe with slight increase in size of the solid component compared to prior CT dated 10/2018 concerning for slow-growing adenocarcinoma.    Three other scattered pulmonary nodules are unchanged.      05/29/22 CTA Thorax: negative.    PATHOLOGY  Final Diagnosis   Left axillary mass, ultrasound-guided core needle biopsy:     Non-small cell carcinoma.  (See comment).   Electronically signed by Drue Second, MD on 06/20/2022 at [phone]   Diagnosis Comment    Immunoperoxidase stains for pancytokeratin, CK7, CK20, TTF 1, Napsin A, GATA 3, CDX2, PAX8, p63, HMB45, ER and PR are performed with appropriately staining controls.  The tumor cells are positive for pancytokeratin, CK7, GATA 3 and p63 (patchy), and are negative for CK20, TTF 1, Napsin A, CDX2, PAX8, HMB45, ER and PR.     The morphology and staining pattern is not entirely specific for a site of origin of the tumor.  It may represent breast carcinoma, that is ER and PR negative, a urothelial carcinoma and other sites can not be entirely excluded.       LABS  05/31/2020 CBC normal  GFR 51, ALP 131, remainder of CMP normal  SPE+IFE:  0.2 grams/deciliter IgM lambda   IgM 382  Serum FLC: KLC : 41.8 mg/L, K/L  1.48  08/28/2020  PLTs 144K, remainder of CBC normal  GFR 52, ALP 131, remainder of CMP normal  SPE+IFE:  0.2 grams/deciliter IgM lambda   IgM 357  Serum FLC: KLC : 37 mg/L, K/L 1.67    LDH 178  CBC  Diff   Lab Results   Component Value Date/Time    WBC 4.1 06/24/2022 01:23 PM    HGB 12.0 06/24/2022 01:23 PM    HCT 35.3 06/24/2022 01:23 PM    PLTCNT 155 06/24/2022 01:23 PM    RBC 3.94 06/24/2022 01:23 PM    MCV 89.6 06/24/2022 01:23 PM    MCHC 33.9 06/24/2022 01:23 PM    MCH 30.4 06/24/2022 01:23 PM    RDW 13.7 06/24/2022 01:23 PM    MPV 9.1 06/24/2022 01:23 PM    Lab Results   Component Value Date/Time    PMNS 54 06/24/2022 01:23 PM    LYMPHOCYTES 25 06/24/2022 01:23 PM    EOSINOPHIL 12 (H) 06/24/2022 01:23 PM    MONOCYTES 8 06/24/2022 01:23 PM    BASOPHILS 1 06/24/2022 01:23 PM    BASOPHILS 0.00 06/24/2022 01:23 PM    PMNABS 2.20 06/24/2022 01:23 PM    LYMPHSABS 1.10 06/24/2022 01:23 PM    EOSABS 0.50 06/24/2022 01:23 PM    MONOSABS 0.30 06/24/2022 01:23 PM    BASABS 0.04 05/30/2022 05:40 AM        Comprehensive Metabolic Profile    Lab Results   Component Value Date    SODIUM 141 06/24/2022    POTASSIUM 4.5 06/24/2022    CHLORIDE 106 06/24/2022    CO2 30 06/24/2022    ANIONGAP 5 06/24/2022    BUN 16 06/24/2022    CREATININE 1.04 06/24/2022    ALBUMIN 4.0 06/24/2022    CALCIUM 10.1 06/24/2022    GLUCOSENF 104 06/24/2022    ALKPHOS 127 (H) 06/24/2022    ALT 72 (H) 06/24/2022    AST 69 (H) 06/24/2022    TOTBILIRUBIN 0.7 06/24/2022    TOTALPROTEIN 6.9 06/24/2022     ASSESSMENT  1. 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 Did not require adjuvant therapies.    Molecular profile:  Positive: EGFR exon 19.  Negative: KRAS, MET, RET, ALK, ROS1.    Evaluated by PCP initially with CXR showing left lower lobe mass.  A subsequent CT showed a 2.1 x 2.1 x 2.2 cm left lower lobe mass with no associated adenopathy.  Pathology from lobectomy showed a 2.4 cm  adenocarcinoma with focal clear cell features.    2. 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    11/25/2007 endocervical curettage: FIGO grade 1 adenocarcinoma.  12/16/2007 TAH+BSO: FIGO grade 1 adenocarcinoma.    3. 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    She [facility] last seen on 07/26/2021.  She was being followed for a slowly enlarging right middle lobe ground-glass opacity.    She was seen by surgeon, Dr. Donnal Debar, for workup of left breast abnormality on mammogram and ultrasound.  A bilateral screening mammogram on 05/10/2022 showed a 1.9 cm low axillary lymph node on the left.  An ultrasound confirmed this abnormality.  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    A CTA Thorax in 3//2024 was negative. Would be unusual to see axillary metastases of NSCLC w/o  widespread disease elsewhere. This could represent an ER/PR negative breast cancer versus non-small-cell lung carcinoma vs other.  Will order CARISGPS testing for further identification.  Will proceed with staging workup with PET-CT and labs as below.  Further diagnostic workup and treatment recommendations to follow.    4. 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    D/C ASA 81 mg PO daily.    5. IgM Lambda MGUS.    Labs dated 08/20/2019 revealed an M spike of 0.3 g per dL with immunofixation revealing lambda light chain specificity.    PLAN  1. Order CARIS and CARIS GPS testing on 06/18/2022 pathology.  2. Check a PET-CT.  3. D/C ASA.  4. RTC 3 weeks with CBC, CMP.    All relevant medical records were reviewed including available pertinent provider notes, procedure notes, imaging, laboratory, and pathology. All pertinent labs and/or imaging were reviewed with the patient. Sidra Brue was given the chance to ask questions, and these were answered to their satisfaction. The patient is welcome to call with  any questions or concerns in the meantime.     On the day of the encounter, a total of 60 minutes was spent on this patient encounter including review of historical information, examination, documentation and post-visit activities.   Brayton Mars, MD  06/26/2022 , 13:10    CC:  Charm Rings, MD  [address]  [place] [address]    Fidela Juneau, MD  [address]  [place],  [place] [address]    This note was partially generated using MModal Fluency Direct system, and there may be some incorrect words, spellings, and punctuation that were not noted in checking the note before saving.

## 2022-06-27 ENCOUNTER — Ambulatory Visit (INDEPENDENT_AMBULATORY_CARE_PROVIDER_SITE_OTHER): Payer: Self-pay | Admitting: MEDICAL ONCOLOGY

## 2022-06-27 NOTE — Telephone Encounter (Addendum)
Office note faxed to Dr. Wynona Canes per request, called daughter to inform.    Regarding: request for records  ----- Message from Philippa Chester Ward sent at 06/27/2022  3:30 PM EDT -----  Ortencia Kick pt    Pt's daughter is calling in requesting that the doctor's notes from the appt with Dr Reece Agar on 4/10 be sent to the pt's primary care provider. Fax Number: [phone]    Thank you

## 2022-07-04 ENCOUNTER — Other Ambulatory Visit (HOSPITAL_COMMUNITY): Payer: Medicare Other

## 2022-07-04 ENCOUNTER — Inpatient Hospital Stay
Admission: RE | Admit: 2022-07-04 | Discharge: 2022-07-04 | Disposition: A | Discharge status: Home / Self Care | Payer: Medicare Other | Source: Ambulatory Visit | Attending: MEDICAL ONCOLOGY | Admitting: MEDICAL ONCOLOGY

## 2022-07-04 ENCOUNTER — Other Ambulatory Visit: Payer: Self-pay

## 2022-07-04 DIAGNOSIS — C773 Secondary and unspecified malignant neoplasm of axilla and upper limb lymph nodes: Secondary | ICD-10-CM | POA: Insufficient documentation

## 2022-07-04 DIAGNOSIS — Z85118 Personal history of other malignant neoplasm of bronchus and lung: Secondary | ICD-10-CM | POA: Insufficient documentation

## 2022-07-04 DIAGNOSIS — Z8542 Personal history of malignant neoplasm of other parts of uterus: Secondary | ICD-10-CM | POA: Insufficient documentation

## 2022-07-04 DIAGNOSIS — C801 Malignant (primary) neoplasm, unspecified: Secondary | ICD-10-CM | POA: Insufficient documentation

## 2022-07-04 LAB — CBC WITH DIFF
BASOPHIL #: 0.1 10*3/uL (ref 0.00–0.10)
BASOPHIL %: 1 % (ref 0–1)
EOSINOPHIL #: 0.4 10*3/uL (ref 0.00–0.50)
EOSINOPHIL %: 8 %
HCT: 35.2 % (ref 31.2–41.9)
HGB: 11.7 g/dL (ref 10.9–14.3)
LYMPHOCYTE #: 1.6 10*3/uL (ref 1.00–3.00)
LYMPHOCYTE %: 31 % (ref 16–44)
MCH: 29.8 pg (ref 24.7–32.8)
MCHC: 33.2 g/dL (ref 32.3–35.6)
MCV: 89.9 fL (ref 75.5–95.3)
MONOCYTE #: 0.4 10*3/uL (ref 0.30–1.00)
MONOCYTE %: 8 % (ref 5–13)
MPV: 9.6 fL (ref 7.9–10.8)
NEUTROPHIL #: 2.6 10*3/uL (ref 1.85–7.80)
NEUTROPHIL %: 52 % (ref 43–77)
PLATELETS: 152 10*3/uL (ref 140–440)
RBC: 3.92 10*6/uL (ref 3.63–4.92)
RDW: 14 % (ref 12.3–17.7)
WBC: 5.1 10*3/uL (ref 3.8–11.8)

## 2022-07-04 LAB — COMPREHENSIVE METABOLIC PANEL, NON-FASTING
ALBUMIN/GLOBULIN RATIO: 1.5 — ABNORMAL HIGH (ref 0.8–1.4)
ALBUMIN: 4 g/dL (ref 3.5–5.7)
ALKALINE PHOSPHATASE: 142 U/L — ABNORMAL HIGH (ref 34–104)
ALT (SGPT): 33 U/L (ref 7–52)
ANION GAP: 4 mmol/L (ref 4–13)
AST (SGOT): 25 U/L (ref 13–39)
BILIRUBIN TOTAL: 0.6 mg/dL (ref 0.3–1.2)
BUN/CREA RATIO: 16 (ref 6–22)
BUN: 16 mg/dL (ref 7–25)
CALCIUM, CORRECTED: 10.2 mg/dL (ref 8.9–10.8)
CALCIUM: 10.2 mg/dL (ref 8.6–10.3)
CHLORIDE: 107 mmol/L (ref 98–107)
CO2 TOTAL: 29 mmol/L (ref 21–31)
CREATININE: 1.01 mg/dL (ref 0.60–1.30)
ESTIMATED GFR: 57 mL/min/{1.73_m2} — ABNORMAL LOW (ref >59)
GLOBULIN: 2.7 — ABNORMAL LOW (ref 2.9–5.4)
GLUCOSE: 87 mg/dL (ref 74–109)
OSMOLALITY, CALCULATED: 280 mOsm/kg (ref 270–290)
POTASSIUM: 4.7 mmol/L (ref 3.5–5.1)
PROTEIN TOTAL: 6.7 g/dL (ref 6.4–8.9)
SODIUM: 140 mmol/L (ref 136–145)

## 2022-07-09 ENCOUNTER — Telehealth (INDEPENDENT_AMBULATORY_CARE_PROVIDER_SITE_OTHER): Payer: Self-pay | Admitting: MEDICAL ONCOLOGY

## 2022-07-09 DIAGNOSIS — Z85118 Personal history of other malignant neoplasm of bronchus and lung: Secondary | ICD-10-CM

## 2022-07-09 NOTE — Telephone Encounter (Signed)
Spoke to pt confirmed appt on 07-22-22 @ 9

## 2022-07-11 ENCOUNTER — Telehealth (INDEPENDENT_AMBULATORY_CARE_PROVIDER_SITE_OTHER): Payer: Self-pay | Admitting: MEDICAL ONCOLOGY

## 2022-07-11 ENCOUNTER — Other Ambulatory Visit (INDEPENDENT_AMBULATORY_CARE_PROVIDER_SITE_OTHER): Payer: Self-pay | Admitting: MEDICAL ONCOLOGY

## 2022-07-11 DIAGNOSIS — R2232 Localized swelling, mass and lump, left upper limb: Secondary | ICD-10-CM

## 2022-07-11 NOTE — Telephone Encounter (Signed)
Spoke to pt confirmed appt on 07-22-22 @ for a MRI 4:15 & confirmed f/u appt w/ Dr Reece Agar on 07-26-22 @ 11

## 2022-07-11 NOTE — Nursing Note (Signed)
Received fax that they were unable to perform CARIS testing on left axillary mass biopsy from 06/18/2022 r/t insufficient tumor. Informed Dr. Ortencia Kick with new order for bilateral breast MRI and referral to Dr. Donnal Debar for repeat biopsy. Called and informed patient, all questions answered to satisfaction, patient reports understanding and agreeable to plan. Encouraged to call for any questions or concerns as they arise.

## 2022-07-16 ENCOUNTER — Ambulatory Visit (INDEPENDENT_AMBULATORY_CARE_PROVIDER_SITE_OTHER): Payer: Medicare Other | Admitting: Surgery

## 2022-07-16 ENCOUNTER — Encounter (INDEPENDENT_AMBULATORY_CARE_PROVIDER_SITE_OTHER): Payer: Self-pay | Admitting: Surgery

## 2022-07-16 ENCOUNTER — Other Ambulatory Visit: Payer: Self-pay

## 2022-07-16 DIAGNOSIS — R2232 Localized swelling, mass and lump, left upper limb: Secondary | ICD-10-CM

## 2022-07-16 DIAGNOSIS — C773 Secondary and unspecified malignant neoplasm of axilla and upper limb lymph nodes: Secondary | ICD-10-CM

## 2022-07-17 ENCOUNTER — Encounter (INDEPENDENT_AMBULATORY_CARE_PROVIDER_SITE_OTHER): Payer: Self-pay | Admitting: Surgery

## 2022-07-17 ENCOUNTER — Ambulatory Visit (INDEPENDENT_AMBULATORY_CARE_PROVIDER_SITE_OTHER): Payer: Self-pay | Admitting: NURSE PRACTITIONER

## 2022-07-17 NOTE — Progress Notes (Signed)
GENERAL SURGERY, [facility]  [address]  [place] [address]    Progress Note    Name: Asmara Backs MRN:  [id number]   Date: 07/16/2022 DOB:  07/25/1940 (81 y.o.)                Date of Service:  07/17/2022  Myna Hidalgo, 82 y.o. female  Date of Birth:  May 29, 1940  PCP: Charm Rings, MD  Referring:  Brayton Mars     HPI:  Ginni Eichler is a 82 y.o. White female who returns after undergoing ultrasound-guided biopsy of left axillary mass.  The results came back consistent with non-small-cell carcinoma the etiology of which is unknown at this time.  She was had PET scan performed showed no other unusual findings other than what axillary mass the patient was appointment to see her medical oncologist next week.    Past Medical History:   Diagnosis Date    Diabetes mellitus, type 2 (CMS HCC)     Embolism (CMS HCC)     Endometrial cancer (CMS HCC)     HTN (hypertension)     Lung cancer (CMS HCC)     Macular degeneration (senile) of retina       Past Surgical History:   Procedure Laterality Date    HX CHOLECYSTECTOMY      HX COLONOSCOPY      HX HYSTERECTOMY      HX LOBECTOMY      LUNG CANCER SURGERY        Outpatient Medications Marked as Taking for the 07/16/22 encounter (Office Visit) with Kazuki Ingle, Cherylann Parr, MD   Medication Sig    esomeprazole magnesium (NEXIUM) 40 mg Oral Capsule, Delayed Release(E.C.) Take 1 Capsule (40 mg total) by mouth Every morning before breakfast    ezetimibe (ZETIA) 10 mg Oral Tablet Take 1 Tablet (10 mg total) by mouth Every evening    metFORMIN (GLUCOPHAGE) 500 mg Oral Tablet Take 1 Tablet (500 mg total) by mouth Twice daily with food    multivitamin with iron Oral Tablet Take 1 Tablet by mouth Once a day    sertraline (ZOLOFT) 50 mg Oral Tablet Take 1 Tablet (50 mg total) by mouth Once a day    telmisartan (MICARDIS) 80 mg Oral Tablet Take 1 Tablet (80 mg total) by mouth Once a day      Allergies   Allergen Reactions    Ciprofloxacin Hives/ Urticaria            BP (!) 153/78   Pulse 87   Temp 36.7 C (98 F)   Resp 18   Ht 1.626 m (5\' 4" )   Wt 95.6 kg (210 lb 12.8 oz)   SpO2 96%   BMI 36.18 kg/m          General: appropriate for age. in no acute distress.    Vital signs are present above and have been reviewed by me     HEENT: Atraumatic, Normocephalic.    Lungs: Nonlabored breathing with symmetric expansion    Left axillary mass palpable    Heart:Regular wth respect to rate and rythmn.    Abdomen:Soft. Nontender. Nondistended and benign    Psychiatric: Alert and oriented to person, place, and time. affect appropriate       Assessment/Plan:  Assessment/Plan   1. Axillary mass, left         We will await the evaluation next week by her medical oncologist to determine what their plan of  management is at that time.  If the patient needs to have the left axillary mass fully excised, I told the patient and family I will be readily available to do so.  I would like to see however what her medical oncologist plans 1st and if they want to proceed with excision.  I told the patient and family that I did not want to proceed and possibly "burn any bridges" by performing the excision prior to her medical oncologist seeing her.  They understood completely and agreed with that plan.  I gave the family my cell phone number to let me know what it was needed and if I can have.  The patient and family were very appreciative.    No follow-ups on file.     This note was partially created using voice recognition software and is inherently subject to errors including those of syntax and "sound alike " substitutions which may escape proof reading. In such instances, original meaning may be extrapolated by contextual derivation.    Domonique Brouillard B Culver Feighner, MD,MBA,FACS

## 2022-07-22 ENCOUNTER — Ambulatory Visit (INDEPENDENT_AMBULATORY_CARE_PROVIDER_SITE_OTHER): Payer: Self-pay | Admitting: MEDICAL ONCOLOGY

## 2022-07-22 ENCOUNTER — Other Ambulatory Visit: Payer: Self-pay

## 2022-07-22 ENCOUNTER — Inpatient Hospital Stay
Admission: RE | Admit: 2022-07-22 | Discharge: 2022-07-22 | Disposition: A | Discharge status: Home / Self Care | Payer: Medicare Other | Source: Ambulatory Visit | Attending: MEDICAL ONCOLOGY | Admitting: MEDICAL ONCOLOGY

## 2022-07-22 DIAGNOSIS — R2232 Localized swelling, mass and lump, left upper limb: Secondary | ICD-10-CM

## 2022-07-26 ENCOUNTER — Encounter (INDEPENDENT_AMBULATORY_CARE_PROVIDER_SITE_OTHER): Payer: Self-pay | Admitting: MEDICAL ONCOLOGY

## 2022-07-26 ENCOUNTER — Other Ambulatory Visit: Payer: Self-pay

## 2022-07-26 ENCOUNTER — Other Ambulatory Visit (INDEPENDENT_AMBULATORY_CARE_PROVIDER_SITE_OTHER): Payer: Self-pay | Admitting: NURSE PRACTITIONER

## 2022-07-26 ENCOUNTER — Ambulatory Visit: Payer: Medicare Other | Attending: MEDICAL ONCOLOGY | Admitting: MEDICAL ONCOLOGY

## 2022-07-26 VITALS — Ht 64.0 in

## 2022-07-26 DIAGNOSIS — Z90710 Acquired absence of both cervix and uterus: Secondary | ICD-10-CM | POA: Insufficient documentation

## 2022-07-26 DIAGNOSIS — Z85118 Personal history of other malignant neoplasm of bronchus and lung: Secondary | ICD-10-CM | POA: Insufficient documentation

## 2022-07-26 DIAGNOSIS — I82409 Acute embolism and thrombosis of unspecified deep veins of unspecified lower extremity: Secondary | ICD-10-CM

## 2022-07-26 DIAGNOSIS — Z86718 Personal history of other venous thrombosis and embolism: Secondary | ICD-10-CM | POA: Insufficient documentation

## 2022-07-26 DIAGNOSIS — Z8542 Personal history of malignant neoplasm of other parts of uterus: Secondary | ICD-10-CM | POA: Insufficient documentation

## 2022-07-26 DIAGNOSIS — Z902 Acquired absence of lung [part of]: Secondary | ICD-10-CM | POA: Insufficient documentation

## 2022-07-26 DIAGNOSIS — Z08 Encounter for follow-up examination after completed treatment for malignant neoplasm: Secondary | ICD-10-CM | POA: Insufficient documentation

## 2022-07-26 DIAGNOSIS — R918 Other nonspecific abnormal finding of lung field: Secondary | ICD-10-CM | POA: Insufficient documentation

## 2022-07-26 DIAGNOSIS — Z7901 Long term (current) use of anticoagulants: Secondary | ICD-10-CM | POA: Insufficient documentation

## 2022-07-26 DIAGNOSIS — R2232 Localized swelling, mass and lump, left upper limb: Secondary | ICD-10-CM

## 2022-07-26 DIAGNOSIS — C761 Malignant neoplasm of thorax: Secondary | ICD-10-CM | POA: Insufficient documentation

## 2022-07-26 DIAGNOSIS — D472 Monoclonal gammopathy: Secondary | ICD-10-CM | POA: Insufficient documentation

## 2022-07-26 MED ORDER — RIVAROXABAN 20 MG TABLET
20.0000 mg | ORAL_TABLET | Freq: Every evening | ORAL | 3 refills | Status: DC
Start: 2022-07-26 — End: 2022-10-22

## 2022-07-26 NOTE — Addendum Note (Signed)
Addended by: Marvene Staff on: 07/26/2022 02:18 PM     Modules accepted: Orders

## 2022-07-26 NOTE — Nursing Note (Signed)
Notified Westley Chandler that Dr. Ortencia Kick saw this mutual patient (with Dr. Donnal Debar) today and Dr. Ortencia Kick is in agreement for Dr. Donnal Debar to proceed with lumpectomy and axillary excision/staging. Confirmed that she would proceed with scheduling this.

## 2022-07-26 NOTE — Cancer Center Note (Signed)
Department of Hematology/Oncology    Name: Kelsey Preston  AVW:[id number]  Date of Birth: 1941/01/03  Encounter Date: 07/26/2022  Referring Provider: Fidela Juneau, MD  [address]  [place],  [place] [address]    [facility] CTS: Dr. Ewing Schlein.    Diagnosis:   1. 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  2. IgM Lambda MGUS.  3. Endometrial CA s/p TAH+BSO.    11/25/2007 endocervical curettage:  FIGO grade 1 adenocarcinoma.  12/16/2007 TAH+BSO: FIGO grade 1 adenocarcinoma.    HISTORY OF PRESENT ILLNESS  Kelsey Preston is a 82 y.o. female     Previously followed by retired Environmental health practitioner, Dr. Dennison Nancy.    Admitted to [facility] in 05/2022 for sudden onset chest discomfort and diaphoresis.  The patient was evaluated by the Cardiology and underwent a myocardial perfusion scan that showed a small moderate-sized reversible basal-mid inferolateral perfusion defect. Cardiology considered cardiac catheterization, however, the patient had an ultrasound which revealed the presence of an acute thrombus embolus in the left femoral vein. The patient was started on full anticoagulation therapy, which she was able to tolerate without issue.      She follows annually with [facility] Cardiothoracic surgery last seen on 07/26/2021.  She was being followed for a slowly enlarging right middle lobe ground-glass opacity.    She was seen by surgeon, Dr. Donnal Debar, for workup of left breast abnormality on mammogram and ultrasound.  A bilateral screening mammogram on 05/10/2022 showed a 1.9 cm low axillary lymph node on the left.  An ultrasound confirmed this abnormality.  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    ROS Pertinent review of systems as discussed in HPI.    HISTORY  Past Medical History:   Diagnosis Date    Diabetes mellitus, type 2 (CMS HCC)     Embolism (CMS HCC)     Endometrial cancer (CMS HCC)     HTN (hypertension)     Lung cancer (CMS HCC)      Macular degeneration (senile) of retina       Past Surgical History:   Procedure Laterality Date    HX CHOLECYSTECTOMY      HX COLONOSCOPY      HX HYSTERECTOMY      HX LOBECTOMY      LUNG CANCER SURGERY        Social History     Tobacco Use    Smoking status: Never    Smokeless tobacco: Never   Substance Use Topics    Alcohol use: Never    Drug use: Never      Family Medical History:       Problem Relation (Age of Onset)    Cancer Brother           Current Outpatient Medications   Medication Sig    aspirin 81 mg Oral Tablet, Chewable Chew 1 Tablet (81 mg total) Once a day for 30 days    atorvastatin (LIPITOR) 40 mg Oral Tablet Take 1 Tablet (40 mg total) by mouth Every evening for 30 days    esomeprazole magnesium (NEXIUM) 40 mg Oral Capsule, Delayed Release(E.C.) Take 1 Capsule (40 mg total) by mouth Every morning before breakfast    ezetimibe (ZETIA) 10 mg Oral Tablet Take 1 Tablet (10 mg total) by mouth Every evening    metFORMIN (GLUCOPHAGE) 500 mg Oral Tablet Take 1 Tablet (500 mg total) by mouth Twice daily with food  multivitamin with iron Oral Tablet Take 1 Tablet by mouth Once a day    rivaroxaban (XARELTO) 20 mg Oral Tablet Take 1 Tablet (20 mg total) by mouth Every evening with dinner for 30 days    sertraline (ZOLOFT) 50 mg Oral Tablet Take 1 Tablet (50 mg total) by mouth Once a day    telmisartan (MICARDIS) 80 mg Oral Tablet Take 1 Tablet (80 mg total) by mouth Once a day     Allergies   Allergen Reactions    Ciprofloxacin Hives/ Urticaria     VITALS  Vitals:    07/26/22 1053   Height: 1.626 m (5\' 4" )     PHYSICAL EXAM  ECOG Status: (1) Restricted in physically strenuous activity, ambulatory and able to do work of light nature   Physical Exam  Constitutional:       Appearance: Normal appearance.   HENT:      Head: Normocephalic and atraumatic.      Nose: Nose normal.      Mouth/Throat:      Mouth: Mucous membranes are moist.      Pharynx: Oropharynx is clear.   Cardiovascular:      Rate and Rhythm:  Normal rate and regular rhythm.      Pulses: Normal pulses.      Heart sounds: Normal heart sounds.   Pulmonary:      Effort: Pulmonary effort is normal.      Breath sounds: Normal breath sounds.   Abdominal:      General: Bowel sounds are normal.      Palpations: Abdomen is soft.   Musculoskeletal:         General: Normal range of motion.      Cervical back: Normal range of motion and neck supple.   Skin:     General: Skin is warm and dry.   Neurological:      General: No focal deficit present.      Mental Status: She is alert and oriented to person, place, and time.   Psychiatric:         Mood and Affect: Mood normal.         Behavior: Behavior normal.       DATA  07/25/2020 CT thorax without contrast  1. Stable postsurgical changes following left lower lobectomy  2. Mixed solid and ground-glass nodule in the right middle lobe with slight increase in size of the solid component compared to prior CT dated 10/2018 concerning for slow-growing adenocarcinoma.    Three other scattered pulmonary nodules are unchanged.      05/29/22 CTA Thorax: negative.    PATHOLOGY  Final Diagnosis   Left axillary mass, ultrasound-guided core needle biopsy:  Non-small cell carcinoma.  (See comment).   Electronically signed by Drue Second, MD on 06/20/2022 at [id number]   Diagnosis Comment    Immunoperoxidase stains for pancytokeratin, CK7, CK20, TTF 1, Napsin A, GATA 3, CDX2, PAX8, p63, HMB45, ER and PR are performed with appropriately staining controls.  The tumor cells are positive for pancytokeratin, CK7, GATA 3 and p63 (patchy), and are negative for CK20, TTF 1, Napsin A, CDX2, PAX8, HMB45, ER and PR.     The morphology and staining pattern is not entirely specific for a site of origin of the tumor.  It may represent breast carcinoma, that is ER and PR negative, a urothelial carcinoma and other sites can not be entirely excluded.       LABS  05/31/2020 CBC normal  GFR  51, ALP 131, remainder of CMP normal  SPE+IFE:  0.2 grams/deciliter  IgM lambda   IgM 382  Serum FLC: KLC : 41.8 mg/L, K/L 1.48    08/28/2020  PLTs 144K, remainder of CBC normal  GFR 52, ALP 131, remainder of CMP normal  SPE+IFE:  0.2 grams/deciliter IgM lambda   IgM 357  Serum FLC: KLC : 37 mg/L, K/L 1.67    LDH 178  CBC  Diff   Lab Results   Component Value Date/Time    WBC 5.1 07/04/2022 11:03 AM    HGB 11.7 07/04/2022 11:03 AM    HCT 35.2 07/04/2022 11:03 AM    PLTCNT 152 07/04/2022 11:03 AM    RBC 3.92 07/04/2022 11:03 AM    MCV 89.9 07/04/2022 11:03 AM    MCHC 33.2 07/04/2022 11:03 AM    MCH 29.8 07/04/2022 11:03 AM    RDW 14.0 07/04/2022 11:03 AM    MPV 9.6 07/04/2022 11:03 AM    Lab Results   Component Value Date/Time    PMNS 52 07/04/2022 11:03 AM    LYMPHOCYTES 31 07/04/2022 11:03 AM    EOSINOPHIL 8 07/04/2022 11:03 AM    MONOCYTES 8 07/04/2022 11:03 AM    BASOPHILS 1 07/04/2022 11:03 AM    BASOPHILS 0.10 07/04/2022 11:03 AM    PMNABS 2.60 07/04/2022 11:03 AM    LYMPHSABS 1.60 07/04/2022 11:03 AM    EOSABS 0.40 07/04/2022 11:03 AM    MONOSABS 0.40 07/04/2022 11:03 AM    BASABS 0.04 05/30/2022 05:40 AM        Comprehensive Metabolic Profile    Lab Results   Component Value Date    SODIUM 140 07/04/2022    POTASSIUM 4.7 07/04/2022    CHLORIDE 107 07/04/2022    CO2 29 07/04/2022    ANIONGAP 4 07/04/2022    BUN 16 07/04/2022    CREATININE 1.01 07/04/2022    ALBUMIN 4.0 07/04/2022    CALCIUM 10.2 07/04/2022    GLUCOSENF 87 07/04/2022    ALKPHOS 142 (H) 07/04/2022    ALT 33 07/04/2022    AST 25 07/04/2022    TOTBILIRUBIN 0.6 07/04/2022    TOTALPROTEIN 6.7 07/04/2022     ASSESSMENT  1. 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 Did not require adjuvant therapies.    Molecular profile:  Positive: EGFR exon 19.  Negative: KRAS, MET, RET, ALK, ROS1.    Evaluated by PCP initially with CXR showing left lower lobe mass.  A subsequent CT showed a 2.1 x 2.1 x 2.2 cm left lower lobe mass with no associated adenopathy.   Pathology from lobectomy showed a 2.4 cm adenocarcinoma with focal clear cell features.    2. 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    11/25/2007 endocervical curettage: FIGO grade 1 adenocarcinoma.  12/16/2007 TAH+BSO: FIGO grade 1 adenocarcinoma.    3. 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    She follows annually with [facility] Cardiothoracic surgery, last seen on 07/26/2021.  She was being followed for a slowly enlarging right middle lobe ground-glass opacity.    She was seen by surgeon, Dr. Donnal Debar, for workup of left breast abnormality on mammogram and ultrasound.  A bilateral screening mammogram on 05/10/2022 showed a 1.9 cm low axillary lymph node on the left.  An ultrasound confirmed this abnormality.  She proceeded to a 06/18/22 L axillary mass core biopsy  showing a non small-cell carcinoma of unclear origin.    A CTA Thorax in 3//2024 was negative. Would be unusual to see axillary metastases of NSCLC w/o widespread disease elsewhere. This could represent an ER/PR negative breast cancer versus non-small-cell lung carcinoma vs other.  CARIS GPS testing was ordered for further identification but there was insufficient tissue.      A 07/04/22 PET-CT showed only a hypermetabolic lesion in the left axilla measuring 1.6 x 2.3 cm, with max SUV 16.7. No other breast related lesion is seen on either side. MRI breasts was attempting but patient could not fit. Suspect breast primary at this point. Would proceed with lumpectomy + axillary staging.    4. 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    D/C ASA 81 mg PO daily.    5. IgM Lambda MGUS.    Labs dated 08/20/2019 revealed an M spike of 0.3 g per dL with immunofixation revealing lambda light chain specificity.    PLAN  1. Follow-up with Dr. Donnal Debar and proceed with lumpectomy + axillary staging..  2. RTC 4 weeks with CBC, CMP.    All relevant medical records were reviewed including available pertinent provider notes,  procedure notes, imaging, laboratory, and pathology. All pertinent labs and/or imaging were reviewed with the patient. Kelsey Preston was given the chance to ask questions, and these were answered to their satisfaction. The patient is welcome to call with any questions or concerns in the meantime.     On the day of the encounter, a total of 60 minutes was spent on this patient encounter including review of historical information, examination, documentation and post-visit activities.   Brayton Mars, MD  07/26/2022 , 11:05    CC:  Charm Rings, MD  [address]  [place] [address]    Fidela Juneau, MD  [address]  [place],  [place] [address]    This note was partially generated using MModal Fluency Direct system, and there may be some incorrect words, spellings, and punctuation that were not noted in checking the note before saving.

## 2022-07-30 ENCOUNTER — Telehealth (INDEPENDENT_AMBULATORY_CARE_PROVIDER_SITE_OTHER): Payer: Self-pay | Admitting: Surgery

## 2022-08-06 ENCOUNTER — Encounter (HOSPITAL_COMMUNITY)
Admission: RE | Disposition: A | Discharge status: Home / Self Care | Payer: Self-pay | Source: Ambulatory Visit | Attending: Surgery

## 2022-08-06 ENCOUNTER — Ambulatory Visit (HOSPITAL_COMMUNITY): Payer: Medicare Other | Admitting: Student in an Organized Health Care Education/Training Program

## 2022-08-06 ENCOUNTER — Encounter (HOSPITAL_COMMUNITY): Payer: Self-pay | Admitting: Surgery

## 2022-08-06 ENCOUNTER — Inpatient Hospital Stay
Admission: RE | Admit: 2022-08-06 | Discharge: 2022-08-06 | Disposition: A | Discharge status: Home / Self Care | Payer: Medicare Other | Source: Ambulatory Visit | Attending: Surgery | Admitting: Surgery

## 2022-08-06 ENCOUNTER — Other Ambulatory Visit: Payer: Self-pay

## 2022-08-06 ENCOUNTER — Encounter (HOSPITAL_COMMUNITY): Payer: Medicare Other | Admitting: Surgery

## 2022-08-06 DIAGNOSIS — Z85118 Personal history of other malignant neoplasm of bronchus and lung: Secondary | ICD-10-CM | POA: Insufficient documentation

## 2022-08-06 DIAGNOSIS — Z8542 Personal history of malignant neoplasm of other parts of uterus: Secondary | ICD-10-CM | POA: Insufficient documentation

## 2022-08-06 DIAGNOSIS — L821 Other seborrheic keratosis: Secondary | ICD-10-CM | POA: Insufficient documentation

## 2022-08-06 DIAGNOSIS — C773 Secondary and unspecified malignant neoplasm of axilla and upper limb lymph nodes: Secondary | ICD-10-CM | POA: Insufficient documentation

## 2022-08-06 SURGERY — LUMPECTOMY AND SENTINEL LYMPH NODE MAPPING
Anesthesia: General | Site: Breast | Laterality: Left | Wound class: Clean Wound: Uninfected operative wounds in which no inflammation occurred

## 2022-08-06 MED ORDER — CEFAZOLIN 1 GRAM SOLUTION FOR INJECTION
INTRAMUSCULAR | Status: AC
Start: 2022-08-06 — End: 2022-08-06
  Filled 2022-08-06: qty 20

## 2022-08-06 MED ORDER — LIDOCAINE (PF) 100 MG/5 ML (2 %) INTRAVENOUS SYRINGE
INJECTION | Freq: Once | INTRAVENOUS | Status: DC | PRN
Start: 2022-08-06 — End: 2022-08-06
  Administered 2022-08-06: 80 mg via INTRAVENOUS

## 2022-08-06 MED ORDER — ALBUTEROL SULFATE 2.5 MG/3 ML (0.083 %) SOLUTION FOR NEBULIZATION
2.5000 mg | INHALATION_SOLUTION | Freq: Once | RESPIRATORY_TRACT | Status: DC | PRN
Start: 2022-08-06 — End: 2022-08-06

## 2022-08-06 MED ORDER — TRAMADOL 50 MG TABLET
1.0000 | ORAL_TABLET | ORAL | 1 refills | Status: DC | PRN
Start: 2022-08-06 — End: 2022-10-18

## 2022-08-06 MED ORDER — PROPOFOL 10 MG/ML IV BOLUS
INJECTION | Freq: Once | INTRAVENOUS | Status: DC | PRN
Start: 2022-08-06 — End: 2022-08-06
  Administered 2022-08-06: 150 mg via INTRAVENOUS
  Administered 2022-08-06: 50 mg via INTRAVENOUS

## 2022-08-06 MED ORDER — FAMOTIDINE (PF) 20 MG/2 ML INTRAVENOUS SOLUTION
INTRAVENOUS | Status: AC
Start: 2022-08-06 — End: 2022-08-06
  Filled 2022-08-06: qty 2

## 2022-08-06 MED ORDER — SODIUM CHLORIDE 0.9 % (FLUSH) INJECTION SYRINGE
3.0000 mL | INJECTION | Freq: Three times a day (TID) | INTRAMUSCULAR | Status: DC
Start: 2022-08-06 — End: 2022-08-06

## 2022-08-06 MED ORDER — SUCCINYLCHOLINE 20 MG/ML INTRAVENOUS WRAPPER
INJECTION | Freq: Once | INTRAVENOUS | Status: DC | PRN
Start: 2022-08-06 — End: 2022-08-06
  Administered 2022-08-06: 140 mg via INTRAVENOUS

## 2022-08-06 MED ORDER — FENTANYL (PF) 50 MCG/ML INJECTION WRAPPER
INJECTION | Freq: Once | INTRAMUSCULAR | Status: DC | PRN
Start: 2022-08-06 — End: 2022-08-06
  Administered 2022-08-06 (×2): 50 ug via INTRAVENOUS

## 2022-08-06 MED ORDER — FENTANYL (PF) 50 MCG/ML INJECTION SOLUTION
INTRAMUSCULAR | Status: AC
Start: 2022-08-06 — End: 2022-08-06
  Filled 2022-08-06: qty 2

## 2022-08-06 MED ORDER — DEXAMETHASONE SODIUM PHOSPHATE 4 MG/ML INJECTION SOLUTION
4.0000 mg | Freq: Once | INTRAMUSCULAR | Status: AC
Start: 2022-08-06 — End: 2022-08-06
  Administered 2022-08-06: 4 mg via INTRAVENOUS

## 2022-08-06 MED ORDER — ONDANSETRON HCL (PF) 4 MG/2 ML INJECTION SOLUTION
4.0000 mg | Freq: Four times a day (QID) | INTRAMUSCULAR | Status: DC | PRN
Start: 2022-08-06 — End: 2022-08-06

## 2022-08-06 MED ORDER — ONDANSETRON HCL (PF) 4 MG/2 ML INJECTION SOLUTION
4.0000 mg | Freq: Once | INTRAMUSCULAR | Status: AC
Start: 2022-08-06 — End: 2022-08-06
  Administered 2022-08-06: 4 mg via INTRAVENOUS

## 2022-08-06 MED ORDER — ROCURONIUM 10 MG/ML INTRAVENOUS SOLUTION
Freq: Once | INTRAVENOUS | Status: DC | PRN
Start: 2022-08-06 — End: 2022-08-06
  Administered 2022-08-06: 5 mg via INTRAVENOUS

## 2022-08-06 MED ORDER — ACETAMINOPHEN 325 MG TABLET
650.0000 mg | ORAL_TABLET | ORAL | Status: DC | PRN
Start: 2022-08-06 — End: 2022-08-06

## 2022-08-06 MED ORDER — LACTATED RINGERS INTRAVENOUS SOLUTION
INTRAVENOUS | Status: DC
Start: 2022-08-06 — End: 2022-08-06
  Administered 2022-08-06: 0 via INTRAVENOUS

## 2022-08-06 MED ORDER — METOCLOPRAMIDE 5 MG/ML INJECTION SOLUTION
10.0000 mg | Freq: Four times a day (QID) | INTRAMUSCULAR | Status: DC | PRN
Start: 2022-08-06 — End: 2022-08-06

## 2022-08-06 MED ORDER — ONDANSETRON 4 MG DISINTEGRATING TABLET
4.0000 mg | ORAL_TABLET | Freq: Three times a day (TID) | ORAL | 0 refills | Status: DC | PRN
Start: 2022-08-06 — End: 2022-09-11

## 2022-08-06 MED ORDER — SODIUM CHLORIDE 0.9 % (FLUSH) INJECTION SYRINGE
3.0000 mL | INJECTION | INTRAMUSCULAR | Status: DC | PRN
Start: 2022-08-06 — End: 2022-08-06

## 2022-08-06 MED ORDER — LACTATED RINGERS INTRAVENOUS SOLUTION
INTRAVENOUS | Status: DC
Start: 2022-08-06 — End: 2022-08-06

## 2022-08-06 MED ORDER — HYDROMORPHONE 2 MG/ML INJECTION WRAPPER
0.2000 mg | INJECTION | INTRAMUSCULAR | Status: DC | PRN
Start: 2022-08-06 — End: 2022-08-06

## 2022-08-06 MED ORDER — KETOROLAC 30 MG/ML (1 ML) INJECTION SOLUTION
15.0000 mg | Freq: Four times a day (QID) | INTRAMUSCULAR | Status: DC | PRN
Start: 2022-08-06 — End: 2022-08-06

## 2022-08-06 MED ORDER — FAMOTIDINE (PF) 20 MG/2 ML INTRAVENOUS SOLUTION
20.0000 mg | Freq: Once | INTRAVENOUS | Status: AC
Start: 2022-08-06 — End: 2022-08-06
  Administered 2022-08-06: 20 mg via INTRAVENOUS

## 2022-08-06 MED ORDER — ONDANSETRON HCL (PF) 4 MG/2 ML INJECTION SOLUTION
4.0000 mg | Freq: Once | INTRAMUSCULAR | Status: DC | PRN
Start: 2022-08-06 — End: 2022-08-06

## 2022-08-06 MED ORDER — ONDANSETRON 4 MG DISINTEGRATING TABLET
4.0000 mg | ORAL_TABLET | Freq: Four times a day (QID) | ORAL | Status: DC | PRN
Start: 2022-08-06 — End: 2022-08-06

## 2022-08-06 MED ORDER — PROCHLORPERAZINE EDISYLATE 10 MG/2 ML (5 MG/ML) INJECTION SOLUTION
5.0000 mg | Freq: Once | INTRAMUSCULAR | Status: DC | PRN
Start: 2022-08-06 — End: 2022-08-06

## 2022-08-06 MED ORDER — CEFAZOLIN 1 GRAM SOLUTION FOR INJECTION
Freq: Once | INTRAMUSCULAR | Status: DC | PRN
Start: 2022-08-06 — End: 2022-08-06
  Administered 2022-08-06: 2000 mg via INTRAVENOUS
  Administered 2022-08-06: 1000 mg via INTRAVENOUS

## 2022-08-06 MED ORDER — FENTANYL (PF) 50 MCG/ML INJECTION WRAPPER
25.0000 ug | INJECTION | INTRAMUSCULAR | Status: DC | PRN
Start: 2022-08-06 — End: 2022-08-06

## 2022-08-06 MED ORDER — ONDANSETRON HCL (PF) 4 MG/2 ML INJECTION SOLUTION
INTRAMUSCULAR | Status: AC
Start: 2022-08-06 — End: 2022-08-06
  Filled 2022-08-06: qty 2

## 2022-08-06 MED ORDER — FENTANYL (PF) 50 MCG/ML INJECTION WRAPPER
50.0000 ug | INJECTION | INTRAMUSCULAR | Status: DC | PRN
Start: 2022-08-06 — End: 2022-08-06

## 2022-08-06 MED ORDER — EPHEDRINE SULFATE 50 MG/ML INTRAVENOUS SOLUTION
Freq: Once | INTRAVENOUS | Status: DC | PRN
Start: 2022-08-06 — End: 2022-08-06
  Administered 2022-08-06: 5 mg via INTRAVENOUS
  Administered 2022-08-06: 10 mg via INTRAVENOUS
  Administered 2022-08-06: 5 mg via INTRAVENOUS

## 2022-08-06 MED ORDER — IPRATROPIUM 0.5 MG-ALBUTEROL 3 MG (2.5 MG BASE)/3 ML NEBULIZATION SOLN
3.0000 mL | INHALATION_SOLUTION | Freq: Once | RESPIRATORY_TRACT | Status: DC | PRN
Start: 2022-08-06 — End: 2022-08-06

## 2022-08-06 MED ORDER — PHENYLEPHRINE 10 MG/ML INJECTION SOLUTION
Freq: Once | INTRAMUSCULAR | Status: DC | PRN
Start: 2022-08-06 — End: 2022-08-06
  Administered 2022-08-06 (×3): 100 ug via INTRAVENOUS

## 2022-08-06 MED ORDER — DEXAMETHASONE SODIUM PHOSPHATE 4 MG/ML INJECTION SOLUTION
INTRAMUSCULAR | Status: AC
Start: 2022-08-06 — End: 2022-08-06
  Filled 2022-08-06: qty 1

## 2022-08-06 MED ORDER — HYDROCODONE 5 MG-ACETAMINOPHEN 325 MG TABLET
1.0000 | ORAL_TABLET | ORAL | Status: DC | PRN
Start: 2022-08-06 — End: 2022-08-06
  Administered 2022-08-06: 1 via ORAL
  Filled 2022-08-06: qty 1

## 2022-08-06 SURGICAL SUPPLY — 31 items
ADH LIQUID LF  WTPRF VIAL PREP NONSTAIN MASTISOL STYRAX GUM MASTIC ALC MTHY SLCYT STRL CLR NHZR 2/3 (WOUND CARE SUPPLY) ×1 IMPLANT
BLADE 10 2 END CBNSTL SURG STRL DISP (SURGICAL CUTTING SUPPLIES) ×2 IMPLANT
CLEANER ESURG TIP 2X2IN TIP POLISHR CAUT STRL LF (SURGICAL CUTTING SUPPLIES) ×1 IMPLANT
CONV USE 123874 - SYRINGE AMSURE MDCHC 60CC LF  STRL TIP PRTC SM TUBE ADPR IRRG DISC BULB POLYPROP (MED SURG SUPPLIES) ×1 IMPLANT
COUNTER 20 CNT BLOCK ADH NEEDLE STRL LF  RD SHARP FOAM 15.75X11.5X14IN DISP (MED SURG SUPPLIES) ×1 IMPLANT
COVER 53X24IN MAYOSTAND PRXM STRL DISP EQP SMS LF (DRAPE/PACKS/SHEETS/OR TOWEL) ×1 IMPLANT
COVER TBL 90X50IN STD SMS REINF FNFLD STRL LF  DISP (DRAPE/PACKS/SHEETS/OR TOWEL) ×2 IMPLANT
DETERGENT INSTR 22OZ TRNSPT GEL RINSE FREE NEUT PH PREKLENZ CLR PLSNT LF (MISCELLANEOUS PT CARE ITEMS) ×1 IMPLANT
DRAPE FNFLD ABS REINF 77X53IN 43528 PRXM LF  STRL DISP SURG SMS 44X23IN (DRAPE/PACKS/SHEETS/OR TOWEL) ×2 IMPLANT
DSCT 21CM LIGASURE EXACT 40D 20.6MM 19.8MM 4MM CURVE JAW LOW TEMP PROF SURG 21.6X2MM VLAB (SURGICAL CUTTING SUPPLIES) ×1 IMPLANT
ELECTRODE ESURG BLADE PNCL 15FT VLAB EDGE TELESCP SMOKE EVAC (SURGICAL CUTTING SUPPLIES) ×1 IMPLANT
GLOVE SURG 7 LF  PF BEAD CUF STRL CRM 11.8IN PROTEXIS PI PLISPRN THK9.1 MIL (GLOVES AND ACCESSORIES) ×3 IMPLANT
GLOVE SURG 7 LF  PF SMOOTH BEAD CUF INTLK STRL BLU 11.8IN PROTEXIS NEU-THERA PLISPRN THK7.9 MIL (GLOVES AND ACCESSORIES) ×2 IMPLANT
GLOVE SURG 7.5 LF  PF BEAD CUF STRL CRM 11.8IN PROTEXIS PI PLISPRN THK9.1 MIL (GLOVES AND ACCESSORIES) ×1 IMPLANT
GOWN SURG LRG STD LGTH REG L3 NONREINFORCE BRTHBL TWL STRL LF  DISP BLU HALYARD SPECTRUM SMS (DRAPE/PACKS/SHEETS/OR TOWEL) ×2 IMPLANT
GOWN SURG XL STD LGTH L3 NONREINFORCE HKLP CLSR TWL STRL LF  DISP BLU SPECTRUM SMS (DRAPE/PACKS/SHEETS/OR TOWEL) ×1
GOWN SURG XL STD LGTH L3 NONREINFORCE HKLP CLSR TWL STRL LF DISP BLU SPECTRUM SMS (DRAPE/PACKS/SHEETS/OR TOWEL) ×1 IMPLANT
LABEL MED CORRECT MED LABELING SYS 4 FLG 2 SHEET 24 PRPRNT STRL (MED SURG SUPPLIES) ×1 IMPLANT
NEEDLE HYPO  23GA 1.5IN TW PRCSNGL SS POLYPROP REG BVL LL HUB DEHP-FR TRQS STRL LF  DISP (MED SURG SUPPLIES) ×2 IMPLANT
PACK SURG UBR BCK TBL CVR ZN REINF MAYO STAND CVR STRL DISP 90X44IN 54X23IN LF (CUSTOM TRAYS & PACK) ×1 IMPLANT
SLEEVE COMPRESS MED KNEE LGTH KENDALL SCD SEQ NONST LF  DISP 21- IN DVT PE (MED SURG SUPPLIES) ×1 IMPLANT
SOL IRRG 0.9% NACL 1000ML PLASTIC PR BTL ISTNC N-PYRG STRL LF (MEDICATIONS/SOLUTIONS) ×1 IMPLANT
SPONGE GAUZE 4X4IN AV GZ CLU COTTON ABS NWVN POSTOP LF  STRL DISP (WOUND CARE SUPPLY) ×2 IMPLANT
SPONGE LAP 18X18IN PREWASH RIGID TRY STRL LF  WHT (MED SURG SUPPLIES) ×2 IMPLANT
STRIP 4X.5IN STRSTRP PLSTR REINF SKNCLS WHT STRL LF (WOUND CARE SUPPLY) ×2 IMPLANT
SUTURE 2-0 CT2 VICRYL 27IN VIOL BRD COAT ABS (SUTURE/WOUND CLOSURE) ×2 IMPLANT
SUTURE 5-0 FS2 VICRYL 27IN UNDYED BRD COAT ABS (SUTURE/WOUND CLOSURE) ×2 IMPLANT
SYRINGE AMSURE MDCHC 60CC LF  STRL TIP PRTC SM TUBE ADPR IRRG DISC BULB POLYPROP (MED SURG SUPPLIES) ×1
SYRINGE LL 10ML LF  STRL GRAD N-PYRG DEHP-FR PVC FREE MED DISP (MED SURG SUPPLIES) ×2 IMPLANT
TOWEL 24X16IN COTTON BLU DISP SURG STRL LF (DRAPE/PACKS/SHEETS/OR TOWEL) ×3 IMPLANT
TUBING SUCT CLR 6FT .25IN ARGYLE PVC NCDTV STR MALE FEMALE MLD CONN STRL LF (MED SURG SUPPLIES) ×1 IMPLANT

## 2022-08-06 NOTE — Anesthesia Transfer of Care (Signed)
ANESTHESIA TRANSFER OF CARE   Kelsey Preston is a 83 y.o. ,female,     had Procedure(s):  LEFT BREAST LUMPECTOMY WITH LEFT AXILLARY LYMPH NODE BIOPSY  performed  08/06/22   Primary Service: Fidela Juneau, MD    Past Medical History:   Diagnosis Date   . Diabetes mellitus, type 2 (CMS HCC)    . Embolism (CMS HCC)    . Endometrial cancer (CMS HCC)    . HTN (hypertension)    . Lung cancer (CMS HCC)    . Macular degeneration (senile) of retina       Allergy History as of 08/06/22       CIPROFLOXACIN         Noted Status Severity Type Reaction    06/29/21 1207 Terrance Mass, RN 06/29/21 Active Low  Hives/ Urticaria              ADHESIVE         Noted Status Severity Type Reaction    08/06/22 0656 Thea Alken, RN 08/06/22 Active Medium  Rash                  I completed my transfer of care / handoff to the receiving personnel during which we discussed:  Access, Airway, All key/critical aspects of case discussed, Antibiotics, Analgesia, Expectation of post procedure, Fluids/Product, Gave opportunity for questions and acknowledgement of understanding, Labs and PMHx      Post Location: PACU                                                           Last OR Temp: Temperature: 36.3 C (97.4 F)  ABG:  POTASSIUM   Date Value Ref Range Status   07/04/2022 4.7 3.5 - 5.1 mmol/L Final     KETONES   Date Value Ref Range Status   05/29/2022 Negative Negative mg/dL Final     CALCIUM   Date Value Ref Range Status   07/04/2022 10.2 8.6 - 10.3 mg/dL Final     Calculated P Axis   Date Value Ref Range Status   05/29/2022 100 degrees Final     Calculated R Axis   Date Value Ref Range Status   05/29/2022 106 degrees Final     Calculated T Axis   Date Value Ref Range Status   05/29/2022 -6 degrees Final     Airway:* No LDAs found *  Blood pressure (!) 162/86, pulse (!) 101, temperature 36.3 C (97.4 F), resp. rate 14, SpO2 94%.

## 2022-08-06 NOTE — Anesthesia Postprocedure Evaluation (Signed)
Anesthesia Post Op Evaluation    Patient: Kelsey Preston  Procedure(s):  LEFT BREAST LUMPECTOMY WITH LEFT AXILLARY LYMPH NODE BIOPSY    Last Vitals:Temperature: 36.3 C (97.4 F) (08/06/22 1037)  Heart Rate: (!) 104 (08/06/22 1051)  BP (Non-Invasive): (!) 161/82 (08/06/22 1051)  Respiratory Rate: 18 (08/06/22 1051)  SpO2: 95 % (08/06/22 1051)    No notable events documented.    Patient is sufficiently recovered from the effects of anesthesia to participate in the evaluation and has returned to their pre-procedure level.  Patient location during evaluation: PACU       Patient participation: complete - patient participated  Level of consciousness: awake and alert and responsive to verbal stimuli    Pain management: adequate  Airway patency: patent    Anesthetic complications: no  Cardiovascular status: acceptable  Respiratory status: acceptable  Hydration status: acceptable  Patient post-procedure temperature: Pt Normothermic   PONV Status: Absent

## 2022-08-06 NOTE — Anesthesia Preprocedure Evaluation (Signed)
ANESTHESIA PRE-OP EVALUATION  Planned Procedure: LEFT BREAST LUMPECTOMY WITH LEFT AXILLARY LYMPH NODE BIOPSY (Left: Breast)  Review of Systems     anesthesia history negative     patient summary reviewed  nursing notes reviewed        Pulmonary  negative pulmonary ROS,    Cardiovascular    Hypertension, well controlled, ECG reviewed and DVT ,No peripheral edema,  Exercise Tolerance: > or = 4 METS        GI/Hepatic/Renal    GERD and well controlled        Endo/Other      type 2 diabetes/ controlled with oral medications    Neuro/Psych/MS   negative neuro/psych ROS,      Cancer    lung cancer,               Physical Assessment      Airway       Mallampati: II    TM distance: >3 FB    Neck ROM: full  Mouth Opening: good.            Dental           (+) missing           Pulmonary    Breath sounds clear to auscultation  (-) no rhonchi, no decreased breath sounds, no wheezes, no rales and no stridor     Cardiovascular    Rhythm: regular  Rate: Normal  (-) no friction rub, carotid bruit is not present, no peripheral edema and no murmur     Other findings          Plan  ASA 3     Planned anesthesia type: general     general anesthesia with endotracheal tube intubation      PONV Plan:  I plan to administer pharmcologic prophalaxis antiemetics                  Anesthesia issues/risks discussed are: Dental Injuries, Stroke, Aspiration, Cardiac Events/MI, Intraoperative Awareness/ Recall and Sore Throat.  Anesthetic plan and risks discussed with patient  signed consent obtained          Patient's NPO status is appropriate for Anesthesia.           Plan discussed with CRNA.

## 2022-08-06 NOTE — Discharge Instructions (Addendum)
RETURN TO THE OFFICE     IF ANY QUESTIONS OR CONCERNS CALL THE OFFICE    FOLLOW ANY INSTRUCTIONS GIVEN BY DR [facility] MEDICINE

## 2022-08-06 NOTE — H&P (Signed)
[facility]  General Surgery  History and Physical    Date of Service:  08/06/2022  Kelsey Preston, Kelsey Preston, 82 y.o. female  Date of Admission:  08/06/2022  Date of Birth:  08-04-1940  PCP: Charm Rings, MD    Reason for admission:  Left breast partial mastectomy with sentinel lymph node biopsy    HPI:  Kelsey Preston is a 82 y.o. White female who is admitted for LUNG CANCER METASTASIS      who returns after undergoing ultrasound-guided biopsy of left axillary mass.  The results came back consistent with non-small-cell carcinoma the etiology of which is unknown at this time.  She was had PET scan performed showed no other unusual findings other than what axillary mass     Past Medical History:   Diagnosis Date    Diabetes mellitus, type 2 (CMS HCC)     Embolism (CMS HCC)     Endometrial cancer (CMS HCC)     HTN (hypertension)     Lung cancer (CMS HCC)     Macular degeneration (senile) of retina       Past Surgical History:   Procedure Laterality Date    HX CHOLECYSTECTOMY      HX COLONOSCOPY      HX HYSTERECTOMY      HX LOBECTOMY      LUNG CANCER SURGERY        Social History     Tobacco Use    Smoking status: Never    Smokeless tobacco: Never   Vaping Use    Vaping status: Never Used   Substance Use Topics    Alcohol use: Never    Drug use: Never       Family Medical History:       Problem Relation (Age of Onset)    Cancer Brother           Medications Prior to Admission       Prescriptions    aspirin 81 mg Oral Tablet, Chewable    Chew 1 Tablet (81 mg total) Once a day for 30 days    atorvastatin (LIPITOR) 40 mg Oral Tablet    Take 1 Tablet (40 mg total) by mouth Every evening for 30 days    esomeprazole magnesium (NEXIUM) 40 mg Oral Capsule, Delayed Release(E.C.)    Take 1 Capsule (40 mg total) by mouth Every morning before breakfast    ezetimibe (ZETIA) 10 mg Oral Tablet    Take 1 Tablet (10 mg total) by mouth Every evening    metFORMIN (GLUCOPHAGE) 500 mg Oral Tablet    Take 1 Tablet (500 mg total) by  mouth Twice daily with food    multivitamin with iron Oral Tablet    Take 1 Tablet by mouth Once a day    rivaroxaban (XARELTO) 20 mg Oral Tablet    Take 1 Tablet (20 mg total) by mouth Every evening with dinner for 120 days    sertraline (ZOLOFT) 50 mg Oral Tablet    Take 1 Tablet (50 mg total) by mouth Once a day    telmisartan (MICARDIS) 80 mg Oral Tablet    Take 1 Tablet (80 mg total) by mouth Once a day           Allergies   Allergen Reactions    Adhesive Rash    Ciprofloxacin Hives/ Urticaria          No data found.       General: appropriate for age. in no  acute distress.    Vital signs are present above and have been reviewed by me     HEENT: Atraumatic, Normocephalic. PERRLA, EOMI. Nose clear. Throat clear.    Lungs: Nonlabored breathing with symmetric expansion.  Clear to auscultation bilaterally    Palpable left axillary/upper outer quadrant breast mass    Heart:Regular wth respect to rate and rythmn.    Abdomen:Soft. Nontender. Nondistended and benign    Extremities:  Grossly normal with good range of motion and no major deformities.    Neuro:  Grossly normal motor and sensory function. CN's II through XII intact.    Psychiatric: Alert and oriented to person, place, and time. affect appropriate    Laboratory Data:     No results found for any visits on 08/06/22 (from the past 24 hour(s)).    Imaging Studies:    No orders to display        Assessment/Plan:  LUNG CANCER METASTASIS    Left breast lumpectomy with possible sentinel lymph node mapping and biopsy (this mass may actually be an enlarged lymph node due to its location.)  Scheduled for Tuesday Aug 06, 2022     I discussed with the patient the risks, benefits, potential complications and options of operative and non operative therapy. I answered all of the questions and after full disclosure, the patient understood and wished to proceed with the planned management.    This note was partially created using voice recognition software and is inherently  subject to errors including those of syntax and "sound alike " substitutions which may escape proof reading. In such instances, original meaning may be extrapolated by contextual derivation.    Fidela Juneau, MD, MBA, FACS

## 2022-08-07 ENCOUNTER — Telehealth (INDEPENDENT_AMBULATORY_CARE_PROVIDER_SITE_OTHER): Payer: Self-pay | Admitting: Surgery

## 2022-08-07 DIAGNOSIS — L821 Other seborrheic keratosis: Secondary | ICD-10-CM

## 2022-08-07 DIAGNOSIS — C771 Secondary and unspecified malignant neoplasm of intrathoracic lymph nodes: Secondary | ICD-10-CM

## 2022-08-07 DIAGNOSIS — C3490 Malignant neoplasm of unspecified part of unspecified bronchus or lung: Secondary | ICD-10-CM

## 2022-08-07 NOTE — OR Surgeon (Addendum)
[facility]      Patient Name: Kelsey Preston, [place] Number: [id number]  Date of Service: 08/07/2022   Date of Birth: 06/17/1940      Pre-Operative Diagnosis: LUNG CANCER METASTASIS     Post-Operative Diagnosis: LUNG CANCER METASTASIS    Procedure(s)/Description:  LEFT AXILLARY MASS EXCISION; EXCISIONAL BIOPSY OF LEFT BREAST SKIN LESION    Attending Surgeon: Fidela Juneau, MD     Anesthesia:  Anesthesiologist: Cornett, Mayme Genta, MD  CRNA: Levi Aland, CRNA    Anesthesia Type: .General     Estimated Blood Loss:  Minimal      Specimens Removed:   ID Type Source Tests Collected by Time Destination   1 : LESION MID-CHEST Tissue Skin SURGICAL PATHOLOGY SPECIMEN Vivian Neuwirth B, MD 08/06/2022 1001    2 : LEFT AXILLARY MASS (REC'D FRESH) Tissue Lymph Node SURGICAL PATHOLOGY SPECIMEN Enas Winchel, Cherylann Parr, MD 08/06/2022 1002       Order Name Source Comment Collection Info Order Time   SURGICAL PATHOLOGY SPECIMEN Skin Pre-op diagnosis:  LUNG CANCER METASTASIS Collected By: Fidela Juneau, MD 08/06/2022 10:16 AM     Release to patient   Manual release only          Reason for preventing immediate release   Reasonable likelihood of causing patient harm          Additional details for preventing immediate release   Surgeon to discuss with patient.             The patient was brought to the operating suite and placed on the table in the supine position.   After GETA anesthesia was provided, the left breast and axilla was prepped and draped in the usual sterile fashion followed by injection of local anesthesia.   The area of concern in the left axilla, which was palpable, was then identified and the transverse skin incision was made. The dissection was carried down to the subcutaneous layer.  The mass was then excised using the Ligasure shearsand sent to pathology for evaluation.  The size of the mass excised was 2.5 centimeters.  The area was irrigated and dried. Hemostasis well obtained. The wound was closed  with 2-0 Vicryl suture followed by 5-0 Vicryl suture in a buried subcuticular fashion. Steri strips were applied after the operative site was cleaned and dried. Attention was turned to the skin lesion located along the medial aspect of the left breast.This was excised using bovie cautery. It appeared to be consistent with a keratotic lesion.  The skin lesion measured approximately 1 cm in diameter.  A sterile dressing was applied. The patient tolerated the procedure well. No complications were encountered. The patient was taken to the PACU in stable condition.    Madisen Ludvigsen B. Michelangelo Rindfleisch, MD, MBA, FACS  [facility] -General Surgery

## 2022-08-08 ENCOUNTER — Encounter (INDEPENDENT_AMBULATORY_CARE_PROVIDER_SITE_OTHER): Payer: Self-pay | Admitting: MEDICAL ONCOLOGY

## 2022-08-08 ENCOUNTER — Other Ambulatory Visit (INDEPENDENT_AMBULATORY_CARE_PROVIDER_SITE_OTHER): Payer: Self-pay | Admitting: MEDICAL ONCOLOGY

## 2022-08-08 ENCOUNTER — Other Ambulatory Visit: Payer: Medicare Other | Attending: MEDICAL ONCOLOGY | Admitting: MEDICAL ONCOLOGY

## 2022-08-08 DIAGNOSIS — R2232 Localized swelling, mass and lump, left upper limb: Secondary | ICD-10-CM

## 2022-08-20 ENCOUNTER — Ambulatory Visit (INDEPENDENT_AMBULATORY_CARE_PROVIDER_SITE_OTHER): Payer: Medicare Other | Admitting: Surgery

## 2022-08-20 ENCOUNTER — Encounter (INDEPENDENT_AMBULATORY_CARE_PROVIDER_SITE_OTHER): Payer: Self-pay | Admitting: Surgery

## 2022-08-20 ENCOUNTER — Other Ambulatory Visit: Payer: Self-pay

## 2022-08-20 VITALS — BP 140/74 | HR 94 | Temp 97.9°F | Resp 18 | Ht 64.0 in | Wt 210.0 lb

## 2022-08-20 DIAGNOSIS — Z9889 Other specified postprocedural states: Secondary | ICD-10-CM

## 2022-08-20 NOTE — Progress Notes (Signed)
GENERAL SURGERY, [facility]  [address]  [place] [address]    Post-Op/Procedure Note    Name: Kelsey Preston MRN:  [id number]   Date:   08/20/2022 DOB:  1940-10-17 (81 y.o.)               Subjective:  Kelsey Preston presents 3 weeks following a axillary mass excision.   She is eating a regular diet without difficulty.  No nausea, vomiting, or abdominal pain.  Bowel movements are Normal.    The patient is not having any pain..  No fevers.     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  Activity: Pt is to increase activities as tolerated.  Patient was instructed to allow the Steri-Strips come off and then to cleaned the incision with soap and water and apply antibiotic ointment daily.  The patient will follow with the medical oncologist and come back to see me as needed.  The patient and family were very appreciative of the care given.  Return if symptoms worsen or fail to improve.    Fidela Juneau, MD

## 2022-08-23 LAB — CARIS MI TUMOR SEEK HYBRID™ + IHCS AND OTHER TESTS BY TUMOR TYPE
CARIS ANDROGEN RECEPTOR: POSITIVE
CARIS Estrogen Receptor: NEGATIVE
CARIS Genomic Loss of Heterozygosity - Exome: HIGH
CARIS Her2/Neu: POSITIVE
CARIS PD-L1 (22C3): POSITIVE
CARIS PTEN: POSITIVE
CARIS Progesterone Receptor: NEGATIVE

## 2022-08-27 ENCOUNTER — Encounter (INDEPENDENT_AMBULATORY_CARE_PROVIDER_SITE_OTHER): Payer: Self-pay | Admitting: Surgery

## 2022-08-28 ENCOUNTER — Ambulatory Visit (HOSPITAL_COMMUNITY): Payer: Self-pay | Admitting: HEMATOLOGY-ONCOLOGY

## 2022-08-29 ENCOUNTER — Other Ambulatory Visit: Payer: Medicare Other | Attending: MEDICAL ONCOLOGY

## 2022-08-29 ENCOUNTER — Other Ambulatory Visit: Payer: Self-pay

## 2022-08-29 DIAGNOSIS — R2232 Localized swelling, mass and lump, left upper limb: Secondary | ICD-10-CM | POA: Insufficient documentation

## 2022-08-29 DIAGNOSIS — Z85118 Personal history of other malignant neoplasm of bronchus and lung: Secondary | ICD-10-CM | POA: Insufficient documentation

## 2022-08-29 LAB — COMPREHENSIVE METABOLIC PANEL, NON-FASTING
ALBUMIN/GLOBULIN RATIO: 1.3 (ref 0.8–1.4)
ALBUMIN: 3.6 g/dL (ref 3.5–5.7)
ALKALINE PHOSPHATASE: 117 U/L — ABNORMAL HIGH (ref 34–104)
ALT (SGPT): 17 U/L (ref 7–52)
ANION GAP: 4 mmol/L (ref 4–13)
AST (SGOT): 18 U/L (ref 13–39)
BILIRUBIN TOTAL: 0.6 mg/dL (ref 0.3–1.2)
BUN/CREA RATIO: 15 (ref 6–22)
BUN: 15 mg/dL (ref 7–25)
CALCIUM, CORRECTED: 10.4 mg/dL (ref 8.9–10.8)
CALCIUM: 10.1 mg/dL (ref 8.6–10.3)
CHLORIDE: 107 mmol/L (ref 98–107)
CO2 TOTAL: 30 mmol/L (ref 21–31)
CREATININE: 1.01 mg/dL (ref 0.60–1.30)
ESTIMATED GFR: 57 mL/min/{1.73_m2} — ABNORMAL LOW (ref >59)
GLOBULIN: 2.8 — ABNORMAL LOW (ref 2.9–5.4)
GLUCOSE: 217 mg/dL — ABNORMAL HIGH (ref 74–109)
OSMOLALITY, CALCULATED: 289 mOsm/kg (ref 270–290)
POTASSIUM: 4.2 mmol/L (ref 3.5–5.1)
PROTEIN TOTAL: 6.4 g/dL (ref 6.4–8.9)
SODIUM: 141 mmol/L (ref 136–145)

## 2022-08-29 LAB — CBC WITH DIFF
BASOPHIL #: 0 10*3/uL (ref 0.00–0.10)
BASOPHIL %: 1 % (ref 0–1)
EOSINOPHIL #: 0.1 10*3/uL (ref 0.00–0.50)
EOSINOPHIL %: 3 %
HCT: 33.5 % (ref 31.2–41.9)
HGB: 11.1 g/dL (ref 10.9–14.3)
LYMPHOCYTE #: 1.3 10*3/uL (ref 1.00–3.00)
LYMPHOCYTE %: 34 % (ref 16–44)
MCH: 29.7 pg (ref 24.7–32.8)
MCHC: 33.3 g/dL (ref 32.3–35.6)
MCV: 89.4 fL (ref 75.5–95.3)
MONOCYTE #: 0.3 10*3/uL (ref 0.30–1.00)
MONOCYTE %: 8 % (ref 5–13)
MPV: 8.6 fL (ref 7.9–10.8)
NEUTROPHIL #: 2.1 10*3/uL (ref 1.85–7.80)
NEUTROPHIL %: 55 % (ref 43–77)
PLATELETS: 151 10*3/uL (ref 140–440)
RBC: 3.74 10*6/uL (ref 3.63–4.92)
RDW: 13.5 % (ref 12.3–17.7)
WBC: 3.8 10*3/uL (ref 3.8–11.8)

## 2022-09-02 ENCOUNTER — Encounter (INDEPENDENT_AMBULATORY_CARE_PROVIDER_SITE_OTHER): Payer: Self-pay | Admitting: HEMATOLOGY-ONCOLOGY

## 2022-09-02 ENCOUNTER — Ambulatory Visit (INDEPENDENT_AMBULATORY_CARE_PROVIDER_SITE_OTHER): Payer: Self-pay

## 2022-09-02 ENCOUNTER — Ambulatory Visit: Payer: Medicare Other | Attending: HEMATOLOGY-ONCOLOGY | Admitting: HEMATOLOGY-ONCOLOGY

## 2022-09-02 ENCOUNTER — Other Ambulatory Visit: Payer: Self-pay

## 2022-09-02 VITALS — BP 174/91 | HR 77 | Temp 97.3°F | Ht 64.0 in | Wt 216.4 lb

## 2022-09-02 DIAGNOSIS — Z9889 Other specified postprocedural states: Secondary | ICD-10-CM | POA: Insufficient documentation

## 2022-09-02 DIAGNOSIS — M40209 Unspecified kyphosis, site unspecified: Secondary | ICD-10-CM | POA: Insufficient documentation

## 2022-09-02 DIAGNOSIS — C50919 Malignant neoplasm of unspecified site of unspecified female breast: Secondary | ICD-10-CM | POA: Insufficient documentation

## 2022-09-02 DIAGNOSIS — Z85118 Personal history of other malignant neoplasm of bronchus and lung: Secondary | ICD-10-CM | POA: Insufficient documentation

## 2022-09-02 DIAGNOSIS — Z8542 Personal history of malignant neoplasm of other parts of uterus: Secondary | ICD-10-CM | POA: Insufficient documentation

## 2022-09-02 NOTE — Progress Notes (Unsigned)
Department of Hematology/Oncology  Progress Note   Name: Kelsey Preston  ZOX:[id number]  Date of Birth: January 17, 1941  Encounter Date: 09/02/2022    REFERRING PROVIDER:  Charm Rings, MD  [address]  [place],  [place] [address]    TELEMEDICINE DOCUMENTATION:  Patient Location:  [facility], [facility] outpatient Hematology/Oncology [address] [place] [address]  Patient/family aware of provider location:  yes  Patient/family consent for telemedicine:  yes    REASON FOR OFFICE VISIT:  Follow Up     HISTORY OF PRESENT ILLNESS:  Kelsey Preston is a 82 y.o. female who presents today for follow up of breast cancer     The patient has a history of non-small-cell lung cancer, status post lobectomy in 2014.      She also has a history of endometrial cancer, and is status post surgical resection of that cancer as well.    More recently, the patient was found to have a left axillary lesion measuring 2.3 cm on PET-CT scan.  For some reason, I can not find an actual size measurement on the pathology report.  Multiple imaging studies of the breast were performed, but no primary lesion was found.  The malignant lesion was definitively classified as a lymph node.    Receptor staining showed that it was ER negative and HER2 Neu positive.  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    ROS:   Pertinent review of systems as discussed in HPI    HISTORY:  Past Medical History:   Diagnosis Date    Diabetes mellitus, type 2 (CMS HCC)     Embolism (CMS HCC)     Endometrial cancer (CMS HCC)     HTN (hypertension)     Lung cancer (CMS HCC)     Macular degeneration (senile) of retina          Past Surgical History:   Procedure Laterality Date    HX CHOLECYSTECTOMY      HX COLONOSCOPY      HX HYSTERECTOMY      HX LOBECTOMY      LUNG CANCER SURGERY           Social History     Socioeconomic History    Marital status: Married     Spouse name: Not on file    Number of children: Not on file    Years of education: Not on file     Highest education level: Not on file   Occupational History    Not on file   Tobacco Use    Smoking status: Never    Smokeless tobacco: Never   Vaping Use    Vaping status: Never Used   Substance and Sexual Activity    Alcohol use: Never    Drug use: Never    Sexual activity: Not on file   Other Topics Concern    Not on file   Social History Narrative    Not on file     Social Determinants of Health     Financial Resource Strain: Not on file   Transportation Needs: Not on file   Social Connections: Low Risk  (05/30/2022)    Social Connections     SDOH Social Isolation: 5 or more times a week   Intimate Partner Violence: Not on file   Housing Stability: Not on file     Family Medical History:       Problem Relation (Age of Onset)    Cancer Brother  Current Outpatient Medications   Medication Sig    atorvastatin (LIPITOR) 40 mg Oral Tablet Take 1 Tablet (40 mg total) by mouth Every evening for 30 days    esomeprazole magnesium (NEXIUM) 40 mg Oral Capsule, Delayed Release(E.C.) Take 1 Capsule (40 mg total) by mouth Every morning before breakfast    ezetimibe (ZETIA) 10 mg Oral Tablet Take 1 Tablet (10 mg total) by mouth Every evening    famotidine (PEPCID) 40 mg Oral Tablet Take 1 Tablet (40 mg total) by mouth Once a day    hydroCHLOROthiazide (HYDRODIURIL) 25 mg Oral Tablet Take 1 Tablet (25 mg total) by mouth Once a day    metFORMIN (GLUCOPHAGE XR) 500 mg Oral Tablet Sustained Release 24 hr Take 1 Tablet (500 mg total) by mouth Twice daily    metFORMIN (GLUCOPHAGE) 500 mg Oral Tablet Take 1 Tablet (500 mg total) by mouth Twice daily with food    multivitamin with iron Oral Tablet Take 1 Tablet by mouth Once a day    ondansetron (ZOFRAN ODT) 4 mg Oral Tablet, Rapid Dissolve Take 1 Tablet (4 mg total) by mouth Every 8 hours as needed for Nausea/Vomiting    rivaroxaban (XARELTO) 20 mg Oral Tablet Take 1 Tablet (20 mg total) by mouth Every evening with dinner for 120 days    sertraline (ZOLOFT) 50 mg Oral Tablet  Take 1 Tablet (50 mg total) by mouth Once a day    telmisartan (MICARDIS) 80 mg Oral Tablet Take 1 Tablet (80 mg total) by mouth Once a day    traMADoL (ULTRAM) 50 mg Oral Tablet Take 1 Tablet (50 mg total) by mouth Every 4 hours as needed for Pain     Allergies   Allergen Reactions    Adhesive Rash    Ciprofloxacin Hives/ Urticaria       PHYSICAL EXAM:  Most Recent Vitals    Flowsheet Row Telemedicine from 09/02/2022 in Hematology/Oncology, [facility]   Temperature 36.3 C (97.3 F) filed at... 09/02/2022 0911   Heart Rate 77 filed at... 09/02/2022 0911   Respiratory Rate --   BP (Non-Invasive) 174/91 filed at... 09/02/2022 0911   SpO2 95 % filed at... 09/02/2022 0911   Height 1.626 m (5\' 4" ) filed at... 09/02/2022 0911   Weight 98.2 kg (216 lb 6.4 oz) filed at... 09/02/2022 0911   BMI (Calculated) 37.22 filed at... 09/02/2022 0911   BSA (Calculated) 2.11 filed at... 09/02/2022 0911      ECOG Status: (1) Restricted in physically strenuous activity, ambulatory and able to do work of light nature   Physical Exam    DIAGNOSTIC DATA:  No results found for this or any previous visit (from the past [address] hour(s)).    LABS:   CBC  Diff   Lab Results   Component Value Date/Time    WBC 3.8 08/29/2022 01:25 PM    HGB 11.1 08/29/2022 01:25 PM    HCT 33.5 08/29/2022 01:25 PM    PLTCNT 151 08/29/2022 01:25 PM    RBC 3.74 08/29/2022 01:25 PM    MCV 89.4 08/29/2022 01:25 PM    MCHC 33.3 08/29/2022 01:25 PM    MCH 29.7 08/29/2022 01:25 PM    RDW 13.5 08/29/2022 01:25 PM    MPV 8.6 08/29/2022 01:25 PM    Lab Results   Component Value Date/Time    PMNS 55 08/29/2022 01:25 PM    LYMPHOCYTES 34 08/29/2022 01:25 PM    EOSINOPHIL 3 08/29/2022 01:25 PM  MONOCYTES 8 08/29/2022 01:25 PM    BASOPHILS 1 08/29/2022 01:25 PM    BASOPHILS 0.00 08/29/2022 01:25 PM    PMNABS 2.10 08/29/2022 01:25 PM    LYMPHSABS 1.30 08/29/2022 01:25 PM    EOSABS 0.10 08/29/2022 01:25 PM    MONOSABS 0.30 08/29/2022 01:25 PM    BASABS 0.04 05/30/2022 05:40  AM            Comprehensive Metabolic Profile    Lab Results   Component Value Date    SODIUM 141 08/29/2022    POTASSIUM 4.2 08/29/2022    CHLORIDE 107 08/29/2022    CO2 30 08/29/2022    ANIONGAP 4 08/29/2022    BUN 15 08/29/2022    CREATININE 1.01 08/29/2022    ALBUMIN 3.6 08/29/2022    CALCIUM 10.1 08/29/2022    GLUCOSENF 217 (H) 08/29/2022    ALKPHOS 117 (H) 08/29/2022    ALT 17 08/29/2022    AST 18 08/29/2022    TOTBILIRUBIN 0.6 08/29/2022    TOTALPROTEIN 6.4 08/29/2022          BASIC METABOLIC PANEL  Lab Results   Component Value Date    SODIUM 141 08/29/2022    POTASSIUM 4.2 08/29/2022    CHLORIDE 107 08/29/2022    CO2 30 08/29/2022    ANIONGAP 4 08/29/2022    BUN 15 08/29/2022    CREATININE 1.01 08/29/2022    BUNCRRATIO 15 08/29/2022    GFR 57 (L) 08/29/2022    CALCIUM 10.1 08/29/2022    GLUCOSE Negative 05/29/2022    GLUCOSENF 217 (H) 08/29/2022           ASSESSMENT:  Problem List Items Addressed This Visit          Oncology    History of lung cancer - Primary     Other Visit Diagnoses       Breast cancer (CMS HCC)                 ICD-10-CM    1. History of lung cancer  Z85.118       2. Breast cancer (CMS HCC)  C50.919            PLAN:   1. All relevant medical records were reviewed including available pertinent provider notes, procedure notes, imaging, laboratory, and pathology.   2. All pertinent labs and/or imaging were reviewed with the patient.   3. Breast cancer:  Status post resection of an involved lymph node that was 2.3 cm on PET/CT.  No primary lesion was seen on imaging studies.  T0 N1 M0, stage IIA.  ER negative, HER2 Neu positive.  After resection of the node, she was rendered cancer-free radiographically.  We reviewed the NCCN guidelines, which recommend 3 potential treatment options:    A.) Paclitaxel plus trastuzumab.  This would be the most gentle of the 3 options.  We discussed that gradual onset of neuropathy would be the primary concern.  If a primary goal is the avoidance of side  effects from chemotherapy, then this would be the best choice.  B.)  Docetaxel/carboplatin/trastuzumab:  This would be a more intense option, with increased risk of myelosuppression and alopecia.   C.)  Docetaxel/carboplatin/trastuzumab/pertuzumab:  This would be the most effective option, but also carry the most side effects.  It would be the same as option be above, with the addition of significant diarrhea.    There is also the consideration of the inability to find a primary lesion.  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  The 3 options here would be 1.)  Try to locate an MRI machine with a larger bore diameter.  It is possible that even if this is done, no lesion might end up being seen if the scan can be performed.  If a lesion is seen, then lumpectomy would be a consideration. 2.)  Whole breast radiation therapy.  This would reduce risk of local recurrence with the assumption that any potential residual disease would be mitigated by this treatment.  It is the nonsurgical option. 3.)  Left-sided mastectomy for the same reason as above, but it would allow her to avoid radiation if she has strong feelings against it being performed.    After discussing all of the above, the patient and family would like to have a week in order to review the information and determine which options they would like to proceed with.  We did discuss that if the MRI is desired, it would need to be done before chemo as the chemo would make it even less likely for something to be seen later.  Otherwise, chemotherapy would be the next step, followed by radiation.    4. History of lung cancer:  Continue surveillance.    5. History of gynecologic cancer: Continue surveillance as well.    Mikaylah Malki was given the chance to ask questions, and these were answered to their satisfaction. The patient is welcome to call with any questions or  concerns in the meantime.     On the day of the encounter, a total of 60 minutes was spent on this patient encounter including review of historical information, examination, documentation and post-visit activities.   Return in about 1 week (around 09/09/2022).     Lupita Dawn, MD  09/02/2022 , 10:43  The patient was seen as part of a collaborative telemedicine service with Dr. [name] who participated in the encounter by active presence via approved video/audio means for portions of the encounter.  The patient's insurance company bears full legal and financial responsibility resulting from any deviations that they cause to my recommended treatment plan.   CC:  Charm Rings, MD  [address]  [place] [address]    Charm Rings, MD  [address]  [place],  [place] [address]    This note was partially generated using MModal Fluency Direct system, and there may be some incorrect words, spellings, and punctuation that were not noted in checking the note before saving.

## 2022-09-10 ENCOUNTER — Encounter (INDEPENDENT_AMBULATORY_CARE_PROVIDER_SITE_OTHER): Payer: Self-pay | Admitting: HEMATOLOGY-ONCOLOGY

## 2022-09-10 ENCOUNTER — Other Ambulatory Visit: Payer: Self-pay

## 2022-09-10 ENCOUNTER — Ambulatory Visit: Payer: Medicare Other | Attending: HEMATOLOGY-ONCOLOGY | Admitting: HEMATOLOGY-ONCOLOGY

## 2022-09-10 ENCOUNTER — Inpatient Hospital Stay (INDEPENDENT_AMBULATORY_CARE_PROVIDER_SITE_OTHER)
Admission: RE | Admit: 2022-09-10 | Discharge: 2022-09-10 | Disposition: A | Discharge status: Home / Self Care | Payer: Medicare Other | Source: Ambulatory Visit | Attending: HEMATOLOGY-ONCOLOGY | Admitting: HEMATOLOGY-ONCOLOGY

## 2022-09-10 VITALS — BP 133/70 | HR 73 | Temp 97.7°F | Ht 64.0 in | Wt 212.4 lb

## 2022-09-10 DIAGNOSIS — Z85118 Personal history of other malignant neoplasm of bronchus and lung: Secondary | ICD-10-CM

## 2022-09-10 DIAGNOSIS — Z902 Acquired absence of lung [part of]: Secondary | ICD-10-CM | POA: Insufficient documentation

## 2022-09-10 DIAGNOSIS — R5383 Other fatigue: Secondary | ICD-10-CM

## 2022-09-10 DIAGNOSIS — Z08 Encounter for follow-up examination after completed treatment for malignant neoplasm: Secondary | ICD-10-CM | POA: Insufficient documentation

## 2022-09-10 DIAGNOSIS — Z8542 Personal history of malignant neoplasm of other parts of uterus: Secondary | ICD-10-CM | POA: Insufficient documentation

## 2022-09-10 DIAGNOSIS — C50919 Malignant neoplasm of unspecified site of unspecified female breast: Secondary | ICD-10-CM

## 2022-09-10 DIAGNOSIS — Z853 Personal history of malignant neoplasm of breast: Secondary | ICD-10-CM | POA: Insufficient documentation

## 2022-09-10 LAB — THYROXINE, FREE (FREE T4): THYROXINE (T4), FREE: 0.93 ng/dL (ref 0.58–1.64)

## 2022-09-10 NOTE — Progress Notes (Unsigned)
Department of Hematology/Oncology  Progress Note   Name: Kelsey Preston  ZOX:[id number]  Date of Birth: 1941/01/17  Encounter Date: 09/10/2022    REFERRING PROVIDER:  Charm Rings, MD  [address]  [place],  [place] [address]    REASON FOR OFFICE VISIT:  Breast Cancer     HISTORY OF PRESENT ILLNESS:  Kelsey Preston is a 82 y.o. female who presents today for follow up of breast cancer     The patient has a history of non-small-cell lung cancer, status post lobectomy in 2014.      She also has a history of endometrial cancer, and is status post surgical resection of that cancer as well.    More recently, the patient was found to have a left axillary lesion measuring 2.3 cm on PET-CT scan.  For some reason, I can not find an actual size measurement on the pathology report.  Multiple imaging studies of the breast were performed, but no primary lesion was found.  The malignant lesion was definitively classified as a lymph node.    Receptor staining showed that it was ER negative and HER2 Neu positive.  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    09/10/2022: The patient is here for follow up of early stage breast cancer.  She is doing well and denies any new problems at this time.    ROS:   Pertinent review of systems as discussed in HPI    HISTORY:  Past Medical History:   Diagnosis Date    Diabetes mellitus, type 2 (CMS HCC)     Embolism (CMS HCC)     Endometrial cancer (CMS HCC)     HTN (hypertension)     Lung cancer (CMS HCC)     Macular degeneration (senile) of retina          Past Surgical History:   Procedure Laterality Date    HX CHOLECYSTECTOMY      HX COLONOSCOPY      HX HYSTERECTOMY      HX LOBECTOMY      LUNG CANCER SURGERY           Social History     Socioeconomic History    Marital status: Married     Spouse name: Not on file    Number of children: Not on file    Years of education: Not on file    Highest education level: Not on file   Occupational History    Not on file   Tobacco Use    Smoking status:  Never    Smokeless tobacco: Never   Vaping Use    Vaping status: Never Used   Substance and Sexual Activity    Alcohol use: Never    Drug use: Never    Sexual activity: Not on file   Other Topics Concern    Not on file   Social History Narrative    Not on file     Social Determinants of Health     Financial Resource Strain: Not on file   Transportation Needs: Not on file   Social Connections: Low Risk  (05/30/2022)    Social Connections     SDOH Social Isolation: 5 or more times a week   Intimate Partner Violence: Not on file   Housing Stability: Not on file     Family Medical History:       Problem Relation (Age of Onset)    Cancer Brother  Current Outpatient Medications   Medication Sig    atorvastatin (LIPITOR) 40 mg Oral Tablet Take 1 Tablet (40 mg total) by mouth Every evening for 30 days    esomeprazole magnesium (NEXIUM) 40 mg Oral Capsule, Delayed Release(E.C.) Take 1 Capsule (40 mg total) by mouth Every morning before breakfast    ezetimibe (ZETIA) 10 mg Oral Tablet Take 1 Tablet (10 mg total) by mouth Every evening    famotidine (PEPCID) 40 mg Oral Tablet Take 1 Tablet (40 mg total) by mouth Once a day    hydroCHLOROthiazide (HYDRODIURIL) 25 mg Oral Tablet Take 1 Tablet (25 mg total) by mouth Once a day    metFORMIN (GLUCOPHAGE XR) 500 mg Oral Tablet Sustained Release 24 hr Take 1 Tablet (500 mg total) by mouth Twice daily    metFORMIN (GLUCOPHAGE) 500 mg Oral Tablet Take 1 Tablet (500 mg total) by mouth Twice daily with food    multivitamin with iron Oral Tablet Take 1 Tablet by mouth Once a day    ondansetron (ZOFRAN ODT) 4 mg Oral Tablet, Rapid Dissolve Take 1 Tablet (4 mg total) by mouth Every 8 hours as needed for Nausea/Vomiting    rivaroxaban (XARELTO) 20 mg Oral Tablet Take 1 Tablet (20 mg total) by mouth Every evening with dinner for 120 days    sertraline (ZOLOFT) 50 mg Oral Tablet Take 1 Tablet (50 mg total) by mouth Once a day    telmisartan (MICARDIS) 80 mg Oral Tablet Take 1 Tablet  (80 mg total) by mouth Once a day    traMADoL (ULTRAM) 50 mg Oral Tablet Take 1 Tablet (50 mg total) by mouth Every 4 hours as needed for Pain     Allergies   Allergen Reactions    Adhesive Rash    Ciprofloxacin Hives/ Urticaria       PHYSICAL EXAM:  Most Recent Vitals    Flowsheet Row Telemedicine from 09/02/2022 in Hematology/Oncology, [facility]   Temperature 36.3 C (97.3 F) filed at... 09/02/2022 0911   Heart Rate 77 filed at... 09/02/2022 0911   Respiratory Rate --   BP (Non-Invasive) 174/91 filed at... 09/02/2022 0911   SpO2 95 % filed at... 09/02/2022 0911   Height 1.626 m (5\' 4" ) filed at... 09/02/2022 0911   Weight 98.2 kg (216 lb 6.4 oz) filed at... 09/02/2022 0911   BMI (Calculated) 37.22 filed at... 09/02/2022 0911   BSA (Calculated) 2.11 filed at... 09/02/2022 0911      ECOG Status: (1) Restricted in physically strenuous activity, ambulatory and able to do work of light nature   Physical Exam    DIAGNOSTIC DATA:  No results found for this or any previous visit (from the past [address] hour(s)).    LABS:   CBC  Diff   Lab Results   Component Value Date/Time    WBC 3.8 08/29/2022 01:25 PM    HGB 11.1 08/29/2022 01:25 PM    HCT 33.5 08/29/2022 01:25 PM    PLTCNT 151 08/29/2022 01:25 PM    RBC 3.74 08/29/2022 01:25 PM    MCV 89.4 08/29/2022 01:25 PM    MCHC 33.3 08/29/2022 01:25 PM    MCH 29.7 08/29/2022 01:25 PM    RDW 13.5 08/29/2022 01:25 PM    MPV 8.6 08/29/2022 01:25 PM    Lab Results   Component Value Date/Time    PMNS 55 08/29/2022 01:25 PM    LYMPHOCYTES 34 08/29/2022 01:25 PM    EOSINOPHIL 3 08/29/2022 01:25 PM  MONOCYTES 8 08/29/2022 01:25 PM    BASOPHILS 1 08/29/2022 01:25 PM    BASOPHILS 0.00 08/29/2022 01:25 PM    PMNABS 2.10 08/29/2022 01:25 PM    LYMPHSABS 1.30 08/29/2022 01:25 PM    EOSABS 0.10 08/29/2022 01:25 PM    MONOSABS 0.30 08/29/2022 01:25 PM    BASABS 0.04 05/30/2022 05:40 AM            Comprehensive Metabolic Profile    Lab Results   Component Value Date    SODIUM 141  08/29/2022    POTASSIUM 4.2 08/29/2022    CHLORIDE 107 08/29/2022    CO2 30 08/29/2022    ANIONGAP 4 08/29/2022    BUN 15 08/29/2022    CREATININE 1.01 08/29/2022    ALBUMIN 3.6 08/29/2022    CALCIUM 10.1 08/29/2022    GLUCOSENF 217 (H) 08/29/2022    ALKPHOS 117 (H) 08/29/2022    ALT 17 08/29/2022    AST 18 08/29/2022    TOTBILIRUBIN 0.6 08/29/2022    TOTALPROTEIN 6.4 08/29/2022          BASIC METABOLIC PANEL  Lab Results   Component Value Date    SODIUM 141 08/29/2022    POTASSIUM 4.2 08/29/2022    CHLORIDE 107 08/29/2022    CO2 30 08/29/2022    ANIONGAP 4 08/29/2022    BUN 15 08/29/2022    CREATININE 1.01 08/29/2022    BUNCRRATIO 15 08/29/2022    GFR 57 (L) 08/29/2022    CALCIUM 10.1 08/29/2022    GLUCOSE Negative 05/29/2022    GLUCOSENF 217 (H) 08/29/2022           ASSESSMENT:  Problem List Items Addressed This Visit          Oncology    History of lung cancer - Primary    Relevant Orders    THYROXINE, FREE (FREE T4)    T3 (TRIIODOTHYRONINE), FREE, SERUM    Refer to External Provider    Breast cancer (CMS HCC)    Relevant Orders    Refer to External Provider     Other Visit Diagnoses       Fatigue, unspecified type        Relevant Orders    THYROXINE, FREE (FREE T4)    T3 (TRIIODOTHYRONINE), FREE, SERUM    Refer to External Provider             ICD-10-CM    1. History of lung cancer  Z85.118 THYROXINE, FREE (FREE T4)     T3 (TRIIODOTHYRONINE), FREE, SERUM     Refer to External Provider      2. Fatigue, unspecified type  R53.83 THYROXINE, FREE (FREE T4)     T3 (TRIIODOTHYRONINE), FREE, SERUM     Refer to External Provider      3. Breast cancer (CMS HCC)  C50.919 Refer to External Provider           PLAN:   1. All relevant medical records were reviewed including available pertinent provider notes, procedure notes, imaging, laboratory, and pathology.   2. All pertinent labs and/or imaging were reviewed with the patient.   3. Breast cancer:  Status post resection of an involved lymph node that was 2.3 cm on PET/CT.   No primary lesion was seen on imaging studies.  T0 N1 M0, stage IIA.  ER negative, HER2 Neu positive.  After resection of the node, she was rendered cancer-free radiographically.  After reviewing the NCCN guidelines, the patient is in favor of moving  forward with paclitaxel and trastuzumab.  With the unknown primary location, we discussed the options of either no additional surgery and taking our chances versus radiation of the breast versus mastectomy.  I believe she is still undecided but maybe leaning towards mastectomy.  That is a decision that can be made later because we have 3 months of chemotherapy to perform 1st.  Radiation to the axillary area may be a requirement anyway due to the lymph node that was known to be involved.  4. History of lung cancer:  Continue surveillance.    5. History of gynecologic cancer: Continue surveillance as well.  6. Fatigue:  We discussed checking T3 and T4 levels and potentially treating to have her T4 be at the upper end of the normal range.  Once we have the results from the additional laboratory studies we will review them and discuss that further.    Kelsey Preston was given the chance to ask questions, and these were answered to their satisfaction. The patient is welcome to call with any questions or concerns in the meantime.     On the day of the encounter, a total of 35 minutes was spent on this patient encounter including review of historical information, examination, documentation and post-visit activities.   Return in about 2 weeks (around 09/24/2022).     Kelsey Dawn, MD  09/10/2022 , 16:29  The patient's insurance company bears full legal and financial responsibility resulting from any deviations that they cause to my recommended treatment plan.   CC:  Charm Rings, MD  [address]  [place] [address]    Charm Rings, MD  [address]  [place],  [place] [address]    This note was partially generated using MModal Fluency Direct system, and there may be some  incorrect words, spellings, and punctuation that were not noted in checking the note before saving.

## 2022-09-11 ENCOUNTER — Other Ambulatory Visit (INDEPENDENT_AMBULATORY_CARE_PROVIDER_SITE_OTHER): Payer: Self-pay | Admitting: HEMATOLOGY-ONCOLOGY

## 2022-09-11 ENCOUNTER — Other Ambulatory Visit (INDEPENDENT_AMBULATORY_CARE_PROVIDER_SITE_OTHER): Payer: Self-pay | Admitting: NURSE PRACTITIONER

## 2022-09-11 DIAGNOSIS — C50919 Malignant neoplasm of unspecified site of unspecified female breast: Secondary | ICD-10-CM

## 2022-09-11 DIAGNOSIS — Z853 Personal history of malignant neoplasm of breast: Secondary | ICD-10-CM

## 2022-09-11 MED ORDER — ONDANSETRON 4 MG DISINTEGRATING TABLET
4.0000 mg | ORAL_TABLET | Freq: Three times a day (TID) | ORAL | 3 refills | Status: DC | PRN
Start: 2022-09-11 — End: 2023-04-25

## 2022-09-12 ENCOUNTER — Telehealth (INDEPENDENT_AMBULATORY_CARE_PROVIDER_SITE_OTHER): Payer: Self-pay | Admitting: HEMATOLOGY-ONCOLOGY

## 2022-09-12 ENCOUNTER — Other Ambulatory Visit (INDEPENDENT_AMBULATORY_CARE_PROVIDER_SITE_OTHER): Payer: Self-pay | Admitting: HEMATOLOGY-ONCOLOGY

## 2022-09-12 DIAGNOSIS — C50919 Malignant neoplasm of unspecified site of unspecified female breast: Secondary | ICD-10-CM

## 2022-09-12 LAB — T3 (TRIIODOTHYRONINE), FREE, SERUM: T3 FREE: 2.4 pg/mL (ref 1.7–3.7)

## 2022-09-12 NOTE — Telephone Encounter (Signed)
Called daughter with appt for echo on 09-24-22 at 1:00

## 2022-09-17 ENCOUNTER — Encounter (INDEPENDENT_AMBULATORY_CARE_PROVIDER_SITE_OTHER): Payer: Self-pay | Admitting: HEMATOLOGY-ONCOLOGY

## 2022-09-17 NOTE — Nursing Note (Signed)
Messaged A. Gills and requested that Dr. Donnal Debar leave [facility] accessed upon insertion on 09/26/2022 for use on 09/27/2022.

## 2022-09-23 NOTE — Progress Notes (Signed)
MULTIDISCIPLINARY TUMOR BOARD DISCUSSION:     PATIENT:                 Kelsey Preston NUMBER:             [id number]  DOB:                          [birth year]  AGE:                          82 y.o.  DATE:                        09/25/2022     REFERRING PROVIDER: No ref. provider found  PCP: Charm Rings, MD     PRESENTER: Dr. Lupita Dawn  TYPE OF PRESENTATION: Prospective  PATHOLOGY REVIEWED AT [place]? Yes  RADIOGRAPHS REVIEWED AT [place]? Yes  NATIONAL GUIDELINES DISCUSSED?: Yes; AJCC/NCCN     DIAGNOSIS: Breast Cancer  DIAGNOSIS LOCATION: [place]  DIAGNOSIS METHOD: Biopsy  STAGE:  Stage IIA, T0, N1, M0  RECURRENT?: No     FAMILY HISTORY?: Not Significant  GENETIC TESTING?: No  PROGNOSTIC INDICATORS DISCUSSED: Yes     CLINICAL TRIAL PARTICIPATION: No  ELIGIBLE CLINICAL TRIALS: No     NEED FOR PALLIATIVE CARE?: No  PSYCHOSOCIAL CONCERNS?: No  REHABILITATION CONCERNS?: No  NUTRITIONAL CONCERNS?: No        THE PATIENT'S MOST RECENT CLINICAL INFORMATION, IMAGING, AND PATHOLOGY WAS DISCUSSED TODAY AT [place] MULTIDISCIPLINARY L TUMOR BOARD BY THE MEDICAL ONCOLOGY, RADIATION ONCOLOGY, SURGERY, RADIOLOGY, PATHOLOGY, SOCIAL SERVICES, REHAB, AND NURSING SERVICES.     PRELIMINARY RECOMMENDATIONS BASED ON TODAY'S TUMOR BOARD DISCUSSION: Breast cancer:  Status post resection of an involved lymph node that was 2.3 cm on PET/CT.  No primary lesion was seen on imaging studies. T0 N1 M0, stage IIA.  ER negative, HER2 Neu positive.  After resection of the node, she was rendered cancer-free radiographically.  We reviewed the NCCN guidelines, which recommend 3 potential treatment options:     A.) Paclitaxel plus trastuzumab.  This would be the most gentle of the 3 options.  We discussed that gradual onset of neuropathy would be the primary concern.  If a primary goal is the avoidance of side effects from chemotherapy, then this would be the best choice    B.)  Docetaxel/carboplatin/trastuzumab:  This would be a more  intense option, with increased risk of myelosuppression and alopecia.   C.) Docetaxel/carboplatin/trastuzumab/pertuzumab:  This would be the most effective option, but also carry the most side effects.  It would be the same as option be above, with the addition of significant diarrhea.     Will start Paclitaxel plus Tratuzumab on 09/27/2022 weekly for 12 weeks.      Gearldene Fiorenza, CLINICAL RESEARCH SPECIALIST

## 2022-09-24 ENCOUNTER — Other Ambulatory Visit: Payer: Self-pay

## 2022-09-24 ENCOUNTER — Inpatient Hospital Stay
Admission: RE | Admit: 2022-09-24 | Discharge: 2022-09-24 | Disposition: A | Discharge status: Home / Self Care | Payer: Medicare Other | Source: Ambulatory Visit | Attending: NURSE PRACTITIONER | Admitting: NURSE PRACTITIONER

## 2022-09-24 DIAGNOSIS — Z853 Personal history of malignant neoplasm of breast: Secondary | ICD-10-CM | POA: Insufficient documentation

## 2022-09-24 DIAGNOSIS — C50919 Malignant neoplasm of unspecified site of unspecified female breast: Secondary | ICD-10-CM | POA: Insufficient documentation

## 2022-09-24 DIAGNOSIS — Z08 Encounter for follow-up examination after completed treatment for malignant neoplasm: Secondary | ICD-10-CM | POA: Insufficient documentation

## 2022-09-24 MED ORDER — SODIUM CHLORIDE 0.9 % INJECTION SOLUTION
2.0000 mL | Freq: Once | INTRAVENOUS | Status: DC | PRN
Start: 2022-09-24 — End: 2022-09-25
  Administered 2022-09-24: 4 mL via INTRAVENOUS

## 2022-09-25 ENCOUNTER — Other Ambulatory Visit (HOSPITAL_COMMUNITY): Payer: Self-pay | Admitting: HEMATOLOGY-ONCOLOGY

## 2022-09-26 ENCOUNTER — Encounter (HOSPITAL_COMMUNITY): Payer: Self-pay | Admitting: Surgery

## 2022-09-26 ENCOUNTER — Ambulatory Visit (HOSPITAL_COMMUNITY): Payer: Medicare Other | Admitting: Anesthesiology

## 2022-09-26 ENCOUNTER — Encounter (HOSPITAL_COMMUNITY): Payer: Medicare Other | Admitting: Surgery

## 2022-09-26 ENCOUNTER — Encounter (HOSPITAL_COMMUNITY)
Admission: RE | Disposition: A | Discharge status: Home / Self Care | Payer: Self-pay | Source: Ambulatory Visit | Attending: Surgery

## 2022-09-26 ENCOUNTER — Inpatient Hospital Stay
Admission: RE | Admit: 2022-09-26 | Discharge: 2022-09-26 | Disposition: A | Discharge status: Home / Self Care | Payer: Medicare Other | Source: Ambulatory Visit | Attending: Surgery | Admitting: Surgery

## 2022-09-26 ENCOUNTER — Other Ambulatory Visit (HOSPITAL_COMMUNITY): Payer: Medicare Other

## 2022-09-26 ENCOUNTER — Other Ambulatory Visit: Payer: Self-pay

## 2022-09-26 DIAGNOSIS — C773 Secondary and unspecified malignant neoplasm of axilla and upper limb lymph nodes: Secondary | ICD-10-CM | POA: Insufficient documentation

## 2022-09-26 DIAGNOSIS — Z85118 Personal history of other malignant neoplasm of bronchus and lung: Secondary | ICD-10-CM | POA: Insufficient documentation

## 2022-09-26 DIAGNOSIS — Z7984 Long term (current) use of oral hypoglycemic drugs: Secondary | ICD-10-CM | POA: Insufficient documentation

## 2022-09-26 DIAGNOSIS — Z7901 Long term (current) use of anticoagulants: Secondary | ICD-10-CM | POA: Insufficient documentation

## 2022-09-26 DIAGNOSIS — I10 Essential (primary) hypertension: Secondary | ICD-10-CM | POA: Insufficient documentation

## 2022-09-26 DIAGNOSIS — K219 Gastro-esophageal reflux disease without esophagitis: Secondary | ICD-10-CM | POA: Insufficient documentation

## 2022-09-26 DIAGNOSIS — Z86718 Personal history of other venous thrombosis and embolism: Secondary | ICD-10-CM | POA: Insufficient documentation

## 2022-09-26 DIAGNOSIS — Z8542 Personal history of malignant neoplasm of other parts of uterus: Secondary | ICD-10-CM | POA: Insufficient documentation

## 2022-09-26 DIAGNOSIS — E119 Type 2 diabetes mellitus without complications: Secondary | ICD-10-CM | POA: Insufficient documentation

## 2022-09-26 SURGERY — INSERTION PORT SUBCUTANEOUS
Anesthesia: Monitor Anesthesia Care | Site: Chest | Wound class: Clean Wound: Uninfected operative wounds in which no inflammation occurred

## 2022-09-26 MED ORDER — LACTATED RINGERS INTRAVENOUS SOLUTION
INTRAVENOUS | Status: DC
Start: 2022-09-26 — End: 2022-09-26

## 2022-09-26 MED ORDER — FENTANYL (PF) 50 MCG/ML INJECTION WRAPPER
25.0000 ug | INJECTION | INTRAMUSCULAR | Status: DC | PRN
Start: 2022-09-26 — End: 2022-09-26

## 2022-09-26 MED ORDER — HYDROMORPHONE 2 MG/ML INJECTION WRAPPER
0.2000 mg | INJECTION | INTRAMUSCULAR | Status: DC | PRN
Start: 2022-09-26 — End: 2022-09-26

## 2022-09-26 MED ORDER — METOCLOPRAMIDE 5 MG/ML INJECTION SOLUTION
10.0000 mg | Freq: Four times a day (QID) | INTRAMUSCULAR | Status: DC | PRN
Start: 2022-09-26 — End: 2022-09-26

## 2022-09-26 MED ORDER — CEFAZOLIN 2 GRAM INTRAVENOUS SOLUTION
INTRAVENOUS | Status: AC
Start: 2022-09-26 — End: 2022-09-26
  Filled 2022-09-26: qty 14.71

## 2022-09-26 MED ORDER — CEFAZOLIN 1 GRAM SOLUTION FOR INJECTION
Freq: Once | INTRAMUSCULAR | Status: DC | PRN
Start: 2022-09-26 — End: 2022-09-26
  Administered 2022-09-26: 2000 mg via INTRAVENOUS

## 2022-09-26 MED ORDER — FENTANYL (PF) 50 MCG/ML INJECTION SOLUTION
INTRAMUSCULAR | Status: AC
Start: 2022-09-26 — End: 2022-09-26
  Filled 2022-09-26: qty 2

## 2022-09-26 MED ORDER — ROPIVACAINE (PF) 2 MG/ML (0.2 %) INJECTION SOLUTION
INTRAMUSCULAR | Status: AC
Start: 2022-09-26 — End: 2022-09-26
  Filled 2022-09-26: qty 20

## 2022-09-26 MED ORDER — FAMOTIDINE (PF) 20 MG/2 ML INTRAVENOUS SOLUTION
INTRAVENOUS | Status: AC
Start: 2022-09-26 — End: 2022-09-26
  Filled 2022-09-26: qty 2

## 2022-09-26 MED ORDER — KETOROLAC 30 MG/ML (1 ML) INJECTION SOLUTION
15.0000 mg | Freq: Four times a day (QID) | INTRAMUSCULAR | Status: DC | PRN
Start: 2022-09-26 — End: 2022-09-26

## 2022-09-26 MED ORDER — PROCHLORPERAZINE EDISYLATE 10 MG/2 ML (5 MG/ML) INJECTION SOLUTION
5.0000 mg | Freq: Once | INTRAMUSCULAR | Status: DC | PRN
Start: 2022-09-26 — End: 2022-09-26

## 2022-09-26 MED ORDER — PROPOFOL 10 MG/ML IV BOLUS
INJECTION | Freq: Once | INTRAVENOUS | Status: DC | PRN
Start: 2022-09-26 — End: 2022-09-26
  Administered 2022-09-26 (×8): 20 mg via INTRAVENOUS

## 2022-09-26 MED ORDER — ACETAMINOPHEN 325 MG TABLET
650.0000 mg | ORAL_TABLET | ORAL | Status: DC | PRN
Start: 2022-09-26 — End: 2022-09-26

## 2022-09-26 MED ORDER — ONDANSETRON 4 MG DISINTEGRATING TABLET
4.0000 mg | ORAL_TABLET | Freq: Four times a day (QID) | ORAL | Status: DC | PRN
Start: 2022-09-26 — End: 2022-09-26

## 2022-09-26 MED ORDER — EPHEDRINE SULFATE 50 MG/ML INTRAVENOUS SOLUTION
Freq: Once | INTRAVENOUS | Status: DC | PRN
Start: 2022-09-26 — End: 2022-09-26
  Administered 2022-09-26 (×2): 10 mg via INTRAVENOUS

## 2022-09-26 MED ORDER — SODIUM CHLORIDE 0.9 % (FLUSH) INJECTION SYRINGE
3.0000 mL | INJECTION | Freq: Three times a day (TID) | INTRAMUSCULAR | Status: DC
Start: 2022-09-26 — End: 2022-09-26

## 2022-09-26 MED ORDER — FAMOTIDINE (PF) 20 MG/2 ML INTRAVENOUS SOLUTION
20.0000 mg | Freq: Once | INTRAVENOUS | Status: AC
Start: 2022-09-26 — End: 2022-09-26
  Administered 2022-09-26: 20 mg via INTRAVENOUS

## 2022-09-26 MED ORDER — HEPARIN (PORCINE) 5,000 UNIT/ML INJECTION SOLUTION
Freq: Once | INTRAMUSCULAR | Status: DC | PRN
Start: 2022-09-26 — End: 2022-09-26
  Administered 2022-09-26: 5000 [IU]

## 2022-09-26 MED ORDER — ONDANSETRON HCL (PF) 4 MG/2 ML INJECTION SOLUTION
4.0000 mg | Freq: Once | INTRAMUSCULAR | Status: AC
Start: 2022-09-26 — End: 2022-09-26
  Administered 2022-09-26: 4 mg via INTRAVENOUS

## 2022-09-26 MED ORDER — DEXAMETHASONE SODIUM PHOSPHATE 4 MG/ML INJECTION SOLUTION
INTRAMUSCULAR | Status: AC
Start: 2022-09-26 — End: 2022-09-26
  Filled 2022-09-26: qty 1

## 2022-09-26 MED ORDER — LACTATED RINGERS INTRAVENOUS SOLUTION
INTRAVENOUS | Status: DC | PRN
Start: 2022-09-26 — End: 2022-09-26
  Administered 2022-09-26: 0 via INTRAVENOUS

## 2022-09-26 MED ORDER — HEPARIN (PORCINE) 5,000 UNIT/ML INJECTION SOLUTION
INTRAMUSCULAR | Status: AC
Start: 2022-09-26 — End: 2022-09-26
  Filled 2022-09-26: qty 1

## 2022-09-26 MED ORDER — ONDANSETRON HCL (PF) 4 MG/2 ML INJECTION SOLUTION
4.0000 mg | Freq: Four times a day (QID) | INTRAMUSCULAR | Status: DC | PRN
Start: 2022-09-26 — End: 2022-09-26

## 2022-09-26 MED ORDER — ONDANSETRON HCL (PF) 4 MG/2 ML INJECTION SOLUTION
INTRAMUSCULAR | Status: AC
Start: 2022-09-26 — End: 2022-09-26
  Filled 2022-09-26: qty 2

## 2022-09-26 MED ORDER — HYDROCODONE 5 MG-ACETAMINOPHEN 325 MG TABLET
1.0000 | ORAL_TABLET | ORAL | Status: DC | PRN
Start: 2022-09-26 — End: 2022-09-26

## 2022-09-26 MED ORDER — FENTANYL (PF) 50 MCG/ML INJECTION WRAPPER
INJECTION | Freq: Once | INTRAMUSCULAR | Status: DC | PRN
Start: 2022-09-26 — End: 2022-09-26
  Administered 2022-09-26 (×2): 25 ug via INTRAVENOUS
  Administered 2022-09-26: 50 ug via INTRAVENOUS

## 2022-09-26 MED ORDER — SODIUM CHLORIDE 0.9 % (FLUSH) INJECTION SYRINGE
3.0000 mL | INJECTION | INTRAMUSCULAR | Status: DC | PRN
Start: 2022-09-26 — End: 2022-09-26

## 2022-09-26 MED ORDER — IPRATROPIUM 0.5 MG-ALBUTEROL 3 MG (2.5 MG BASE)/3 ML NEBULIZATION SOLN
3.0000 mL | INHALATION_SOLUTION | Freq: Once | RESPIRATORY_TRACT | Status: DC | PRN
Start: 2022-09-26 — End: 2022-09-26

## 2022-09-26 MED ORDER — ETHYL ALCOHOL 62 % (NOZIN NASAL SANITIZER) NASAL SOLUTION - BULK BOTTLE
1.0000 | Freq: Two times a day (BID) | NASAL | Status: DC
Start: 2022-09-26 — End: 2022-09-26
  Administered 2022-09-26: 1 via NASAL

## 2022-09-26 MED ORDER — FENTANYL (PF) 50 MCG/ML INJECTION WRAPPER
50.0000 ug | INJECTION | INTRAMUSCULAR | Status: DC | PRN
Start: 2022-09-26 — End: 2022-09-26

## 2022-09-26 MED ORDER — ROPIVACAINE (PF) 2 MG/ML (0.2 %) INJECTION SOLUTION
Freq: Once | INTRAMUSCULAR | Status: DC | PRN
Start: 2022-09-26 — End: 2022-09-26
  Administered 2022-09-26: 18 mL via INTRAMUSCULAR

## 2022-09-26 MED ORDER — TRAMADOL 50 MG TABLET
1.0000 | ORAL_TABLET | ORAL | 1 refills | Status: AC | PRN
Start: 2022-09-26 — End: ?

## 2022-09-26 MED ORDER — ONDANSETRON HCL (PF) 4 MG/2 ML INJECTION SOLUTION
4.0000 mg | Freq: Once | INTRAMUSCULAR | Status: DC | PRN
Start: 2022-09-26 — End: 2022-09-26

## 2022-09-26 MED ORDER — DEXAMETHASONE SODIUM PHOSPHATE 4 MG/ML INJECTION SOLUTION
4.0000 mg | Freq: Once | INTRAMUSCULAR | Status: AC
Start: 2022-09-26 — End: 2022-09-26
  Administered 2022-09-26: 4 mg via INTRAVENOUS

## 2022-09-26 MED ORDER — ALBUTEROL SULFATE 2.5 MG/3 ML (0.083 %) SOLUTION FOR NEBULIZATION
2.5000 mg | INHALATION_SOLUTION | Freq: Once | RESPIRATORY_TRACT | Status: DC | PRN
Start: 2022-09-26 — End: 2022-09-26

## 2022-09-26 MED ORDER — SODIUM CHLORIDE 0.9 % INTRAVENOUS PIGGYBACK
INJECTION | INTRAVENOUS | Status: AC
Start: 2022-09-26 — End: 2022-09-26
  Filled 2022-09-26: qty 50

## 2022-09-26 SURGICAL SUPPLY — 28 items
ADH LIQUID LF  WTPRF VIAL PREP NONSTAIN MASTISOL STYRAX GUM MASTIC ALC MTHY SLCYT STRL CLR NHZR 2/3 (WOUND CARE SUPPLY) ×1 IMPLANT
BLADE 15 2 END CBNSTL SURG STRL DISP (SURGICAL CUTTING SUPPLIES) ×1 IMPLANT
COUNTER 20 CNT BLOCK ADH NEEDLE STRL LF  RD SHARP FOAM 15.75X11.5X14IN DISP (MED SURG SUPPLIES) ×1 IMPLANT
COVER 53X24IN MAYOSTAND PRXM STRL DISP EQP SMS LF (DRAPE/PACKS/SHEETS/OR TOWEL) ×1 IMPLANT
COVER TBL 90X50IN STD SMS REINF FNFLD STRL LF  DISP (DRAPE/PACKS/SHEETS/OR TOWEL) ×1 IMPLANT
DCNTR FLUID DISPENSR BAG BAJ DISP STRL LF  ASPT TRANSF (IV TUBING & ACCESSORIES) ×1 IMPLANT
DETERGENT INSTR 22OZ TRNSPT GEL RINSE FREE NEUT PH PREKLENZ CLR PLSNT LF (MISCELLANEOUS PT CARE ITEMS) ×1 IMPLANT
DRAPE ABS FENESTRATE ADH 121X102X77IN ABDOMINAL 12IN 13IN PRXM LF  STRL DISP SURG SMS 20X36IN (DRAPE/PACKS/SHEETS/OR TOWEL) ×1 IMPLANT
DRESS TRNSPR 4.75X4.75IN CHG G_EL PAD NTCH STRP TGDRM 2 7/16 (WOUND CARE SUPPLY) ×1 IMPLANT
DSCT 21CM LIGASURE EXACT 40D 20.6MM 19.8MM 4MM CURVE JAW LOW TEMP PROF SURG 21.6X2MM VLAB (SURGICAL CUTTING SUPPLIES) ×1 IMPLANT
GLOVE SURG 7 LF  PF BEAD CUF STRL CRM 11.8IN PROTEXIS PI PLISPRN THK9.1 MIL (GLOVES AND ACCESSORIES) ×2 IMPLANT
GLOVE SURG 7.5 LF  PF BEAD CUF STRL CRM 11.8IN PROTEXIS PI PLISPRN THK9.1 MIL (GLOVES AND ACCESSORIES) ×1 IMPLANT
GLOVE SURG 7.5 LF  PF SMOOTH BEAD CUF INTLK STRL BLU 11.8IN PROTEXIS NEU-THERA PLISPRN THK7.9 MIL (GLOVES AND ACCESSORIES) ×1 IMPLANT
GLOVE SURG 7.5 LTX PF SMOOTH BEAD CUF STRL YW 12IN PROTEXIS (GLOVES AND ACCESSORIES) ×1 IMPLANT
GOWN SURG 2XL XLNG LGTH L3 HKLP CLSR RGLN SLEEVE TWL STRL LF  DISP GRN AERO BLU PRFRM FBRC (DRAPE/PACKS/SHEETS/OR TOWEL) ×1 IMPLANT
GOWN SURG LRG STD LGTH L3 HKLP CLSR RGLN SLEEVE TWL STRL LF  DISP GRN AERO BLU PRFRM FBRC (DRAPE/PACKS/SHEETS/OR TOWEL) ×2 IMPLANT
LABEL MED CORRECT MED LABELING SYS 4 FLG 2 SHEET 24 PRPRNT STRL (MED SURG SUPPLIES) ×1 IMPLANT
NEEDLE HYPO  23GA 1.5IN TW PRCSNGL SS POLYPROP REG BVL LL HUB DEHP-FR TRQS STRL LF  DISP (MED SURG SUPPLIES) ×2 IMPLANT
PACK SURG UBR BCK TBL CVR ZN REINF MAYO STAND CVR STRL DISP 90X44IN 54X23IN LF (CUSTOM TRAYS & PACK) IMPLANT
PORT IMPL INFUS POWERPORT CLRVU ARGD SIL POLYUR 8FR 1 LUM LTWT INTMD STRL LF ×1 IMPLANT
SLEEVE COMPRESS MED KNEE LGTH KENDALL SCD SEQ NONST LF  DISP 21- IN DVT PE (MED SURG SUPPLIES) ×1 IMPLANT
SOL IV 0.9% NACL 500ML PLASTIC CONTAINR VIAFLEX LF (MEDICATIONS/SOLUTIONS) ×1 IMPLANT
SPONGE SURG 4X4IN 16 PLY XRY DETECT COTTON STRL LF  DISP (WOUND CARE SUPPLY) ×1 IMPLANT
STRIP 4X.5IN STRSTRP PLSTR REINF SKNCLS WHT STRL LF (WOUND CARE SUPPLY) ×1 IMPLANT
SUTURE 2-0 SH VICRYL 27IN VIOL BRD COAT ABS (SUTURE/WOUND CLOSURE) ×1 IMPLANT
SUTURE 5-0 C-13 POLYSRB 30IN UNDYED BRD COAT ABS (SUTURE/WOUND CLOSURE) ×1 IMPLANT
SYRINGE LL 10ML LF  STRL GRAD N-PYRG DEHP-FR PVC FREE MED DISP (MED SURG SUPPLIES) ×2 IMPLANT
TOWEL 24X16IN COTTON BLU DISP SURG STRL LF (DRAPE/PACKS/SHEETS/OR TOWEL) ×2 IMPLANT

## 2022-09-26 NOTE — Anesthesia Preprocedure Evaluation (Signed)
ANESTHESIA PRE-OP EVALUATION  Planned Procedure: INSERTION OF PORTACATH (Chest)  Review of Systems     anesthesia history negative     patient summary reviewed  nursing notes reviewed        Pulmonary  negative pulmonary ROS,    Cardiovascular    Hypertension, well controlled, ECG reviewed and DVT ,No peripheral edema,  Exercise Tolerance: > or = 4 METS        GI/Hepatic/Renal    GERD and well controlled        Endo/Other      type 2 diabetes/ controlled with oral medications    Neuro/Psych/MS   negative neuro/psych ROS,      Cancer    lung cancer,               Physical Assessment      Airway       Mallampati: III    TM distance: >3 FB    Neck ROM: full  Mouth Opening: good.            Dental           (+) upper dentures           Pulmonary    Breath sounds clear to auscultation  (-) no rhonchi, no decreased breath sounds, no wheezes, no rales and no stridor     Cardiovascular    Rhythm: regular  Rate: Normal  (-) no friction rub, carotid bruit is not present, no peripheral edema and no murmur     Other findings          Plan  ASA 3     Planned anesthesia type: MAC           PONV Plan:  I plan to administer pharmcologic prophalaxis antiemetics                  Anesthesia issues/risks discussed are: Stroke, Aspiration, Cardiac Events/MI and Intraoperative Awareness/ Recall.  Anesthetic plan and risks discussed with patient  signed consent obtained          Patient's NPO status is appropriate for Anesthesia.           Plan discussed with CRNA.

## 2022-09-26 NOTE — OR Surgeon (Signed)
[facility]      Patient Name: Kelsey Preston, Kelsey Preston Number: [id number]  Date of Service: 09/26/2022   Date of Birth: [birth year]      Pre-Operative Diagnosis: BREAST CANCER     Post-Operative Diagnosis: BREAST CANCER    Procedure(s)/Description:  INSERTION OF PORTACATH: 54098 (CPT)     Attending Surgeon: Fidela Juneau, MD     Anesthesia:  CRNA: Leigh Aurora, CRNA    Anesthesia Type: .Monitor Anesthesia Care     Estimated Blood Loss:  Minimal    The patient was brought in to the operating room, placed on the table in the supine position. The chest was then prepped and draped in a sterile manner and the infraclavicular region was infiltrated with local anesthetic. Then after adequate anesthesia the subclavian vein was entered and via the modified Seldinger technique the guide wire was inserted and confirmed to be in good position under fluoroscopy. The chest was then infiltrated with local anesthetic and a subcutaneous pocket was made. A curved tunneling device was used to make a subcutaneous tunnel, connecting the subcutaneous pocket with the guide wire insertion site. The catheter was then passed through the subcutaneous tunnel and then the introducer and sheath were passed over the guide wire and then the guide wire was removed, as well as the introducer, leaving the sheath in place. The tip of the catheter was inserted into the sheath, into the superior vena cava and then under fluoroscopic confirmation good position of the tip of the catheter was identified. The catheter was then cut, such that its length would approximate the level of the subcutaneous pocket. It was then attached to the subcutaneous port and sutured to the underlying fascia with 2-0 Vicryl sutures. Hemostasis was well obtained. The catheter was aspirated with good blood return and the catheter was flushed. The subcutaneous pocket was then closed with a running 5-0 Vicryl suture in a buried subcuticular fashion. The superior  incision at the guide wire entry site was closed with a single interrupted 5-0 Vicryl suture in a buried subcuticular fashion. The area was clean and dry and sterile dressing was applied over the incision. Patient tolerated the procedure well. No complications encountered    Umar Patmon B. Keneth Borg, MD, MBA, FACS  [facility] -General Surgery

## 2022-09-26 NOTE — Anesthesia Postprocedure Evaluation (Signed)
Anesthesia Post Op Evaluation    Patient: Kelsey Preston  Procedure(s):  INSERTION OF PORTACATH    Last Vitals:Temperature: 36.4 C (97.6 F) (09/26/22 1026)  Heart Rate: 93 (09/26/22 1038)  BP (Non-Invasive): 128/64 (09/26/22 1038)  Respiratory Rate: 20 (09/26/22 1038)  SpO2: 98 % (09/26/22 1038)    No notable events documented.    Patient is sufficiently recovered from the effects of anesthesia to participate in the evaluation and has returned to their pre-procedure level.  Patient location during evaluation: PACU       Patient participation: complete - patient participated  Level of consciousness: awake and alert and responsive to verbal stimuli    Pain management: adequate  Airway patency: patent    Anesthetic complications: no  Cardiovascular status: acceptable  Respiratory status: acceptable  Hydration status: acceptable  Patient post-procedure temperature: Pt Normothermic   PONV Status: Absent

## 2022-09-26 NOTE — H&P (Signed)
[facility]  General Surgery  History and Physical    Date of Service:  09/26/2022  Kelsey Preston, 82 y.o. female  Date of Admission:  09/26/2022  Date of Birth:  1940/07/22  PCP: Charm Rings, MD    Reason for admission:  Port-A-Cath insertion    HPI:  Kelsey Preston is a 82 y.o. White female who is admitted for Port-A-Cath insertion for chemotherapy for metastatic cancer after a left axillary lymph node was biopsy to be consistent with cancer.    Past Medical History:   Diagnosis Date    Diabetes mellitus, type 2 (CMS HCC)     Embolism (CMS HCC)     Endometrial cancer (CMS HCC)     HTN (hypertension)     Lung cancer (CMS HCC)     Macular degeneration (senile) of retina       Past Surgical History:   Procedure Laterality Date    HX CHOLECYSTECTOMY      HX COLONOSCOPY      HX HYSTERECTOMY      HX LOBECTOMY      LUNG CANCER SURGERY        Social History     Tobacco Use    Smoking status: Never    Smokeless tobacco: Never   Vaping Use    Vaping status: Never Used   Substance Use Topics    Alcohol use: Never    Drug use: Never       Family Medical History:       Problem Relation (Age of Onset)    Cancer Brother           Medications Prior to Admission       Prescriptions    atorvastatin (LIPITOR) 40 mg Oral Tablet    Take 1 Tablet (40 mg total) by mouth Every evening for 30 days    esomeprazole magnesium (NEXIUM) 40 mg Oral Capsule, Delayed Release(E.C.)    Take 1 Capsule (40 mg total) by mouth Every morning before breakfast    ezetimibe (ZETIA) 10 mg Oral Tablet    Take 1 Tablet (10 mg total) by mouth Every evening    famotidine (PEPCID) 40 mg Oral Tablet    Take 1 Tablet (40 mg total) by mouth Once a day    hydroCHLOROthiazide (HYDRODIURIL) 25 mg Oral Tablet    Take 1 Tablet (25 mg total) by mouth Once a day    metFORMIN (GLUCOPHAGE XR) 500 mg Oral Tablet Sustained Release 24 hr    Take 1 Tablet (500 mg total) by mouth Twice daily    metFORMIN (GLUCOPHAGE) 500 mg Oral Tablet    Take 1 Tablet (500 mg  total) by mouth Twice daily with food    multivitamin with iron Oral Tablet    Take 1 Tablet by mouth Once a day    ondansetron (ZOFRAN ODT) 4 mg Oral Tablet, Rapid Dissolve    Take 1 Tablet (4 mg total) by mouth Every 8 hours as needed for Nausea/Vomiting Indications: prevent nausea and vomiting from cancer chemotherapy    Patient not taking:  Reported on 09/26/2022    rivaroxaban (XARELTO) 20 mg Oral Tablet    Take 1 Tablet (20 mg total) by mouth Every evening with dinner for 120 days    sertraline (ZOLOFT) 50 mg Oral Tablet    Take 1 Tablet (50 mg total) by mouth Once a day    telmisartan (MICARDIS) 80 mg Oral Tablet    Take 1 Tablet (80 mg  total) by mouth Once a day    traMADoL (ULTRAM) 50 mg Oral Tablet    Take 1 Tablet (50 mg total) by mouth Every 4 hours as needed for Pain    Patient not taking:  Reported on 09/26/2022           Allergies   Allergen Reactions    Adhesive Rash    Ceclor [Cefaclor] Rash          Patient Vitals for the past 24 hrs:   BP Temp Pulse Resp SpO2 Height Weight   09/26/22 0753 107/71 36.1 C (97 F) 81 18 96 % 1.626 m (5\' 4" ) 96.2 kg (212 lb)          General: appropriate for age. in no acute distress.    Vital signs are present above and have been reviewed by me     HEENT: Atraumatic, Normocephalic. PERRLA, EOMI. Nose clear. Throat clear.    Lungs: Nonlabored breathing with symmetric expansion.  Clear to auscultation bilaterally    Heart:Regular wth respect to rate and rythmn.    Abdomen:Soft. Nontender. Nondistended and benign    Extremities:  Grossly normal with good range of motion and no major deformities.    Neuro:  Grossly normal motor and sensory function. CN's II through XII intact.    Psychiatric: Alert and oriented to person, place, and time. affect appropriate    Laboratory Data:     No results found for any visits on 09/26/22 (from the past 24 hour(s)).    Imaging Studies:    FLUORO C ARM IN OR < 60 MINS    (Results Pending)        Assessment/Plan:  Poor IV access in need of  chemotherapy for metastatic cancer    Port-A-Cath insertion, right side scheduled for 09/26/2022    Risks, benefits, indications, complication of portacath/mediport insertion were discussed including but not limited to bleeding, infection, pneumothorax, arterial injury, cardiac arrhythmia, catheter related infectious process, procedural discomfort, air embolus, and even procedural related death.  All questions answered with informed signed consent obtained.    This note was partially created using voice recognition software and is inherently subject to errors including those of syntax and "sound alike " substitutions which may escape proof reading. In such instances, original meaning may be extrapolated by contextual derivation.    Fidela Juneau, MD, MBA, FACS

## 2022-09-26 NOTE — Discharge Instructions (Addendum)
Follow up in office as needed.     Call the office with any questions or concerns that you may have.     Follow any instructions given to you by your doctor.    Leave your dressing in place. Nursing staff will remove dressing at your first chemo appointment.     Steri strips fall off on their own in approximately 2 weeks or may be removed by nursing staff.     Resume home medications as instructed.     Stop by your pharmacy and pick up your prescription.

## 2022-09-26 NOTE — Anesthesia Transfer of Care (Signed)
ANESTHESIA TRANSFER OF CARE   Kelsey Preston is a 82 y.o. ,female, Weight: 96.2 kg (212 lb)   had Procedure(s):  INSERTION OF PORTACATH  performed  09/26/22   Primary Service: Fidela Juneau, MD    Past Medical History:   Diagnosis Date   . Diabetes mellitus, type 2 (CMS HCC)    . Embolism (CMS HCC)    . Endometrial cancer (CMS HCC)    . HTN (hypertension)    . Lung cancer (CMS HCC)    . Macular degeneration (senile) of retina       Allergy History as of 09/26/22       CIPROFLOXACIN         Noted Status Severity Type Reaction    09/26/22 0752 Darlyne Russian, LPN 21/30/86 Deleted Low  Hives/ Urticaria    06/29/21 1207 Terrance Mass, RN 06/29/21 Active Low  Hives/ Urticaria              ADHESIVE         Noted Status Severity Type Reaction    08/06/22 0656 Thea Alken, RN 08/06/22 Active Medium  Rash              CEFACLOR         Noted Status Severity Type Reaction    09/26/22 0753 Darlyne Russian, LPN 57/84/69 Active Medium  Rash                  I completed my transfer of care / handoff to the receiving personnel during which we discussed:  Access, Airway, All key/critical aspects of case discussed, Analgesia, Antibiotics, Expectation of post procedure, Fluids/Product, Gave opportunity for questions and acknowledgement of understanding, Labs and PMHx      Post Location: PACU                                                           Last OR Temp: Temperature: 36.1 C (97 F)  ABG:  POTASSIUM   Date Value Ref Range Status   08/29/2022 4.2 3.5 - 5.1 mmol/L Final     KETONES   Date Value Ref Range Status   05/29/2022 Negative Negative mg/dL Final     CALCIUM   Date Value Ref Range Status   08/29/2022 10.1 8.6 - 10.3 mg/dL Final     Calculated P Axis   Date Value Ref Range Status   05/29/2022 100 degrees Final     Calculated R Axis   Date Value Ref Range Status   05/29/2022 106 degrees Final     Calculated T Axis   Date Value Ref Range Status   05/29/2022 -6 degrees Final     CARIS ANDROGEN RECEPTOR   Date Value Ref  Range Status   08/08/2022 Positive  Final     CARIS Estrogen Receptor   Date Value Ref Range Status   08/08/2022 Negative  Final     CARIS Genomic Loss of Heterozygosity - Exome   Date Value Ref Range Status   08/08/2022 High 16%  Final     CARIS Her2/Neu   Date Value Ref Range Status   08/08/2022 Positive  Final     CARIS Microsatellite Instability - Exome   Date Value Ref Range Status   08/08/2022 Stable  Final     CARIS  PD-L1 [phone])   Date Value Ref Range Status   08/08/2022 Positive  Final     CARIS Progesterone Receptor   Date Value Ref Range Status   08/08/2022 Negative  Final     CARIS PTEN   Date Value Ref Range Status   08/08/2022 Positive  Final     CARIS Tumor Mutational Burden - Exome   Date Value Ref Range Status   08/08/2022 Low 2 per Mb  Final     Airway:* No LDAs found *  Blood pressure 107/71, pulse 81, temperature 36.1 C (97 F), resp. rate 18, height 1.626 m (5\' 4" ), weight 96.2 kg (212 lb), SpO2 96%.

## 2022-09-27 ENCOUNTER — Inpatient Hospital Stay (INDEPENDENT_AMBULATORY_CARE_PROVIDER_SITE_OTHER)
Admission: RE | Admit: 2022-09-27 | Discharge: 2022-09-27 | Disposition: A | Discharge status: Home / Self Care | Payer: Medicare Other | Source: Ambulatory Visit | Attending: HEMATOLOGY-ONCOLOGY | Admitting: HEMATOLOGY-ONCOLOGY

## 2022-09-27 ENCOUNTER — Other Ambulatory Visit (INDEPENDENT_AMBULATORY_CARE_PROVIDER_SITE_OTHER): Payer: Self-pay | Admitting: HEMATOLOGY-ONCOLOGY

## 2022-09-27 ENCOUNTER — Encounter (HOSPITAL_COMMUNITY): Payer: Self-pay

## 2022-09-27 ENCOUNTER — Encounter (INDEPENDENT_AMBULATORY_CARE_PROVIDER_SITE_OTHER): Payer: Self-pay | Admitting: HEMATOLOGY-ONCOLOGY

## 2022-09-27 ENCOUNTER — Ambulatory Visit
Admission: RE | Admit: 2022-09-27 | Discharge: 2022-09-27 | Disposition: A | Discharge status: Home / Self Care | Payer: Medicare Other | Source: Ambulatory Visit | Attending: HEMATOLOGY-ONCOLOGY | Admitting: HEMATOLOGY-ONCOLOGY

## 2022-09-27 ENCOUNTER — Other Ambulatory Visit: Payer: Self-pay

## 2022-09-27 ENCOUNTER — Ambulatory Visit (INDEPENDENT_AMBULATORY_CARE_PROVIDER_SITE_OTHER): Payer: Medicare Other | Admitting: HEMATOLOGY-ONCOLOGY

## 2022-09-27 VITALS — BP 128/67 | HR 80 | Temp 98.2°F | Resp 16

## 2022-09-27 VITALS — BP 131/62 | HR 82 | Temp 97.3°F | Ht 64.0 in | Wt 215.6 lb

## 2022-09-27 DIAGNOSIS — I82412 Acute embolism and thrombosis of left femoral vein: Secondary | ICD-10-CM

## 2022-09-27 DIAGNOSIS — Z85118 Personal history of other malignant neoplasm of bronchus and lung: Secondary | ICD-10-CM | POA: Insufficient documentation

## 2022-09-27 DIAGNOSIS — Z5112 Encounter for antineoplastic immunotherapy: Secondary | ICD-10-CM | POA: Insufficient documentation

## 2022-09-27 DIAGNOSIS — R5383 Other fatigue: Secondary | ICD-10-CM | POA: Insufficient documentation

## 2022-09-27 DIAGNOSIS — Z902 Acquired absence of lung [part of]: Secondary | ICD-10-CM | POA: Insufficient documentation

## 2022-09-27 DIAGNOSIS — Z5111 Encounter for antineoplastic chemotherapy: Secondary | ICD-10-CM | POA: Insufficient documentation

## 2022-09-27 DIAGNOSIS — Z8542 Personal history of malignant neoplasm of other parts of uterus: Secondary | ICD-10-CM | POA: Insufficient documentation

## 2022-09-27 DIAGNOSIS — C50919 Malignant neoplasm of unspecified site of unspecified female breast: Secondary | ICD-10-CM | POA: Insufficient documentation

## 2022-09-27 LAB — COMPREHENSIVE METABOLIC PANEL, NON-FASTING
ALBUMIN/GLOBULIN RATIO: 1.3 (ref 0.8–1.4)
ALBUMIN: 4.1 g/dL (ref 3.5–5.7)
ALKALINE PHOSPHATASE: 116 U/L — ABNORMAL HIGH (ref 34–104)
ALT (SGPT): 16 U/L (ref 7–52)
ANION GAP: 6 mmol/L (ref 4–13)
AST (SGOT): 19 U/L (ref 13–39)
BILIRUBIN TOTAL: 0.4 mg/dL (ref 0.3–1.2)
BUN/CREA RATIO: 19 (ref 6–22)
BUN: 25 mg/dL (ref 7–25)
CALCIUM, CORRECTED: 10.6 mg/dL (ref 8.9–10.8)
CALCIUM: 10.7 mg/dL — ABNORMAL HIGH (ref 8.6–10.3)
CHLORIDE: 100 mmol/L (ref 98–107)
CO2 TOTAL: 31 mmol/L (ref 21–31)
CREATININE: 1.33 mg/dL — ABNORMAL HIGH (ref 0.60–1.30)
ESTIMATED GFR: 41 mL/min/{1.73_m2} — ABNORMAL LOW (ref >59)
GLOBULIN: 3.1 (ref 2.9–5.4)
GLUCOSE: 151 mg/dL — ABNORMAL HIGH (ref 74–109)
OSMOLALITY, CALCULATED: 281 mOsm/kg (ref 270–290)
POTASSIUM: 4.9 mmol/L (ref 3.5–5.1)
PROTEIN TOTAL: 7.2 g/dL (ref 6.4–8.9)
SODIUM: 137 mmol/L (ref 136–145)

## 2022-09-27 LAB — CBC WITH DIFF
BASOPHIL #: 0 10*3/uL (ref 0.00–0.10)
BASOPHIL %: 1 % (ref 0–1)
EOSINOPHIL #: 0.1 10*3/uL (ref 0.00–0.50)
EOSINOPHIL %: 1 %
HCT: 34.3 % (ref 31.2–41.9)
HGB: 11.5 g/dL (ref 10.9–14.3)
LYMPHOCYTE #: 2.1 10*3/uL (ref 1.00–3.00)
LYMPHOCYTE %: 31 % (ref 16–44)
MCH: 29.7 pg (ref 24.7–32.8)
MCHC: 33.5 g/dL (ref 32.3–35.6)
MCV: 88.7 fL (ref 75.5–95.3)
MONOCYTE #: 0.5 10*3/uL (ref 0.30–1.00)
MONOCYTE %: 7 % (ref 5–13)
MPV: 9.6 fL (ref 7.9–10.8)
NEUTROPHIL #: 4.1 10*3/uL (ref 1.85–7.80)
NEUTROPHIL %: 60 % (ref 43–77)
PLATELETS: 160 10*3/uL (ref 140–440)
RBC: 3.87 10*6/uL (ref 3.63–4.92)
RDW: 13.2 % (ref 12.3–17.7)
WBC: 6.8 10*3/uL (ref 3.8–11.8)

## 2022-09-27 MED ORDER — ONDANSETRON HCL 8 MG TABLET
ORAL_TABLET | ORAL | Status: AC
Start: 2022-09-27 — End: 2022-09-27
  Filled 2022-09-27: qty 2

## 2022-09-27 MED ORDER — DIPHENHYDRAMINE 50 MG/ML INJECTION SOLUTION
INTRAMUSCULAR | Status: AC
Start: 2022-09-27 — End: 2022-09-27
  Filled 2022-09-27: qty 1

## 2022-09-27 MED ORDER — DEXAMETHASONE SODIUM PHOSPHATE (PF) 10 MG/ML INJECTION SOLUTION
INTRAMUSCULAR | Status: AC
Start: 2022-09-27 — End: 2022-09-27
  Filled 2022-09-27: qty 1

## 2022-09-27 MED ORDER — DEXAMETHASONE SODIUM PHOSPHATE (PF) 10 MG/ML INJECTION SOLUTION
10.0000 mg | Freq: Once | INTRAMUSCULAR | Status: AC
Start: 2022-09-27 — End: 2022-09-27
  Administered 2022-09-27: 10 mg via INTRAVENOUS

## 2022-09-27 MED ORDER — DIPHENHYDRAMINE 50 MG/ML INJECTION SOLUTION
50.0000 mg | Freq: Once | INTRAMUSCULAR | Status: DC | PRN
Start: 2022-09-27 — End: 2022-09-28

## 2022-09-27 MED ORDER — ALBUTEROL SULFATE 2.5 MG/3 ML (0.083 %) SOLUTION FOR NEBULIZATION
2.5000 mg | INHALATION_SOLUTION | Freq: Once | RESPIRATORY_TRACT | Status: DC | PRN
Start: 2022-09-27 — End: 2022-09-28

## 2022-09-27 MED ORDER — HYDROCORTISONE SOD SUCCINATE 100 MG/2 ML VIAL WRAPPER
100.0000 mg | Freq: Once | INTRAMUSCULAR | Status: DC | PRN
Start: 2022-09-27 — End: 2022-09-28

## 2022-09-27 MED ORDER — EPINEPHRINE 1 MG/ML (1 ML) INJECTION SOLUTION
0.3000 mg | Freq: Once | INTRAMUSCULAR | Status: DC | PRN
Start: 2022-09-27 — End: 2022-09-28

## 2022-09-27 MED ORDER — DIPHENHYDRAMINE 50 MG/ML INJECTION SOLUTION
25.0000 mg | Freq: Once | INTRAMUSCULAR | Status: AC
Start: 2022-09-27 — End: 2022-09-27
  Administered 2022-09-27: 25 mg via INTRAVENOUS

## 2022-09-27 MED ORDER — MEPERIDINE (PF) 25 MG/ML INJECTION SOLUTION
12.5000 mg | Freq: Once | INTRAMUSCULAR | Status: DC | PRN
Start: 2022-09-27 — End: 2022-09-28

## 2022-09-27 MED ORDER — DEXTROSE 5% IN WATER (D5W) FLUSH BAG - 250 ML
INTRAVENOUS | Status: DC | PRN
Start: 2022-09-27 — End: 2022-09-28

## 2022-09-27 MED ORDER — FAMOTIDINE (PF) 20 MG/2 ML INTRAVENOUS SOLUTION
20.0000 mg | Freq: Once | INTRAVENOUS | Status: AC
Start: 2022-09-27 — End: 2022-09-27
  Administered 2022-09-27: 20 mg via INTRAVENOUS

## 2022-09-27 MED ORDER — ALBUTEROL SULFATE HFA 90 MCG/ACTUATION AEROSOL INHALER - RN
2.0000 | Freq: Once | RESPIRATORY_TRACT | Status: DC | PRN
Start: 2022-09-27 — End: 2022-09-28

## 2022-09-27 MED ORDER — DIPHENHYDRAMINE 50 MG/ML INJECTION SOLUTION
25.0000 mg | Freq: Once | INTRAMUSCULAR | Status: DC | PRN
Start: 2022-09-27 — End: 2022-09-28

## 2022-09-27 MED ORDER — HYDROCORTISONE SOD SUCCINATE 100 MG/2 ML VIAL WRAPPER
INTRAMUSCULAR | Status: AC
Start: 2022-09-27 — End: 2022-09-27
  Filled 2022-09-27: qty 2

## 2022-09-27 MED ORDER — SODIUM CHLORIDE 0.9% FLUSH BAG - 250 ML
INTRAVENOUS | Status: DC | PRN
Start: 2022-09-27 — End: 2022-09-28

## 2022-09-27 MED ORDER — FAMOTIDINE (PF) 20 MG/2 ML INTRAVENOUS SOLUTION
20.0000 mg | Freq: Once | INTRAVENOUS | Status: DC | PRN
Start: 2022-09-27 — End: 2022-09-28

## 2022-09-27 MED ORDER — FAMOTIDINE (PF) 20 MG/2 ML INTRAVENOUS SOLUTION
INTRAVENOUS | Status: AC
Start: 2022-09-27 — End: 2022-09-27
  Filled 2022-09-27: qty 2

## 2022-09-27 MED ORDER — ONDANSETRON HCL 8 MG TABLET
16.0000 mg | ORAL_TABLET | Freq: Once | ORAL | Status: AC
Start: 2022-09-27 — End: 2022-09-27
  Administered 2022-09-27: 16 mg via ORAL

## 2022-09-27 MED ORDER — TRASTUZUMAB-ANNS 420 MG INTRAVENOUS SOLUTION
4.0000 mg/kg | Freq: Once | INTRAVENOUS | Status: AC
Start: 2022-09-27 — End: 2022-09-27
  Administered 2022-09-27: 378 mg via INTRAVENOUS
  Administered 2022-09-27: 0 mg via INTRAVENOUS
  Filled 2022-09-27: qty 18

## 2022-09-27 MED ORDER — SODIUM CHLORIDE 0.9 % INTRAVENOUS SOLUTION
80.0000 mg/m2 | Freq: Once | INTRAVENOUS | Status: AC
Start: 2022-09-27 — End: 2022-09-27
  Administered 2022-09-27: 168 mg via INTRAVENOUS
  Administered 2022-09-27: 0 mg via INTRAVENOUS
  Filled 2022-09-27: qty 28

## 2022-09-27 NOTE — Nursing Note (Signed)
Diagnosis: breast cancer  Patient completed psychosocial distress screening using the Enhanced NCCN Distress Thermometer (DT) Tool and self-reported an overall score of 2. Several family members present during time of education and consent per patient request. Patient marked fatigue, changes to memory, changes to appearance, and moving around home safely as concerns. When questioned patient reports fatigue and memory issues baseline, concerns regarding appearance r/t aging and concern r/t possible mastectomy in future. Patient reports she is currently able to move around hime safely and has good family support. Assessed for barriers to learning, language, learning preference, and teaching needs. Verbally reviewed that appointments may be rescheduled but missed appointments will be followed up with a phone call or letter from our clinic. Goals of treatment, side effects, and infertility risk verbally reviewed by physician at the time of signing an informed consent for anticancer treatment. Assessed patient's ability to adhere to prescribed medications and addressed possible barriers to adherence; none identified at this time. A copy of signed consent was given to the patient and a copy was scanned into patient's chart.   Treatment plan verbally reviewed and written information provided on chemotherapy:  Paclitaxel, Trastuzumab  Supportive Medications: Zofran, Decadron, Benadryl, Pepcid  Generalized chemotherapy and medication specific side effects reviewed.  Schedule: Paclitaxel weekly x12, Trastuzumab every 21 days  Planned Duration: until disease progression or intolerability/17 cycles    The following written information was provided and reviewed:  [employer] Chemotherapy and You  ASCO Answers: Understanding Immunotherapy  Administering Chemotherapy at [employer] [facility] [employer]     [employer] telephone triage phone number given to patient with verbal instructions  on how to access this support should the patient develop symptoms, side effects and when necessary go to a local emergency room. Patient had PAC placed yesterday. Care has been coordinated with outpatient oncology department. Patient was engaged during the treatment plan education and verbalized understanding of treatment plan.

## 2022-09-27 NOTE — Progress Notes (Signed)
Department of Hematology/Oncology  Progress Note   Name: Kelsey Preston  EPP:[id number]  Date of Birth: 06-01-1940  Encounter Date: 09/27/2022    REFERRING PROVIDER:  Charm Rings, MD  [address]  [place],  [place] [address]    REASON FOR OFFICE VISIT:  Breast Cancer     HISTORY OF PRESENT ILLNESS:  Kelsey Preston is a 82 y.o. female who presents today for follow up of breast cancer     The patient has a history of non-small-cell lung cancer, status post lobectomy in 2014.      She also has a history of endometrial cancer, and is status post surgical resection of that cancer as well.    More recently, the patient was found to have a left axillary lesion measuring 2.3 cm on PET-CT scan.  For some reason, I can not find an actual size measurement on the pathology report.  Multiple imaging studies of the breast were performed, but no primary lesion was found.  The malignant lesion was definitively classified as a lymph node.    Receptor staining showed that it was ER negative and HER2 Neu positive.  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    09/10/2022: The patient is here for follow up of early stage breast cancer.  She is doing well and denies any new problems at this time.    09/27/2022: The patient is here for follow up breast cancer.  She is doing well at this time and has no new complaints.    ROS:   Pertinent review of systems as discussed in HPI    HISTORY:  Past Medical History:   Diagnosis Date    Diabetes mellitus, type 2 (CMS HCC)     Embolism (CMS HCC)     Endometrial cancer (CMS HCC)     HTN (hypertension)     Lung cancer (CMS HCC)     Macular degeneration (senile) of retina          Past Surgical History:   Procedure Laterality Date    HX CHOLECYSTECTOMY      HX COLONOSCOPY      HX HYSTERECTOMY      HX LOBECTOMY      LUNG CANCER SURGERY           Social History     Socioeconomic History    Marital status: Married     Spouse name: Not on file    Number of children: Not on file    Years of education: Not  on file    Highest education level: Not on file   Occupational History    Not on file   Tobacco Use    Smoking status: Never    Smokeless tobacco: Never   Vaping Use    Vaping status: Never Used   Substance and Sexual Activity    Alcohol use: Never    Drug use: Never    Sexual activity: Not on file   Other Topics Concern    Not on file   Social History Narrative    Not on file     Social Determinants of Health     Financial Resource Strain: Not on file   Transportation Needs: Not on file   Social Connections: Low Risk  (05/30/2022)    Social Connections     SDOH Social Isolation: 5 or more times a week   Intimate Partner Violence: Not on file   Housing Stability: Not on file  Family Medical History:       Problem Relation (Age of Onset)    Cancer Brother            Current Outpatient Medications   Medication Sig    atorvastatin (LIPITOR) 40 mg Oral Tablet Take 1 Tablet (40 mg total) by mouth Every evening for 30 days    esomeprazole magnesium (NEXIUM) 40 mg Oral Capsule, Delayed Release(E.C.) Take 1 Capsule (40 mg total) by mouth Every morning before breakfast    ezetimibe (ZETIA) 10 mg Oral Tablet Take 1 Tablet (10 mg total) by mouth Every evening    famotidine (PEPCID) 40 mg Oral Tablet Take 1 Tablet (40 mg total) by mouth Once a day    hydroCHLOROthiazide (HYDRODIURIL) 25 mg Oral Tablet Take 1 Tablet (25 mg total) by mouth Once a day    metFORMIN (GLUCOPHAGE XR) 500 mg Oral Tablet Sustained Release 24 hr Take 1 Tablet (500 mg total) by mouth Twice daily    metFORMIN (GLUCOPHAGE) 500 mg Oral Tablet Take 1 Tablet (500 mg total) by mouth Twice daily with food    multivitamin with iron Oral Tablet Take 1 Tablet by mouth Once a day    ondansetron (ZOFRAN ODT) 4 mg Oral Tablet, Rapid Dissolve Take 1 Tablet (4 mg total) by mouth Every 8 hours as needed for Nausea/Vomiting Indications: prevent nausea and vomiting from cancer chemotherapy    rivaroxaban (XARELTO) 20 mg Oral Tablet Take 1 Tablet (20 mg total) by mouth  Every evening with dinner for 120 days    sertraline (ZOLOFT) 50 mg Oral Tablet Take 1 Tablet (50 mg total) by mouth Once a day    telmisartan (MICARDIS) 80 mg Oral Tablet Take 1 Tablet (80 mg total) by mouth Once a day    traMADoL (ULTRAM) 50 mg Oral Tablet Take 1 Tablet (50 mg total) by mouth Every 4 hours as needed for Pain    traMADoL (ULTRAM) 50 mg Oral Tablet Take 1 Tablet (50 mg total) by mouth Every 4 hours as needed for Pain     Allergies   Allergen Reactions    Adhesive Rash    Ceclor [Cefaclor] Rash       PHYSICAL EXAM:  Most Recent Vitals    Flowsheet Row Telemedicine from 09/02/2022 in Hematology/Oncology, [facility]   Temperature 36.3 C (97.3 F) filed at... 09/02/2022 0911   Heart Rate 77 filed at... 09/02/2022 0911   Respiratory Rate --   BP (Non-Invasive) 174/91 filed at... 09/02/2022 0911   SpO2 95 % filed at... 09/02/2022 0911   Height 1.626 m (5\' 4" ) filed at... 09/02/2022 0911   Weight 98.2 kg (216 lb 6.4 oz) filed at... 09/02/2022 0911   BMI (Calculated) 37.22 filed at... 09/02/2022 0911   BSA (Calculated) 2.11 filed at... 09/02/2022 0911      ECOG Status: (1) Restricted in physically strenuous activity, ambulatory and able to do work of light nature   Physical Exam    DIAGNOSTIC DATA:  No results found for this or any previous visit (from the past 11914 hour(s)).    LABS:   CBC  Diff   Lab Results   Component Value Date/Time    WBC 6.8 09/27/2022 10:06 AM    HGB 11.5 09/27/2022 10:06 AM    HCT 34.3 09/27/2022 10:06 AM    PLTCNT 160 09/27/2022 10:06 AM    RBC 3.87 09/27/2022 10:06 AM    MCV 88.7 09/27/2022 10:06 AM    MCHC 33.5 09/27/2022  10:06 AM    MCH 29.7 09/27/2022 10:06 AM    RDW 13.2 09/27/2022 10:06 AM    MPV 9.6 09/27/2022 10:06 AM    Lab Results   Component Value Date/Time    PMNS 60 09/27/2022 10:06 AM    LYMPHOCYTES 31 09/27/2022 10:06 AM    EOSINOPHIL 1 09/27/2022 10:06 AM    MONOCYTES 7 09/27/2022 10:06 AM    BASOPHILS 1 09/27/2022 10:06 AM    BASOPHILS 0.00 09/27/2022  10:06 AM    PMNABS 4.10 09/27/2022 10:06 AM    LYMPHSABS 2.10 09/27/2022 10:06 AM    EOSABS 0.10 09/27/2022 10:06 AM    MONOSABS 0.50 09/27/2022 10:06 AM    BASABS 0.04 05/30/2022 05:40 AM            Comprehensive Metabolic Profile    Lab Results   Component Value Date    SODIUM 137 09/27/2022    POTASSIUM 4.9 09/27/2022    CHLORIDE 100 09/27/2022    CO2 31 09/27/2022    ANIONGAP 6 09/27/2022    BUN 25 09/27/2022    CREATININE 1.33 (H) 09/27/2022    ALBUMIN 4.1 09/27/2022    CALCIUM 10.7 (H) 09/27/2022    GLUCOSENF 151 (H) 09/27/2022    ALKPHOS 116 (H) 09/27/2022    ALT 16 09/27/2022    AST 19 09/27/2022    TOTBILIRUBIN 0.4 09/27/2022    TOTALPROTEIN 7.2 09/27/2022          BASIC METABOLIC PANEL  Lab Results   Component Value Date    SODIUM 137 09/27/2022    POTASSIUM 4.9 09/27/2022    CHLORIDE 100 09/27/2022    CO2 31 09/27/2022    ANIONGAP 6 09/27/2022    BUN 25 09/27/2022    CREATININE 1.33 (H) 09/27/2022    BUNCRRATIO 19 09/27/2022    GFR 41 (L) 09/27/2022    CALCIUM 10.7 (H) 09/27/2022    GLUCOSE Negative 05/29/2022    GLUCOSENF 151 (H) 09/27/2022           ASSESSMENT:  Problem List Items Addressed This Visit          Oncology    Breast cancer (CMS HCC) - Primary        ICD-10-CM    1. Breast cancer (CMS HCC)  C50.919            PLAN:   1. All relevant medical records were reviewed including available pertinent provider notes, procedure notes, imaging, laboratory, and pathology.   2. All pertinent labs and/or imaging were reviewed with the patient.   3. Breast cancer:  Status post resection of an involved lymph node that was 2.3 cm on PET/CT.  No primary lesion was seen on imaging studies.  T0 N1 M0, stage IIA.  ER negative, HER2 Neu positive.  After resection of the node, she was rendered cancer-free radiographically.  After reviewing the NCCN guidelines, the patient has chosen to move forward with paclitaxel and trastuzumab.  With the unknown primary location, we discussed the options of either no additional  surgery and taking our chances versus radiation of the breast versus mastectomy.  I believe she is still undecided but maybe leaning towards mastectomy.  That is a decision that can be made later because we have 3 months of chemotherapy to perform first.  Radiation to the axillary area may be a requirement anyway due to the lymph node that was known to be involved.  4. History of lung cancer:  Continue surveillance.  5. History of gynecologic cancer: Continue surveillance as well.  6. Fatigue:  Her T3 and T4 on the lower end of normal range.  She could potentially have a little bit of thyroid medication.  She will think about it.    Talanda Ferriell was given the chance to ask questions, and these were answered to their satisfaction. The patient is welcome to call with any questions or concerns in the meantime.     On the day of the encounter, a total of 35 minutes was spent on this patient encounter including review of historical information, examination, documentation and post-visit activities.   Return in about 3 weeks (around 10/18/2022).     Lupita Dawn, MD  09/27/2022 , 11:17  The patient's insurance company bears full legal and financial responsibility resulting from any deviations that they cause to my recommended treatment plan.   CC:  Charm Rings, MD  [address]  [place] [address]    Charm Rings, MD  [address]  [place],  [place] [address]    This note was partially generated using MModal Fluency Direct system, and there may be some incorrect words, spellings, and punctuation that were not noted in checking the note before saving.

## 2022-09-27 NOTE — Nurses Notes (Addendum)
1121 Pt to OP Onc unit after appt with Dr.Mackey. Labs drawn prior to clinic appt by clinic staff. Felecia Jan, RN  [phone] R chest wall mediport was placed on 09/26/22. Incision well approximated with steri strips intact. Port flushes easily with excellent blood return noted. Pt has Zofran po at home. Verbal education provided. Written handouts provide to pt from Dr.'s office. Questions answered. Felecia Jan, RN  [phone] Zofran 16 mg po given. Felecia Jan, RN  1149 Benadryl 25 mg IVP given. Felecia Jan, RN  [phone] Pepcid 20 mg IVP given. Felecia Jan, RN  1154 Dexamethasone 10 mg slow IVP given. Line flushed with NS. Felecia Jan, RN  [phone] Message sent to [place] Staff RN re: script for EMLA cream. [place] states request sent to Dr.Mackey to sign. Pt and dtr-in-law aware. Felecia Jan, RN  1225 Kanjinti 378 mg IV infusion started. Felecia Jan, RN  [phone] Kanjinti infusion completed. Line flushed with NS. Felecia Jan, RN  [phone] Taxol 168 mg IV infusion started. Felecia Jan, RN  1420 No adverse reaction noted. Family at bedside. Felecia Jan, RN  1440 Pt denies c/o. Taxol infusing without difficulty. Felecia Jan, RN  [phone] Taxol infusion completed. Line flushed with NS. Felecia Jan, RN  [phone] VS obtained. Pt tolerated tx without difficulty. Felecia Jan, RN  [phone] Port flushed with 30 ml NS. Demetrios Isaacs d/c'ed. Site clear. Steri-strips intact. Band aid applied. No adverse reaction noted. Felecia Jan, RN  1525 Pt ambulatory to BR with assist of dtr-in-law and husband. Pt and family instructed to call with any questions or concerns. Understanding verbalized. Felecia Jan, RN  [phone] Pt left OP Onc unit via wheelchair with volunteer and family to assist. Felecia Jan, RN

## 2022-09-30 MED ORDER — LIDOCAINE-PRILOCAINE 2.5 %-2.5 % TOPICAL CREAM
TOPICAL_CREAM | Freq: Once | CUTANEOUS | 3 refills | Status: DC
Start: 2022-09-30 — End: 2022-10-09

## 2022-10-03 ENCOUNTER — Ambulatory Visit (INDEPENDENT_AMBULATORY_CARE_PROVIDER_SITE_OTHER): Payer: Self-pay | Admitting: HEMATOLOGY-ONCOLOGY

## 2022-10-03 NOTE — Telephone Encounter (Addendum)
Patient notified that she can take Imodium.  Patient to call if diarrhea is not relieved with Imodium.    ----- Message from Gibson Ramp sent at 10/03/2022  3:19 PM EDT -----  [name] pt   Pt states she has diarrhea and wants to know if she can take Imodium? Please return her call, she states she has infusion scheduled for tomorrow

## 2022-10-04 ENCOUNTER — Other Ambulatory Visit: Payer: Self-pay

## 2022-10-04 ENCOUNTER — Ambulatory Visit (HOSPITAL_COMMUNITY)
Admission: RE | Admit: 2022-10-04 | Discharge: 2022-10-04 | Disposition: A | Discharge status: Home / Self Care | Payer: Medicare Other | Source: Ambulatory Visit | Attending: HEMATOLOGY-ONCOLOGY | Admitting: HEMATOLOGY-ONCOLOGY

## 2022-10-04 ENCOUNTER — Inpatient Hospital Stay (INDEPENDENT_AMBULATORY_CARE_PROVIDER_SITE_OTHER)
Admission: RE | Admit: 2022-10-04 | Discharge: 2022-10-04 | Disposition: A | Discharge status: Home / Self Care | Payer: Medicare Other | Source: Ambulatory Visit | Attending: HEMATOLOGY-ONCOLOGY | Admitting: HEMATOLOGY-ONCOLOGY

## 2022-10-04 ENCOUNTER — Encounter (HOSPITAL_COMMUNITY): Payer: Self-pay

## 2022-10-04 VITALS — BP 143/65 | HR 82 | Temp 98.5°F | Resp 18 | Ht 64.0 in | Wt 211.6 lb

## 2022-10-04 DIAGNOSIS — I82412 Acute embolism and thrombosis of left femoral vein: Secondary | ICD-10-CM | POA: Insufficient documentation

## 2022-10-04 DIAGNOSIS — C50919 Malignant neoplasm of unspecified site of unspecified female breast: Secondary | ICD-10-CM | POA: Insufficient documentation

## 2022-10-04 DIAGNOSIS — Z5111 Encounter for antineoplastic chemotherapy: Secondary | ICD-10-CM | POA: Insufficient documentation

## 2022-10-04 DIAGNOSIS — Z5112 Encounter for antineoplastic immunotherapy: Secondary | ICD-10-CM | POA: Insufficient documentation

## 2022-10-04 LAB — CBC WITH DIFF
BASOPHIL #: 0 10*3/uL (ref 0.00–0.10)
BASOPHIL %: 1 % (ref 0–1)
EOSINOPHIL #: 0.1 10*3/uL (ref 0.00–0.50)
EOSINOPHIL %: 3 %
HCT: 34.1 % (ref 31.2–41.9)
HGB: 11.4 g/dL (ref 10.9–14.3)
LYMPHOCYTE #: 1.5 10*3/uL (ref 1.00–3.00)
LYMPHOCYTE %: 37 % (ref 16–44)
MCH: 29.7 pg (ref 24.7–32.8)
MCHC: 33.3 g/dL (ref 32.3–35.6)
MCV: 89 fL (ref 75.5–95.3)
MONOCYTE #: 0.2 10*3/uL — ABNORMAL LOW (ref 0.30–1.00)
MONOCYTE %: 4 % — ABNORMAL LOW (ref 5–13)
MPV: 9.4 fL (ref 7.9–10.8)
NEUTROPHIL #: 2.3 10*3/uL (ref 1.85–7.80)
NEUTROPHIL %: 56 % (ref 43–77)
PLATELETS: 150 10*3/uL (ref 140–440)
RBC: 3.83 10*6/uL (ref 3.63–4.92)
RDW: 13.4 % (ref 12.3–17.7)
WBC: 4.2 10*3/uL (ref 3.8–11.8)

## 2022-10-04 MED ORDER — SODIUM CHLORIDE 0.9 % INTRAVENOUS SOLUTION
80.0000 mg/m2 | Freq: Once | INTRAVENOUS | Status: AC
Start: 2022-10-04 — End: 2022-10-04
  Administered 2022-10-04: 168 mg via INTRAVENOUS
  Administered 2022-10-04: 0 mg via INTRAVENOUS
  Filled 2022-10-04: qty 28

## 2022-10-04 MED ORDER — DIPHENHYDRAMINE 50 MG/ML INJECTION SOLUTION
25.0000 mg | Freq: Once | INTRAMUSCULAR | Status: AC
Start: 2022-10-04 — End: 2022-10-04
  Administered 2022-10-04: 25 mg via INTRAVENOUS

## 2022-10-04 MED ORDER — MEPERIDINE (PF) 25 MG/ML INJECTION SOLUTION
12.5000 mg | Freq: Once | INTRAMUSCULAR | Status: DC | PRN
Start: 2022-10-04 — End: 2022-10-05

## 2022-10-04 MED ORDER — DIPHENHYDRAMINE 50 MG/ML INJECTION SOLUTION
25.0000 mg | Freq: Once | INTRAMUSCULAR | Status: DC | PRN
Start: 2022-10-04 — End: 2022-10-05

## 2022-10-04 MED ORDER — HYDROCORTISONE SOD SUCCINATE 100 MG/2 ML VIAL WRAPPER
100.0000 mg | Freq: Once | INTRAMUSCULAR | Status: DC | PRN
Start: 2022-10-04 — End: 2022-10-05

## 2022-10-04 MED ORDER — ONDANSETRON HCL 8 MG TABLET
16.0000 mg | ORAL_TABLET | Freq: Once | ORAL | Status: AC
Start: 2022-10-04 — End: 2022-10-04
  Administered 2022-10-04: 16 mg via ORAL

## 2022-10-04 MED ORDER — DEXAMETHASONE SODIUM PHOSPHATE (PF) 10 MG/ML INJECTION SOLUTION
10.0000 mg | Freq: Once | INTRAMUSCULAR | Status: AC
Start: 2022-10-04 — End: 2022-10-04
  Administered 2022-10-04: 10 mg via INTRAVENOUS

## 2022-10-04 MED ORDER — ALBUTEROL SULFATE HFA 90 MCG/ACTUATION AEROSOL INHALER - RN
2.0000 | Freq: Once | RESPIRATORY_TRACT | Status: DC | PRN
Start: 2022-10-04 — End: 2022-10-05

## 2022-10-04 MED ORDER — ONDANSETRON HCL 8 MG TABLET
ORAL_TABLET | ORAL | Status: AC
Start: 2022-10-04 — End: 2022-10-04
  Filled 2022-10-04: qty 2

## 2022-10-04 MED ORDER — TRASTUZUMAB-ANNS 150 MG INTRAVENOUS SOLUTION
2.0000 mg/kg | Freq: Once | INTRAVENOUS | Status: AC
Start: 2022-10-04 — End: 2022-10-04
  Administered 2022-10-04: 0 mg via INTRAVENOUS
  Administered 2022-10-04: 189 mg via INTRAVENOUS
  Filled 2022-10-04: qty 9

## 2022-10-04 MED ORDER — FAMOTIDINE (PF) 20 MG/2 ML INTRAVENOUS SOLUTION
20.0000 mg | Freq: Once | INTRAVENOUS | Status: DC | PRN
Start: 2022-10-04 — End: 2022-10-05

## 2022-10-04 MED ORDER — DEXAMETHASONE SODIUM PHOSPHATE (PF) 10 MG/ML INJECTION SOLUTION
INTRAMUSCULAR | Status: AC
Start: 2022-10-04 — End: 2022-10-04
  Filled 2022-10-04: qty 1

## 2022-10-04 MED ORDER — SODIUM CHLORIDE 0.9% FLUSH BAG - 250 ML
INTRAVENOUS | Status: DC | PRN
Start: 2022-10-04 — End: 2022-10-05

## 2022-10-04 MED ORDER — EPINEPHRINE 1 MG/ML (1 ML) INJECTION SOLUTION
0.3000 mg | Freq: Once | INTRAMUSCULAR | Status: DC | PRN
Start: 2022-10-04 — End: 2022-10-05

## 2022-10-04 MED ORDER — DEXTROSE 5% IN WATER (D5W) FLUSH BAG - 250 ML
INTRAVENOUS | Status: DC | PRN
Start: 2022-10-04 — End: 2022-10-05

## 2022-10-04 MED ORDER — FAMOTIDINE (PF) 20 MG/2 ML INTRAVENOUS SOLUTION
20.0000 mg | Freq: Once | INTRAVENOUS | Status: AC
Start: 2022-10-04 — End: 2022-10-04
  Administered 2022-10-04: 20 mg via INTRAVENOUS

## 2022-10-04 MED ORDER — DIPHENHYDRAMINE 50 MG/ML INJECTION SOLUTION
INTRAMUSCULAR | Status: AC
Start: 2022-10-04 — End: 2022-10-04
  Filled 2022-10-04: qty 1

## 2022-10-04 MED ORDER — ALBUTEROL SULFATE 2.5 MG/3 ML (0.083 %) SOLUTION FOR NEBULIZATION
2.5000 mg | INHALATION_SOLUTION | Freq: Once | RESPIRATORY_TRACT | Status: DC | PRN
Start: 2022-10-04 — End: 2022-10-05

## 2022-10-04 MED ORDER — DIPHENHYDRAMINE 50 MG/ML INJECTION SOLUTION
50.0000 mg | Freq: Once | INTRAMUSCULAR | Status: DC | PRN
Start: 2022-10-04 — End: 2022-10-05

## 2022-10-04 MED ORDER — FAMOTIDINE (PF) 20 MG/2 ML INTRAVENOUS SOLUTION
INTRAVENOUS | Status: AC
Start: 2022-10-04 — End: 2022-10-04
  Filled 2022-10-04: qty 2

## 2022-10-04 NOTE — Nurses Notes (Signed)
1040 Patient arrived to unit for 2nd chemotherapy infusion. Patient is ambulatory to unit and accompanied by family members. Labs were collected at the oncology clinic prior to arrival. Height and weight obtained. Tonna Boehringer, RN  [phone] Port-a-cath accessed to right chest and blood return present. VSS. Tonna Boehringer, RN    Patient Assessment/Symptom Management Patient Has No MD Appointment Today   Key: (+) Symptom present           (-)  Symptom not present If Symptom is Positive(+) a Nursing Note is required   Edema -   Uncontrolled Nausea -   Vomiting +    Inability to eat/drink -   Mouth Sores -   Diarrhea +   Constipation (? Last BM) -   Fatigue that interferes with ADL's -   Numbness/Tingling -change -   Other -   Fever/Signs & Symptoms of infection -   Nurse Initials      Patient reports 1 episode of vomiting after last treatment. Nausea has subsided and no other episodes of vomiting. Patient has antiemetics at home to take. Patient also reports a few episodes diarrhea yesterday. The diarrhea stopped without imodium but she has it at home if needed. Patient also reports some fatigue which is baseline and numbness and tingling to feet. Tonna Boehringer, RN  1100 Zofran given PO. Tonna Boehringer, RN  [phone] Dexamethasone given IVP. Tonna Boehringer, RN  [phone] Pepcid given IVP. Tonna Boehringer, RN  [phone] Benadryl given IVP. Tonna Boehringer, RN  1139 Kanjinti infusion started. Call light within reach and family at the bedside. Tonna Boehringer, RN  1239 Kanjinti infusion complete. Patient tolerated infusion well. Tonna Boehringer, RN  [phone] Paclitaxel infusion started. Call light within reach and family at the bedside. Tonna Boehringer, RN  1346 Paclitaxel infusion complete. Patient tolerated infusion well. Tonna Boehringer, RN  [phone] Port flushed with NS and deaccessed. Pressure dressing applied. VSS. Tonna Boehringer, RN  [phone] Patient discharged via wheelchair pushed by hospital volunteer. Tonna Boehringer, RN

## 2022-10-07 ENCOUNTER — Encounter (HOSPITAL_COMMUNITY): Payer: Self-pay

## 2022-10-07 ENCOUNTER — Emergency Department (HOSPITAL_COMMUNITY): Payer: Medicare Other

## 2022-10-07 ENCOUNTER — Other Ambulatory Visit: Payer: Self-pay

## 2022-10-07 ENCOUNTER — Inpatient Hospital Stay
Admission: EM | Admit: 2022-10-07 | Discharge: 2022-10-09 | DRG: 392 | Disposition: A | Discharge status: Home / Self Care | Payer: Medicare Other

## 2022-10-07 DIAGNOSIS — Z23 Encounter for immunization: Secondary | ICD-10-CM

## 2022-10-07 DIAGNOSIS — R001 Bradycardia, unspecified: Secondary | ICD-10-CM | POA: Diagnosis present

## 2022-10-07 DIAGNOSIS — Z853 Personal history of malignant neoplasm of breast: Secondary | ICD-10-CM

## 2022-10-07 DIAGNOSIS — C7800 Secondary malignant neoplasm of unspecified lung: Secondary | ICD-10-CM | POA: Diagnosis present

## 2022-10-07 DIAGNOSIS — Z86718 Personal history of other venous thrombosis and embolism: Secondary | ICD-10-CM

## 2022-10-07 DIAGNOSIS — E119 Type 2 diabetes mellitus without complications: Secondary | ICD-10-CM | POA: Diagnosis present

## 2022-10-07 DIAGNOSIS — C50919 Malignant neoplasm of unspecified site of unspecified female breast: Secondary | ICD-10-CM | POA: Diagnosis present

## 2022-10-07 DIAGNOSIS — Z79899 Other long term (current) drug therapy: Secondary | ICD-10-CM

## 2022-10-07 DIAGNOSIS — A419 Sepsis, unspecified organism: Secondary | ICD-10-CM | POA: Insufficient documentation

## 2022-10-07 DIAGNOSIS — R109 Unspecified abdominal pain: Secondary | ICD-10-CM

## 2022-10-07 DIAGNOSIS — R61 Generalized hyperhidrosis: Secondary | ICD-10-CM | POA: Diagnosis present

## 2022-10-07 DIAGNOSIS — M40209 Unspecified kyphosis, site unspecified: Secondary | ICD-10-CM | POA: Diagnosis present

## 2022-10-07 DIAGNOSIS — K529 Noninfective gastroenteritis and colitis, unspecified: Principal | ICD-10-CM | POA: Diagnosis present

## 2022-10-07 DIAGNOSIS — I10 Essential (primary) hypertension: Secondary | ICD-10-CM | POA: Diagnosis present

## 2022-10-07 DIAGNOSIS — I959 Hypotension, unspecified: Secondary | ICD-10-CM | POA: Diagnosis present

## 2022-10-07 DIAGNOSIS — Z7984 Long term (current) use of oral hypoglycemic drugs: Secondary | ICD-10-CM

## 2022-10-07 LAB — COMPREHENSIVE METABOLIC PANEL, NON-FASTING
ALBUMIN/GLOBULIN RATIO: 1.3 (ref 0.8–1.4)
ALBUMIN: 3.5 g/dL (ref 3.5–5.7)
ALKALINE PHOSPHATASE: 107 U/L — ABNORMAL HIGH (ref 34–104)
ALT (SGPT): 21 U/L (ref 7–52)
ANION GAP: 6 mmol/L (ref 4–13)
AST (SGOT): 27 U/L (ref 13–39)
BILIRUBIN TOTAL: 0.8 mg/dL (ref 0.3–1.2)
BUN/CREA RATIO: 19 (ref 6–22)
BUN: 23 mg/dL (ref 7–25)
CALCIUM, CORRECTED: 10.2 mg/dL (ref 8.9–10.8)
CALCIUM: 9.8 mg/dL (ref 8.6–10.3)
CHLORIDE: 104 mmol/L (ref 98–107)
CO2 TOTAL: 27 mmol/L (ref 21–31)
CREATININE: 1.24 mg/dL (ref 0.60–1.30)
ESTIMATED GFR: 44 mL/min/{1.73_m2} — ABNORMAL LOW (ref >59)
GLOBULIN: 2.7 — ABNORMAL LOW (ref 2.9–5.4)
GLUCOSE: 149 mg/dL — ABNORMAL HIGH (ref 74–109)
OSMOLALITY, CALCULATED: 280 mOsm/kg (ref 270–290)
POTASSIUM: 3.9 mmol/L (ref 3.5–5.1)
PROTEIN TOTAL: 6.2 g/dL — ABNORMAL LOW (ref 6.4–8.9)
SODIUM: 137 mmol/L (ref 136–145)

## 2022-10-07 LAB — URINALYSIS, MICROSCOPIC
RBCS: 2 /hpf (ref <4)
RENAL EPITHELIAL CELLS URINE: <1 /hpf (ref <6)
SQUAMOUS EPITHELIAL: 6 /hpf (ref <28)
TRANSITIONAL EPITHELIAL CELLS URINE: <1 /hpf (ref <6)
WBCS: 1 /hpf (ref <6)

## 2022-10-07 LAB — MAGNESIUM: MAGNESIUM: 1 mg/dL — ABNORMAL LOW (ref 1.9–2.7)

## 2022-10-07 LAB — CBC WITH DIFF
BASOPHIL #: 0 10*3/uL (ref 0.00–0.10)
BASOPHIL %: 0 % (ref 0–1)
EOSINOPHIL #: 0 10*3/uL (ref 0.00–0.50)
EOSINOPHIL %: 1 %
HCT: 31.7 % (ref 31.2–41.9)
HGB: 10.7 g/dL — ABNORMAL LOW (ref 10.9–14.3)
LYMPHOCYTE #: 0.8 10*3/uL — ABNORMAL LOW (ref 1.00–3.00)
LYMPHOCYTE %: 32 % (ref 16–44)
MCH: 29.7 pg (ref 24.7–32.8)
MCHC: 33.6 g/dL (ref 32.3–35.6)
MCV: 88.4 fL (ref 75.5–95.3)
MONOCYTE #: 0 10*3/uL — ABNORMAL LOW (ref 0.30–1.00)
MONOCYTE %: 2 % — ABNORMAL LOW (ref 5–13)
MPV: 9.7 fL (ref 7.9–10.8)
NEUTROPHIL #: 1.7 10*3/uL — ABNORMAL LOW (ref 1.85–7.80)
NEUTROPHIL %: 65 % (ref 43–77)
PLATELETS: 125 10*3/uL — ABNORMAL LOW (ref 140–440)
RBC: 3.59 10*6/uL — ABNORMAL LOW (ref 3.63–4.92)
RDW: 13.3 % (ref 12.3–17.7)
WBC: 2.6 10*3/uL — ABNORMAL LOW (ref 3.8–11.8)

## 2022-10-07 LAB — URINALYSIS, MACROSCOPIC
BILIRUBIN: NEGATIVE mg/dL
BLOOD: NEGATIVE mg/dL
GLUCOSE: NEGATIVE mg/dL
KETONES: NEGATIVE mg/dL
LEUKOCYTES: NEGATIVE WBCs/uL
NITRITE: NEGATIVE
PH: 5.5 (ref 5.0–9.0)
PROTEIN: NEGATIVE mg/dL
SPECIFIC GRAVITY: 1.043 — ABNORMAL HIGH (ref 1.002–1.030)
UROBILINOGEN: NORMAL mg/dL

## 2022-10-07 LAB — PT/INR
INR: 1.26 — ABNORMAL HIGH (ref 0.84–1.10)
PROTHROMBIN TIME: 14.8 seconds — ABNORMAL HIGH (ref 9.8–12.7)

## 2022-10-07 LAB — TROPONIN-I
TROPONIN I: 4 ng/L (ref <15)
TROPONIN I: 4 ng/L (ref <15)
TROPONIN I: 5 ng/L (ref <15)

## 2022-10-07 LAB — GOLD TOP TUBE

## 2022-10-07 LAB — GRAY TOP TUBE

## 2022-10-07 LAB — THYROID STIMULATING HORMONE (SENSITIVE TSH): TSH: 5.036 u[IU]/mL (ref 0.450–5.330)

## 2022-10-07 LAB — LIPASE: LIPASE: 57 U/L (ref 11–82)

## 2022-10-07 LAB — LACTIC ACID LEVEL W/ REFLEX FOR LEVEL >2.0: LACTIC ACID: 1.2 mmol/L (ref 0.5–2.2)

## 2022-10-07 MED ORDER — ONDANSETRON HCL (PF) 4 MG/2 ML INJECTION SOLUTION
4.0000 mg | Freq: Four times a day (QID) | INTRAMUSCULAR | Status: DC | PRN
Start: 2022-10-07 — End: 2022-10-09
  Administered 2022-10-08: 4 mg via INTRAVENOUS
  Filled 2022-10-07: qty 2

## 2022-10-07 MED ORDER — MAGNESIUM SULFATE 1 GRAM/100 ML IN DEXTROSE 5 % INTRAVENOUS PIGGYBACK
INJECTION | INTRAVENOUS | Status: AC
Start: 2022-10-07 — End: 2022-10-07
  Filled 2022-10-07: qty 100

## 2022-10-07 MED ORDER — MAGNESIUM SULFATE 1 GRAM/100 ML IN DEXTROSE 5 % INTRAVENOUS PIGGYBACK
1.0000 g | INJECTION | INTRAVENOUS | Status: AC
Start: 2022-10-07 — End: 2022-10-07
  Administered 2022-10-07 (×2): 1 g via INTRAVENOUS
  Administered 2022-10-07: 0 g via INTRAVENOUS
  Administered 2022-10-07: 1 g via INTRAVENOUS
  Administered 2022-10-07: 0 g via INTRAVENOUS

## 2022-10-07 MED ORDER — IOHEXOL 350 MG IODINE/ML INTRAVENOUS SOLUTION
50.0000 mL | INTRAVENOUS | Status: AC
Start: 2022-10-07 — End: 2022-10-07
  Administered 2022-10-07: 75 mL via INTRAVENOUS

## 2022-10-07 MED ORDER — PROCHLORPERAZINE EDISYLATE 10 MG/2 ML (5 MG/ML) INJECTION SOLUTION
10.0000 mg | Freq: Four times a day (QID) | INTRAMUSCULAR | Status: DC | PRN
Start: 2022-10-07 — End: 2022-10-09

## 2022-10-07 MED ORDER — MAGNESIUM SULFATE 1 GRAM/100 ML IN DEXTROSE 5 % INTRAVENOUS PIGGYBACK
1.0000 g | INJECTION | Freq: Once | INTRAVENOUS | Status: DC
Start: 2022-10-07 — End: 2022-10-07

## 2022-10-07 MED ORDER — SODIUM CHLORIDE 0.9 % IV BOLUS
1000.0000 mL | INJECTION | Status: AC
Start: 2022-10-07 — End: 2022-10-07
  Administered 2022-10-07: 1000 mL via INTRAVENOUS
  Administered 2022-10-07: 0 mL via INTRAVENOUS

## 2022-10-07 MED ORDER — ONDANSETRON HCL (PF) 4 MG/2 ML INJECTION SOLUTION
INTRAMUSCULAR | Status: AC
Start: 2022-10-07 — End: 2022-10-07
  Filled 2022-10-07: qty 2

## 2022-10-07 MED ORDER — PNEUMOCOCCAL 15-VALENT CONJ VACCINE-DIP CRM (PF) 0.5 ML IM SYRINGE
0.5000 mL | INJECTION | Freq: Once | INTRAMUSCULAR | Status: AC
Start: 2022-10-08 — End: 2022-10-08
  Administered 2022-10-08: 0.5 mL via INTRAMUSCULAR
  Filled 2022-10-07 (×2): qty 0.5

## 2022-10-07 MED ORDER — ONDANSETRON HCL (PF) 4 MG/2 ML INJECTION SOLUTION
4.0000 mg | INTRAMUSCULAR | Status: AC
Start: 2022-10-07 — End: 2022-10-07
  Administered 2022-10-07: 4 mg via INTRAVENOUS

## 2022-10-07 MED ORDER — HEPARIN (PORCINE) 5,000 UNIT/ML INJECTION SOLUTION
5000.0000 [IU] | Freq: Three times a day (TID) | INTRAMUSCULAR | Status: DC
Start: 2022-10-07 — End: 2022-10-09
  Administered 2022-10-07 – 2022-10-09 (×6): 5000 [IU] via SUBCUTANEOUS
  Filled 2022-10-07 (×5): qty 1

## 2022-10-07 MED ORDER — SODIUM CHLORIDE 0.9 % INTRAVENOUS SOLUTION
INTRAVENOUS | Status: DC
Start: 2022-10-07 — End: 2022-10-09

## 2022-10-07 MED ORDER — HEPARIN (PORCINE) 5,000 UNIT/ML INJECTION SOLUTION
INTRAMUSCULAR | Status: AC
Start: 2022-10-07 — End: 2022-10-07
  Filled 2022-10-07: qty 1

## 2022-10-07 MED ORDER — SODIUM CHLORIDE 0.9 % IV BOLUS
1000.0000 mL | INJECTION | Status: AC
Start: 2022-10-07 — End: 2022-10-07
  Administered 2022-10-07: 0 mL via INTRAVENOUS
  Administered 2022-10-07: 1000 mL via INTRAVENOUS

## 2022-10-07 MED ORDER — MAGNESIUM SULFATE 1 GRAM/100 ML IN DEXTROSE 5 % INTRAVENOUS PIGGYBACK
INJECTION | INTRAVENOUS | Status: AC
Start: 2022-10-07 — End: 2022-10-07
  Filled 2022-10-07: qty 200

## 2022-10-07 NOTE — ED Provider Notes (Signed)
CHIEF COMPLAINT  Chief Complaint   Patient presents with    Abdominal Pain    Nausea     HISTORY OF PRESENT ILLNESS  Kelsey Preston, date of birth [birth year], is a 82 y.o. female who presented to the Emergency Department    82 year old female called up to EMS for abdominal pain.  She was having diarrhea all day began having abdominal pain this afternoon.  Nausea but no vomiting.  She was currently being treated for metastatic breast cancer chemotherapy by Dr. [name].  Her last chemotherapy was on July 19th (Paclitaxel plus Tratuzumab ) seems she did relatively well over the weekend after her chemo started having trouble this morning.  She has a history of uterine and lung cancer both of which were resolved with surgical excision.  There were monitored for years with no reoccurrence.  More recently she had a PET-CT that revealed an axillary mass that lit up.  However no source could be found.  Pathology was consistent with breast cancer.  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    PAST MEDICAL/SURGICAL/FAMILY/SOCIAL HISTORY  Past Medical History:   Diagnosis Date    Diabetes mellitus, type 2 (CMS HCC)     Embolism (CMS HCC)     Endometrial cancer (CMS HCC)     HTN (hypertension)     Lung cancer (CMS HCC)     Macular degeneration (senile) of retina        Past Surgical History:   Procedure Laterality Date    HX CHOLECYSTECTOMY      HX COLONOSCOPY      HX HYSTERECTOMY      HX LOBECTOMY      LUNG CANCER SURGERY      PORTACATH PLACEMENT         Family Medical History:       Problem Relation (Age of Onset)    Cancer Brother          Social History     Socioeconomic History    Marital status: Married   Tobacco Use    Smoking status: Never    Smokeless tobacco: Never   Vaping Use    Vaping status: Never Used   Substance and Sexual Activity    Alcohol use: Never    Drug use: Never    Sexual activity: Not Currently     Social Determinants of Health     Social Connections: Low Risk   (05/30/2022)    Social Connections     SDOH Social Isolation: 5 or more times a week      ALLERGIES  Allergies   Allergen Reactions    Adhesive Rash    Ceclor [Cefaclor] Rash       PHYSICAL EXAM  VITAL SIGNS:  Filed Vitals:    10/07/22 1738   BP: 120/80   Pulse: (!) 121   Resp: 20   Temp: 37 C (98.6 F)   SpO2: 98%     GENERAL: PATIENT IS ALERT AND ORIENTED TO PERSON, PLACE, AND TIME.  IN NO DISTRESS  HEAD: NORMOCEPHALIC AND ATRAUMATIC.  EYES: PUPILS EQUALLY ROUND AND REACT TO LIGHT. EXTRAOCULAR MOVEMENTS INTACT.  EARS: GROSS HEARING INTACT. EXTERNAL EARS WITHIN NORMAL LIMITS.  THROAT: MOIST ORAL MUCOSA. NO ERYTHEMA OR EXUDATE OF THE PHARYNX.  NECK: SUPPLE. TRACHEA MIDLINE.  NO LYMPHADENOPATHY  CARDIOVASCULAR: REGULAR, RATE, AND RHYTHM. NO MURMUR.  LUNGS: CLEAR TO AUSCULTATION BILATERAL.  ABDOMEN: SOFT, NON-TENDER, NON-DISTENDED, AND BOWEL SOUNDS ARE PRESENT.  GENITOURINARY: DEFERRED.  RECTAL: DEFERRED.  EXTREMITIES: NO CYANOSIS, CLUBBING, OR EDEMA.  NO GROSS DEFORMITIES, MOVES ALL 4 EXTREMITIES  SKIN: WARM AND DRY.  NEUROLOGIC: CRANIAL NERVES II THROUGH XII ARE GROSSLY INTACT ALTHOUGH NOT INDIVIDUALLY TESTED.  No gross motor deficits  PSYCHIATRIC: JUDGMENT AND INSIGHT ARE SEEMINGLY INTACT. MOOD AND AFFECT ARE APPROPRIATE FOR THE SITUATION.    PROCEDURES    DIAGNOSTICS  Labs:  Labs listed below were reviewed and interpreted by me.  Results for orders placed or performed during the hospital encounter of 10/07/22   COMPREHENSIVE METABOLIC PANEL, NON-FASTING   Result Value Ref Range    SODIUM 137 136 - 145 mmol/L    POTASSIUM 3.9 3.5 - 5.1 mmol/L    CHLORIDE 104 98 - 107 mmol/L    CO2 TOTAL 27 21 - 31 mmol/L    ANION GAP 6 4 - 13 mmol/L    BUN 23 7 - 25 mg/dL    CREATININE 1.61 0.96 - 1.30 mg/dL    BUN/CREA RATIO 19 6 - 22    ESTIMATED GFR 44 (L) >59 mL/min/1.65m^2    ALBUMIN 3.5 3.5 - 5.7 g/dL    CALCIUM 9.8 8.6 - 04.5 mg/dL    GLUCOSE 409 (H) 74 - 109 mg/dL    ALKALINE PHOSPHATASE 107 (H) 34 - 104 U/L    ALT (SGPT)  21 7 - 52 U/L    AST (SGOT) 27 13 - 39 U/L    BILIRUBIN TOTAL 0.8 0.3 - 1.2 mg/dL    PROTEIN TOTAL 6.2 (L) 6.4 - 8.9 g/dL    ALBUMIN/GLOBULIN RATIO 1.3 0.8 - 1.4    OSMOLALITY, CALCULATED 280 270 - 290 mOsm/kg    CALCIUM, CORRECTED 10.2 8.9 - 10.8 mg/dL    GLOBULIN 2.7 (L) 2.9 - 5.4   LIPASE   Result Value Ref Range    LIPASE 57 11 - 82 U/L   PT/INR   Result Value Ref Range    PROTHROMBIN TIME 14.8 (H) 9.8 - 12.7 seconds    INR 1.26 (H) 0.84 - 1.10   MAGNESIUM   Result Value Ref Range    MAGNESIUM 1.0 (L) 1.9 - 2.7 mg/dL   THYROID STIMULATING HORMONE (SENSITIVE TSH)   Result Value Ref Range    TSH 5.036 0.450 - 5.330 uIU/mL   CBC WITH DIFF   Result Value Ref Range    WBC 2.6 (L) 3.8 - 11.8 x10^3/uL    RBC 3.59 (L) 3.63 - 4.92 x10^6/uL    HGB 10.7 (L) 10.9 - 14.3 g/dL    HCT 81.1 91.4 - 78.2 %    MCV 88.4 75.5 - 95.3 fL    MCH 29.7 24.7 - 32.8 pg    MCHC 33.6 32.3 - 35.6 g/dL    RDW 95.6 21.3 - 08.6 %    PLATELETS 125 (L) 140 - 440 x10^3/uL    MPV 9.7 7.9 - 10.8 fL    NEUTROPHIL % 65 43 - 77 %    LYMPHOCYTE % 32 16 - 44 %    MONOCYTE % 2 (L) 5 - 13 %    EOSINOPHIL % 1 %    BASOPHIL % 0 0 - 1 %    NEUTROPHIL # 1.70 (L) 1.85 - 7.80 x10^3/uL    LYMPHOCYTE # 0.80 (L) 1.00 - 3.00 x10^3/uL    MONOCYTE # 0.00 (L) 0.30 - 1.00 x10^3/uL    EOSINOPHIL # 0.00 0.00 - 0.50 x10^3/uL    BASOPHIL # 0.00 0.00 - 0.10  x10^3/uL   TROPONIN NOW   Result Value Ref Range    TROPONIN I 4 <15 ng/L   ECG 12 LEAD   Result Value Ref Range    Ventricular rate 91 BPM    Atrial Rate 91 BPM    PR Interval 182 ms    QRS Duration 88 ms    QT Interval 350 ms    QTC Calculation 430 ms    Calculated P Axis 61 degrees    Calculated R Axis 5 degrees    Calculated T Axis 53 degrees     Radiology:  Results for orders placed or performed during the hospital encounter of 10/07/22   CT ABDOMEN PELVIS W IV CONTRAST     Status: None    Narrative    Kenzie Sedano    RADIOLOGIST: Markus Jarvis, MD    CT ABDOMEN PELVIS W IV CONTRAST performed on 10/07/2022  6:58 PM    CLINICAL HISTORY: Abdominal Pain.  abdominal pain Hx hysterectomy, cholecystectomy    TECHNIQUE:  Abdomen and pelvis CT with intravenous contrast.  IV CONTRAST: 75 ml's of Omnipaque 350    COMPARISON:  None.    FINDINGS:  Lung bases: Clear    Liver:   Unremarkable.    Gallbladder:   Surgically absent.    Spleen:   Unremarkable.    Pancreas:   Unremarkable.    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    Bladder:  Unremarkable.    Uterus and Adnexa:  Prior hysterectomy.  Adnexal regions are unremarkable.    Bowel:   Stomach and duodenum are fluid-filled and mildly distended.    Appendix:  Normal.    Lymph nodes:  No suspicious lymph node enlargement.    Vasculature:   Major vascular structures are unremarkable.     Peritoneum / Retroperitoneum: No ascites.  No free air.    Bones:   Unremarkable.        Impression    MILDLY DILATED STOMACH AND DUODENUM WHICH ARE FLUID-FILLED. ENTERITIS IS SUSPECTED. CORRELATE CLINICALLY.       One or more dose reduction techniques were used (e.g., Automated exposure control, adjustment of the mA and/or kV according to patient size, use of iterative reconstruction technique).      Radiologist location ID: [id number]     XR AP MOBILE CHEST     Status: None    Narrative    Gabbi Mineo    RADIOLOGIST: Lucien Mons    XR AP MOBILE CHEST performed on 10/07/2022 7:23 PM    CLINICAL HISTORY: Tachycardia.  Tachycardia    TECHNIQUE: Frontal view of the chest.    COMPARISON:  Chest radiograph dated 07/11/2021    FINDINGS:  A right subclavian port catheter terminates in the lower SVC.   The heart size is normal.   The lungs are clear.           Impression    NO ACUTE FINDINGS.        Radiologist location ID: [id number]         ED COURSE/MEDICAL DECISION MAKING  Medications Administered in the ED   magnesium sulfate 1 G in D5W 100 mL premix IVPB (has no administration in time range)   NS bolus infusion 1,000 mL (has no  administration in time range)   NS bolus infusion 1,000 mL (1,000 mL Intravenous New Bag/New Syringe 10/07/22 1810)   ondansetron (ZOFRAN) 2  mg/mL injection (4 mg Intravenous Given 10/07/22 1817)   iohexol (OMNIPAQUE 350) infusion (75 mL Intravenous Given 10/07/22 1859)          Medical Decision Making  82 year old female being treated for breast cancer.  Complain of abdominal pain diarrhea.  EMS was called during transferred she became bradycardic and hypotensive.  Differential certainly could have been a primary cardiac event.  But I suspect this was more vasovagal secondary to abdominal discomfort.  She was hypomagnesemia.  I think this patient likely should be observed overnight.  At this time CT results are pending x-ray results are pending.  We will sign out to Dr. Hyacinth Meeker at 7:30 p.m. for admission to the hospitalist    1910 patient looks much better than she did on initial presentation.  Nevertheless I do believe she will need at least an observation tonight.    Amount and/or Complexity of Data Reviewed  ECG/medicine tests: independent interpretation performed.     Details: Normal sinus rhythm with a rate of 91 nonspecific ST changes normal axis normal intervals no STEMI      CRITICAL CARE    CLINICAL IMPRESSION  Clinical Impression   Abdominal pain (Primary)   Hypomagnesemia   Bradycardia     DISPOSITION  Admitted       DISCHARGE MEDICATIONS  Current Discharge Medication List          //Doug Kinnie Scales M.D.   10/07/2022, 17:38   [facility]  Department of Emergency Medicine  [facility]    This note was partially generated using MModal Fluency Direct system, and there may be some incorrect words, spellings, and punctuation that were not noted in checking the note before saving.    -----

## 2022-10-07 NOTE — ED Nurses Note (Signed)
Transferring to room 430 report called to [facility]

## 2022-10-07 NOTE — H&P (Signed)
[facility]  History and Physical    Date of Service:  10/07/2022  Kelsey Preston, Kelsey Preston, 82 y.o. female  Encounter Start Date:  10/07/2022  Inpatient Admission Date: 10/07/2022  Date of Birth:  [birth year]  PCP: Charm Rings, MD       Chief Complaint:  Abdominal Pain    HPI: Kelsey Preston is a 82 y.o., White female who presents with abdominal pain.  Patient was seen and examined at bedside.  Family was at bedside as well.  Patient states she started having abdominal pain this evening around 4:00 p.m.Marland Kitchen  Patient states that the pain was so severe she felt as if she was going to double over.  Upon EMS arrival, patient bradycardic as well as hypotensive.  Patient has also been having abdominal pain, nausea, and diaphoresis.  EMS gave 1 dose of atropine and patient is now back to baseline.  Patient does have history of breast cancer and had her last treatment on Friday.  Patient had CT abdomen with IV contrast that showed mildly dilated stomach and duodenum which are fluid-filled.  Enteritis suspected per radiologist's impression.  Patient states she feels back to baseline at this time.  Patient denies any current abdominal pain for any of her previous symptoms.  Patient will be admitted to hospitalist services for further evaluation.    Past Medical History:    Past Medical History:   Diagnosis Date    Diabetes mellitus, type 2 (CMS HCC)     Embolism (CMS HCC)     Endometrial cancer (CMS HCC)     HTN (hypertension)     Lung cancer (CMS HCC)     Macular degeneration (senile) of retina              Medications Prior to Admission       Prescriptions    atorvastatin (LIPITOR) 40 mg Oral Tablet    Take 1 Tablet (40 mg total) by mouth Every evening for 30 days    esomeprazole magnesium (NEXIUM) 40 mg Oral Capsule, Delayed Release(E.C.)    Take 1 Capsule (40 mg total) by mouth Every morning before breakfast    ezetimibe (ZETIA) 10 mg Oral Tablet    Take 1 Tablet (10 mg total) by mouth Every evening    famotidine  (PEPCID) 40 mg Oral Tablet    Take 1 Tablet (40 mg total) by mouth Once a day    hydroCHLOROthiazide (HYDRODIURIL) 25 mg Oral Tablet    Take 1 Tablet (25 mg total) by mouth Once a day    lidocaine-prilocaine (EMLA) 2.5-2.5 % Cream    Apply topically One time for 1 dose    metFORMIN (GLUCOPHAGE XR) 500 mg Oral Tablet Sustained Release 24 hr    Take 1 Tablet (500 mg total) by mouth Twice daily    multivitamin with iron Oral Tablet    Take 1 Tablet by mouth Once a day    ondansetron (ZOFRAN ODT) 4 mg Oral Tablet, Rapid Dissolve    Take 1 Tablet (4 mg total) by mouth Every 8 hours as needed for Nausea/Vomiting Indications: prevent nausea and vomiting from cancer chemotherapy    rivaroxaban (XARELTO) 20 mg Oral Tablet    Take 1 Tablet (20 mg total) by mouth Every evening with dinner for 120 days    sertraline (ZOLOFT) 50 mg Oral Tablet    Take 1 Tablet (50 mg total) by mouth Once a day    telmisartan (MICARDIS) 80 mg Oral Tablet  Take 1 Tablet (80 mg total) by mouth Once a day    traMADoL (ULTRAM) 50 mg Oral Tablet    Take 1 Tablet (50 mg total) by mouth Every 4 hours as needed for Pain    traMADoL (ULTRAM) 50 mg Oral Tablet    Take 1 Tablet (50 mg total) by mouth Every 4 hours as needed for Pain          Allergies   Allergen Reactions    Adhesive Rash    Ceclor [Cefaclor] Rash       Past Surgical History:  Past Surgical History:   Procedure Laterality Date    HX CHOLECYSTECTOMY      HX COLONOSCOPY      HX HYSTERECTOMY      HX LOBECTOMY      LUNG CANCER SURGERY      PORTACATH PLACEMENT             Family History:  Family Medical History:       Problem Relation (Age of Onset)    Cancer Brother               Social History:  Social History     Tobacco Use    Smoking status: Never    Smokeless tobacco: Never   Vaping Use    Vaping status: Never Used   Substance Use Topics    Alcohol use: Never    Drug use: Never        Review of Systems:  All systems are reviewed and are negative except those mentioned in the HPI  portion    Examination:  BP 120/80   Pulse (!) 121   Temp 37 C (98.6 F)   Resp 20   Ht 1.626 m (5\' 4" )   Wt 104 kg (230 lb)   SpO2 98%   BMI 39.48 kg/m         General: Patient is alert and oriented to person, place and time.    HEENT: Pupils are of round shape, equal in size, and reactive to light bilaterally. Oral mucous membranes are moist.    Heart: S1 and S2 are present. No appreciable murmur.    Lungs: Breath sounds are appreciated at all posterior lung fields, no appreciable crackles, wheezes, or rhonchi.    Gastrointestinal: Bowel sounds are appreciated at all 4 quadrants. Abdomen is soft, not appreciably distended, non-tender to palpation at all quadrants.    Extremities: Radial pulses are 3/4 bilaterally, dorsalis pedis pulses are 3/4 bilaterally. Capillary refill is less than 3 seconds at distal digits bilaterally. No appreciable edema of the lower extremities.    Genitourinary: No appreciable suprapubic tenderness.    Neurologic: Follows commands appropriately. No appreciable facial droop. No appreciable focal weakness of the bilateral upper or lower extremities.    Skin: Grossly intact at observable areas.    Labs:    Lab Results Today:    Results for orders placed or performed during the hospital encounter of 10/07/22 (from the past 24 hour(s))   COMPREHENSIVE METABOLIC PANEL, NON-FASTING   Result Value Ref Range    SODIUM 137 136 - 145 mmol/L    POTASSIUM 3.9 3.5 - 5.1 mmol/L    CHLORIDE 104 98 - 107 mmol/L    CO2 TOTAL 27 21 - 31 mmol/L    ANION GAP 6 4 - 13 mmol/L    BUN 23 7 - 25 mg/dL    CREATININE 5.78 4.69 - 1.30 mg/dL    BUN/CREA RATIO  19 6 - 22    ESTIMATED GFR 44 (L) >59 mL/min/1.37m^2    ALBUMIN 3.5 3.5 - 5.7 g/dL    CALCIUM 9.8 8.6 - 44.0 mg/dL    GLUCOSE 347 (H) 74 - 109 mg/dL    ALKALINE PHOSPHATASE 107 (H) 34 - 104 U/L    ALT (SGPT) 21 7 - 52 U/L    AST (SGOT) 27 13 - 39 U/L    BILIRUBIN TOTAL 0.8 0.3 - 1.2 mg/dL    PROTEIN TOTAL 6.2 (L) 6.4 - 8.9 g/dL    ALBUMIN/GLOBULIN RATIO  1.3 0.8 - 1.4    OSMOLALITY, CALCULATED 280 270 - 290 mOsm/kg    CALCIUM, CORRECTED 10.2 8.9 - 10.8 mg/dL    GLOBULIN 2.7 (L) 2.9 - 5.4   LIPASE   Result Value Ref Range    LIPASE 57 11 - 82 U/L   MAGNESIUM   Result Value Ref Range    MAGNESIUM 1.0 (L) 1.9 - 2.7 mg/dL   THYROID STIMULATING HORMONE (SENSITIVE TSH)   Result Value Ref Range    TSH 5.036 0.450 - 5.330 uIU/mL   CBC WITH DIFF   Result Value Ref Range    WBC 2.6 (L) 3.8 - 11.8 x10^3/uL    RBC 3.59 (L) 3.63 - 4.92 x10^6/uL    HGB 10.7 (L) 10.9 - 14.3 g/dL    HCT 42.5 95.6 - 38.7 %    MCV 88.4 75.5 - 95.3 fL    MCH 29.7 24.7 - 32.8 pg    MCHC 33.6 32.3 - 35.6 g/dL    RDW 56.4 33.2 - 95.1 %    PLATELETS 125 (L) 140 - 440 x10^3/uL    MPV 9.7 7.9 - 10.8 fL    NEUTROPHIL % 65 43 - 77 %    LYMPHOCYTE % 32 16 - 44 %    MONOCYTE % 2 (L) 5 - 13 %    EOSINOPHIL % 1 %    BASOPHIL % 0 0 - 1 %    NEUTROPHIL # 1.70 (L) 1.85 - 7.80 x10^3/uL    LYMPHOCYTE # 0.80 (L) 1.00 - 3.00 x10^3/uL    MONOCYTE # 0.00 (L) 0.30 - 1.00 x10^3/uL    EOSINOPHIL # 0.00 0.00 - 0.50 x10^3/uL    BASOPHIL # 0.00 0.00 - 0.10 x10^3/uL   TROPONIN NOW   Result Value Ref Range    TROPONIN I 4 <15 ng/L   PT/INR   Result Value Ref Range    PROTHROMBIN TIME 14.8 (H) 9.8 - 12.7 seconds    INR 1.26 (H) 0.84 - 1.10   ECG 12 LEAD   Result Value Ref Range    Ventricular rate 91 BPM    Atrial Rate 91 BPM    PR Interval 182 ms    QRS Duration 88 ms    QT Interval 350 ms    QTC Calculation 430 ms    Calculated P Axis 61 degrees    Calculated R Axis 5 degrees    Calculated T Axis 53 degrees   TROPONIN IN ONE HOUR   Result Value Ref Range    TROPONIN I 4 <15 ng/L   LACTIC ACID LEVEL W/ REFLEX FOR LEVEL >2.0   Result Value Ref Range    LACTIC ACID 1.2 0.5 - 2.2 mmol/L       Imaging Studies:  Results for orders placed or performed during the hospital encounter of 10/07/22 (from the past 24 hour(s))   CT ABDOMEN PELVIS W IV  CONTRAST     Status: None    Narrative    Renarda Cartier    RADIOLOGIST: Markus Jarvis, MD    CT ABDOMEN PELVIS W IV CONTRAST performed on 10/07/2022 6:58 PM    CLINICAL HISTORY: Abdominal Pain.  abdominal pain Hx hysterectomy, cholecystectomy    TECHNIQUE:  Abdomen and pelvis CT with intravenous contrast.  IV CONTRAST: 75 ml's of Omnipaque 350    COMPARISON:  None.    FINDINGS:  Lung bases: Clear    Liver:   Unremarkable.    Gallbladder:   Surgically absent.    Spleen:   Unremarkable.    Pancreas:   Unremarkable.    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    Bladder:  Unremarkable.    Uterus and Adnexa:  Prior hysterectomy.  Adnexal regions are unremarkable.    Bowel:   Stomach and duodenum are fluid-filled and mildly distended.    Appendix:  Normal.    Lymph nodes:  No suspicious lymph node enlargement.    Vasculature:   Major vascular structures are unremarkable.     Peritoneum / Retroperitoneum: No ascites.  No free air.    Bones:   Unremarkable.        Impression    MILDLY DILATED STOMACH AND DUODENUM WHICH ARE FLUID-FILLED. ENTERITIS IS SUSPECTED. CORRELATE CLINICALLY.       One or more dose reduction techniques were used (e.g., Automated exposure control, adjustment of the mA and/or kV according to patient size, use of iterative reconstruction technique).      Radiologist location ID: [id number]     XR AP MOBILE CHEST     Status: None    Narrative    [place]    RADIOLOGIST: Lucien Mons    XR AP MOBILE CHEST performed on 10/07/2022 7:23 PM    CLINICAL HISTORY: Tachycardia.  Tachycardia    TECHNIQUE: Frontal view of the chest.    COMPARISON:  Chest radiograph dated 07/11/2021    FINDINGS:  A right subclavian port catheter terminates in the lower SVC.   The heart size is normal.   The lungs are clear.           Impression    NO ACUTE FINDINGS.        Radiologist location ID: [id number]          Assessment/Plan:   Active Hospital Problems    Diagnosis    Primary Problem: Abdominal pain     Abdominal Pain  -IV NS @ 75  cc/hr  -IV Zofran and IV Compazine will be ordered for N/V PRN  -clear liquid diet   -repeat labs in a.m.     Plan to admit patient MIP.  Telemetry, continuous pulse ox, and supplemental oxygen titration p.r.n. will be ordered.  Will monitor blood pressure and heart rate.  Will verify patient's home medications.  Plan repeat labs in a.m.Marland Kitchen  See attending addendum and orders for further information.    DVT/PE Prophylaxis: SCDs/ Venodynes/Impulse boots and Heparin    Laurita Quint, FNP-BC    Contents of the document, in whole or in part, are completed utilizing M*Modal dictation technology, please forgive any typographical errors that may exist.

## 2022-10-07 NOTE — ED Triage Notes (Signed)
Abd pain with nausea and diaphoresis. Became hypotensive in the truck, gave one dose of atropine and patient is now back to baseline. Breast cancer patient, had last treatment on Friday. 18G L wrist. 150 NS given. 4mg  Zofran also given. ACC 154

## 2022-10-08 DIAGNOSIS — I10 Essential (primary) hypertension: Secondary | ICD-10-CM

## 2022-10-08 DIAGNOSIS — K529 Noninfective gastroenteritis and colitis, unspecified: Principal | ICD-10-CM

## 2022-10-08 DIAGNOSIS — E119 Type 2 diabetes mellitus without complications: Secondary | ICD-10-CM

## 2022-10-08 LAB — CBC WITH DIFF
BASOPHIL #: 0 10*3/uL (ref 0.00–0.10)
BASOPHIL %: 1 % (ref 0–1)
EOSINOPHIL #: 0 10*3/uL (ref 0.00–0.50)
EOSINOPHIL %: 0 % — ABNORMAL LOW
HCT: 30.2 % — ABNORMAL LOW (ref 31.2–41.9)
HGB: 10.3 g/dL — ABNORMAL LOW (ref 10.9–14.3)
LYMPHOCYTE #: 0.4 10*3/uL — ABNORMAL LOW (ref 1.00–3.00)
LYMPHOCYTE %: 12 % — ABNORMAL LOW (ref 16–44)
MCH: 29.9 pg (ref 24.7–32.8)
MCHC: 34.1 g/dL (ref 32.3–35.6)
MCV: 87.6 fL (ref 75.5–95.3)
MONOCYTE #: 0.1 10*3/uL — ABNORMAL LOW (ref 0.30–1.00)
MONOCYTE %: 2 % — ABNORMAL LOW (ref 5–13)
MPV: 9.4 fL (ref 7.9–10.8)
NEUTROPHIL #: 2.7 10*3/uL (ref 1.85–7.80)
NEUTROPHIL %: 86 % — ABNORMAL HIGH (ref 43–77)
PLATELETS: 119 10*3/uL — ABNORMAL LOW (ref 140–440)
RBC: 3.45 10*6/uL — ABNORMAL LOW (ref 3.63–4.92)
RDW: 13.4 % (ref 12.3–17.7)
WBC: 3.2 10*3/uL — ABNORMAL LOW (ref 3.8–11.8)

## 2022-10-08 LAB — BASIC METABOLIC PANEL
ANION GAP: 7 mmol/L (ref 4–13)
BUN/CREA RATIO: 18 (ref 6–22)
BUN: 19 mg/dL (ref 7–25)
CALCIUM: 9.4 mg/dL (ref 8.6–10.3)
CHLORIDE: 105 mmol/L (ref 98–107)
CO2 TOTAL: 24 mmol/L (ref 21–31)
CREATININE: 1.06 mg/dL (ref 0.60–1.30)
ESTIMATED GFR: 53 mL/min/{1.73_m2} — ABNORMAL LOW (ref >59)
GLUCOSE: 154 mg/dL — ABNORMAL HIGH (ref 74–109)
OSMOLALITY, CALCULATED: 277 mOsm/kg (ref 270–290)
POTASSIUM: 3.7 mmol/L (ref 3.5–5.1)
SODIUM: 136 mmol/L (ref 136–145)

## 2022-10-08 LAB — MAGNESIUM: MAGNESIUM: 1.4 mg/dL — ABNORMAL LOW (ref 1.9–2.7)

## 2022-10-08 MED ORDER — ATORVASTATIN 40 MG TABLET
40.0000 mg | ORAL_TABLET | Freq: Every evening | ORAL | Status: DC
Start: 2022-10-08 — End: 2022-10-09
  Administered 2022-10-08: 40 mg via ORAL
  Filled 2022-10-08: qty 1

## 2022-10-08 MED ORDER — SODIUM CHLORIDE 0.9 % (FLUSH) INJECTION SYRINGE
10.0000 mL | INJECTION | Freq: Three times a day (TID) | INTRAMUSCULAR | Status: DC
Start: 2022-10-08 — End: 2022-10-09
  Administered 2022-10-08 (×2): 10 mL via INTRAVENOUS
  Administered 2022-10-09: 0 mL via INTRAVENOUS
  Administered 2022-10-09 (×2): 10 mL via INTRAVENOUS

## 2022-10-08 MED ORDER — RAMELTEON 8 MG TABLET
8.0000 mg | ORAL_TABLET | Freq: Once | ORAL | Status: AC
Start: 2022-10-08 — End: 2022-10-08
  Administered 2022-10-08: 8 mg via ORAL
  Filled 2022-10-08: qty 1

## 2022-10-08 MED ORDER — SODIUM CHLORIDE 0.9 % INTRAVENOUS PIGGYBACK
4.5000 g | INTRAVENOUS | Status: AC
Start: 2022-10-08 — End: 2022-10-08
  Administered 2022-10-08: 0 g via INTRAVENOUS
  Administered 2022-10-08: 4.5 g via INTRAVENOUS
  Filled 2022-10-08: qty 20

## 2022-10-08 MED ORDER — SODIUM CHLORIDE 0.9 % INTRAVENOUS PIGGYBACK
4.5000 g | Freq: Three times a day (TID) | INTRAVENOUS | Status: DC
Start: 2022-10-08 — End: 2022-10-09
  Administered 2022-10-08 – 2022-10-09 (×3): 4.5 g via INTRAVENOUS
  Administered 2022-10-09 (×3): 0 g via INTRAVENOUS
  Filled 2022-10-08 (×3): qty 20

## 2022-10-08 MED ORDER — ETHYL ALCOHOL 62 % (NOZIN NASAL SANITIZER) NASAL SOLUTION - BULK BOTTLE
1.0000 | Freq: Two times a day (BID) | NASAL | Status: DC
Start: 2022-10-08 — End: 2022-10-09
  Administered 2022-10-08 – 2022-10-09 (×4): 1 via NASAL

## 2022-10-08 MED ORDER — LACTATED RINGERS INTRAVENOUS SOLUTION
INTRAVENOUS | Status: DC
Start: 2022-10-08 — End: 2022-10-09

## 2022-10-08 MED ORDER — PANTOPRAZOLE 40 MG TABLET,DELAYED RELEASE
40.0000 mg | DELAYED_RELEASE_TABLET | Freq: Every day | ORAL | Status: DC
Start: 2022-10-09 — End: 2022-10-09
  Administered 2022-10-09: 40 mg via ORAL
  Filled 2022-10-08: qty 1

## 2022-10-08 NOTE — Care Plan (Signed)
Problem: Adult Inpatient Plan of Care  Goal: Plan of Care Review  Outcome: Ongoing (see interventions/notes)  Goal: Patient-Specific Goal (Individualized)  Outcome: Ongoing (see interventions/notes)  Flowsheets (Taken 10/08/2022 0842)  Individualized Care Needs: assist as needed  Anxieties, Fears or Concerns: none voiced  Patient-Specific Goals (Include Timeframe): discharge  Plan of Care Reviewed With: patient  Goal: Absence of Hospital-Acquired Illness or Injury  Outcome: Ongoing (see interventions/notes)  Intervention: Identify and Manage Fall Risk  Recent Flowsheet Documentation  Taken 10/08/2022 0842 by Daryel Gerald, RN  Safety Promotion/Fall Prevention: fall prevention program maintained  Intervention: Prevent Infection  Recent Flowsheet Documentation  Taken 10/08/2022 0842 by Daryel Gerald, RN  Infection Prevention: promote handwashing  Goal: Optimal Comfort and Wellbeing  Outcome: Ongoing (see interventions/notes)  Goal: Rounds/Family Conference  Outcome: Ongoing (see interventions/notes)     Problem: Fall Injury Risk  Goal: Absence of Fall and Fall-Related Injury  Outcome: Ongoing (see interventions/notes)  Intervention: Identify and Manage Contributors  Recent Flowsheet Documentation  Taken 10/08/2022 0842 by Daryel Gerald, RN  Medication Review/Management: medications reviewed  Intervention: Promote Injury-Free Environment  Recent Flowsheet Documentation  Taken 10/08/2022 7425 by Daryel Gerald, RN  Safety Promotion/Fall Prevention: fall prevention program maintained     Problem: Skin Injury Risk Increased  Goal: Skin Health and Integrity  Outcome: Ongoing (see interventions/notes)     Problem: Health Knowledge, Opportunity to Enhance (Adult,Obstetrics,Pediatric)  Goal: Knowledgeable about Health Subject/Topic  Description: Patient will demonstrate the desired outcomes by discharge/transition of care.  Outcome: Ongoing (see interventions/notes)

## 2022-10-08 NOTE — Progress Notes (Signed)
[facility]   Progress Note    Kelsey Preston  Date of service: 10/08/2022  Date of Admission:  10/07/2022  Hospital Day:  LOS: 1 day     Subjective: feeling better, denies Nausea/vomiting    Review of Systems:    Review of Systems as above    Objective:      Vital Signs:  Vitals:    10/07/22 2305 10/08/22 0031 10/08/22 0414 10/08/22 1130   BP: (!) 156/99  105/61    Pulse: (!) 101  (!) 117 80   Resp: 18  18    Temp: 36.2 C (97.2 F) 36.1 C (97 F) 37.4 C (99.4 F)    SpO2: 98%  96%    Weight: 98.6 kg (217 lb 6 oz)      Height: 1.626 m (5\' 4" )      BMI: 37.39               I/O:  I/O last 24 hours:    Intake/Output Summary (Last 24 hours) at 10/08/2022 1920  Last data filed at 10/08/2022 1800  Gross per 24 hour   Intake 2780 ml   Output --   Net 2780 ml         alcohol 62 % (NOZIN NASAL SANITIZER) nasal solution, 1 Each, Each Nostril, 2x/day  heparin 5,000 unit/mL injection, 5,000 Units, Subcutaneous, Q8HRS  LR premix infusion, , Intravenous, Continuous  NS flush syringe, 10 mL, Intravenous, Q8H  NS premix infusion, , Intravenous, Continuous  ondansetron (ZOFRAN) 2 mg/mL injection, 4 mg, Intravenous, Q6H PRN  piperacillin-tazobactam (ZOSYN) 4.5 g in NS 100 mL IVPB minibag, 4.5 g, Intravenous, Q8H  prochlorperazine (COMPAZINE) 5 mg/mL injection, 10 mg, Intravenous, Q6H PRN        Physical Exam:    Physical Exam  Vitals and nursing note reviewed.   Constitutional:       General: Kelsey Preston is not in acute distress.     Appearance: Kelsey Preston is well-developed.   HENT:      Head: Normocephalic and atraumatic.   Eyes:      Conjunctiva/sclera: Conjunctivae normal.   Cardiovascular:      Rate and Rhythm: Normal rate and regular rhythm.      Heart sounds: No murmur heard.  Pulmonary:      Effort: Pulmonary effort is normal. No respiratory distress.      Breath sounds: Normal breath sounds.   Abdominal:      Palpations: Abdomen is soft.      Tenderness: There is no abdominal tenderness.   Musculoskeletal:         General:  No swelling.      Cervical back: Neck supple.   Skin:     General: Skin is warm and dry.      Capillary Refill: Capillary refill takes less than 2 seconds.   Neurological:      Mental Status: Kelsey Preston is alert.   Psychiatric:         Mood and Affect: Mood normal.         Labs:  Results for orders placed or performed during the hospital encounter of 10/07/22 (from the past 24 hour(s))   TROPONIN IN THREE HOURS   Result Value Ref Range    TROPONIN I 5 <15 ng/L   URINALYSIS, MACROSCOPIC AND MICROSCOPIC W/CULTURE REFLEX [PRN ONLY]    Specimen: Urine, Clean Catch    Narrative    The following orders were created for panel order URINALYSIS, MACROSCOPIC AND MICROSCOPIC  W/CULTURE REFLEX [PRN ONLY].  Procedure                               Abnormality         Status                     ---------                               -----------         ------                     URINALYSIS, MACROSCOPIC[[id number]]      Abnormal            Final result               URINALYSIS, MICROSCOPIC[[id number]]      Abnormal            Final result                 Please view results for these tests on the individual orders.   URINALYSIS, MACROSCOPIC   Result Value Ref Range    COLOR Light Yellow Colorless, Light Yellow, Yellow    APPEARANCE Clear Clear    SPECIFIC GRAVITY 1.043 (H) 1.002 - 1.030    PH 5.5 5.0 - 9.0    LEUKOCYTES Negative Negative, 100  WBCs/uL    NITRITE Negative Negative    PROTEIN Negative Negative, 10 , 20  mg/dL    GLUCOSE Negative Negative, 30  mg/dL    KETONES Negative Negative, Trace mg/dL    BILIRUBIN Negative Negative, 0.5 mg/dL    BLOOD Negative Negative, 0.03 mg/dL    UROBILINOGEN Normal Normal mg/dL   URINALYSIS, MICROSCOPIC   Result Value Ref Range    MUCOUS Rare (A) (none) /hpf    RBCS 2 <4 /hpf    WBCS 1 <6 /hpf    SQUAMOUS EPITHELIAL 6 <28 /hpf    RENAL EPITHELIAL CELLS URINE <1 <6 /hpf    TRANSITIONAL EPITHELIAL CELLS URINE <1 <6 /hpf   CBC/DIFF    Narrative    The following orders were created for panel order  CBC/DIFF.  Procedure                               Abnormality         Status                     ---------                               -----------         ------                     CBC WITH DIFF[[id number]]                Abnormal            Final result                 Please view results for these tests on the individual orders.   BASIC METABOLIC PANEL, NON-FASTING   Result Value Ref Range    SODIUM 136 136 - 145  mmol/L    POTASSIUM 3.7 3.5 - 5.1 mmol/L    CHLORIDE 105 98 - 107 mmol/L    CO2 TOTAL 24 21 - 31 mmol/L    ANION GAP 7 4 - 13 mmol/L    CALCIUM 9.4 8.6 - 10.3 mg/dL    GLUCOSE 161 (H) 74 - 109 mg/dL    BUN 19 7 - 25 mg/dL    CREATININE 0.96 0.45 - 1.30 mg/dL    BUN/CREA RATIO 18 6 - 22    ESTIMATED GFR 53 (L) >59 mL/min/1.67m^2    OSMOLALITY, CALCULATED 277 270 - 290 mOsm/kg    Narrative    Estimated Glomerular Filtration Rate (eGFR) is calculated using the CKD-EPI (2021) equation, intended for patients 99 years of age and older. If gender is not documented or "unknown", there will be no eGFR calculation.     MAGNESIUM   Result Value Ref Range    MAGNESIUM 1.4 (L) 1.9 - 2.7 mg/dL   CBC WITH DIFF   Result Value Ref Range    WBC 3.2 (L) 3.8 - 11.8 x10^3/uL    RBC 3.45 (L) 3.63 - 4.92 x10^6/uL    HGB 10.3 (L) 10.9 - 14.3 g/dL    HCT 40.9 (L) 81.1 - 41.9 %    MCV 87.6 75.5 - 95.3 fL    MCH 29.9 24.7 - 32.8 pg    MCHC 34.1 32.3 - 35.6 g/dL    RDW 91.4 78.2 - 95.6 %    PLATELETS 119 (L) 140 - 440 x10^3/uL    MPV 9.4 7.9 - 10.8 fL    NEUTROPHIL % 86 (H) 43 - 77 %    LYMPHOCYTE % 12 (L) 16 - 44 %    MONOCYTE % 2 (L) 5 - 13 %    EOSINOPHIL % 0 (L) %    BASOPHIL % 1 0 - 1 %    NEUTROPHIL # 2.70 1.85 - 7.80 x10^3/uL    LYMPHOCYTE # 0.40 (L) 1.00 - 3.00 x10^3/uL    MONOCYTE # 0.10 (L) 0.30 - 1.00 x10^3/uL    EOSINOPHIL # 0.00 0.00 - 0.50 x10^3/uL    BASOPHIL # 0.00 0.00 - 0.10 x10^3/uL            Imaging:    Results for orders placed or performed during the hospital encounter of 10/07/22 (from the past 24 hour(s))    XR AP MOBILE CHEST     Status: None    Narrative    Jackline Graef    RADIOLOGIST: Lucien Mons    XR AP MOBILE CHEST performed on 10/07/2022 7:23 PM    CLINICAL HISTORY: Tachycardia.  Tachycardia    TECHNIQUE: Frontal view of the chest.    COMPARISON:  Chest radiograph dated 07/11/2021    FINDINGS:  A right subclavian port catheter terminates in the lower SVC.   The heart size is normal.   The lungs are clear.           Impression    NO ACUTE FINDINGS.        Radiologist location ID: [id number]           Microbiology:  No results found for any visits on 10/07/22 (from the past 96 hour(s)).        Assessment/ Plan:   Active Hospital Problems    Diagnosis    Primary Problem: Abdominal pain      # gastroenteritis  - improved  - tolerating diet  - d/c zosyn tomorrow  -  advance diet    # hypertension  - holding home meds for now, resume as tolerated    # DM  - Insulin sliding scale  - Lipitor      DVT Prophylaxis:   GI Prophylaxis:    Disposition Planning:   Home likely d/c tomorrow    Genella Mech, MD    This note was partially generated using MModal Fluency Direct system, and there may be some incorrect words, spellings, and punctuation that were not noted in checking the note before saving.

## 2022-10-09 ENCOUNTER — Encounter (INDEPENDENT_AMBULATORY_CARE_PROVIDER_SITE_OTHER): Payer: Self-pay | Admitting: HEMATOLOGY-ONCOLOGY

## 2022-10-09 DIAGNOSIS — R9431 Abnormal electrocardiogram [ECG] [EKG]: Secondary | ICD-10-CM

## 2022-10-09 LAB — ECG 12 LEAD
Atrial Rate: 91 {beats}/min
Calculated P Axis: 61 degrees
Calculated R Axis: 5 degrees
Calculated T Axis: 53 degrees
PR Interval: 182 ms
QRS Duration: 88 ms
QT Interval: 350 ms
QTC Calculation: 430 ms
Ventricular rate: 91 {beats}/min

## 2022-10-09 MED ORDER — METRONIDAZOLE 500 MG TABLET
500.0000 mg | ORAL_TABLET | Freq: Two times a day (BID) | ORAL | 0 refills | Status: DC
Start: 2022-10-09 — End: 2022-10-18

## 2022-10-09 MED ORDER — LIDOCAINE-PRILOCAINE 2.5 %-2.5 % TOPICAL CREAM
TOPICAL_CREAM | Freq: Once | CUTANEOUS | 3 refills | Status: AC
Start: 2022-10-09 — End: 2022-10-09

## 2022-10-09 MED ORDER — CIPROFLOXACIN 500 MG TABLET
500.0000 mg | ORAL_TABLET | Freq: Two times a day (BID) | ORAL | 0 refills | Status: DC
Start: 2022-10-09 — End: 2022-10-18

## 2022-10-09 NOTE — Discharge Summary (Signed)
[facility]  DISCHARGE SUMMARY    PATIENT NAME:  Kelsey Preston, Kelsey Preston  MRN:  [id number]  DOB:  1940/07/10    ENCOUNTER DATE:  10/07/2022  INPATIENT ADMISSION DATE: 10/07/2022  DISCHARGE DATE:  10/09/2022    ATTENDING PHYSICIAN: Genella Mech, MD  SERVICE: PRN HOSPITALIST 3  PRIMARY CARE PHYSICIAN: Charm Rings, MD       No lay caregiver identified.    PRIMARY DISCHARGE DIAGNOSIS: Abdominal pain  Active Hospital Problems    Diagnosis Date Noted    Principal Problem: Abdominal pain [R10.9] 10/07/2022      Resolved Hospital Problems   No resolved problems to display.     Active Non-Hospital Problems    Diagnosis Date Noted    Sepsis (CMS HCC) 10/07/2022    Breast cancer (CMS HCC) 09/10/2022    Axillary mass, left 07/26/2022    DVT (deep venous thrombosis) (CMS HCC) 05/31/2022    Chest pain 05/30/2022    History of lung cancer 05/30/2022    Essential hypertension 05/30/2022    GERD (gastroesophageal reflux disease) 05/30/2022    DM2 (diabetes mellitus, type 2) (CMS HCC) 05/30/2022    Acute deep vein thrombosis (DVT) of femoral vein of left lower extremity (CMS HCC) 05/30/2022           Current Discharge Medication List        START taking these medications.        Details   ciprofloxacin HCl 500 mg Tablet  Commonly known as: CIPRO   500 mg, Oral, 2 TIMES DAILY  Qty: 6 Tablet  Refills: 0     metroNIDAZOLE 500 mg Tablet  Commonly known as: FLAGYL   500 mg, Oral, 2 TIMES DAILY  Qty: 6 Tablet  Refills: 0            CONTINUE these medications - NO CHANGES were made during your visit.        Details   atorvastatin 40 mg Tablet  Commonly known as: LIPITOR   40 mg, Oral, EVERY EVENING  Qty: 30 Tablet  Refills: 0     esomeprazole magnesium 40 mg Capsule, Delayed Release(E.C.)  Commonly known as: NEXIUM   40 mg, Oral, EVERY MORNING BEFORE BREAKFAST  Refills: 0     ezetimibe 10 mg Tablet  Commonly known as: ZETIA   10 mg, Oral, EVERY EVENING  Refills: 0     famotidine 40 mg Tablet  Commonly known as: PEPCID   40 mg, Oral,  DAILY  Refills: 0     lidocaine-prilocaine 2.5-2.5 % Cream  Commonly known as: EMLA   Apply Topically, ONCE  Qty: 5 g  Refills: 3     metFORMIN 500 mg Tablet Sustained Release 24 hr  Commonly known as: GLUCOPHAGE XR   500 mg, Oral, 2 TIMES DAILY  Refills: 0     multivitamin with iron Tablet   1 Tablet, Oral, DAILY  Refills: 0     ondansetron 4 mg Tablet, Rapid Dissolve  Commonly known as: ZOFRAN ODT   4 mg, Oral, EVERY 8 HOURS PRN  Qty: 30 Tablet  Refills: 3     rivaroxaban 20 mg Tablet  Commonly known as: XARELTO   20 mg, Oral, EVERY EVENING WITH DINNER  Qty: 30 Tablet  Refills: 3     sertraline 50 mg Tablet  Commonly known as: ZOLOFT   50 mg, Oral, DAILY  Refills: 0     * traMADoL 50 mg Tablet  Commonly known as: Janean Sark  50 mg, Oral, EVERY 4 HOURS PRN  Qty: 20 Tablet  Refills: 1     * traMADoL 50 mg Tablet  Commonly known as: ULTRAM   50 mg, Oral, EVERY 4 HOURS PRN  Qty: 20 Tablet  Refills: 1           * This list has 2 medication(s) that are the same as other medications prescribed for you. Read the directions carefully, and ask your doctor or other care provider to review them with you.                STOP taking these medications.      hydroCHLOROthiazide 25 mg Tablet  Commonly known as: HYDRODIURIL     telmisartan 80 mg Tablet  Commonly known as: MICARDIS            Discharge med list refreshed?  YES     Allergies   Allergen Reactions    Adhesive Rash    Ceclor [Cefaclor]  Other Adverse Reaction (Add comment)     Pt states a Ceclor pill gave her blisters in her mouth.     HOSPITAL PROCEDURE(S):   No orders of the defined types were placed in this encounter.      REASON FOR HOSPITALIZATION AND HOSPITAL COURSE   BRIEF HPI:  This is a 82 y.o., female admitted for Gastroenteritis  BRIEF HOSPITAL NARRATIVE:   Patient is a 82 year old female admitted for acute abdominal pain.  In ED she was hypotensive and bradycardic improved with IV fluids and atropine.  CT abdomen showed concern for enteritis.  She was started  on IV antibiotics for condition improved over hospital course.  Her vitals have been stable she is stable to be discharged to home on p.o. antibiotics.      TRANSITION/POST DISCHARGE CARE/PENDING TESTS/REFERRALS:     CONDITION ON DISCHARGE:  A. Ambulation: Full ambulation  B. Self-care Ability: Complete  C. Cognitive Status Alert  D. Code status at discharge:       LINES/DRAINS/WOUNDS AT DISCHARGE:   Patient Lines/Drains/Airways Status       Active Line / Dialysis Catheter / Dialysis Graft / Drain / Airway / Wound       Name Placement date Placement time Site Days    Peripheral IV Left Cephalic  (lateral side of arm) 10/07/22  --  -- 2    Implantable Port Right Chest 09/26/22  1010  -- 13    Wound  Incision Right Chest 09/26/22  1001  -- 13                    DISCHARGE DISPOSITION:  Home discharge  DISCHARGE INSTRUCTIONS:  Post-Discharge Follow Up Appointments       Friday Oct 11, 2022    Lab with Lab Chair 2, Lab Prn P[facility] [place] at  8:30 AM    Lab with Lab Chair 1, Lab Prn [facility] [place] at  8:45 AM    Chemotherapy with BED 7 - 415, PRN ONC INFUSION at  9:00 AM      Friday Oct 18, 2022    Lab with Lab Chair 1, Lab Pr[facility] [place] at  9:45 AM    Return Patient Visit with Lupita Dawn, MD at 10:00 AM    Return Patient Visit with Nurse, [facility] Hem/Onc Prn Pc  at 10:00 AM    Chemotherapy with BED 6 - 414, PRN ONC INFUSION at 10:30 AM      Friday Oct 25, 2022  Chemotherapy with BED 10 - 416, PRN ONC INFUSION at  8:30 AM      Friday Nov 01, 2022    Chemotherapy with BED 10 - 416, PRN ONC INFUSION at  9:00 AM      Hematology/Oncology, [facility], [place]  [address]  [place] [address]  [phone] Laboratory Services, [facility], [place]  [address]  [place] [address]  [phone] [facility]  [facility], [place]   [address]  [place] [address]  [phone]          No  discharge procedures on file.       Genella Mech, MD    Copies sent to Care Team         Relationship Specialty Notifications Start End    Charm Rings, MD PCP - General GENERAL PRACTICE  07/11/21     Phone: [phone] Fax: [phone]         [address] [place] [address]    Marvene Staff, RN Oncology Nurse Navigator  Abnormal results only, Admissions 09/11/22             Referring providers can utilize [email or web address] to access their referred [facility] patient's information.

## 2022-10-09 NOTE — Nurses Notes (Signed)
Patient discharged home with family.  AVS reviewed with patient.  A written copy of the AVS and discharge instructions was given to the patient.  Questions sufficiently answered as needed.  Patient encouraged to follow up with PCP as indicated.  In the event of an emergency, patient instructed to call 911 or go to the nearest emergency room.

## 2022-10-09 NOTE — Care Plan (Signed)
Problem: Adult Inpatient Plan of Care  Goal: Plan of Care Review  Outcome: Ongoing (see interventions/notes)  Goal: Patient-Specific Goal (Individualized)  Outcome: Ongoing (see interventions/notes)  Flowsheets (Taken 10/09/2022 0830)  Individualized Care Needs: Monitor labs, vs  Anxieties, Fears or Concerns: None voiced  Patient-Specific Goals (Include Timeframe): Discharge  Plan of Care Reviewed With: patient  Goal: Absence of Hospital-Acquired Illness or Injury  Outcome: Ongoing (see interventions/notes)  Intervention: Identify and Manage Fall Risk  Recent Flowsheet Documentation  Taken 10/09/2022 0830 by Dallas Breeding, RN  Safety Promotion/Fall Prevention: fall prevention program maintained  Intervention: Prevent Skin Injury  Recent Flowsheet Documentation  Taken 10/09/2022 1200 by Dallas Breeding, RN  Body Position: supine, head elevated  Taken 10/09/2022 1100 by Dallas Breeding, RN  Skin Protection: adhesive use limited  Taken 10/09/2022 1000 by Dallas Breeding, RN  Body Position: sitting  Taken 10/09/2022 0830 by Dallas Breeding, RN  Body Position: sitting  Skin Protection: adhesive use limited  Intervention: Prevent and Manage VTE (Venous Thromboembolism) Risk  Recent Flowsheet Documentation  Taken 10/09/2022 0830 by Dallas Breeding, RN  VTE Prevention/Management:   foot pump device on   anticoagulant therapy maintained  Taken 10/09/2022 0800 by Dallas Breeding, RN  VTE Prevention/Management:   foot pump device off   anticoagulant therapy maintained  Intervention: Prevent Infection  Recent Flowsheet Documentation  Taken 10/09/2022 0830 by Dallas Breeding, RN  Infection Prevention:   rest/sleep promoted   promote handwashing  Goal: Optimal Comfort and Wellbeing  Outcome: Ongoing (see interventions/notes)  Intervention: Provide Person-Centered Care  Recent Flowsheet Documentation  Taken 10/09/2022 0830 by Dallas Breeding, RN  Trust Relationship/Rapport:   thoughts/feelings acknowledged    choices provided   emotional support provided   reassurance provided  Goal: Rounds/Family Conference  Outcome: Ongoing (see interventions/notes)

## 2022-10-11 ENCOUNTER — Ambulatory Visit (INDEPENDENT_AMBULATORY_CARE_PROVIDER_SITE_OTHER): Payer: Self-pay

## 2022-10-11 ENCOUNTER — Ambulatory Visit
Admission: RE | Admit: 2022-10-11 | Discharge: 2022-10-11 | Disposition: A | Discharge status: Home / Self Care | Payer: Medicare Other | Source: Ambulatory Visit | Attending: HEMATOLOGY-ONCOLOGY | Admitting: HEMATOLOGY-ONCOLOGY

## 2022-10-11 ENCOUNTER — Telehealth (HOSPITAL_COMMUNITY): Payer: Self-pay

## 2022-10-11 ENCOUNTER — Encounter (HOSPITAL_COMMUNITY): Payer: Self-pay

## 2022-10-11 ENCOUNTER — Other Ambulatory Visit: Payer: Self-pay

## 2022-10-11 VITALS — BP 142/67 | HR 65 | Temp 97.1°F | Resp 16 | Wt 215.8 lb

## 2022-10-11 DIAGNOSIS — C50919 Malignant neoplasm of unspecified site of unspecified female breast: Secondary | ICD-10-CM | POA: Insufficient documentation

## 2022-10-11 DIAGNOSIS — I82412 Acute embolism and thrombosis of left femoral vein: Secondary | ICD-10-CM | POA: Insufficient documentation

## 2022-10-11 LAB — CBC WITH DIFF
HCT: 29.2 % — ABNORMAL LOW (ref 31.2–41.9)
HGB: 9.8 g/dL — ABNORMAL LOW (ref 10.9–14.3)
MCH: 29.6 pg (ref 24.7–32.8)
MCHC: 33.6 g/dL (ref 32.3–35.6)
MCV: 88.1 fL (ref 75.5–95.3)
MPV: 9.4 fL (ref 7.9–10.8)
PLATELETS: 126 10*3/uL — ABNORMAL LOW (ref 140–440)
RBC: 3.32 10*6/uL — ABNORMAL LOW (ref 3.63–4.92)
RDW: 13.5 % (ref 12.3–17.7)
WBC: 1.6 10*3/uL — CL (ref 3.8–11.8)

## 2022-10-11 LAB — MANUAL DIFFERENTIAL
EOSINOPHIL %: 2 % (ref 0–7)
EOSINOPHIL ABSOLUTE: 0.03 10*3/uL (ref 0.00–0.80)
EOSINOPHILS MANUAL: 2
LYMPHOCYTE %: 54 % — ABNORMAL HIGH (ref 25–45)
LYMPHOCYTE ABSOLUTE: 0.86 10*3/uL — ABNORMAL LOW (ref 1.10–5.00)
LYMPHOCYTES MANUAL: 54
MONOCYTE %: 3 % (ref 0–12)
MONOCYTE ABSOLUTE: 0.05 10*3/uL (ref 0.00–1.30)
MONOCYTES MANUAL: 3
NEUTROPHIL %: 41 % (ref 40–76)
NEUTROPHIL ABSOLUTE: 0.66 10*3/uL — CL (ref 1.80–8.40)
NEUTROPHILS MANUAL: 41
PLATELET MORPHOLOGY COMMENT: ADEQUATE
TOTAL CELLS COUNTED [#] IN BLOOD: 100
WBC: 1.6 10*3/uL

## 2022-10-11 LAB — COMPREHENSIVE METABOLIC PANEL, NON-FASTING
ALBUMIN/GLOBULIN RATIO: 1.2 (ref 0.8–1.4)
ALBUMIN: 3.4 g/dL — ABNORMAL LOW (ref 3.5–5.7)
ALKALINE PHOSPHATASE: 107 U/L — ABNORMAL HIGH (ref 34–104)
ALT (SGPT): 41 U/L (ref 7–52)
ANION GAP: 7 mmol/L (ref 4–13)
AST (SGOT): 33 U/L (ref 13–39)
BILIRUBIN TOTAL: 0.4 mg/dL (ref 0.3–1.0)
BUN/CREA RATIO: 12 (ref 6–22)
BUN: 14 mg/dL (ref 7–25)
CALCIUM, CORRECTED: 10.5 mg/dL (ref 8.9–10.8)
CALCIUM: 10 mg/dL (ref 8.6–10.3)
CHLORIDE: 105 mmol/L (ref 98–107)
CO2 TOTAL: 26 mmol/L (ref 21–31)
CREATININE: 1.16 mg/dL (ref 0.60–1.30)
ESTIMATED GFR: 48 mL/min/{1.73_m2} — ABNORMAL LOW (ref >59)
GLOBULIN: 2.8 — ABNORMAL LOW (ref 2.9–5.4)
GLUCOSE: 213 mg/dL — ABNORMAL HIGH (ref 74–109)
OSMOLALITY, CALCULATED: 283 mOsm/kg (ref 270–290)
POTASSIUM: 3.3 mmol/L — ABNORMAL LOW (ref 3.5–5.1)
PROTEIN TOTAL: 6.2 g/dL — ABNORMAL LOW (ref 6.4–8.9)
SODIUM: 138 mmol/L (ref 136–145)

## 2022-10-11 LAB — MAGNESIUM: MAGNESIUM: 0.9 mg/dL — CL (ref 1.9–2.7)

## 2022-10-11 MED ORDER — MAGNESIUM SULFATE 500 MG/ML (50 %) INJECTION SOLUTION
INTRAMUSCULAR | Status: DC
Start: 2022-10-11 — End: 2022-10-11
  Filled 2022-10-11: qty 500

## 2022-10-11 MED ORDER — MAGNESIUM SULFATE 500 MG/ML (50 %) INJECTION SOLUTION
INTRAMUSCULAR | Status: AC
Start: 2022-10-11 — End: 2022-10-11
  Filled 2022-10-11: qty 500

## 2022-10-11 NOTE — Nurses Notes (Addendum)
1020 - Patient to room via wheelchair with family for treatment today. Assessment complete. Lungs clear throughout lung fields. Abdomen soft and non-tender, bowel sounds present. No edema noted. Patient denies any pain or discomfort at this time. Patient states she had some vomiting while admitted inpatient but that has resolved. She also says she had some diarrhea that has resolved. Carolynn Serve, RN  Patient Assessment/Symptom Management Patient Has No MD Appointment Today   Key: (+) Symptom present           (-)  Symptom not present If Symptom is Positive(+) a Nursing Note is required   Edema -   Uncontrolled Nausea -   Vomiting +   Inability to eat/drink -   Mouth Sores -   Diarrhea +   Constipation (? Last BM) -   Fatigue that interferes with ADL's -   Numbness/Tingling -change -   Other -   Fever/Signs & Symptoms of infection -   Nurse Initials JD   1045 - PAC accessed and labs drawn from port. Carolynn Serve, RN  [phone] - Secure chat sent to Dr. [name] by Woodroe Mode RN regarding wbc of 1.6 and anc of 0.6. Carolynn Serve, RN  [phone] - Secure chat sent to Dr. [name] by Woodroe Mode RN regarding Magnesium of 0.9. 6 grams of Magnesium ordered. Carolynn Serve, RN  [phone] - Per secure chat from Dr. [name], treatment is to be pushed bak for one week. Carolynn Serve, RN  [phone] - Magnesium infusion started. Carolynn Serve, RN  [phone] - Magnesium infusion complete. Line flushing. Carolynn Serve, RN  1555 - PAC flushed with NS and deaccessed. Gauze dressing and adhesive bandage applied. Carolynn Serve, RN  [phone] - Patient left via wheelchair with family at this time. Carolynn Serve, RN

## 2022-10-12 LAB — ADULT ROUTINE BLOOD CULTURE, SET OF 2 BOTTLES (BACTERIA AND YEAST)
BLOOD CULTURE, ROUTINE: NO GROWTH
BLOOD CULTURE, ROUTINE: NO GROWTH

## 2022-10-18 ENCOUNTER — Ambulatory Visit (INDEPENDENT_AMBULATORY_CARE_PROVIDER_SITE_OTHER): Payer: Medicare Other | Admitting: HEMATOLOGY-ONCOLOGY

## 2022-10-18 ENCOUNTER — Inpatient Hospital Stay (INDEPENDENT_AMBULATORY_CARE_PROVIDER_SITE_OTHER)
Admission: RE | Admit: 2022-10-18 | Discharge: 2022-10-18 | Disposition: A | Discharge status: Home / Self Care | Payer: Medicare Other | Source: Ambulatory Visit | Attending: HEMATOLOGY-ONCOLOGY | Admitting: HEMATOLOGY-ONCOLOGY

## 2022-10-18 ENCOUNTER — Other Ambulatory Visit: Payer: Self-pay

## 2022-10-18 ENCOUNTER — Encounter (INDEPENDENT_AMBULATORY_CARE_PROVIDER_SITE_OTHER): Payer: Self-pay | Admitting: HEMATOLOGY-ONCOLOGY

## 2022-10-18 ENCOUNTER — Ambulatory Visit
Admission: RE | Admit: 2022-10-18 | Discharge: 2022-10-18 | Disposition: A | Discharge status: Home / Self Care | Payer: Medicare Other | Source: Ambulatory Visit | Attending: HEMATOLOGY-ONCOLOGY | Admitting: HEMATOLOGY-ONCOLOGY

## 2022-10-18 VITALS — BP 149/77 | HR 83 | Temp 97.3°F | Resp 18

## 2022-10-18 VITALS — BP 150/78 | HR 74 | Temp 98.2°F | Ht 64.0 in | Wt 217.2 lb

## 2022-10-18 DIAGNOSIS — Z85110 Personal history of malignant carcinoid tumor of bronchus and lung: Secondary | ICD-10-CM

## 2022-10-18 DIAGNOSIS — Z5112 Encounter for antineoplastic immunotherapy: Secondary | ICD-10-CM | POA: Insufficient documentation

## 2022-10-18 DIAGNOSIS — Z85118 Personal history of other malignant neoplasm of bronchus and lung: Secondary | ICD-10-CM | POA: Insufficient documentation

## 2022-10-18 DIAGNOSIS — Z8540 Personal history of malignant neoplasm of unspecified female genital organ: Secondary | ICD-10-CM

## 2022-10-18 DIAGNOSIS — C50919 Malignant neoplasm of unspecified site of unspecified female breast: Secondary | ICD-10-CM

## 2022-10-18 DIAGNOSIS — R5383 Other fatigue: Secondary | ICD-10-CM

## 2022-10-18 DIAGNOSIS — Z5111 Encounter for antineoplastic chemotherapy: Secondary | ICD-10-CM | POA: Insufficient documentation

## 2022-10-18 DIAGNOSIS — Z8544 Personal history of malignant neoplasm of other female genital organs: Secondary | ICD-10-CM | POA: Insufficient documentation

## 2022-10-18 DIAGNOSIS — I82412 Acute embolism and thrombosis of left femoral vein: Secondary | ICD-10-CM

## 2022-10-18 DIAGNOSIS — Z86718 Personal history of other venous thrombosis and embolism: Secondary | ICD-10-CM | POA: Insufficient documentation

## 2022-10-18 DIAGNOSIS — Z7901 Long term (current) use of anticoagulants: Secondary | ICD-10-CM | POA: Insufficient documentation

## 2022-10-18 LAB — CBC WITH DIFF
BASOPHIL #: 0 10*3/uL (ref 0.00–0.10)
BASOPHIL %: 1 % (ref 0–1)
EOSINOPHIL #: 0 10*3/uL (ref 0.00–0.50)
EOSINOPHIL %: 1 % (ref 1–7)
HCT: 32.8 % (ref 31.2–41.9)
HGB: 10.8 g/dL — ABNORMAL LOW (ref 10.9–14.3)
LYMPHOCYTE #: 1.1 10*3/uL (ref 1.00–3.00)
LYMPHOCYTE %: 28 % (ref 16–44)
MCH: 29.3 pg (ref 24.7–32.8)
MCHC: 33.1 g/dL (ref 32.3–35.6)
MCV: 88.5 fL (ref 75.5–95.3)
MONOCYTE #: 0.4 10*3/uL (ref 0.30–1.00)
MONOCYTE %: 10 % (ref 5–13)
MPV: 8.1 fL (ref 7.9–10.8)
NEUTROPHIL #: 2.3 10*3/uL (ref 1.85–7.80)
NEUTROPHIL %: 60 % (ref 43–77)
PLATELETS: 220 10*3/uL (ref 140–440)
RBC: 3.7 10*6/uL (ref 3.63–4.92)
RDW: 13.7 % (ref 12.3–17.7)
WBC: 3.9 10*3/uL (ref 3.8–11.8)

## 2022-10-18 LAB — COMPREHENSIVE METABOLIC PANEL, NON-FASTING
ALBUMIN/GLOBULIN RATIO: 1.3 (ref 0.8–1.4)
ALBUMIN: 3.6 g/dL (ref 3.5–5.7)
ALKALINE PHOSPHATASE: 114 U/L — ABNORMAL HIGH (ref 34–104)
ALT (SGPT): 36 U/L (ref 7–52)
ANION GAP: 4 mmol/L (ref 4–13)
AST (SGOT): 27 U/L (ref 13–39)
BILIRUBIN TOTAL: 0.5 mg/dL (ref 0.3–1.0)
BUN/CREA RATIO: 10 (ref 6–22)
BUN: 10 mg/dL (ref 7–25)
CALCIUM, CORRECTED: 10.5 mg/dL (ref 8.9–10.8)
CALCIUM: 10.2 mg/dL (ref 8.6–10.3)
CHLORIDE: 109 mmol/L — ABNORMAL HIGH (ref 98–107)
CO2 TOTAL: 29 mmol/L (ref 21–31)
CREATININE: 1.03 mg/dL (ref 0.60–1.30)
ESTIMATED GFR: 55 mL/min/{1.73_m2} — ABNORMAL LOW (ref >59)
GLOBULIN: 2.8 — ABNORMAL LOW (ref 2.9–5.4)
GLUCOSE: 98 mg/dL (ref 74–109)
OSMOLALITY, CALCULATED: 282 mOsm/kg (ref 270–290)
POTASSIUM: 4.7 mmol/L (ref 3.5–5.1)
PROTEIN TOTAL: 6.4 g/dL (ref 6.4–8.9)
SODIUM: 142 mmol/L (ref 136–145)

## 2022-10-18 LAB — MAGNESIUM: MAGNESIUM: 1.3 mg/dL — ABNORMAL LOW (ref 1.9–2.7)

## 2022-10-18 MED ORDER — FAMOTIDINE (PF) 20 MG/2 ML INTRAVENOUS SOLUTION
20.0000 mg | Freq: Once | INTRAVENOUS | Status: DC | PRN
Start: 2022-10-18 — End: 2022-10-19

## 2022-10-18 MED ORDER — DIPHENHYDRAMINE 50 MG/ML INJECTION SOLUTION
25.0000 mg | Freq: Once | INTRAMUSCULAR | Status: AC
Start: 2022-10-18 — End: 2022-10-18
  Administered 2022-10-18: 25 mg via INTRAVENOUS

## 2022-10-18 MED ORDER — DEXTROSE 5% IN WATER (D5W) FLUSH BAG - 250 ML
INTRAVENOUS | Status: DC | PRN
Start: 2022-10-18 — End: 2022-10-19

## 2022-10-18 MED ORDER — MAGNESIUM SULFATE 1 GRAM/100 ML IN DEXTROSE 5 % INTRAVENOUS PIGGYBACK
INJECTION | INTRAVENOUS | Status: AC
Start: 2022-10-18 — End: 2022-10-18
  Filled 2022-10-18: qty 400

## 2022-10-18 MED ORDER — SODIUM CHLORIDE 0.9 % INTRAVENOUS SOLUTION
150.0000 mg | Freq: Once | INTRAVENOUS | Status: AC
Start: 2022-10-18 — End: 2022-10-18
  Administered 2022-10-18: 0 mg via INTRAVENOUS
  Administered 2022-10-18: 150 mg via INTRAVENOUS
  Filled 2022-10-18: qty 25

## 2022-10-18 MED ORDER — TRASTUZUMAB-ANNS 150 MG INTRAVENOUS SOLUTION
2.0000 mg/kg | Freq: Once | INTRAVENOUS | Status: AC
Start: 2022-10-18 — End: 2022-10-18
  Administered 2022-10-18: 0 mg via INTRAVENOUS
  Administered 2022-10-18: 189 mg via INTRAVENOUS
  Filled 2022-10-18: qty 9

## 2022-10-18 MED ORDER — ALBUTEROL SULFATE 2.5 MG/3 ML (0.083 %) SOLUTION FOR NEBULIZATION
2.5000 mg | INHALATION_SOLUTION | Freq: Once | RESPIRATORY_TRACT | Status: DC | PRN
Start: 2022-10-18 — End: 2022-10-19

## 2022-10-18 MED ORDER — DEXAMETHASONE SODIUM PHOSPHATE (PF) 10 MG/ML INJECTION SOLUTION
10.0000 mg | Freq: Once | INTRAMUSCULAR | Status: AC
Start: 2022-10-18 — End: 2022-10-18
  Administered 2022-10-18: 10 mg via INTRAVENOUS

## 2022-10-18 MED ORDER — SODIUM CHLORIDE 0.9% FLUSH BAG - 250 ML
INTRAVENOUS | Status: DC | PRN
Start: 2022-10-18 — End: 2022-10-19

## 2022-10-18 MED ORDER — FAMOTIDINE (PF) 20 MG/2 ML INTRAVENOUS SOLUTION
20.0000 mg | Freq: Once | INTRAVENOUS | Status: AC
Start: 2022-10-18 — End: 2022-10-18
  Administered 2022-10-18: 20 mg via INTRAVENOUS

## 2022-10-18 MED ORDER — HYDROCORTISONE SOD SUCCINATE 100 MG/2 ML VIAL WRAPPER
100.0000 mg | Freq: Once | INTRAMUSCULAR | Status: DC | PRN
Start: 2022-10-18 — End: 2022-10-19

## 2022-10-18 MED ORDER — EPINEPHRINE 1 MG/ML (1 ML) INJECTION SOLUTION
0.3000 mg | Freq: Once | INTRAMUSCULAR | Status: DC | PRN
Start: 2022-10-18 — End: 2022-10-19

## 2022-10-18 MED ORDER — FAMOTIDINE (PF) 20 MG/2 ML INTRAVENOUS SOLUTION
INTRAVENOUS | Status: AC
Start: 2022-10-18 — End: 2022-10-18
  Filled 2022-10-18: qty 2

## 2022-10-18 MED ORDER — DIPHENHYDRAMINE 50 MG/ML INJECTION SOLUTION
25.0000 mg | Freq: Once | INTRAMUSCULAR | Status: DC | PRN
Start: 2022-10-18 — End: 2022-10-19

## 2022-10-18 MED ORDER — DEXAMETHASONE SODIUM PHOSPHATE (PF) 10 MG/ML INJECTION SOLUTION
INTRAMUSCULAR | Status: AC
Start: 2022-10-18 — End: 2022-10-18
  Filled 2022-10-18: qty 1

## 2022-10-18 MED ORDER — DIPHENHYDRAMINE 50 MG/ML INJECTION SOLUTION
INTRAMUSCULAR | Status: AC
Start: 2022-10-18 — End: 2022-10-18
  Filled 2022-10-18: qty 1

## 2022-10-18 MED ORDER — ALBUTEROL SULFATE HFA 90 MCG/ACTUATION AEROSOL INHALER - RN
2.0000 | Freq: Once | RESPIRATORY_TRACT | Status: DC | PRN
Start: 2022-10-18 — End: 2022-10-19

## 2022-10-18 MED ORDER — ONDANSETRON HCL 8 MG TABLET
16.0000 mg | ORAL_TABLET | Freq: Once | ORAL | Status: AC
Start: 2022-10-18 — End: 2022-10-18
  Administered 2022-10-18: 16 mg via ORAL

## 2022-10-18 MED ORDER — ONDANSETRON HCL 8 MG TABLET
ORAL_TABLET | ORAL | Status: AC
Start: 2022-10-18 — End: 2022-10-18
  Filled 2022-10-18: qty 2

## 2022-10-18 MED ORDER — MEPERIDINE (PF) 25 MG/ML INJECTION SOLUTION
12.5000 mg | Freq: Once | INTRAMUSCULAR | Status: DC | PRN
Start: 2022-10-18 — End: 2022-10-19

## 2022-10-18 MED ORDER — DIPHENHYDRAMINE 50 MG/ML INJECTION SOLUTION
50.0000 mg | Freq: Once | INTRAMUSCULAR | Status: DC | PRN
Start: 2022-10-18 — End: 2022-10-19

## 2022-10-18 MED ORDER — MAGNESIUM SULFATE 1 GRAM/100 ML IN DEXTROSE 5 % INTRAVENOUS PIGGYBACK
1.0000 g | INJECTION | INTRAVENOUS | Status: AC
Start: 2022-10-18 — End: 2022-10-18
  Administered 2022-10-18: 1 g via INTRAVENOUS
  Administered 2022-10-18 (×2): 0 g via INTRAVENOUS
  Administered 2022-10-18 (×2): 1 g via INTRAVENOUS
  Administered 2022-10-18: 0 g via INTRAVENOUS
  Administered 2022-10-18: 1 g via INTRAVENOUS
  Administered 2022-10-18: 0 g via INTRAVENOUS

## 2022-10-18 NOTE — Nursing Note (Signed)
Triaged patient vitals were WNL.  She is being seen by [place] for Breast and lung cancer. She missed the last treatment due to being neutropenic and she ended up being admitted. We got her started again today with no changes.

## 2022-10-18 NOTE — Nurses Notes (Signed)
1137: Patient arrived via w/c with multiple family members at side. VSS. Assessments WNL. No complaints voiced. Has already seen provider and had labs drawn.Evans Lance, RN  1200: Labs resulted and med orders release. Patient will need 4 gms Magnesium today as well. Right port accessed per protocol.Evans Lance, RN  [phone]: Pre meds given per orders.Evans Lance, RN  [phone]: Ernestine Mcmurray given IV over 30 minutes.me  [phone]/1430-1612: 4 gms Magnesium IV per protocol.Evans Lance, RN  [phone]: Taxol infused over 1hrs minutes per pharmacy.Evans Lance, RN  [phone]: Infusion and flush complete. VSS. Port de-accessed per policy.Evans Lance, RN  [phone]: Patient left unit via w/c with multiple family - no new distress reported. Evans Lance, RN

## 2022-10-18 NOTE — Progress Notes (Signed)
Department of Hematology/Oncology  Progress Note   Name: Kelsey Preston  OVF:[id number]  Date of Birth: 09/01/1940  Encounter Date: 10/18/2022    REFERRING PROVIDER:  Charm Rings, MD  [address]  [place],  [place] [address]    TELEMEDICINE DOCUMENTATION:  Patient Location:  [facility], [facility] outpatient Hematology/Oncology [address], [place] [address]  Patient/family aware of provider location:  yes  Patient/family consent for telemedicine:  yes    REASON FOR OFFICE VISIT:  Breast Cancer and Lung Cancer     HISTORY OF PRESENT ILLNESS:  Kelsey Preston is a 82 y.o. female who presents today for follow up of breast cancer     The patient has a history of non-small-cell lung cancer, status post lobectomy in 2014.      She also has a history of endometrial cancer, and is status post surgical resection of that cancer as well.    More recently, the patient was found to have a left axillary lesion measuring 2.3 cm on PET-CT scan.  For some reason, I can not find an actual size measurement on the pathology report.  Multiple imaging studies of the breast were performed, but no primary lesion was found.  The malignant lesion was definitively classified as a lymph node.    Receptor staining showed that it was ER negative and HER2 Neu positive.  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    09/10/2022: The patient is here for follow up of early stage breast cancer.  She is doing well and denies any new problems at this time.    09/27/2022: The patient is here for follow up breast cancer.  She is doing well at this time and has no new complaints.    10/18/2022: the patient is here for follow up of breast cancer. She has been on treatment with paclitaxel and trastuzumab. She has been hospitalized since the last visit due to neutropenia and hypotension. She has also had electrolyte issues, with her magnesium level being somewhat low. She is taking over-the-counter magnesium oxide.    ROS:   Pertinent review  of systems as discussed in HPI    HISTORY:  Past Medical History:   Diagnosis Date    Diabetes mellitus, type 2 (CMS HCC)     Embolism (CMS HCC)     left leg    Endometrial cancer (CMS HCC)     HTN (hypertension)     Hx of breast cancer     Lung cancer (CMS HCC)     Macular degeneration (senile) of retina          Past Surgical History:   Procedure Laterality Date    HX CHOLECYSTECTOMY      HX COLONOSCOPY      HX HYSTERECTOMY      HX LOBECTOMY      LUNG CANCER SURGERY      PORTACATH PLACEMENT           Social History     Socioeconomic History    Marital status: Married     Spouse name: Not on file    Number of children: Not on file    Years of education: Not on file    Highest education level: Not on file   Occupational History    Not on file   Tobacco Use    Smoking status: Never    Smokeless tobacco: Never   Vaping Use    Vaping status: Never Used   Substance and  Sexual Activity    Alcohol use: Never    Drug use: Never    Sexual activity: Not Currently   Other Topics Concern    Not on file   Social History Narrative    Not on file     Social Determinants of Health     Financial Resource Strain: Not on file   Transportation Needs: Not on file   Social Connections: Low Risk  (10/07/2022)    Social Connections     SDOH Social Isolation: 5 or more times a week   Intimate Partner Violence: Not on file   Housing Stability: Not on file     Family Medical History:       Problem Relation (Age of Onset)    Cancer Brother            Current Outpatient Medications   Medication Sig    atorvastatin (LIPITOR) 40 mg Oral Tablet Take 1 Tablet (40 mg total) by mouth Every evening for 30 days    esomeprazole magnesium (NEXIUM) 40 mg Oral Capsule, Delayed Release(E.C.) Take 1 Capsule (40 mg total) by mouth Every morning before breakfast    ezetimibe (ZETIA) 10 mg Oral Tablet Take 1 Tablet (10 mg total) by mouth Every evening    famotidine (PEPCID) 40 mg Oral Tablet Take 1 Tablet (40 mg total) by mouth Once a day    magnesium oxide  (MAG-OX) 400 mg Oral Tablet Take 1 Tablet (400 mg total) by mouth Twice daily    metFORMIN (GLUCOPHAGE XR) 500 mg Oral Tablet Sustained Release 24 hr Take 1 Tablet (500 mg total) by mouth Twice daily    multivitamin with iron Oral Tablet Take 1 Tablet by mouth Once a day    ondansetron (ZOFRAN ODT) 4 mg Oral Tablet, Rapid Dissolve Take 1 Tablet (4 mg total) by mouth Every 8 hours as needed for Nausea/Vomiting Indications: prevent nausea and vomiting from cancer chemotherapy    rivaroxaban (XARELTO) 20 mg Oral Tablet Take 1 Tablet (20 mg total) by mouth Every evening with dinner for 120 days    sertraline (ZOLOFT) 50 mg Oral Tablet Take 1 Tablet (50 mg total) by mouth Once a day    traMADoL (ULTRAM) 50 mg Oral Tablet Take 1 Tablet (50 mg total) by mouth Every 4 hours as needed for Pain     Allergies   Allergen Reactions    Adhesive Rash    Ceclor [Cefaclor]  Other Adverse Reaction (Add comment)     Pt states a Ceclor pill gave her blisters in her mouth.       PHYSICAL EXAM:  Most Recent Vitals    Flowsheet Row Telemedicine from 09/02/2022 in Hematology/Oncology, [facility]   Temperature 36.3 C (97.3 F) filed at... 09/02/2022 0911   Heart Rate 77 filed at... 09/02/2022 0911   Respiratory Rate --   BP (Non-Invasive) 174/91 filed at... 09/02/2022 0911   SpO2 95 % filed at... 09/02/2022 0911   Height 1.626 m (5\' 4" ) filed at... 09/02/2022 0911   Weight 98.2 kg (216 lb 6.4 oz) filed at... 09/02/2022 0911   BMI (Calculated) 37.22 filed at... 09/02/2022 0911   BSA (Calculated) 2.11 filed at... 09/02/2022 0911      ECOG Status: (1) Restricted in physically strenuous activity, ambulatory and able to do work of light nature   Physical Exam    DIAGNOSTIC DATA:  No results found for this or any previous visit (from the past [address] hour(s)).    LABS:  CBC  Diff   Lab Results   Component Value Date/Time    WBC 1.6 (LL) 10/11/2022 10:47 AM    WBC 1.6 10/11/2022 10:47 AM    HGB 9.8 (L) 10/11/2022 10:47 AM    HCT 29.2 (L)  10/11/2022 10:47 AM    PLTCNT 126 (L) 10/11/2022 10:47 AM    RBC 3.32 (L) 10/11/2022 10:47 AM    MCV 88.1 10/11/2022 10:47 AM    MCHC 33.6 10/11/2022 10:47 AM    MCH 29.6 10/11/2022 10:47 AM    RDW 13.5 10/11/2022 10:47 AM    MPV 9.4 10/11/2022 10:47 AM    Lab Results   Component Value Date/Time    PMNS 41 10/11/2022 10:47 AM    LYMPHOCYTES 54 (H) 10/11/2022 10:47 AM    EOSINOPHIL 2 10/11/2022 10:47 AM    MONOCYTES 3 10/11/2022 10:47 AM    BASOPHILS 1 10/08/2022 02:26 AM    BASOPHILS 0.00 10/08/2022 02:26 AM    PMNABS 2.70 10/08/2022 02:26 AM    LYMPHSABS 0.40 (L) 10/08/2022 02:26 AM    EOSABS 0.00 10/08/2022 02:26 AM    MONOSABS 0.10 (L) 10/08/2022 02:26 AM    BASABS 0.04 05/30/2022 05:40 AM            Comprehensive Metabolic Profile    Lab Results   Component Value Date    SODIUM 138 10/11/2022    POTASSIUM 3.3 (L) 10/11/2022    CHLORIDE 105 10/11/2022    CO2 26 10/11/2022    ANIONGAP 7 10/11/2022    BUN 14 10/11/2022    CREATININE 1.16 10/11/2022    ALBUMIN 3.4 (L) 10/11/2022    CALCIUM 10.0 10/11/2022    GLUCOSENF 213 (H) 10/11/2022    ALKPHOS 107 (H) 10/11/2022    ALT 41 10/11/2022    AST 33 10/11/2022    TOTBILIRUBIN 0.4 10/11/2022    TOTALPROTEIN 6.2 (L) 10/11/2022          BASIC METABOLIC PANEL  Lab Results   Component Value Date    SODIUM 138 10/11/2022    POTASSIUM 3.3 (L) 10/11/2022    CHLORIDE 105 10/11/2022    CO2 26 10/11/2022    ANIONGAP 7 10/11/2022    BUN 14 10/11/2022    CREATININE 1.16 10/11/2022    BUNCRRATIO 12 10/11/2022    GFR 48 (L) 10/11/2022    CALCIUM 10.0 10/11/2022    GLUCOSE Negative 10/07/2022    GLUCOSENF 213 (H) 10/11/2022           ASSESSMENT:  Problem List Items Addressed This Visit          Oncology    Breast cancer (CMS HCC) - Primary    Relevant Orders    COMPREHENSIVE METABOLIC PANEL, NON-FASTING    CBC/DIFF    MAGNESIUM       Cardiovascular System    Acute deep vein thrombosis (DVT) of femoral vein of left lower extremity (CMS HCC)    Relevant Orders    COMPREHENSIVE METABOLIC  PANEL, NON-FASTING    CBC/DIFF    MAGNESIUM        ICD-10-CM    1. Breast cancer (CMS HCC)  C50.919 COMPREHENSIVE METABOLIC PANEL, NON-FASTING     CBC/DIFF     MAGNESIUM      2. Acute deep vein thrombosis (DVT) of femoral vein of left lower extremity (CMS HCC)  I82.412 COMPREHENSIVE METABOLIC PANEL, NON-FASTING     CBC/DIFF     MAGNESIUM  PLAN:   1. All relevant medical records were reviewed including available pertinent provider notes, procedure notes, imaging, laboratory, and pathology.   2. All pertinent labs and/or imaging were reviewed with the patient.   3. Breast cancer:  Status post resection of an involved lymph node that was 2.3 cm on PET/CT.  No primary lesion was seen on imaging studies.  T0 N1 M0, stage IIA.  ER negative, HER2 Neu positive.  After resection of the node, she was rendered cancer-free radiographically.  She is on treatment with adjuvant paclitaxel and trastuzumab.  With the unknown primary location, we discussed the options of either no additional surgery and taking our chances versus radiation of the breast versus mastectomy.  I believe she is still undecided but maybe leaning towards mastectomy.  That is a decision that can be made after chemotherapy has been completed.  Radiation to the axillary area will likely be required due to the lymph node that was known to be involved. I will slightly decrease the dose of Paclitaxel due to the patient having developed neutropenia recently from it.  4. History of lung cancer:  Continue surveillance.    5. History of gynecologic cancer: Continue surveillance as well.  6. Fatigue:  Her T3 and T4 on the lower end of normal range.  She could potentially have a little bit of thyroid medication.  She will think about it.    Kelsey Preston was given the chance to ask questions, and these were answered to their satisfaction. The patient is welcome to call with any questions or concerns in the meantime.     On the day of the encounter, a total of 35  minutes was spent on this patient encounter including review of historical information, examination, documentation and post-visit activities.   Return in about 3 weeks (around 11/08/2022).     Lupita Dawn, MD  10/18/2022 , 11:08  The patient was seen as part of a collaborative telemedicine service with Dr. [name] who participated in the encounter by active presence via approved video/audio means for portions of the encounter.  The patient's insurance company bears full legal and financial responsibility resulting from any deviations that they cause to my recommended treatment plan.   CC:  Charm Rings, MD  [address]  [place] [address]    Charm Rings, MD  [address]  [place],  [place] [address]    This note was partially generated using MModal Fluency Direct system, and there may be some incorrect words, spellings, and punctuation that were not noted in checking the note before saving.

## 2022-10-22 ENCOUNTER — Other Ambulatory Visit (INDEPENDENT_AMBULATORY_CARE_PROVIDER_SITE_OTHER): Payer: Self-pay | Admitting: NURSE PRACTITIONER

## 2022-10-22 DIAGNOSIS — I82409 Acute embolism and thrombosis of unspecified deep veins of unspecified lower extremity: Secondary | ICD-10-CM

## 2022-10-25 ENCOUNTER — Other Ambulatory Visit: Payer: Self-pay

## 2022-10-25 ENCOUNTER — Ambulatory Visit
Admission: RE | Admit: 2022-10-25 | Discharge: 2022-10-25 | Disposition: A | Discharge status: Home / Self Care | Payer: Medicare Other | Source: Ambulatory Visit | Attending: HEMATOLOGY-ONCOLOGY | Admitting: HEMATOLOGY-ONCOLOGY

## 2022-10-25 ENCOUNTER — Encounter (HOSPITAL_COMMUNITY): Payer: Self-pay

## 2022-10-25 ENCOUNTER — Other Ambulatory Visit (INDEPENDENT_AMBULATORY_CARE_PROVIDER_SITE_OTHER): Payer: Self-pay | Admitting: HEMATOLOGY-ONCOLOGY

## 2022-10-25 VITALS — BP 158/99 | HR 100 | Temp 97.0°F | Resp 18

## 2022-10-25 DIAGNOSIS — I82412 Acute embolism and thrombosis of left femoral vein: Secondary | ICD-10-CM | POA: Insufficient documentation

## 2022-10-25 DIAGNOSIS — Z5111 Encounter for antineoplastic chemotherapy: Secondary | ICD-10-CM | POA: Insufficient documentation

## 2022-10-25 DIAGNOSIS — C50919 Malignant neoplasm of unspecified site of unspecified female breast: Secondary | ICD-10-CM | POA: Insufficient documentation

## 2022-10-25 DIAGNOSIS — Z5112 Encounter for antineoplastic immunotherapy: Secondary | ICD-10-CM | POA: Insufficient documentation

## 2022-10-25 LAB — CBC WITH DIFF
BASOPHIL #: 0 10*3/uL (ref 0.00–0.10)
BASOPHIL %: 1 % (ref 0–1)
EOSINOPHIL #: 0 10*3/uL (ref 0.00–0.50)
EOSINOPHIL %: 1 % (ref 1–7)
HCT: 32 % (ref 31.2–41.9)
HGB: 10.4 g/dL — ABNORMAL LOW (ref 10.9–14.3)
LYMPHOCYTE #: 0.8 10*3/uL — ABNORMAL LOW (ref 1.00–3.00)
LYMPHOCYTE %: 16 % (ref 16–44)
MCH: 28.9 pg (ref 24.7–32.8)
MCHC: 32.7 g/dL (ref 32.3–35.6)
MCV: 88.6 fL (ref 75.5–95.3)
MONOCYTE #: 0.2 10*3/uL — ABNORMAL LOW (ref 0.30–1.00)
MONOCYTE %: 3 % — ABNORMAL LOW (ref 5–13)
MPV: 9.6 fL (ref 7.9–10.8)
NEUTROPHIL #: 4 10*3/uL (ref 1.85–7.80)
NEUTROPHIL %: 80 % — ABNORMAL HIGH (ref 43–77)
PLATELETS: 181 10*3/uL (ref 140–440)
RBC: 3.61 10*6/uL — ABNORMAL LOW (ref 3.63–4.92)
RDW: 13.5 % (ref 12.3–17.7)
WBC: 5 10*3/uL (ref 3.8–11.8)

## 2022-10-25 LAB — COMPREHENSIVE METABOLIC PANEL, NON-FASTING
ALBUMIN/GLOBULIN RATIO: 1.3 (ref 0.8–1.4)
ALBUMIN: 3.8 g/dL (ref 3.5–5.7)
ALKALINE PHOSPHATASE: 110 U/L — ABNORMAL HIGH (ref 34–104)
ALT (SGPT): 22 U/L (ref 7–52)
ANION GAP: 6 mmol/L (ref 4–13)
AST (SGOT): 22 U/L (ref 13–39)
BILIRUBIN TOTAL: 0.7 mg/dL (ref 0.3–1.0)
BUN/CREA RATIO: 17 (ref 6–22)
BUN: 18 mg/dL (ref 7–25)
CALCIUM, CORRECTED: 10.6 mg/dL (ref 8.9–10.8)
CALCIUM: 10.4 mg/dL — ABNORMAL HIGH (ref 8.6–10.3)
CHLORIDE: 106 mmol/L (ref 98–107)
CO2 TOTAL: 27 mmol/L (ref 21–31)
CREATININE: 1.08 mg/dL (ref 0.60–1.30)
ESTIMATED GFR: 52 mL/min/{1.73_m2} — ABNORMAL LOW (ref >59)
GLOBULIN: 2.9 (ref 2.9–5.4)
GLUCOSE: 151 mg/dL — ABNORMAL HIGH (ref 74–109)
OSMOLALITY, CALCULATED: 282 mOsm/kg (ref 270–290)
POTASSIUM: 4.1 mmol/L (ref 3.5–5.1)
PROTEIN TOTAL: 6.7 g/dL (ref 6.4–8.9)
SODIUM: 139 mmol/L (ref 136–145)

## 2022-10-25 LAB — MAGNESIUM: MAGNESIUM: 1.1 mg/dL — ABNORMAL LOW (ref 1.9–2.7)

## 2022-10-25 MED ORDER — DEXTROSE 5% IN WATER (D5W) FLUSH BAG - 250 ML
INTRAVENOUS | Status: DC | PRN
Start: 2022-10-25 — End: 2022-10-26

## 2022-10-25 MED ORDER — EPINEPHRINE 1 MG/ML (1 ML) INJECTION SOLUTION
0.3000 mg | Freq: Once | INTRAMUSCULAR | Status: DC | PRN
Start: 2022-10-25 — End: 2022-10-26

## 2022-10-25 MED ORDER — MEPERIDINE (PF) 25 MG/ML INJECTION SOLUTION
12.5000 mg | Freq: Once | INTRAMUSCULAR | Status: DC | PRN
Start: 2022-10-25 — End: 2022-10-26

## 2022-10-25 MED ORDER — PACLITAXEL 6 MG/ML CONCENTRATE,INTRAVENOUS
150.0000 mg | Freq: Once | INTRAVENOUS | Status: AC
Start: 2022-10-25 — End: 2022-10-25
  Administered 2022-10-25: 0 mg via INTRAVENOUS
  Administered 2022-10-25: 150 mg via INTRAVENOUS
  Filled 2022-10-25: qty 25

## 2022-10-25 MED ORDER — FAMOTIDINE (PF) 20 MG/2 ML INTRAVENOUS SOLUTION
20.0000 mg | Freq: Once | INTRAVENOUS | Status: DC | PRN
Start: 2022-10-25 — End: 2022-10-26

## 2022-10-25 MED ORDER — DIPHENHYDRAMINE 50 MG/ML INJECTION SOLUTION
25.0000 mg | Freq: Once | INTRAMUSCULAR | Status: AC
Start: 2022-10-25 — End: 2022-10-25
  Administered 2022-10-25: 25 mg via INTRAVENOUS

## 2022-10-25 MED ORDER — DIPHENHYDRAMINE 50 MG/ML INJECTION SOLUTION
25.0000 mg | Freq: Once | INTRAMUSCULAR | Status: DC | PRN
Start: 2022-10-25 — End: 2022-10-26

## 2022-10-25 MED ORDER — FAMOTIDINE (PF) 20 MG/2 ML INTRAVENOUS SOLUTION
INTRAVENOUS | Status: AC
Start: 2022-10-25 — End: 2022-10-25
  Filled 2022-10-25: qty 2

## 2022-10-25 MED ORDER — DIPHENHYDRAMINE 50 MG/ML INJECTION SOLUTION
50.0000 mg | Freq: Once | INTRAMUSCULAR | Status: DC | PRN
Start: 2022-10-25 — End: 2022-10-26

## 2022-10-25 MED ORDER — TRASTUZUMAB-ANNS 150 MG INTRAVENOUS SOLUTION
2.0000 mg/kg | Freq: Once | INTRAVENOUS | Status: AC
Start: 2022-10-25 — End: 2022-10-25
  Administered 2022-10-25: 0 mg via INTRAVENOUS
  Administered 2022-10-25: 189 mg via INTRAVENOUS
  Filled 2022-10-25: qty 9

## 2022-10-25 MED ORDER — MAGNESIUM SULFATE 1 GRAM/100 ML IN DEXTROSE 5 % INTRAVENOUS PIGGYBACK
1.0000 g | INJECTION | INTRAVENOUS | Status: AC
Start: 2022-10-25 — End: 2022-10-25
  Administered 2022-10-25 (×3): 1 g via INTRAVENOUS
  Administered 2022-10-25: 0 g via INTRAVENOUS
  Administered 2022-10-25: 1 g via INTRAVENOUS
  Administered 2022-10-25 (×3): 0 g via INTRAVENOUS

## 2022-10-25 MED ORDER — ONDANSETRON HCL 8 MG TABLET
ORAL_TABLET | ORAL | Status: AC
Start: 2022-10-25 — End: 2022-10-25
  Filled 2022-10-25: qty 2

## 2022-10-25 MED ORDER — ALBUTEROL SULFATE HFA 90 MCG/ACTUATION AEROSOL INHALER - RN
2.0000 | Freq: Once | RESPIRATORY_TRACT | Status: DC | PRN
Start: 2022-10-25 — End: 2022-10-26

## 2022-10-25 MED ORDER — ALBUTEROL SULFATE 2.5 MG/3 ML (0.083 %) SOLUTION FOR NEBULIZATION
2.5000 mg | INHALATION_SOLUTION | Freq: Once | RESPIRATORY_TRACT | Status: DC | PRN
Start: 2022-10-25 — End: 2022-10-26

## 2022-10-25 MED ORDER — FAMOTIDINE (PF) 20 MG/2 ML INTRAVENOUS SOLUTION
20.0000 mg | Freq: Once | INTRAVENOUS | Status: AC
Start: 2022-10-25 — End: 2022-10-25
  Administered 2022-10-25: 20 mg via INTRAVENOUS

## 2022-10-25 MED ORDER — DEXAMETHASONE SODIUM PHOSPHATE 4 MG/ML INJECTION SOLUTION
INTRAMUSCULAR | Status: AC
Start: 2022-10-25 — End: 2022-10-25
  Filled 2022-10-25: qty 1

## 2022-10-25 MED ORDER — SODIUM CHLORIDE 0.9% FLUSH BAG - 250 ML
INTRAVENOUS | Status: DC | PRN
Start: 2022-10-25 — End: 2022-10-26

## 2022-10-25 MED ORDER — MAGNESIUM SULFATE 1 GRAM/100 ML IN DEXTROSE 5 % INTRAVENOUS PIGGYBACK
INJECTION | INTRAVENOUS | Status: AC
Start: 2022-10-25 — End: 2022-10-25
  Filled 2022-10-25: qty 400

## 2022-10-25 MED ORDER — DEXAMETHASONE SODIUM PHOSPHATE 4 MG/ML INJECTION SOLUTION
4.0000 mg | Freq: Once | INTRAMUSCULAR | Status: AC
Start: 2022-10-25 — End: 2022-10-25
  Administered 2022-10-25: 4 mg via INTRAVENOUS

## 2022-10-25 MED ORDER — DIPHENHYDRAMINE 50 MG/ML INJECTION SOLUTION
INTRAMUSCULAR | Status: AC
Start: 2022-10-25 — End: 2022-10-25
  Filled 2022-10-25: qty 1

## 2022-10-25 MED ORDER — HYDROCORTISONE SOD SUCCINATE 100 MG/2 ML VIAL WRAPPER
100.0000 mg | Freq: Once | INTRAMUSCULAR | Status: DC | PRN
Start: 2022-10-25 — End: 2022-10-26

## 2022-10-25 MED ORDER — ONDANSETRON HCL 8 MG TABLET
16.0000 mg | ORAL_TABLET | Freq: Once | ORAL | Status: AC
Start: 2022-10-25 — End: 2022-10-25
  Administered 2022-10-25: 16 mg via ORAL

## 2022-10-25 NOTE — Nurses Notes (Addendum)
0830-Patient to room by wheelchair.  Patient is fall risk.  Precautions made.  Patient is oriented x4, lungs are clear, and has strong palpable pulses.  Patient report episode of nausea and vomiting last night after meal.  Otherwise patient has no complaints and does not report excessive N/V.  Vitals are stable.Salli Quarry, RN    Patient Assessment/Symptom Management Patient Has No MD Appointment Today   Key: (+) Symptom present           (-)  Symptom not present If Symptom is Positive(+) a Nursing Note is required   Edema +   Uncontrolled Nausea -   Vomiting -   Inability to eat/drink -   Mouth Sores -   Diarrhea -   Constipation (? Last BM) -   Fatigue that interferes with ADL's +   Numbness/Tingling -change -   Other +   Fever/Signs & Symptoms of infection -   Nurse Initials KB       0930-PAC accessed and labs collected.Salli Quarry, RN  1000-Magnesium infusion started.  4 grams total. Fredric Mare, RN  1001-Decadron, Pepcid, Zofran, and Benadryl given.Salli Quarry, RN  1035-KANJINTI infusion started.Salli Quarry, RN  1105-KANJINTI infusion complete.Salli Quarry, RN  1127-TAXOL infusion started.Salli Quarry, RN  1220-Magnesium infusion complete.Salli Quarry, RN  1227-TAXOL infusion complete.Salli Quarry, RN  1305-PAC flushed per protocol and deaccessed.  Blood return obtained prior to Janeece Riggers, RN  1315-Patient checking out at this time.  Vitals stable.Salli Quarry, RN

## 2022-10-31 ENCOUNTER — Encounter (INDEPENDENT_AMBULATORY_CARE_PROVIDER_SITE_OTHER): Payer: Self-pay | Admitting: HEMATOLOGY-ONCOLOGY

## 2022-11-01 ENCOUNTER — Other Ambulatory Visit: Payer: Self-pay

## 2022-11-01 ENCOUNTER — Ambulatory Visit
Admission: RE | Admit: 2022-11-01 | Discharge: 2022-11-01 | Disposition: A | Discharge status: Home / Self Care | Payer: Medicare Other | Source: Ambulatory Visit | Attending: HEMATOLOGY-ONCOLOGY | Admitting: HEMATOLOGY-ONCOLOGY

## 2022-11-01 VITALS — BP 145/93 | HR 92 | Temp 98.6°F | Resp 20 | Ht 64.0 in | Wt 212.0 lb

## 2022-11-01 DIAGNOSIS — Z5111 Encounter for antineoplastic chemotherapy: Secondary | ICD-10-CM | POA: Insufficient documentation

## 2022-11-01 DIAGNOSIS — I82412 Acute embolism and thrombosis of left femoral vein: Secondary | ICD-10-CM | POA: Insufficient documentation

## 2022-11-01 DIAGNOSIS — C50919 Malignant neoplasm of unspecified site of unspecified female breast: Secondary | ICD-10-CM | POA: Insufficient documentation

## 2022-11-01 DIAGNOSIS — Z5112 Encounter for antineoplastic immunotherapy: Secondary | ICD-10-CM | POA: Insufficient documentation

## 2022-11-01 LAB — CBC WITH DIFF
BASOPHIL #: 0 10*3/uL (ref 0.00–0.10)
BASOPHIL %: 1 % (ref 0–1)
EOSINOPHIL #: 0 10*3/uL (ref 0.00–0.50)
EOSINOPHIL %: 2 % (ref 1–7)
HCT: 30 % — ABNORMAL LOW (ref 31.2–41.9)
HGB: 9.9 g/dL — ABNORMAL LOW (ref 10.9–14.3)
LYMPHOCYTE #: 0.9 10*3/uL — ABNORMAL LOW (ref 1.00–3.00)
LYMPHOCYTE %: 39 % (ref 16–44)
MCH: 29.2 pg (ref 24.7–32.8)
MCHC: 33 g/dL (ref 32.3–35.6)
MCV: 88.4 fL (ref 75.5–95.3)
MONOCYTE #: 0.1 10*3/uL — ABNORMAL LOW (ref 0.30–1.00)
MONOCYTE %: 6 % (ref 5–13)
MPV: 9.3 fL (ref 7.9–10.8)
NEUTROPHIL #: 1.2 10*3/uL — ABNORMAL LOW (ref 1.85–7.80)
NEUTROPHIL %: 52 % (ref 43–77)
PLATELETS: 150 10*3/uL (ref 140–440)
RBC: 3.39 10*6/uL — ABNORMAL LOW (ref 3.63–4.92)
RDW: 13.8 % (ref 12.3–17.7)
WBC: 2.3 10*3/uL — ABNORMAL LOW (ref 3.8–11.8)

## 2022-11-01 LAB — MAGNESIUM: MAGNESIUM: 1.2 mg/dL — ABNORMAL LOW (ref 1.9–2.7)

## 2022-11-01 MED ORDER — DEXAMETHASONE SODIUM PHOSPHATE 4 MG/ML INJECTION SOLUTION
INTRAMUSCULAR | Status: AC
Start: 2022-11-01 — End: 2022-11-01
  Filled 2022-11-01: qty 1

## 2022-11-01 MED ORDER — EPINEPHRINE 1 MG/ML (1 ML) INJECTION SOLUTION
0.3000 mg | Freq: Once | INTRAMUSCULAR | Status: DC | PRN
Start: 2022-11-01 — End: 2022-11-02

## 2022-11-01 MED ORDER — DEXAMETHASONE SODIUM PHOSPHATE 4 MG/ML INJECTION SOLUTION
4.0000 mg | Freq: Once | INTRAMUSCULAR | Status: AC
Start: 2022-11-01 — End: 2022-11-01
  Administered 2022-11-01: 4 mg via INTRAVENOUS

## 2022-11-01 MED ORDER — MAGNESIUM SULFATE 1 GRAM/100 ML IN DEXTROSE 5 % INTRAVENOUS PIGGYBACK
1.0000 g | INJECTION | INTRAVENOUS | Status: AC
Start: 2022-11-01 — End: 2022-11-01
  Administered 2022-11-01: 1 g via INTRAVENOUS
  Administered 2022-11-01 (×3): 0 g via INTRAVENOUS
  Administered 2022-11-01 (×2): 1 g via INTRAVENOUS

## 2022-11-01 MED ORDER — MAGNESIUM SULFATE 1 GRAM/100 ML IN DEXTROSE 5 % INTRAVENOUS PIGGYBACK
INJECTION | INTRAVENOUS | Status: AC
Start: 2022-11-01 — End: 2022-11-01
  Filled 2022-11-01: qty 400

## 2022-11-01 MED ORDER — ALBUTEROL SULFATE HFA 90 MCG/ACTUATION AEROSOL INHALER - RN
2.0000 | Freq: Once | RESPIRATORY_TRACT | Status: DC | PRN
Start: 2022-11-01 — End: 2022-11-02

## 2022-11-01 MED ORDER — MEPERIDINE (PF) 25 MG/ML INJECTION SOLUTION
12.5000 mg | Freq: Once | INTRAMUSCULAR | Status: DC | PRN
Start: 2022-11-01 — End: 2022-11-02

## 2022-11-01 MED ORDER — DIPHENHYDRAMINE 50 MG/ML INJECTION SOLUTION
INTRAMUSCULAR | Status: AC
Start: 2022-11-01 — End: 2022-11-01
  Filled 2022-11-01: qty 1

## 2022-11-01 MED ORDER — DIPHENHYDRAMINE 50 MG/ML INJECTION SOLUTION
25.0000 mg | Freq: Once | INTRAMUSCULAR | Status: AC
Start: 2022-11-01 — End: 2022-11-01
  Administered 2022-11-01: 25 mg via INTRAVENOUS

## 2022-11-01 MED ORDER — FAMOTIDINE (PF) 20 MG/2 ML INTRAVENOUS SOLUTION
20.0000 mg | Freq: Once | INTRAVENOUS | Status: DC | PRN
Start: 2022-11-01 — End: 2022-11-02

## 2022-11-01 MED ORDER — DIPHENHYDRAMINE 50 MG/ML INJECTION SOLUTION
25.0000 mg | Freq: Once | INTRAMUSCULAR | Status: DC | PRN
Start: 2022-11-01 — End: 2022-11-02

## 2022-11-01 MED ORDER — HYDROCORTISONE SOD SUCCINATE 100 MG/2 ML VIAL WRAPPER
100.0000 mg | Freq: Once | INTRAMUSCULAR | Status: DC | PRN
Start: 2022-11-01 — End: 2022-11-02

## 2022-11-01 MED ORDER — MAGNESIUM SULFATE 1 GRAM/100 ML IN DEXTROSE 5 % INTRAVENOUS PIGGYBACK
1.0000 g | INJECTION | INTRAVENOUS | Status: AC
Start: 2022-11-01 — End: 2022-11-01
  Administered 2022-11-01: 0 g via INTRAVENOUS
  Administered 2022-11-01: 1 g via INTRAVENOUS

## 2022-11-01 MED ORDER — ONDANSETRON HCL 8 MG TABLET
ORAL_TABLET | ORAL | Status: AC
Start: 2022-11-01 — End: 2022-11-01
  Filled 2022-11-01: qty 2

## 2022-11-01 MED ORDER — SODIUM CHLORIDE 0.9% FLUSH BAG - 250 ML
INTRAVENOUS | Status: DC | PRN
Start: 2022-11-01 — End: 2022-11-02

## 2022-11-01 MED ORDER — ONDANSETRON HCL 8 MG TABLET
16.0000 mg | ORAL_TABLET | Freq: Once | ORAL | Status: AC
Start: 2022-11-01 — End: 2022-11-01
  Administered 2022-11-01: 16 mg via ORAL

## 2022-11-01 MED ORDER — SODIUM CHLORIDE 0.9 % INTRAVENOUS SOLUTION
150.0000 mg | Freq: Once | INTRAVENOUS | Status: AC
Start: 2022-11-01 — End: 2022-11-01
  Administered 2022-11-01: 150 mg via INTRAVENOUS
  Administered 2022-11-01: 0 mg via INTRAVENOUS
  Filled 2022-11-01: qty 25

## 2022-11-01 MED ORDER — DIPHENHYDRAMINE 50 MG/ML INJECTION SOLUTION
50.0000 mg | Freq: Once | INTRAMUSCULAR | Status: DC | PRN
Start: 2022-11-01 — End: 2022-11-02

## 2022-11-01 MED ORDER — TRASTUZUMAB-ANNS 150 MG INTRAVENOUS SOLUTION
2.0000 mg/kg | Freq: Once | INTRAVENOUS | Status: AC
Start: 2022-11-01 — End: 2022-11-01
  Administered 2022-11-01: 189 mg via INTRAVENOUS
  Administered 2022-11-01: 0 mg via INTRAVENOUS
  Filled 2022-11-01: qty 9

## 2022-11-01 MED ORDER — FAMOTIDINE (PF) 20 MG/2 ML INTRAVENOUS SOLUTION
INTRAVENOUS | Status: AC
Start: 2022-11-01 — End: 2022-11-01
  Filled 2022-11-01: qty 2

## 2022-11-01 MED ORDER — FAMOTIDINE (PF) 20 MG/2 ML INTRAVENOUS SOLUTION
20.0000 mg | Freq: Once | INTRAVENOUS | Status: AC
Start: 2022-11-01 — End: 2022-11-01
  Administered 2022-11-01: 20 mg via INTRAVENOUS

## 2022-11-01 MED ORDER — ALBUTEROL SULFATE 2.5 MG/3 ML (0.083 %) SOLUTION FOR NEBULIZATION
2.5000 mg | INHALATION_SOLUTION | Freq: Once | RESPIRATORY_TRACT | Status: DC | PRN
Start: 2022-11-01 — End: 2022-11-02

## 2022-11-01 MED ORDER — DEXTROSE 5% IN WATER (D5W) FLUSH BAG - 250 ML
INTRAVENOUS | Status: DC | PRN
Start: 2022-11-01 — End: 2022-11-02

## 2022-11-01 NOTE — Nurses Notes (Signed)
0160-1093 Patient came in via w/c with husband and daughter. Port accessed. Labs drawn via port. Patient denies any c/o at this time. Labs resulted. Premeds given. Kanjinti infusion given over 30 minutes. Tolerated well. Taxol infusion given over one hour. Tolerated well. Magnesium 1.2. Magnesium 4 grams given iv over 2 hours. Tolerated well. Port flushed and deaccessed. Site clear. Left unit via w/c with volunteer and family. No c/o offered. Lovenia Kim, RN

## 2022-11-06 ENCOUNTER — Encounter (INDEPENDENT_AMBULATORY_CARE_PROVIDER_SITE_OTHER): Payer: Self-pay | Admitting: HEMATOLOGY-ONCOLOGY

## 2022-11-08 ENCOUNTER — Ambulatory Visit
Admission: RE | Admit: 2022-11-08 | Discharge: 2022-11-08 | Disposition: A | Discharge status: Home / Self Care | Payer: Medicare Other | Source: Ambulatory Visit | Attending: HEMATOLOGY-ONCOLOGY | Admitting: HEMATOLOGY-ONCOLOGY

## 2022-11-08 ENCOUNTER — Encounter (HOSPITAL_COMMUNITY): Payer: Self-pay

## 2022-11-08 ENCOUNTER — Encounter (INDEPENDENT_AMBULATORY_CARE_PROVIDER_SITE_OTHER): Payer: Self-pay | Admitting: HEMATOLOGY-ONCOLOGY

## 2022-11-08 ENCOUNTER — Ambulatory Visit (HOSPITAL_BASED_OUTPATIENT_CLINIC_OR_DEPARTMENT_OTHER): Payer: Medicare Other | Admitting: HEMATOLOGY-ONCOLOGY

## 2022-11-08 ENCOUNTER — Other Ambulatory Visit: Payer: Self-pay

## 2022-11-08 ENCOUNTER — Inpatient Hospital Stay (INDEPENDENT_AMBULATORY_CARE_PROVIDER_SITE_OTHER)
Admission: RE | Admit: 2022-11-08 | Discharge: 2022-11-08 | Disposition: A | Discharge status: Home / Self Care | Payer: Medicare Other | Source: Ambulatory Visit | Attending: HEMATOLOGY-ONCOLOGY | Admitting: HEMATOLOGY-ONCOLOGY

## 2022-11-08 VITALS — BP 173/99 | HR 102 | Temp 98.2°F | Ht 64.0 in | Wt 208.4 lb

## 2022-11-08 VITALS — BP 158/103 | HR 91 | Temp 98.5°F | Resp 18

## 2022-11-08 DIAGNOSIS — Z7969 Long term (current) use of other immunomodulators and immunosuppressants: Secondary | ICD-10-CM | POA: Insufficient documentation

## 2022-11-08 DIAGNOSIS — Z902 Acquired absence of lung [part of]: Secondary | ICD-10-CM | POA: Insufficient documentation

## 2022-11-08 DIAGNOSIS — C50912 Malignant neoplasm of unspecified site of left female breast: Secondary | ICD-10-CM | POA: Insufficient documentation

## 2022-11-08 DIAGNOSIS — Z85118 Personal history of other malignant neoplasm of bronchus and lung: Secondary | ICD-10-CM | POA: Insufficient documentation

## 2022-11-08 DIAGNOSIS — C50919 Malignant neoplasm of unspecified site of unspecified female breast: Secondary | ICD-10-CM

## 2022-11-08 DIAGNOSIS — Z8542 Personal history of malignant neoplasm of other parts of uterus: Secondary | ICD-10-CM | POA: Insufficient documentation

## 2022-11-08 DIAGNOSIS — Z7901 Long term (current) use of anticoagulants: Secondary | ICD-10-CM | POA: Insufficient documentation

## 2022-11-08 DIAGNOSIS — Z9889 Other specified postprocedural states: Secondary | ICD-10-CM | POA: Insufficient documentation

## 2022-11-08 DIAGNOSIS — Z90710 Acquired absence of both cervix and uterus: Secondary | ICD-10-CM | POA: Insufficient documentation

## 2022-11-08 DIAGNOSIS — Z79633 Long term (current) use of mitotic inhibitor: Secondary | ICD-10-CM | POA: Insufficient documentation

## 2022-11-08 DIAGNOSIS — Z5111 Encounter for antineoplastic chemotherapy: Secondary | ICD-10-CM | POA: Insufficient documentation

## 2022-11-08 DIAGNOSIS — I82412 Acute embolism and thrombosis of left femoral vein: Secondary | ICD-10-CM

## 2022-11-08 DIAGNOSIS — Z171 Estrogen receptor negative status [ER-]: Secondary | ICD-10-CM | POA: Insufficient documentation

## 2022-11-08 DIAGNOSIS — Z5112 Encounter for antineoplastic immunotherapy: Secondary | ICD-10-CM | POA: Insufficient documentation

## 2022-11-08 DIAGNOSIS — Z86718 Personal history of other venous thrombosis and embolism: Secondary | ICD-10-CM | POA: Insufficient documentation

## 2022-11-08 LAB — CBC WITH DIFF
BASOPHIL #: 0 10*3/uL (ref 0.00–0.10)
BASOPHIL %: 1 % (ref 0–1)
EOSINOPHIL #: 0.1 10*3/uL (ref 0.00–0.50)
EOSINOPHIL %: 2 % (ref 1–7)
HCT: 33.7 % (ref 31.2–41.9)
HGB: 11.2 g/dL (ref 10.9–14.3)
LYMPHOCYTE #: 1.1 10*3/uL (ref 1.00–3.00)
LYMPHOCYTE %: 42 % (ref 16–44)
MCH: 29.2 pg (ref 24.7–32.8)
MCHC: 33.3 g/dL (ref 32.3–35.6)
MCV: 87.5 fL (ref 75.5–95.3)
MONOCYTE #: 0.2 10*3/uL — ABNORMAL LOW (ref 0.30–1.00)
MONOCYTE %: 6 % (ref 5–13)
MPV: 8.5 fL (ref 7.9–10.8)
NEUTROPHIL #: 1.3 10*3/uL — ABNORMAL LOW (ref 1.85–7.80)
NEUTROPHIL %: 49 % (ref 43–77)
PLATELETS: 204 10*3/uL (ref 140–440)
RBC: 3.85 10*6/uL (ref 3.63–4.92)
RDW: 14.3 % (ref 12.3–17.7)
WBC: 2.6 10*3/uL — ABNORMAL LOW (ref 3.8–11.8)

## 2022-11-08 LAB — MAGNESIUM: MAGNESIUM: 1.1 mg/dL — ABNORMAL LOW (ref 1.9–2.7)

## 2022-11-08 MED ORDER — ALBUTEROL SULFATE HFA 90 MCG/ACTUATION AEROSOL INHALER - RN
2.0000 | Freq: Once | RESPIRATORY_TRACT | Status: DC | PRN
Start: 2022-11-08 — End: 2022-11-09

## 2022-11-08 MED ORDER — EPINEPHRINE 1 MG/ML (1 ML) INJECTION SOLUTION
0.3000 mg | Freq: Once | INTRAMUSCULAR | Status: DC | PRN
Start: 2022-11-08 — End: 2022-11-09

## 2022-11-08 MED ORDER — DIPHENHYDRAMINE 50 MG/ML INJECTION SOLUTION
INTRAMUSCULAR | Status: AC
Start: 2022-11-08 — End: 2022-11-08
  Filled 2022-11-08: qty 1

## 2022-11-08 MED ORDER — ONDANSETRON HCL 8 MG TABLET
16.0000 mg | ORAL_TABLET | Freq: Once | ORAL | Status: AC
Start: 2022-11-08 — End: 2022-11-08
  Administered 2022-11-08: 16 mg via ORAL

## 2022-11-08 MED ORDER — DIPHENHYDRAMINE 50 MG/ML INJECTION SOLUTION
25.0000 mg | Freq: Once | INTRAMUSCULAR | Status: AC
Start: 2022-11-08 — End: 2022-11-08
  Administered 2022-11-08: 25 mg via INTRAVENOUS

## 2022-11-08 MED ORDER — DEXTROSE 5% IN WATER (D5W) FLUSH BAG - 250 ML
INTRAVENOUS | Status: DC | PRN
Start: 2022-11-08 — End: 2022-11-09

## 2022-11-08 MED ORDER — ALBUTEROL SULFATE 2.5 MG/3 ML (0.083 %) SOLUTION FOR NEBULIZATION
2.5000 mg | INHALATION_SOLUTION | Freq: Once | RESPIRATORY_TRACT | Status: DC | PRN
Start: 2022-11-08 — End: 2022-11-09

## 2022-11-08 MED ORDER — MEPERIDINE (PF) 25 MG/ML INJECTION SOLUTION
12.5000 mg | Freq: Once | INTRAMUSCULAR | Status: DC | PRN
Start: 2022-11-08 — End: 2022-11-09

## 2022-11-08 MED ORDER — DEXAMETHASONE SODIUM PHOSPHATE 4 MG/ML INJECTION SOLUTION
4.0000 mg | Freq: Once | INTRAMUSCULAR | Status: AC
Start: 2022-11-08 — End: 2022-11-08
  Administered 2022-11-08: 4 mg via INTRAVENOUS

## 2022-11-08 MED ORDER — ONDANSETRON HCL 8 MG TABLET
ORAL_TABLET | ORAL | Status: AC
Start: 2022-11-08 — End: 2022-11-08
  Filled 2022-11-08: qty 2

## 2022-11-08 MED ORDER — DIPHENHYDRAMINE 50 MG/ML INJECTION SOLUTION
50.0000 mg | Freq: Once | INTRAMUSCULAR | Status: DC | PRN
Start: 2022-11-08 — End: 2022-11-09

## 2022-11-08 MED ORDER — DIPHENHYDRAMINE 50 MG/ML INJECTION SOLUTION
25.0000 mg | Freq: Once | INTRAMUSCULAR | Status: AC | PRN
Start: 2022-11-08 — End: 2022-11-09

## 2022-11-08 MED ORDER — MAGNESIUM SULFATE 1 GRAM/100 ML IN DEXTROSE 5 % INTRAVENOUS PIGGYBACK
INJECTION | INTRAVENOUS | Status: AC
Start: 2022-11-08 — End: 2022-11-08
  Filled 2022-11-08: qty 400

## 2022-11-08 MED ORDER — SODIUM CHLORIDE 0.9% FLUSH BAG - 250 ML
INTRAVENOUS | Status: DC | PRN
Start: 2022-11-08 — End: 2022-11-09

## 2022-11-08 MED ORDER — FAMOTIDINE (PF) 20 MG/2 ML INTRAVENOUS SOLUTION
INTRAVENOUS | Status: AC
Start: 2022-11-08 — End: 2022-11-08
  Filled 2022-11-08: qty 2

## 2022-11-08 MED ORDER — HYDROCORTISONE SOD SUCCINATE 100 MG/2 ML VIAL WRAPPER
100.0000 mg | Freq: Once | INTRAMUSCULAR | Status: DC | PRN
Start: 2022-11-08 — End: 2022-11-09

## 2022-11-08 MED ORDER — MAGNESIUM SULFATE 1 GRAM/100 ML IN DEXTROSE 5 % INTRAVENOUS PIGGYBACK
1.0000 g | INJECTION | Freq: Four times a day (QID) | INTRAVENOUS | Status: AC | PRN
Start: 2022-11-08 — End: 2022-11-08
  Administered 2022-11-08: 0 g via INTRAVENOUS
  Administered 2022-11-08: 1 g via INTRAVENOUS
  Administered 2022-11-08: 0 g via INTRAVENOUS
  Administered 2022-11-08 (×2): 1 g via INTRAVENOUS
  Administered 2022-11-08: 0 g via INTRAVENOUS
  Administered 2022-11-08: 1 g via INTRAVENOUS
  Administered 2022-11-08: 0 g via INTRAVENOUS

## 2022-11-08 MED ORDER — TRASTUZUMAB-ANNS 150 MG INTRAVENOUS SOLUTION
2.0000 mg/kg | Freq: Once | INTRAVENOUS | Status: AC
Start: 2022-11-08 — End: 2022-11-08
  Administered 2022-11-08: 0 mg via INTRAVENOUS
  Administered 2022-11-08: 189 mg via INTRAVENOUS
  Filled 2022-11-08: qty 9

## 2022-11-08 MED ORDER — FAMOTIDINE (PF) 20 MG/2 ML INTRAVENOUS SOLUTION
20.0000 mg | Freq: Once | INTRAVENOUS | Status: DC | PRN
Start: 2022-11-08 — End: 2022-11-09

## 2022-11-08 MED ORDER — SODIUM CHLORIDE 0.9 % INTRAVENOUS SOLUTION
150.0000 mg | Freq: Once | INTRAVENOUS | Status: AC
Start: 2022-11-08 — End: 2022-11-08
  Administered 2022-11-08: 150 mg via INTRAVENOUS
  Administered 2022-11-08: 0 mg via INTRAVENOUS
  Filled 2022-11-08: qty 25

## 2022-11-08 MED ORDER — FAMOTIDINE (PF) 20 MG/2 ML INTRAVENOUS SOLUTION
20.0000 mg | Freq: Once | INTRAVENOUS | Status: AC
Start: 2022-11-08 — End: 2022-11-08
  Administered 2022-11-08: 20 mg via INTRAVENOUS

## 2022-11-08 MED ORDER — DEXAMETHASONE SODIUM PHOSPHATE 4 MG/ML INJECTION SOLUTION
INTRAMUSCULAR | Status: AC
Start: 2022-11-08 — End: 2022-11-08
  Filled 2022-11-08: qty 1

## 2022-11-08 NOTE — Progress Notes (Unsigned)
Department of Hematology/Oncology  Progress Note   Name: Kelsey Preston  ZOX:[id number]  Date of Birth: 08-04-1940  Encounter Date: 11/08/2022    REFERRING PROVIDER:  Charm Rings, MD  [address]  [place],  [place] [address]    REASON FOR OFFICE VISIT:  Follow Up (Breast cancer (CMS HCC)/09/10/2022/History of lung cancer/)     HISTORY OF PRESENT ILLNESS:  Kelsey Preston is a 83 y.o. female who presents today for follow up of breast cancer     The patient has a history of non-small-cell lung cancer, status post lobectomy in 2014.      She also has a history of endometrial cancer, and is status post surgical resection of that cancer as well.    More recently, the patient was found to have a left axillary lesion measuring 2.3 cm on PET-CT scan.  For some reason, I can not find an actual size measurement on the pathology report.  Multiple imaging studies of the breast were performed, but no primary lesion was found.  The malignant lesion was definitively classified as a lymph node.    Receptor staining showed that it was ER negative and HER2 Neu positive.  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    09/10/2022: The patient is here for follow up of early stage breast cancer.  She is doing well and denies any new problems at this time.    09/27/2022: The patient is here for follow up breast cancer.  She is doing well at this time and has no new complaints.    10/18/2022: the patient is here for follow up of breast cancer. She has been on treatment with paclitaxel and trastuzumab. She has been hospitalized since the last visit due to neutropenia and hypotension. She has also had electrolyte issues, with her magnesium level being somewhat low. She is taking over-the-counter magnesium oxide.    11/08/2022: The patient is here for follow up of breast cancer.  She has been on treatment with weekly paclitaxel and trastuzumab.  She did end up in the hospital relatively recently with some low cell counts, but it appears to have  resolved.    ROS:   Pertinent review of systems as discussed in HPI    HISTORY:  Past Medical History:   Diagnosis Date    Diabetes mellitus, type 2 (CMS HCC)     Embolism (CMS HCC)     left leg    Endometrial cancer (CMS HCC)     HTN (hypertension)     Hx of breast cancer     Lung cancer (CMS HCC)     Macular degeneration (senile) of retina          Past Surgical History:   Procedure Laterality Date    HX CHOLECYSTECTOMY      HX COLONOSCOPY      HX HYSTERECTOMY      HX LOBECTOMY      LUNG CANCER SURGERY      PORTACATH PLACEMENT           Social History     Socioeconomic History    Marital status: Married     Spouse name: Not on file    Number of children: Not on file    Years of education: Not on file    Highest education level: Not on file   Occupational History    Not on file   Tobacco Use    Smoking status: Never    Smokeless tobacco: Never  Vaping Use    Vaping status: Never Used   Substance and Sexual Activity    Alcohol use: Never    Drug use: Never    Sexual activity: Not Currently   Other Topics Concern    Not on file   Social History Narrative    Not on file     Social Determinants of Health     Financial Resource Strain: Not on file   Transportation Needs: Not on file   Social Connections: Low Risk  (10/07/2022)    Social Connections     SDOH Social Isolation: 5 or more times a week   Intimate Partner Violence: Not on file   Housing Stability: Not on file     Family Medical History:       Problem Relation (Age of Onset)    Cancer Brother            Current Outpatient Medications   Medication Sig    atorvastatin (LIPITOR) 40 mg Oral Tablet Take 1 Tablet (40 mg total) by mouth Every evening for 30 days    esomeprazole magnesium (NEXIUM) 40 mg Oral Capsule, Delayed Release(E.C.) Take 1 Capsule (40 mg total) by mouth Every morning before breakfast    ezetimibe (ZETIA) 10 mg Oral Tablet Take 1 Tablet (10 mg total) by mouth Every evening    famotidine (PEPCID) 40 mg Oral Tablet Take 1 Tablet (40 mg total) by  mouth Once a day    magnesium oxide (MAG-OX) 400 mg Oral Tablet Take 1 Tablet (400 mg total) by mouth Twice daily    metFORMIN (GLUCOPHAGE XR) 500 mg Oral Tablet Sustained Release 24 hr Take 1 Tablet (500 mg total) by mouth Twice daily    multivitamin with iron Oral Tablet Take 1 Tablet by mouth Once a day    ondansetron (ZOFRAN ODT) 4 mg Oral Tablet, Rapid Dissolve Take 1 Tablet (4 mg total) by mouth Every 8 hours as needed for Nausea/Vomiting Indications: prevent nausea and vomiting from cancer chemotherapy    sertraline (ZOLOFT) 50 mg Oral Tablet Take 1 Tablet (50 mg total) by mouth Once a day    traMADoL (ULTRAM) 50 mg Oral Tablet Take 1 Tablet (50 mg total) by mouth Every 4 hours as needed for Pain    XARELTO 20 mg Oral Tablet TAKE 1 TABLET BY MOUTH EVERY EVENING WITH DINNER     Allergies   Allergen Reactions    Adhesive Rash    Ceclor [Cefaclor]  Other Adverse Reaction (Add comment)     Pt states a Ceclor pill gave her blisters in her mouth.       PHYSICAL EXAM:  Most Recent Vitals    Flowsheet Row Telemedicine from 09/02/2022 in Hematology/Oncology, [facility]   Temperature 36.3 C (97.3 F) filed at... 09/02/2022 0911   Heart Rate 77 filed at... 09/02/2022 0911   Respiratory Rate --   BP (Non-Invasive) 174/91 filed at... 09/02/2022 0911   SpO2 95 % filed at... 09/02/2022 0911   Height 1.626 m (5\' 4" ) filed at... 09/02/2022 0911   Weight 98.2 kg (216 lb 6.4 oz) filed at... 09/02/2022 0911   BMI (Calculated) 37.22 filed at... 09/02/2022 0911   BSA (Calculated) 2.11 filed at... 09/02/2022 0911      ECOG Status: (1) Restricted in physically strenuous activity, ambulatory and able to do work of light nature   Physical Exam    DIAGNOSTIC DATA:  No results found for this or any previous visit (from the past  17520 hour(s)).    LABS:   CBC  Diff   Lab Results   Component Value Date/Time    WBC 2.6 (L) 11/08/2022 08:11 AM    HGB 11.2 11/08/2022 08:11 AM    HCT 33.7 11/08/2022 08:11 AM    PLTCNT 204 11/08/2022  08:11 AM    RBC 3.85 11/08/2022 08:11 AM    MCV 87.5 11/08/2022 08:11 AM    MCHC 33.3 11/08/2022 08:11 AM    MCH 29.2 11/08/2022 08:11 AM    RDW 14.3 11/08/2022 08:11 AM    MPV 8.5 11/08/2022 08:11 AM    Lab Results   Component Value Date/Time    PMNS 49 11/08/2022 08:11 AM    LYMPHOCYTES 42 11/08/2022 08:11 AM    EOSINOPHIL 2 11/08/2022 08:11 AM    MONOCYTES 6 11/08/2022 08:11 AM    BASOPHILS 1 11/08/2022 08:11 AM    BASOPHILS 0.00 11/08/2022 08:11 AM    PMNABS 1.30 (L) 11/08/2022 08:11 AM    LYMPHSABS 1.10 11/08/2022 08:11 AM    EOSABS 0.10 11/08/2022 08:11 AM    MONOSABS 0.20 (L) 11/08/2022 08:11 AM    BASABS 0.04 05/30/2022 05:40 AM            Comprehensive Metabolic Profile    Lab Results   Component Value Date    SODIUM 139 10/25/2022    POTASSIUM 4.1 10/25/2022    CHLORIDE 106 10/25/2022    CO2 27 10/25/2022    ANIONGAP 6 10/25/2022    BUN 18 10/25/2022    CREATININE 1.08 10/25/2022    ALBUMIN 3.8 10/25/2022    CALCIUM 10.4 (H) 10/25/2022    GLUCOSENF 151 (H) 10/25/2022    ALKPHOS 110 (H) 10/25/2022    ALT 22 10/25/2022    AST 22 10/25/2022    TOTBILIRUBIN 0.7 10/25/2022    TOTALPROTEIN 6.7 10/25/2022          BASIC METABOLIC PANEL  Lab Results   Component Value Date    SODIUM 139 10/25/2022    POTASSIUM 4.1 10/25/2022    CHLORIDE 106 10/25/2022    CO2 27 10/25/2022    ANIONGAP 6 10/25/2022    BUN 18 10/25/2022    CREATININE 1.08 10/25/2022    BUNCRRATIO 17 10/25/2022    GFR 52 (L) 10/25/2022    CALCIUM 10.4 (H) 10/25/2022    GLUCOSE Negative 10/07/2022    GLUCOSENF 151 (H) 10/25/2022           ASSESSMENT:  Problem List Items Addressed This Visit          Oncology    Breast cancer (CMS HCC) - Primary        ICD-10-CM    1. Breast cancer (CMS HCC)  C50.919            PLAN:   1. All relevant medical records were reviewed including available pertinent provider notes, procedure notes, imaging, laboratory, and pathology.   2. All pertinent labs and/or imaging were reviewed with the patient.   3. Breast cancer:   Status post resection of an involved lymph node that was 2.3 cm on PET/CT.  No primary lesion was seen on imaging studies.  T0 N1 M0, stage IIA.  ER negative, HER2 Neu positive.  After resection of the node, she was rendered cancer-free radiographically.  She is on treatment with adjuvant paclitaxel and trastuzumab.  With the unknown primary location, we discussed the options of either no additional surgery and taking our chances versus radiation of the  breast versus mastectomy.  I believe she is still undecided but maybe leaning towards mastectomy.  That is a decision that can be made after chemotherapy has been completed.  Radiation to the axillary area will likely be required due to the lymph node that was known to be involved.  4. History of lung cancer:  Continue surveillance.    5. History of gynecologic cancer: Continue surveillance as well.    Ewa Kot was given the chance to ask questions, and these were answered to their satisfaction. The patient is welcome to call with any questions or concerns in the meantime.     On the day of the encounter, a total of 35 minutes was spent on this patient encounter including review of historical information, examination, documentation and post-visit activities.   Return in about 4 weeks (around 12/06/2022).     Lupita Dawn, MD  11/08/2022 , 09:35  The patient's insurance company bears full legal and financial responsibility resulting from any deviations that they cause to my recommended treatment plan.   CC:  Charm Rings, MD  [address]  [place] [address]    Charm Rings, MD  [address]  [place],  [place] [address]    This note was partially generated using MModal Fluency Direct system, and there may be some incorrect words, spellings, and punctuation that were not noted in checking the note before saving.

## 2022-11-08 NOTE — Nursing Note (Signed)
Assisted pt to wig room, husband and other family with her. Pt picked one wig to her liking, velvet band, one head covering, wig stand and instructions for care given.

## 2022-11-08 NOTE — Nurses Notes (Addendum)
0921-Patient to room by wheelchair from doctor's office.  Patient reports feeling well today.  Just dealing with fatigue after treatments.  Patient is oriented x4, lung sounds are clear, and strong palpable pulses present.  Vitals are stable.Salli Quarry, RN      Patient Assessment/Symptom Management Patient Has MD Appointment Today   Key: (+) Symptom present           (-)  Symptom not present If Symptom is Positive(+) a Nursing Note is required   Edema -   Uncontrolled Nausea -   Vomiting -   Inability to eat/drink -   Mouth Sores -   Diarrhea -   Constipation (? Last BM) -   Fatigue that interferes with ADL's +   Numbness/Tingling -change -   Other -   Fever/Signs & Symptoms of infection -   Nurse Initials KB       0930-PAC accessed and flushed per protocol.  Blood return obtained.Salli Quarry, RN  0946-Decadron, Benadryl, Pepcid, and Zofran given.Salli Quarry, RN  0949-Magnesium infusion started (4 grams total to infuse).Salli Quarry, RN  1040-KANJINTI infusion started.Salli Quarry, RN  1110-KANJINTI infusion complete.Salli Quarry, RN  1132-TAXOL infusion started.Salli Quarry, RN  1224-Magnesium infusion complete.Salli Quarry, RN  1232-TAXOL infusion complete.Salli Quarry, RN  1255-PAC flushed and deaccessed.  Blood return obtained prior to Janeece Riggers, RN  1307-Patient checking out at this time.  Vitals are stable.Salli Quarry, RN

## 2022-11-08 NOTE — Nursing Note (Signed)
Patient completed psychosocial distress screening using the Enhanced NCCN Distress Thermometer (DT) Tool and self-reported an overall score of 5. Several family members present during time of visit and education. Patient reports fatigue about at baseline; gave verbal and written information regarding management. 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 Patient reports her BP medications were changed by the hospitalist upon discharge and her BP has been elevated since; patient is checking her BP daily and keeping a log. Advised patient to inform PCP of elevated BP so her medications can be adjusted by him if appropriate.

## 2022-11-08 NOTE — Nursing Note (Signed)
Triaged patient and placed in room #2. She has been down to visit the wig room and found a great match. Her blood pressure was 173/99 and HR was 102, she has records where she checks it daily and it has been running high. She is fatigued and weak. Her only complaint was that she has been dizzy a lot .

## 2022-11-12 ENCOUNTER — Encounter (INDEPENDENT_AMBULATORY_CARE_PROVIDER_SITE_OTHER): Payer: Self-pay | Admitting: HEMATOLOGY-ONCOLOGY

## 2022-11-13 ENCOUNTER — Encounter (INDEPENDENT_AMBULATORY_CARE_PROVIDER_SITE_OTHER): Payer: Self-pay | Admitting: HEMATOLOGY-ONCOLOGY

## 2022-11-14 ENCOUNTER — Encounter (INDEPENDENT_AMBULATORY_CARE_PROVIDER_SITE_OTHER): Payer: Self-pay | Admitting: HEMATOLOGY-ONCOLOGY

## 2022-11-14 ENCOUNTER — Telehealth (INDEPENDENT_AMBULATORY_CARE_PROVIDER_SITE_OTHER): Payer: Self-pay | Admitting: HEMATOLOGY-ONCOLOGY

## 2022-11-15 ENCOUNTER — Ambulatory Visit (INDEPENDENT_AMBULATORY_CARE_PROVIDER_SITE_OTHER)
Admission: RE | Admit: 2022-11-15 | Discharge: 2022-11-15 | Disposition: A | Discharge status: Home / Self Care | Payer: Medicare Other | Source: Ambulatory Visit | Attending: HEMATOLOGY-ONCOLOGY | Admitting: HEMATOLOGY-ONCOLOGY

## 2022-11-15 ENCOUNTER — Other Ambulatory Visit: Payer: Self-pay

## 2022-11-15 ENCOUNTER — Ambulatory Visit
Admission: RE | Admit: 2022-11-15 | Discharge: 2022-11-15 | Disposition: A | Discharge status: Home / Self Care | Payer: Medicare Other | Source: Ambulatory Visit | Attending: HEMATOLOGY-ONCOLOGY | Admitting: HEMATOLOGY-ONCOLOGY

## 2022-11-15 ENCOUNTER — Encounter (HOSPITAL_COMMUNITY): Payer: Self-pay

## 2022-11-15 VITALS — BP 161/83 | HR 85 | Temp 98.3°F | Resp 18 | Ht 64.0 in | Wt 207.2 lb

## 2022-11-15 DIAGNOSIS — C50919 Malignant neoplasm of unspecified site of unspecified female breast: Secondary | ICD-10-CM | POA: Insufficient documentation

## 2022-11-15 DIAGNOSIS — Z5112 Encounter for antineoplastic immunotherapy: Secondary | ICD-10-CM | POA: Insufficient documentation

## 2022-11-15 DIAGNOSIS — I82412 Acute embolism and thrombosis of left femoral vein: Secondary | ICD-10-CM

## 2022-11-15 DIAGNOSIS — Z5111 Encounter for antineoplastic chemotherapy: Secondary | ICD-10-CM | POA: Insufficient documentation

## 2022-11-15 LAB — COMPREHENSIVE METABOLIC PANEL, NON-FASTING
ALBUMIN/GLOBULIN RATIO: 1.2 (ref 0.8–1.4)
ALBUMIN: 3.8 g/dL (ref 3.5–5.7)
ALKALINE PHOSPHATASE: 127 U/L — ABNORMAL HIGH (ref 34–104)
ALT (SGPT): 16 U/L (ref 7–52)
ANION GAP: 6 mmol/L (ref 4–13)
AST (SGOT): 20 U/L (ref 13–39)
BILIRUBIN TOTAL: 0.8 mg/dL (ref 0.3–1.0)
BUN/CREA RATIO: 10 (ref 6–22)
BUN: 11 mg/dL (ref 7–25)
CALCIUM, CORRECTED: 11.2 mg/dL — ABNORMAL HIGH (ref 8.9–10.8)
CALCIUM: 11 mg/dL — ABNORMAL HIGH (ref 8.6–10.3)
CHLORIDE: 104 mmol/L (ref 98–107)
CO2 TOTAL: 30 mmol/L (ref 21–31)
CREATININE: 1.1 mg/dL (ref 0.60–1.30)
ESTIMATED GFR: 51 mL/min/{1.73_m2} — ABNORMAL LOW (ref >59)
GLOBULIN: 3.1 (ref 2.9–5.4)
GLUCOSE: 166 mg/dL — ABNORMAL HIGH (ref 74–109)
OSMOLALITY, CALCULATED: 283 mOsm/kg (ref 270–290)
POTASSIUM: 4.3 mmol/L (ref 3.5–5.1)
PROTEIN TOTAL: 6.9 g/dL (ref 6.4–8.9)
SODIUM: 140 mmol/L (ref 136–145)

## 2022-11-15 LAB — CBC WITH DIFF
BASOPHIL #: 0 10*3/uL (ref 0.00–0.10)
BASOPHIL %: 1 % (ref 0–1)
EOSINOPHIL #: 0 10*3/uL (ref 0.00–0.50)
EOSINOPHIL %: 1 % (ref 1–7)
HCT: 32 % (ref 31.2–41.9)
HGB: 10.8 g/dL — ABNORMAL LOW (ref 10.9–14.3)
LYMPHOCYTE #: 1.1 10*3/uL (ref 1.00–3.00)
LYMPHOCYTE %: 32 % (ref 16–44)
MCH: 29.4 pg (ref 24.7–32.8)
MCHC: 33.6 g/dL (ref 32.3–35.6)
MCV: 87.3 fL (ref 75.5–95.3)
MONOCYTE #: 0.3 10*3/uL (ref 0.30–1.00)
MONOCYTE %: 8 % (ref 5–13)
MPV: 9.2 fL (ref 7.9–10.8)
NEUTROPHIL #: 1.9 10*3/uL (ref 1.85–7.80)
NEUTROPHIL %: 58 % (ref 43–77)
PLATELETS: 202 10*3/uL (ref 140–440)
RBC: 3.66 10*6/uL (ref 3.63–4.92)
RDW: 14.7 % (ref 12.3–17.7)
WBC: 3.4 10*3/uL — ABNORMAL LOW (ref 3.8–11.8)

## 2022-11-15 LAB — MAGNESIUM: MAGNESIUM: 1.2 mg/dL — ABNORMAL LOW (ref 1.9–2.7)

## 2022-11-15 MED ORDER — DEXAMETHASONE SODIUM PHOSPHATE 4 MG/ML INJECTION SOLUTION
INTRAMUSCULAR | Status: AC
Start: 2022-11-15 — End: 2022-11-15
  Filled 2022-11-15: qty 1

## 2022-11-15 MED ORDER — SODIUM CHLORIDE 0.9 % INTRAVENOUS SOLUTION
2.0000 mg/kg | Freq: Once | INTRAVENOUS | Status: AC
Start: 2022-11-15 — End: 2022-11-15
  Administered 2022-11-15: 189 mg via INTRAVENOUS
  Administered 2022-11-15: 0 mg via INTRAVENOUS
  Filled 2022-11-15: qty 9

## 2022-11-15 MED ORDER — ALBUTEROL SULFATE 2.5 MG/3 ML (0.083 %) SOLUTION FOR NEBULIZATION
2.5000 mg | INHALATION_SOLUTION | Freq: Once | RESPIRATORY_TRACT | Status: DC | PRN
Start: 2022-11-15 — End: 2022-11-16

## 2022-11-15 MED ORDER — DIPHENHYDRAMINE 50 MG/ML INJECTION SOLUTION
25.0000 mg | Freq: Once | INTRAMUSCULAR | Status: AC
Start: 2022-11-15 — End: 2022-11-15
  Administered 2022-11-15: 25 mg via INTRAVENOUS

## 2022-11-15 MED ORDER — DIPHENHYDRAMINE 50 MG/ML INJECTION SOLUTION
50.0000 mg | Freq: Once | INTRAMUSCULAR | Status: DC | PRN
Start: 2022-11-15 — End: 2022-11-16

## 2022-11-15 MED ORDER — DIPHENHYDRAMINE 50 MG/ML INJECTION SOLUTION
25.0000 mg | Freq: Once | INTRAMUSCULAR | Status: DC | PRN
Start: 2022-11-15 — End: 2022-11-16

## 2022-11-15 MED ORDER — MAGNESIUM SULFATE 1 GRAM/100 ML IN DEXTROSE 5 % INTRAVENOUS PIGGYBACK
INJECTION | INTRAVENOUS | Status: AC
Start: 2022-11-15 — End: 2022-11-15
  Filled 2022-11-15: qty 400

## 2022-11-15 MED ORDER — DIPHENHYDRAMINE 50 MG/ML INJECTION SOLUTION
INTRAMUSCULAR | Status: AC
Start: 2022-11-15 — End: 2022-11-15
  Filled 2022-11-15: qty 1

## 2022-11-15 MED ORDER — MEPERIDINE (PF) 25 MG/ML INJECTION SOLUTION
12.5000 mg | Freq: Once | INTRAMUSCULAR | Status: DC | PRN
Start: 2022-11-15 — End: 2022-11-16

## 2022-11-15 MED ORDER — SODIUM CHLORIDE 0.9 % INTRAVENOUS SOLUTION
150.0000 mg | Freq: Once | INTRAVENOUS | Status: AC
Start: 2022-11-15 — End: 2022-11-15
  Administered 2022-11-15: 0 mg via INTRAVENOUS
  Administered 2022-11-15: 150 mg via INTRAVENOUS
  Filled 2022-11-15: qty 25

## 2022-11-15 MED ORDER — ONDANSETRON HCL 8 MG TABLET
16.0000 mg | ORAL_TABLET | Freq: Once | ORAL | Status: AC
Start: 2022-11-15 — End: 2022-11-15
  Administered 2022-11-15: 16 mg via ORAL

## 2022-11-15 MED ORDER — FAMOTIDINE (PF) 20 MG/2 ML INTRAVENOUS SOLUTION
INTRAVENOUS | Status: AC
Start: 2022-11-15 — End: 2022-11-15
  Filled 2022-11-15: qty 2

## 2022-11-15 MED ORDER — EPINEPHRINE 1 MG/ML (1 ML) INJECTION SOLUTION
0.3000 mg | Freq: Once | INTRAMUSCULAR | Status: DC | PRN
Start: 2022-11-15 — End: 2022-11-16

## 2022-11-15 MED ORDER — FAMOTIDINE (PF) 20 MG/2 ML INTRAVENOUS SOLUTION
20.0000 mg | Freq: Once | INTRAVENOUS | Status: DC | PRN
Start: 2022-11-15 — End: 2022-11-16

## 2022-11-15 MED ORDER — DEXAMETHASONE SODIUM PHOSPHATE 4 MG/ML INJECTION SOLUTION
4.0000 mg | Freq: Once | INTRAMUSCULAR | Status: AC
Start: 2022-11-15 — End: 2022-11-15
  Administered 2022-11-15: 4 mg via INTRAVENOUS

## 2022-11-15 MED ORDER — FAMOTIDINE (PF) 20 MG/2 ML INTRAVENOUS SOLUTION
20.0000 mg | Freq: Once | INTRAVENOUS | Status: AC
Start: 2022-11-15 — End: 2022-11-15
  Administered 2022-11-15: 20 mg via INTRAVENOUS

## 2022-11-15 MED ORDER — MAGNESIUM SULFATE 1 GRAM/100 ML IN DEXTROSE 5 % INTRAVENOUS PIGGYBACK
1.0000 g | INJECTION | INTRAVENOUS | Status: AC
Start: 2022-11-15 — End: 2022-11-15
  Administered 2022-11-15 (×3): 0 g via INTRAVENOUS
  Administered 2022-11-15 (×3): 1 g via INTRAVENOUS
  Administered 2022-11-15: 0 g via INTRAVENOUS
  Administered 2022-11-15: 1 g via INTRAVENOUS

## 2022-11-15 MED ORDER — ONDANSETRON HCL 8 MG TABLET
ORAL_TABLET | ORAL | Status: AC
Start: 2022-11-15 — End: 2022-11-15
  Filled 2022-11-15: qty 2

## 2022-11-15 MED ORDER — ALBUTEROL SULFATE HFA 90 MCG/ACTUATION AEROSOL INHALER - RN
2.0000 | Freq: Once | RESPIRATORY_TRACT | Status: DC | PRN
Start: 2022-11-15 — End: 2022-11-16

## 2022-11-15 MED ORDER — SODIUM CHLORIDE 0.9% FLUSH BAG - 250 ML
INTRAVENOUS | Status: DC | PRN
Start: 2022-11-15 — End: 2022-11-16

## 2022-11-15 MED ORDER — DEXTROSE 5% IN WATER (D5W) FLUSH BAG - 250 ML
INTRAVENOUS | Status: AC | PRN
Start: 2022-11-15 — End: ?

## 2022-11-15 MED ORDER — HYDROCORTISONE SOD SUCCINATE 100 MG/2 ML VIAL WRAPPER
100.0000 mg | Freq: Once | INTRAMUSCULAR | Status: DC | PRN
Start: 2022-11-15 — End: 2022-11-16

## 2022-11-15 NOTE — Nurses Notes (Signed)
1610 - Patient to room via wheelchair with family for treatment today. Assessment complete. Lungs clear throughout lung fields. Abdomen soft and non-tender, bowel sounds present. No edema noted. Patient denies any pain or discomfort at this time.  Carolynn Serve, RN  Patient Assessment/Symptom Management Patient Has No MD Appointment Today   Key: (+) Symptom present           (-)  Symptom not present If Symptom is Positive(+) a Nursing Note is required   Edema -   Uncontrolled Nausea -   Vomiting -   Inability to eat/drink -   Mouth Sores -   Diarrhea -   Constipation (? Last BM) -   Fatigue that interferes with ADL's -   Numbness/Tingling -change -   Other -   Fever/Signs & Symptoms of infection -   Nurse Initials JD   0900 - PAC accessed, excellent blood return noted. Carolynn Serve, RN  [phone] - Premedications Zofran, Decadron, Benadryl, and Pepcid given. Carolynn Serve, RN  [phone] - 4 grams of Magnesium ordered for Magnesium of 1.3. 1st Magnesium rider started. Carolynn Serve, RN  [phone] - Kanjinti infusion started. Carolynn Serve, RN  [phone] - 1st Magnesium rider complete. Line flushing. Carolynn Serve, RN  [phone] - 2nd Magnesium rider started. Carolynn Serve, RN  1004 - Kanjinti infusion complete. Line flushing. Carolynn Serve, RN  [phone]  - 2nd Magnesium rider complete. Carolynn Serve, RN  1019 - Taxol infusion and 3rd Magnesium rider started. Carolynn Serve, RN  [phone] - 3rd Magnesium rider complete. Carolynn Serve, RN  [phone] - 4th Magnesium rider started. Carolynn Serve, RN  [phone] - Taxol infusion complete. Line flushing. Carolynn Serve, RN  [phone] - 4th Magnesium rider complete. Line flushing. Carolynn Serve, RN  1150 - PAC flushed with NS and deaccessed. Gauze dressing and adhesive bandage applied. Carolynn Serve, RN  [phone] - Patient left via wheelchair with family at this time. Carolynn Serve, RN

## 2022-11-19 ENCOUNTER — Encounter (INDEPENDENT_AMBULATORY_CARE_PROVIDER_SITE_OTHER): Payer: Self-pay | Admitting: HEMATOLOGY-ONCOLOGY

## 2022-11-21 ENCOUNTER — Other Ambulatory Visit (INDEPENDENT_AMBULATORY_CARE_PROVIDER_SITE_OTHER): Payer: Self-pay | Admitting: HEMATOLOGY-ONCOLOGY

## 2022-11-22 ENCOUNTER — Other Ambulatory Visit: Payer: Self-pay

## 2022-11-22 ENCOUNTER — Ambulatory Visit (HOSPITAL_COMMUNITY): Payer: Medicare Other

## 2022-11-22 ENCOUNTER — Encounter (HOSPITAL_COMMUNITY): Payer: Self-pay

## 2022-11-22 ENCOUNTER — Other Ambulatory Visit (INDEPENDENT_AMBULATORY_CARE_PROVIDER_SITE_OTHER): Payer: Self-pay

## 2022-11-22 ENCOUNTER — Ambulatory Visit
Admission: RE | Admit: 2022-11-22 | Discharge: 2022-11-22 | Disposition: A | Discharge status: Home / Self Care | Payer: Medicare Other | Source: Ambulatory Visit | Attending: HEMATOLOGY-ONCOLOGY | Admitting: HEMATOLOGY-ONCOLOGY

## 2022-11-22 VITALS — BP 169/97 | HR 85 | Temp 98.6°F | Resp 20 | Ht 64.0 in | Wt 205.2 lb

## 2022-11-22 DIAGNOSIS — I82412 Acute embolism and thrombosis of left femoral vein: Secondary | ICD-10-CM | POA: Insufficient documentation

## 2022-11-22 DIAGNOSIS — C50919 Malignant neoplasm of unspecified site of unspecified female breast: Secondary | ICD-10-CM | POA: Insufficient documentation

## 2022-11-22 DIAGNOSIS — Z5112 Encounter for antineoplastic immunotherapy: Secondary | ICD-10-CM | POA: Insufficient documentation

## 2022-11-22 DIAGNOSIS — Z5111 Encounter for antineoplastic chemotherapy: Secondary | ICD-10-CM | POA: Insufficient documentation

## 2022-11-22 LAB — CBC WITH DIFF
BASOPHIL #: 0 10*3/uL (ref 0.00–0.10)
BASOPHIL %: 1 % (ref 0–1)
EOSINOPHIL #: 0.1 10*3/uL (ref 0.00–0.50)
EOSINOPHIL %: 2 % (ref 1–7)
HCT: 30.4 % — ABNORMAL LOW (ref 31.2–41.9)
HGB: 10.2 g/dL — ABNORMAL LOW (ref 10.9–14.3)
LYMPHOCYTE #: 0.9 10*3/uL — ABNORMAL LOW (ref 1.00–3.00)
LYMPHOCYTE %: 26 % (ref 16–44)
MCH: 29.2 pg (ref 24.7–32.8)
MCHC: 33.6 g/dL (ref 32.3–35.6)
MCV: 87 fL (ref 75.5–95.3)
MONOCYTE #: 0.3 10*3/uL (ref 0.30–1.00)
MONOCYTE %: 8 % (ref 5–13)
MPV: 9 fL (ref 7.9–10.8)
NEUTROPHIL #: 2.2 10*3/uL (ref 1.85–7.80)
NEUTROPHIL %: 62 % (ref 43–77)
PLATELETS: 179 10*3/uL (ref 140–440)
RBC: 3.49 10*6/uL — ABNORMAL LOW (ref 3.63–4.92)
RDW: 15.4 % (ref 12.3–17.7)
WBC: 3.5 10*3/uL — ABNORMAL LOW (ref 3.8–11.8)

## 2022-11-22 LAB — MAGNESIUM: MAGNESIUM: 1.1 mg/dL — ABNORMAL LOW (ref 1.9–2.7)

## 2022-11-22 MED ORDER — MEPERIDINE (PF) 25 MG/ML INJECTION SOLUTION
12.5000 mg | Freq: Once | INTRAMUSCULAR | Status: DC | PRN
Start: 2022-11-22 — End: 2022-11-23

## 2022-11-22 MED ORDER — ONDANSETRON HCL 8 MG TABLET
16.0000 mg | ORAL_TABLET | Freq: Once | ORAL | Status: AC
Start: 2022-11-22 — End: 2022-11-22
  Administered 2022-11-22: 16 mg via ORAL

## 2022-11-22 MED ORDER — SODIUM CHLORIDE 0.9 % INTRAVENOUS SOLUTION
150.0000 mg | Freq: Once | INTRAVENOUS | Status: AC
Start: 2022-11-22 — End: 2022-11-22
  Administered 2022-11-22: 0 mg via INTRAVENOUS
  Administered 2022-11-22: 150 mg via INTRAVENOUS
  Filled 2022-11-22: qty 25

## 2022-11-22 MED ORDER — DEXAMETHASONE SODIUM PHOSPHATE 4 MG/ML INJECTION SOLUTION
4.0000 mg | Freq: Once | INTRAMUSCULAR | Status: AC
Start: 2022-11-22 — End: 2022-11-22
  Administered 2022-11-22: 4 mg via INTRAVENOUS

## 2022-11-22 MED ORDER — DIPHENHYDRAMINE 50 MG/ML INJECTION SOLUTION
25.0000 mg | Freq: Once | INTRAMUSCULAR | Status: AC
Start: 2022-11-22 — End: 2022-11-22
  Administered 2022-11-22: 25 mg via INTRAVENOUS

## 2022-11-22 MED ORDER — DIPHENHYDRAMINE 50 MG/ML INJECTION SOLUTION
25.0000 mg | Freq: Once | INTRAMUSCULAR | Status: DC | PRN
Start: 2022-11-22 — End: 2022-11-23

## 2022-11-22 MED ORDER — DIPHENHYDRAMINE 50 MG/ML INJECTION SOLUTION
INTRAMUSCULAR | Status: AC
Start: 2022-11-22 — End: 2022-11-22
  Filled 2022-11-22: qty 1

## 2022-11-22 MED ORDER — SODIUM CHLORIDE 0.9% FLUSH BAG - 250 ML
INTRAVENOUS | Status: DC | PRN
Start: 2022-11-22 — End: 2022-11-23

## 2022-11-22 MED ORDER — FAMOTIDINE (PF) 20 MG/2 ML INTRAVENOUS SOLUTION
20.0000 mg | Freq: Once | INTRAVENOUS | Status: DC | PRN
Start: 2022-11-22 — End: 2022-11-23

## 2022-11-22 MED ORDER — ONDANSETRON HCL 8 MG TABLET
ORAL_TABLET | ORAL | Status: AC
Start: 2022-11-22 — End: 2022-11-22
  Filled 2022-11-22: qty 2

## 2022-11-22 MED ORDER — DIPHENHYDRAMINE 50 MG/ML INJECTION SOLUTION
50.0000 mg | Freq: Once | INTRAMUSCULAR | Status: DC | PRN
Start: 2022-11-22 — End: 2022-11-23

## 2022-11-22 MED ORDER — MAGNESIUM SULFATE 1 GRAM/100 ML IN DEXTROSE 5 % INTRAVENOUS PIGGYBACK
1.0000 g | INJECTION | INTRAVENOUS | Status: AC
Start: 2022-11-22 — End: 2022-11-22
  Administered 2022-11-22: 0 g via INTRAVENOUS
  Administered 2022-11-22: 1 g via INTRAVENOUS
  Administered 2022-11-22: 0 g via INTRAVENOUS
  Administered 2022-11-22: 1 g via INTRAVENOUS
  Administered 2022-11-22 (×2): 0 g via INTRAVENOUS
  Administered 2022-11-22 (×2): 1 g via INTRAVENOUS

## 2022-11-22 MED ORDER — EPINEPHRINE 1 MG/ML (1 ML) INJECTION SOLUTION
0.3000 mg | Freq: Once | INTRAMUSCULAR | Status: DC | PRN
Start: 2022-11-22 — End: 2022-11-23

## 2022-11-22 MED ORDER — ALBUTEROL SULFATE 2.5 MG/3 ML (0.083 %) SOLUTION FOR NEBULIZATION
2.5000 mg | INHALATION_SOLUTION | Freq: Once | RESPIRATORY_TRACT | Status: DC | PRN
Start: 2022-11-22 — End: 2022-11-23

## 2022-11-22 MED ORDER — DEXTROSE 5% IN WATER (D5W) FLUSH BAG - 250 ML
INTRAVENOUS | Status: DC | PRN
Start: 2022-11-22 — End: 2022-11-23

## 2022-11-22 MED ORDER — TRASTUZUMAB-ANNS 150 MG INTRAVENOUS SOLUTION
2.0000 mg/kg | Freq: Once | INTRAVENOUS | Status: AC
Start: 2022-11-22 — End: 2022-11-22
  Administered 2022-11-22: 0 mg via INTRAVENOUS
  Administered 2022-11-22: 189 mg via INTRAVENOUS
  Filled 2022-11-22: qty 9

## 2022-11-22 MED ORDER — FAMOTIDINE (PF) 20 MG/2 ML INTRAVENOUS SOLUTION
20.0000 mg | Freq: Once | INTRAVENOUS | Status: AC
Start: 2022-11-22 — End: 2022-11-22
  Administered 2022-11-22: 20 mg via INTRAVENOUS

## 2022-11-22 MED ORDER — HYDROCORTISONE SOD SUCCINATE 100 MG/2 ML VIAL WRAPPER
100.0000 mg | Freq: Once | INTRAMUSCULAR | Status: DC | PRN
Start: 2022-11-22 — End: 2022-11-23

## 2022-11-22 MED ORDER — DEXAMETHASONE SODIUM PHOSPHATE 4 MG/ML INJECTION SOLUTION
INTRAMUSCULAR | Status: AC
Start: 2022-11-22 — End: 2022-11-22
  Filled 2022-11-22: qty 1

## 2022-11-22 MED ORDER — ALBUTEROL SULFATE HFA 90 MCG/ACTUATION AEROSOL INHALER - RN
2.0000 | Freq: Once | RESPIRATORY_TRACT | Status: DC | PRN
Start: 2022-11-22 — End: 2022-11-23

## 2022-11-22 MED ORDER — MAGNESIUM SULFATE 1 GRAM/100 ML IN DEXTROSE 5 % INTRAVENOUS PIGGYBACK
INJECTION | INTRAVENOUS | Status: AC
Start: 2022-11-22 — End: 2022-11-22
  Filled 2022-11-22: qty 400

## 2022-11-22 MED ORDER — FAMOTIDINE (PF) 20 MG/2 ML INTRAVENOUS SOLUTION
INTRAVENOUS | Status: AC
Start: 2022-11-22 — End: 2022-11-22
  Filled 2022-11-22: qty 2

## 2022-11-22 NOTE — Nurses Notes (Addendum)
0917-Arrived to [facility] infusion center in wheelchair.  Accompanied by spouse.  Here for Kanjinti and Taxol infusions.  To room 220 at this time. Junie Panning, RN  0931-Seated in vascular chair with wheels locked.  Awake, alert and oriented x 4.  Pleasant affect.  Slightly hard of hearing. Spouse seated beside pt.  Does verbalize c/o fatigue.  Food does not taste good, per pt.  Suggested adding something sour like lemon or lime to food, and to use plastic ware instead of metal to eat with.  Also stated BLE seem to be weaker and she has developed numbness and tingling in feet.  No other voiced complaints offered.  HRR.  Breath sounds diminished in left base posteriorly.  Abdomen soft and non-tender with active bowel sounds noted in all four quadrants.  No acute distress noted.  VSS. Junie Panning, RN  0940-Right upper chest PortACath accessed per protocol.  Excellent blood return noted.  Flushed without difficulty.  10ml blood wasted.  Then labs obtained from port.  Site dressed with transparent, non-bordered, dressing.  Tolerated well.  Labs sent to main hospital lab through tube system. Junie Panning, RN  1025-Magnesium 1.1.  Pt to receive Magnesium Sulfate 4 grams IV per protocol. Junie Panning, RN  1112-Pepcid 20 mg IVP administered per premedication order. Junie Panning, RN  1114-Dexamethasone 4mg  IVP administered per premedication order. Junie Panning, RN  1117-Benadryl 25 mg IVP administered per premedication order. Junie Panning, RN  1119-Zofran 16mg  PO administered per premedication order. Junie Panning, RN  1200-Taxol 150mg  IV infusion started. Junie Panning, RN  1201-Magnesium 4 grams IV infusion started. Junie Panning, RN  1300-Taxol infusion completed. Tolerated well. Junie Panning, RN  1404-Magnesium infusion complete.  Tolerated well. Junie Panning, RN  1434-Left OP ONC infusion center in wheelchair.  Accompanied by daughter and daughter in-law.  No s/s of distress noted. Junie Panning, RN

## 2022-11-26 ENCOUNTER — Encounter (INDEPENDENT_AMBULATORY_CARE_PROVIDER_SITE_OTHER): Payer: Self-pay | Admitting: HEMATOLOGY-ONCOLOGY

## 2022-11-27 ENCOUNTER — Encounter (INDEPENDENT_AMBULATORY_CARE_PROVIDER_SITE_OTHER): Payer: Self-pay | Admitting: HEMATOLOGY-ONCOLOGY

## 2022-11-27 ENCOUNTER — Other Ambulatory Visit (INDEPENDENT_AMBULATORY_CARE_PROVIDER_SITE_OTHER): Payer: Self-pay | Admitting: HEMATOLOGY-ONCOLOGY

## 2022-11-27 ENCOUNTER — Inpatient Hospital Stay
Admission: RE | Admit: 2022-11-27 | Discharge: 2022-11-27 | Disposition: A | Discharge status: Home / Self Care | Payer: Medicare Other | Source: Ambulatory Visit | Attending: HEMATOLOGY-ONCOLOGY | Admitting: HEMATOLOGY-ONCOLOGY

## 2022-11-27 ENCOUNTER — Other Ambulatory Visit: Payer: Self-pay

## 2022-11-27 DIAGNOSIS — R040 Epistaxis: Secondary | ICD-10-CM | POA: Insufficient documentation

## 2022-11-27 LAB — CBC WITH DIFF
BASOPHIL #: 0 10*3/uL (ref 0.00–0.10)
BASOPHIL %: 1 % (ref 0–1)
EOSINOPHIL #: 0.1 10*3/uL (ref 0.00–0.50)
EOSINOPHIL %: 3 % (ref 1–7)
HCT: 30.8 % — ABNORMAL LOW (ref 31.2–41.9)
HGB: 10.5 g/dL — ABNORMAL LOW (ref 10.9–14.3)
LYMPHOCYTE #: 0.9 10*3/uL — ABNORMAL LOW (ref 1.00–3.00)
LYMPHOCYTE %: 26 % (ref 16–44)
MCH: 29.5 pg (ref 24.7–32.8)
MCHC: 34.1 g/dL (ref 32.3–35.6)
MCV: 86.5 fL (ref 75.5–95.3)
MONOCYTE #: 0.2 10*3/uL — ABNORMAL LOW (ref 0.30–1.00)
MONOCYTE %: 5 % (ref 5–13)
MPV: 9.1 fL (ref 7.9–10.8)
NEUTROPHIL #: 2.2 10*3/uL (ref 1.85–7.80)
NEUTROPHIL %: 65 % (ref 43–77)
PLATELETS: 188 10*3/uL (ref 140–440)
RBC: 3.56 10*6/uL — ABNORMAL LOW (ref 3.63–4.92)
RDW: 15.7 % (ref 12.3–17.7)
WBC: 3.3 10*3/uL — ABNORMAL LOW (ref 3.8–11.8)

## 2022-11-27 LAB — COMPREHENSIVE METABOLIC PANEL, NON-FASTING
ALBUMIN/GLOBULIN RATIO: 1.2 (ref 0.8–1.4)
ALBUMIN: 3.6 g/dL (ref 3.5–5.7)
ALKALINE PHOSPHATASE: 135 U/L — ABNORMAL HIGH (ref 34–104)
ALT (SGPT): 13 U/L (ref 7–52)
ANION GAP: 6 mmol/L (ref 4–13)
AST (SGOT): 18 U/L (ref 13–39)
BILIRUBIN TOTAL: 1.1 mg/dL — ABNORMAL HIGH (ref 0.3–1.0)
BUN/CREA RATIO: 9 (ref 6–22)
BUN: 8 mg/dL (ref 7–25)
CALCIUM, CORRECTED: 10.3 mg/dL (ref 8.9–10.8)
CALCIUM: 10 mg/dL (ref 8.6–10.3)
CHLORIDE: 106 mmol/L (ref 98–107)
CO2 TOTAL: 29 mmol/L (ref 21–31)
CREATININE: 0.86 mg/dL (ref 0.60–1.30)
ESTIMATED GFR: 68 mL/min/{1.73_m2} (ref >59)
GLOBULIN: 3 (ref 2.9–5.4)
GLUCOSE: 153 mg/dL — ABNORMAL HIGH (ref 74–109)
OSMOLALITY, CALCULATED: 283 mOsm/kg (ref 270–290)
POTASSIUM: 4 mmol/L (ref 3.5–5.1)
PROTEIN TOTAL: 6.6 g/dL (ref 6.4–8.9)
SODIUM: 141 mmol/L (ref 136–145)

## 2022-11-27 NOTE — Nursing Note (Signed)
Received a telephone call from patient's daughter reporting that for the past 3 days patient has been experiencing mild nosebleeds that do stop after 5 minutes.  Patient remains on Xarelto daily. Daughter is also reporting that patient's blood pressure has been elevated, it is 170/90 today.  Daughter reports that patient's blood pressure medication was placed on hold after she was discharged from the hospital and her blood pressure has been elevated since.  Advised daughter to bring patient in for lab work today, advised to go to ER for nosebleed that does not stop after 15 minutes of pressure.  Advised to contact PCP to discuss elevated blood pressure and whether or not blood pressure medication should be resumed.

## 2022-11-28 ENCOUNTER — Encounter (INDEPENDENT_AMBULATORY_CARE_PROVIDER_SITE_OTHER): Payer: Self-pay | Admitting: HEMATOLOGY-ONCOLOGY

## 2022-11-28 NOTE — Nursing Note (Signed)
Attempted to return telephone call to both patient and daughter to review lab results and discuss nose bleeds; no answer, left voice message with return telephone number to discuss on both lines.

## 2022-11-29 ENCOUNTER — Ambulatory Visit
Admission: RE | Admit: 2022-11-29 | Discharge: 2022-11-29 | Disposition: A | Discharge status: Home / Self Care | Payer: Medicare Other | Source: Ambulatory Visit | Attending: HEMATOLOGY-ONCOLOGY | Admitting: HEMATOLOGY-ONCOLOGY

## 2022-11-29 ENCOUNTER — Other Ambulatory Visit: Payer: Self-pay

## 2022-11-29 ENCOUNTER — Encounter (HOSPITAL_COMMUNITY): Payer: Self-pay

## 2022-11-29 ENCOUNTER — Inpatient Hospital Stay (INDEPENDENT_AMBULATORY_CARE_PROVIDER_SITE_OTHER)
Admission: RE | Admit: 2022-11-29 | Discharge: 2022-11-29 | Disposition: A | Discharge status: Home / Self Care | Payer: Medicare Other | Source: Ambulatory Visit | Attending: HEMATOLOGY-ONCOLOGY

## 2022-11-29 VITALS — BP 149/82 | HR 92 | Temp 98.7°F | Resp 18 | Wt 207.2 lb

## 2022-11-29 DIAGNOSIS — I82412 Acute embolism and thrombosis of left femoral vein: Secondary | ICD-10-CM

## 2022-11-29 DIAGNOSIS — Z5111 Encounter for antineoplastic chemotherapy: Secondary | ICD-10-CM | POA: Insufficient documentation

## 2022-11-29 DIAGNOSIS — C50919 Malignant neoplasm of unspecified site of unspecified female breast: Secondary | ICD-10-CM | POA: Insufficient documentation

## 2022-11-29 DIAGNOSIS — Z5112 Encounter for antineoplastic immunotherapy: Secondary | ICD-10-CM | POA: Insufficient documentation

## 2022-11-29 LAB — CBC WITH DIFF
BASOPHIL #: 0 10*3/uL (ref 0.00–0.10)
BASOPHIL %: 1 % (ref 0–1)
EOSINOPHIL #: 0.1 10*3/uL (ref 0.00–0.50)
EOSINOPHIL %: 3 % (ref 1–7)
HCT: 30.9 % — ABNORMAL LOW (ref 31.2–41.9)
HGB: 10.3 g/dL — ABNORMAL LOW (ref 10.9–14.3)
LYMPHOCYTE #: 1.1 10*3/uL (ref 1.00–3.00)
LYMPHOCYTE %: 23 % (ref 16–44)
MCH: 29 pg (ref 24.7–32.8)
MCHC: 33.3 g/dL (ref 32.3–35.6)
MCV: 86.9 fL (ref 75.5–95.3)
MONOCYTE #: 0.4 10*3/uL (ref 0.30–1.00)
MONOCYTE %: 9 % (ref 5–13)
MPV: 8.9 fL (ref 7.9–10.8)
NEUTROPHIL #: 3 10*3/uL (ref 1.85–7.80)
NEUTROPHIL %: 65 % (ref 43–77)
PLATELETS: 210 10*3/uL (ref 140–440)
RBC: 3.55 10*6/uL — ABNORMAL LOW (ref 3.63–4.92)
RDW: 16 % (ref 12.3–17.7)
WBC: 4.6 10*3/uL (ref 3.8–11.8)

## 2022-11-29 LAB — MAGNESIUM: MAGNESIUM: 1.1 mg/dL — ABNORMAL LOW (ref 1.9–2.7)

## 2022-11-29 MED ORDER — ONDANSETRON HCL 8 MG TABLET
ORAL_TABLET | ORAL | Status: AC
Start: 2022-11-29 — End: 2022-11-29
  Filled 2022-11-29: qty 2

## 2022-11-29 MED ORDER — DIPHENHYDRAMINE 50 MG/ML INJECTION SOLUTION
50.0000 mg | Freq: Once | INTRAMUSCULAR | Status: DC | PRN
Start: 2022-11-29 — End: 2022-11-30

## 2022-11-29 MED ORDER — SODIUM CHLORIDE 0.9% FLUSH BAG - 250 ML
INTRAVENOUS | Status: DC | PRN
Start: 2022-11-29 — End: 2022-11-30

## 2022-11-29 MED ORDER — FAMOTIDINE (PF) 20 MG/2 ML INTRAVENOUS SOLUTION
20.0000 mg | Freq: Once | INTRAVENOUS | Status: AC
Start: 2022-11-29 — End: 2022-11-29
  Administered 2022-11-29: 20 mg via INTRAVENOUS

## 2022-11-29 MED ORDER — SODIUM CHLORIDE 0.9 % INTRAVENOUS SOLUTION
150.0000 mg | Freq: Once | INTRAVENOUS | Status: AC
Start: 2022-11-29 — End: 2022-11-29
  Administered 2022-11-29: 150 mg via INTRAVENOUS
  Administered 2022-11-29: 0 mg via INTRAVENOUS
  Filled 2022-11-29: qty 25

## 2022-11-29 MED ORDER — EPINEPHRINE 1 MG/ML (1 ML) INJECTION SOLUTION
0.3000 mg | Freq: Once | INTRAMUSCULAR | Status: DC | PRN
Start: 2022-11-29 — End: 2022-11-30

## 2022-11-29 MED ORDER — HYDROCORTISONE SOD SUCCINATE 100 MG/2 ML VIAL WRAPPER
100.0000 mg | Freq: Once | INTRAMUSCULAR | Status: DC | PRN
Start: 2022-11-29 — End: 2022-11-30

## 2022-11-29 MED ORDER — ALBUTEROL SULFATE HFA 90 MCG/ACTUATION AEROSOL INHALER - RN
2.0000 | Freq: Once | RESPIRATORY_TRACT | Status: DC | PRN
Start: 2022-11-29 — End: 2022-11-30

## 2022-11-29 MED ORDER — DEXTROSE 5% IN WATER (D5W) FLUSH BAG - 250 ML
INTRAVENOUS | Status: DC | PRN
Start: 2022-11-29 — End: 2022-11-30

## 2022-11-29 MED ORDER — DEXAMETHASONE SODIUM PHOSPHATE 4 MG/ML INJECTION SOLUTION
4.0000 mg | Freq: Once | INTRAMUSCULAR | Status: AC
Start: 2022-11-29 — End: 2022-11-29
  Administered 2022-11-29: 4 mg via INTRAVENOUS

## 2022-11-29 MED ORDER — FAMOTIDINE (PF) 20 MG/2 ML INTRAVENOUS SOLUTION
20.0000 mg | Freq: Once | INTRAVENOUS | Status: DC | PRN
Start: 2022-11-29 — End: 2022-11-30

## 2022-11-29 MED ORDER — MEPERIDINE (PF) 25 MG/ML INJECTION SOLUTION
12.5000 mg | Freq: Once | INTRAMUSCULAR | Status: DC | PRN
Start: 2022-11-29 — End: 2022-11-30

## 2022-11-29 MED ORDER — DIPHENHYDRAMINE 50 MG/ML INJECTION SOLUTION
25.0000 mg | Freq: Once | INTRAMUSCULAR | Status: AC
Start: 2022-11-29 — End: 2022-11-29
  Administered 2022-11-29: 25 mg via INTRAVENOUS

## 2022-11-29 MED ORDER — DIPHENHYDRAMINE 50 MG/ML INJECTION SOLUTION
25.0000 mg | Freq: Once | INTRAMUSCULAR | Status: DC | PRN
Start: 2022-11-29 — End: 2022-11-30

## 2022-11-29 MED ORDER — MAGNESIUM SULFATE 1 GRAM/100 ML IN DEXTROSE 5 % INTRAVENOUS PIGGYBACK
1.0000 g | INJECTION | INTRAVENOUS | Status: AC
Start: 2022-11-29 — End: 2022-11-29
  Administered 2022-11-29: 1 g via INTRAVENOUS
  Administered 2022-11-29 (×2): 0 g via INTRAVENOUS
  Administered 2022-11-29: 1 g via INTRAVENOUS
  Administered 2022-11-29: 0 g via INTRAVENOUS
  Administered 2022-11-29: 1 g via INTRAVENOUS
  Administered 2022-11-29: 0 g via INTRAVENOUS
  Administered 2022-11-29: 1 g via INTRAVENOUS

## 2022-11-29 MED ORDER — DEXAMETHASONE SODIUM PHOSPHATE 4 MG/ML INJECTION SOLUTION
INTRAMUSCULAR | Status: AC
Start: 2022-11-29 — End: 2022-11-29
  Filled 2022-11-29: qty 1

## 2022-11-29 MED ORDER — ONDANSETRON HCL 8 MG TABLET
16.0000 mg | ORAL_TABLET | Freq: Once | ORAL | Status: AC
Start: 2022-11-29 — End: 2022-11-29
  Administered 2022-11-29: 16 mg via ORAL

## 2022-11-29 MED ORDER — MAGNESIUM SULFATE 1 GRAM/100 ML IN DEXTROSE 5 % INTRAVENOUS PIGGYBACK
INJECTION | INTRAVENOUS | Status: AC
Start: 2022-11-29 — End: 2022-11-29
  Filled 2022-11-29: qty 400

## 2022-11-29 MED ORDER — FAMOTIDINE (PF) 20 MG/2 ML INTRAVENOUS SOLUTION
INTRAVENOUS | Status: AC
Start: 2022-11-29 — End: 2022-11-29
  Filled 2022-11-29: qty 2

## 2022-11-29 MED ORDER — ALBUTEROL SULFATE 2.5 MG/3 ML (0.083 %) SOLUTION FOR NEBULIZATION
2.5000 mg | INHALATION_SOLUTION | Freq: Once | RESPIRATORY_TRACT | Status: DC | PRN
Start: 2022-11-29 — End: 2022-11-30

## 2022-11-29 MED ORDER — TRASTUZUMAB-ANNS 150 MG INTRAVENOUS SOLUTION
2.0000 mg/kg | Freq: Once | INTRAVENOUS | Status: AC
Start: 2022-11-29 — End: 2022-11-29
  Administered 2022-11-29: 0 mg via INTRAVENOUS
  Administered 2022-11-29: 189 mg via INTRAVENOUS
  Filled 2022-11-29: qty 9

## 2022-11-29 MED ORDER — DIPHENHYDRAMINE 50 MG/ML INJECTION SOLUTION
INTRAMUSCULAR | Status: AC
Start: 2022-11-29 — End: 2022-11-29
  Filled 2022-11-29: qty 1

## 2022-11-29 NOTE — Nurses Notes (Signed)
1884 - Patient to room via wheelchair with family for treatment today. Assessment complete. Lungs clear throughout lung fields. Abdomen soft and non-tender, bowel sounds present. No edema noted. Patient denies any pain or discomfort at this time.  Carolynn Serve, RN  Patient Assessment/Symptom Management Patient Has No MD Appointment Today   Key: (+) Symptom present           (-)  Symptom not present If Symptom is Positive(+) a Nursing Note is required   Edema -   Uncontrolled Nausea -   Vomiting -   Inability to eat/drink -   Mouth Sores -   Diarrhea -   Constipation (? Last BM) -   Fatigue that interferes with ADL's -   Numbness/Tingling -change -   Other -   Fever/Signs & Symptoms of infection -   Nurse Initials JD   [phone] - PAC accessed, excellent blood return noted. Carolynn Serve, RN  [phone] - Premedications Zofran, Decadron, Benadryl, and Pepcid given. Carolynn Serve, RN  1013 - 4 grams of Magnesium ordered for Magnesium of 1.1. 1st Magnesium rider started. Carolynn Serve, RN  [phone] - Kanjinti infusion started. Carolynn Serve, RN  [phone] - 1st Magnesium rider complete. Line flushing. Carolynn Serve, RN  [phone] - 2nd Magnesium rider started. Carolynn Serve, RN  [phone] - Kanjinti infusion complete. Line flushing. Carolynn Serve, RN  [phone]  - 2nd Magnesium rider complete. Carolynn Serve, RN  [phone] - Taxol infusion and 3rd Magnesium rider started. Carolynn Serve, RN  [phone] - 3rd Magnesium rider complete. Carolynn Serve, RN  [phone] - 4th Magnesium rider started. Carolynn Serve, RN  [phone] - Taxol infusion complete. Line flushing. 4th Magnesium rider complete. Line flushing. Carolynn Serve, RN  1240 - PAC flushed with NS and deaccessed. Gauze dressing and adhesive bandage applied. Carolynn Serve, RN  [phone] - Patient left via wheelchair with family at this time. Carolynn Serve, RN

## 2022-12-05 ENCOUNTER — Encounter (INDEPENDENT_AMBULATORY_CARE_PROVIDER_SITE_OTHER): Payer: Self-pay | Admitting: HEMATOLOGY-ONCOLOGY

## 2022-12-06 ENCOUNTER — Encounter (INDEPENDENT_AMBULATORY_CARE_PROVIDER_SITE_OTHER): Payer: Self-pay | Admitting: HEMATOLOGY-ONCOLOGY

## 2022-12-06 ENCOUNTER — Ambulatory Visit (INDEPENDENT_AMBULATORY_CARE_PROVIDER_SITE_OTHER)
Admission: RE | Admit: 2022-12-06 | Discharge: 2022-12-06 | Disposition: A | Discharge status: Home / Self Care | Payer: Medicare Other | Source: Ambulatory Visit | Attending: HEMATOLOGY-ONCOLOGY | Admitting: HEMATOLOGY-ONCOLOGY

## 2022-12-06 ENCOUNTER — Ambulatory Visit (INDEPENDENT_AMBULATORY_CARE_PROVIDER_SITE_OTHER): Payer: Medicare Other | Admitting: HEMATOLOGY-ONCOLOGY

## 2022-12-06 ENCOUNTER — Other Ambulatory Visit: Payer: Self-pay

## 2022-12-06 ENCOUNTER — Encounter (HOSPITAL_COMMUNITY): Payer: Self-pay

## 2022-12-06 ENCOUNTER — Other Ambulatory Visit (INDEPENDENT_AMBULATORY_CARE_PROVIDER_SITE_OTHER): Payer: Self-pay | Admitting: HEMATOLOGY-ONCOLOGY

## 2022-12-06 ENCOUNTER — Ambulatory Visit
Admission: RE | Admit: 2022-12-06 | Discharge: 2022-12-06 | Disposition: A | Discharge status: Home / Self Care | Payer: Medicare Other | Source: Ambulatory Visit | Attending: HEMATOLOGY-ONCOLOGY | Admitting: HEMATOLOGY-ONCOLOGY

## 2022-12-06 VITALS — BP 160/83 | HR 101 | Temp 99.4°F | Resp 20

## 2022-12-06 VITALS — BP 158/80 | HR 97 | Temp 99.2°F | Ht 64.0 in | Wt 205.8 lb

## 2022-12-06 DIAGNOSIS — Z09 Encounter for follow-up examination after completed treatment for conditions other than malignant neoplasm: Secondary | ICD-10-CM

## 2022-12-06 DIAGNOSIS — C50912 Malignant neoplasm of unspecified site of left female breast: Secondary | ICD-10-CM | POA: Insufficient documentation

## 2022-12-06 DIAGNOSIS — C773 Secondary and unspecified malignant neoplasm of axilla and upper limb lymph nodes: Secondary | ICD-10-CM | POA: Insufficient documentation

## 2022-12-06 DIAGNOSIS — Z8542 Personal history of malignant neoplasm of other parts of uterus: Secondary | ICD-10-CM | POA: Insufficient documentation

## 2022-12-06 DIAGNOSIS — Z5112 Encounter for antineoplastic immunotherapy: Secondary | ICD-10-CM | POA: Insufficient documentation

## 2022-12-06 DIAGNOSIS — Z171 Estrogen receptor negative status [ER-]: Secondary | ICD-10-CM | POA: Insufficient documentation

## 2022-12-06 DIAGNOSIS — Z08 Encounter for follow-up examination after completed treatment for malignant neoplasm: Secondary | ICD-10-CM

## 2022-12-06 DIAGNOSIS — Z902 Acquired absence of lung [part of]: Secondary | ICD-10-CM | POA: Insufficient documentation

## 2022-12-06 DIAGNOSIS — C50919 Malignant neoplasm of unspecified site of unspecified female breast: Secondary | ICD-10-CM

## 2022-12-06 DIAGNOSIS — Z85118 Personal history of other malignant neoplasm of bronchus and lung: Secondary | ICD-10-CM | POA: Insufficient documentation

## 2022-12-06 DIAGNOSIS — I82412 Acute embolism and thrombosis of left femoral vein: Secondary | ICD-10-CM

## 2022-12-06 DIAGNOSIS — Z5111 Encounter for antineoplastic chemotherapy: Secondary | ICD-10-CM | POA: Insufficient documentation

## 2022-12-06 LAB — CBC WITH DIFF
BASOPHIL #: 0 10*3/uL (ref 0.00–0.10)
BASOPHIL %: 1 % (ref 0–1)
EOSINOPHIL #: 0.2 10*3/uL (ref 0.00–0.50)
EOSINOPHIL %: 4 % (ref 1–7)
HCT: 29.6 % — ABNORMAL LOW (ref 31.2–41.9)
HGB: 10 g/dL — ABNORMAL LOW (ref 10.9–14.3)
LYMPHOCYTE #: 1 10*3/uL (ref 1.00–3.00)
LYMPHOCYTE %: 23 % (ref 16–44)
MCH: 29.1 pg (ref 24.7–32.8)
MCHC: 33.7 g/dL (ref 32.3–35.6)
MCV: 86.6 fL (ref 75.5–95.3)
MONOCYTE #: 0.4 10*3/uL (ref 0.30–1.00)
MONOCYTE %: 10 % (ref 5–13)
MPV: 9.2 fL (ref 7.9–10.8)
NEUTROPHIL #: 2.6 10*3/uL (ref 1.85–7.80)
NEUTROPHIL %: 62 % (ref 43–77)
PLATELETS: 200 10*3/uL (ref 140–440)
RBC: 3.42 10*6/uL — ABNORMAL LOW (ref 3.63–4.92)
RDW: 16.4 % (ref 12.3–17.7)
WBC: 4.3 10*3/uL (ref 3.8–11.8)

## 2022-12-06 LAB — COMPREHENSIVE METABOLIC PANEL, NON-FASTING
ALBUMIN/GLOBULIN RATIO: 1.2 (ref 0.8–1.4)
ALBUMIN: 3.4 g/dL — ABNORMAL LOW (ref 3.5–5.7)
ALKALINE PHOSPHATASE: 130 U/L — ABNORMAL HIGH (ref 34–104)
ALT (SGPT): 16 U/L (ref 7–52)
ANION GAP: 11 mmol/L (ref 4–13)
AST (SGOT): 20 U/L (ref 13–39)
BILIRUBIN TOTAL: 0.8 mg/dL (ref 0.3–1.0)
BUN/CREA RATIO: 11 (ref 6–22)
BUN: 11 mg/dL (ref 7–25)
CALCIUM, CORRECTED: 10.8 mg/dL (ref 8.9–10.8)
CALCIUM: 10.3 mg/dL (ref 8.6–10.3)
CHLORIDE: 102 mmol/L (ref 98–107)
CO2 TOTAL: 28 mmol/L (ref 21–31)
CREATININE: 0.97 mg/dL (ref 0.60–1.30)
ESTIMATED GFR: 59 mL/min/{1.73_m2} — ABNORMAL LOW (ref >59)
GLOBULIN: 2.9 (ref 2.9–5.4)
GLUCOSE: 158 mg/dL — ABNORMAL HIGH (ref 74–109)
OSMOLALITY, CALCULATED: 284 mOsm/kg (ref 270–290)
POTASSIUM: 3.7 mmol/L (ref 3.5–5.1)
PROTEIN TOTAL: 6.3 g/dL — ABNORMAL LOW (ref 6.4–8.9)
SODIUM: 141 mmol/L (ref 136–145)

## 2022-12-06 LAB — MAGNESIUM: MAGNESIUM: 1.1 mg/dL — ABNORMAL LOW (ref 1.9–2.7)

## 2022-12-06 MED ORDER — ALBUTEROL SULFATE 2.5 MG/3 ML (0.083 %) SOLUTION FOR NEBULIZATION
2.5000 mg | INHALATION_SOLUTION | Freq: Once | RESPIRATORY_TRACT | Status: DC | PRN
Start: 2022-12-06 — End: 2022-12-07

## 2022-12-06 MED ORDER — ONDANSETRON HCL 8 MG TABLET
16.0000 mg | ORAL_TABLET | Freq: Once | ORAL | Status: AC
Start: 2022-12-06 — End: 2022-12-06
  Administered 2022-12-06: 16 mg via ORAL

## 2022-12-06 MED ORDER — ALBUTEROL SULFATE HFA 90 MCG/ACTUATION AEROSOL INHALER - RN
2.0000 | Freq: Once | RESPIRATORY_TRACT | Status: DC | PRN
Start: 2022-12-06 — End: 2022-12-07

## 2022-12-06 MED ORDER — DIPHENHYDRAMINE 50 MG/ML INJECTION SOLUTION
25.0000 mg | Freq: Once | INTRAMUSCULAR | Status: AC
Start: 2022-12-06 — End: 2022-12-06
  Administered 2022-12-06: 25 mg via INTRAVENOUS

## 2022-12-06 MED ORDER — FAMOTIDINE (PF) 20 MG/2 ML INTRAVENOUS SOLUTION
20.0000 mg | Freq: Once | INTRAVENOUS | Status: DC | PRN
Start: 2022-12-06 — End: 2022-12-07

## 2022-12-06 MED ORDER — FAMOTIDINE (PF) 20 MG/2 ML INTRAVENOUS SOLUTION
INTRAVENOUS | Status: AC
Start: 2022-12-06 — End: 2022-12-06
  Filled 2022-12-06: qty 2

## 2022-12-06 MED ORDER — FAMOTIDINE (PF) 20 MG/2 ML INTRAVENOUS SOLUTION
20.0000 mg | Freq: Once | INTRAVENOUS | Status: AC
Start: 2022-12-06 — End: 2022-12-06
  Administered 2022-12-06: 20 mg via INTRAVENOUS

## 2022-12-06 MED ORDER — MAGNESIUM SULFATE 1 GRAM/100 ML IN DEXTROSE 5 % INTRAVENOUS PIGGYBACK
INJECTION | INTRAVENOUS | Status: AC
Start: 2022-12-06 — End: 2022-12-06
  Filled 2022-12-06: qty 400

## 2022-12-06 MED ORDER — TRASTUZUMAB-ANNS 150 MG INTRAVENOUS SOLUTION
2.0000 mg/kg | Freq: Once | INTRAVENOUS | Status: AC
Start: 2022-12-06 — End: 2022-12-06
  Administered 2022-12-06: 189 mg via INTRAVENOUS
  Administered 2022-12-06: 0 mg via INTRAVENOUS
  Filled 2022-12-06: qty 9

## 2022-12-06 MED ORDER — MEPERIDINE (PF) 25 MG/ML INJECTION SOLUTION
12.5000 mg | Freq: Once | INTRAMUSCULAR | Status: DC | PRN
Start: 2022-12-06 — End: 2022-12-07

## 2022-12-06 MED ORDER — HYDROCORTISONE SOD SUCCINATE 100 MG/2 ML VIAL WRAPPER
100.0000 mg | Freq: Once | INTRAMUSCULAR | Status: DC | PRN
Start: 2022-12-06 — End: 2022-12-07

## 2022-12-06 MED ORDER — ONDANSETRON HCL 8 MG TABLET
ORAL_TABLET | ORAL | Status: AC
Start: 2022-12-06 — End: 2022-12-06
  Filled 2022-12-06: qty 2

## 2022-12-06 MED ORDER — DIPHENHYDRAMINE 50 MG/ML INJECTION SOLUTION
INTRAMUSCULAR | Status: AC
Start: 2022-12-06 — End: 2022-12-06
  Filled 2022-12-06: qty 1

## 2022-12-06 MED ORDER — MAGNESIUM SULFATE 1 GRAM/100 ML IN DEXTROSE 5 % INTRAVENOUS PIGGYBACK
1.0000 g | INJECTION | INTRAVENOUS | Status: AC
Start: 2022-12-06 — End: 2022-12-06
  Administered 2022-12-06: 0 g via INTRAVENOUS
  Administered 2022-12-06 (×2): 1 g via INTRAVENOUS
  Administered 2022-12-06: 0 g via INTRAVENOUS
  Administered 2022-12-06: 1 g via INTRAVENOUS
  Administered 2022-12-06: 0 g via INTRAVENOUS
  Administered 2022-12-06: 1 g via INTRAVENOUS
  Administered 2022-12-06: 0 g via INTRAVENOUS

## 2022-12-06 MED ORDER — DEXAMETHASONE SODIUM PHOSPHATE 4 MG/ML INJECTION SOLUTION
INTRAMUSCULAR | Status: AC
Start: 2022-12-06 — End: 2022-12-06
  Filled 2022-12-06: qty 1

## 2022-12-06 MED ORDER — SODIUM CHLORIDE 0.9% FLUSH BAG - 250 ML
INTRAVENOUS | Status: DC | PRN
Start: 2022-12-06 — End: 2022-12-07

## 2022-12-06 MED ORDER — DIPHENHYDRAMINE 50 MG/ML INJECTION SOLUTION
50.0000 mg | Freq: Once | INTRAMUSCULAR | Status: DC | PRN
Start: 2022-12-06 — End: 2022-12-07

## 2022-12-06 MED ORDER — DEXAMETHASONE SODIUM PHOSPHATE 4 MG/ML INJECTION SOLUTION
4.0000 mg | Freq: Once | INTRAMUSCULAR | Status: AC
Start: 2022-12-06 — End: 2022-12-06
  Administered 2022-12-06: 4 mg via INTRAVENOUS

## 2022-12-06 MED ORDER — DEXTROSE 5% IN WATER (D5W) FLUSH BAG - 250 ML
INTRAVENOUS | Status: DC | PRN
Start: 2022-12-06 — End: 2022-12-07

## 2022-12-06 MED ORDER — EPINEPHRINE 1 MG/ML (1 ML) INJECTION SOLUTION
0.3000 mg | Freq: Once | INTRAMUSCULAR | Status: DC | PRN
Start: 2022-12-06 — End: 2022-12-07

## 2022-12-06 MED ORDER — DIPHENHYDRAMINE 50 MG/ML INJECTION SOLUTION
25.0000 mg | Freq: Once | INTRAMUSCULAR | Status: DC | PRN
Start: 2022-12-06 — End: 2022-12-07

## 2022-12-06 MED ORDER — SODIUM CHLORIDE 0.9 % INTRAVENOUS SOLUTION
150.0000 mg | Freq: Once | INTRAVENOUS | Status: AC
Start: 2022-12-06 — End: 2022-12-06
  Administered 2022-12-06: 150 mg via INTRAVENOUS
  Administered 2022-12-06: 0 mg via INTRAVENOUS
  Filled 2022-12-06 (×2): qty 25

## 2022-12-06 NOTE — Progress Notes (Unsigned)
Department of Hematology/Oncology  Progress Note   Name: Kelsey Preston  WGN:[id number]  Date of Birth: 1940/07/25  Encounter Date: 12/06/2022    REFERRING PROVIDER:  Charm Rings, MD  [address]  [place],  [place] [address]    REASON FOR OFFICE VISIT:  Breast Cancer and Lung Cancer     HISTORY OF PRESENT ILLNESS:  Kelsey Preston is a 82 y.o. female who presents today for follow up of breast cancer     The patient has a history of non-small-cell lung cancer, status post lobectomy in 2014.      She also has a history of endometrial cancer, and is status post surgical resection of that cancer as well.    More recently, the patient was found to have a left axillary lesion measuring 2.3 cm on PET-CT scan.  For some reason, I can not find an actual size measurement on the pathology report.  Multiple imaging studies of the breast were performed, but no primary lesion was found.  The malignant lesion was definitively classified as a lymph node.    Receptor staining showed that it was ER negative and HER2 Neu positive.  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    09/10/2022: The patient is here for follow up of early stage breast cancer.  She is doing well and denies any new problems at this time.    09/27/2022: The patient is here for follow up breast cancer.  She is doing well at this time and has no new complaints.    10/18/2022: the patient is here for follow up of breast cancer. She has been on treatment with paclitaxel and trastuzumab. She has been hospitalized since the last visit due to neutropenia and hypotension. She has also had electrolyte issues, with her magnesium level being somewhat low. She is taking over-the-counter magnesium oxide.    11/08/2022: The patient is here for follow up of breast cancer.  She has been on treatment with weekly paclitaxel and trastuzumab.  She did end up in the hospital relatively recently with some low cell counts, but it appears to have resolved.    12/06/2022: The patient is  here for follow up of breast cancer.  She has been doing relatively well but has noticed some peripheral neuropathy.    ROS:   Pertinent review of systems as discussed in HPI    HISTORY:  Past Medical History:   Diagnosis Date    Diabetes mellitus, type 2 (CMS HCC)     Embolism (CMS HCC)     left leg    Endometrial cancer (CMS HCC)     HTN (hypertension)     Hx of breast cancer     Lung cancer (CMS HCC)     Macular degeneration (senile) of retina          Past Surgical History:   Procedure Laterality Date    HX CHOLECYSTECTOMY      HX COLONOSCOPY      HX HYSTERECTOMY      HX LOBECTOMY      LUNG CANCER SURGERY      PORTACATH PLACEMENT           Social History     Socioeconomic History    Marital status: Married     Spouse name: Not on file    Number of children: Not on file    Years of education: Not on file    Highest education level: Not on file   Occupational History  Not on file   Tobacco Use    Smoking status: Never    Smokeless tobacco: Never   Vaping Use    Vaping status: Never Used   Substance and Sexual Activity    Alcohol use: Never    Drug use: Never    Sexual activity: Not Currently   Other Topics Concern    Not on file   Social History Narrative    Not on file     Social Determinants of Health     Financial Resource Strain: Not on file   Transportation Needs: Not on file   Social Connections: Low Risk  (10/07/2022)    Social Connections     SDOH Social Isolation: 5 or more times a week   Intimate Partner Violence: Not on file   Housing Stability: Not on file     Family Medical History:       Problem Relation (Age of Onset)    Cancer Brother            Current Outpatient Medications   Medication Sig    atorvastatin (LIPITOR) 40 mg Oral Tablet Take 1 Tablet (40 mg total) by mouth Every evening for 30 days    esomeprazole magnesium (NEXIUM) 40 mg Oral Capsule, Delayed Release(E.C.) Take 1 Capsule (40 mg total) by mouth Every morning before breakfast    ezetimibe (ZETIA) 10 mg Oral Tablet Take 1 Tablet (10  mg total) by mouth Every evening    famotidine (PEPCID) 40 mg Oral Tablet Take 1 Tablet (40 mg total) by mouth Once a day    magnesium oxide (MAG-OX) 400 mg Oral Tablet Take 1 Tablet (400 mg total) by mouth Twice daily    metFORMIN (GLUCOPHAGE XR) 500 mg Oral Tablet Sustained Release 24 hr Take 1 Tablet (500 mg total) by mouth Twice daily    multivitamin with iron Oral Tablet Take 1 Tablet by mouth Once a day    ondansetron (ZOFRAN ODT) 4 mg Oral Tablet, Rapid Dissolve Take 1 Tablet (4 mg total) by mouth Every 8 hours as needed for Nausea/Vomiting Indications: prevent nausea and vomiting from cancer chemotherapy    sertraline (ZOLOFT) 50 mg Oral Tablet Take 1 Tablet (50 mg total) by mouth Once a day    telmisartan (MICARDIS) 80 mg Oral Tablet Take 0.5 Tablets (40 mg total) by mouth Once a day    traMADoL (ULTRAM) 50 mg Oral Tablet Take 1 Tablet (50 mg total) by mouth Every 4 hours as needed for Pain    XARELTO 20 mg Oral Tablet TAKE 1 TABLET BY MOUTH EVERY EVENING WITH DINNER     Allergies   Allergen Reactions    Adhesive Rash    Ceclor [Cefaclor]  Other Adverse Reaction (Add comment)     Pt states a Ceclor pill gave her blisters in her mouth.       PHYSICAL EXAM:  Most Recent Vitals    Flowsheet Row Telemedicine from 09/02/2022 in Hematology/Oncology, [facility]   Temperature 36.3 C (97.3 F) filed at... 09/02/2022 0911   Heart Rate 77 filed at... 09/02/2022 0911   Respiratory Rate --   BP (Non-Invasive) 174/91 filed at... 09/02/2022 0911   SpO2 95 % filed at... 09/02/2022 0911   Height 1.626 m (5\' 4" ) filed at... 09/02/2022 0911   Weight 98.2 kg (216 lb 6.4 oz) filed at... 09/02/2022 0911   BMI (Calculated) 37.22 filed at... 09/02/2022 0911   BSA (Calculated) 2.11 filed at... 09/02/2022 1610  ECOG Status: (1) Restricted in physically strenuous activity, ambulatory and able to do work of light nature   Physical Exam    DIAGNOSTIC DATA:  No results found for this or any previous visit (from the past 20947  hour(s)).    LABS:   CBC  Diff   Lab Results   Component Value Date/Time    WBC 4.3 12/06/2022 08:48 AM    HGB 10.0 (L) 12/06/2022 08:48 AM    HCT 29.6 (L) 12/06/2022 08:48 AM    PLTCNT 200 12/06/2022 08:48 AM    RBC 3.42 (L) 12/06/2022 08:48 AM    MCV 86.6 12/06/2022 08:48 AM    MCHC 33.7 12/06/2022 08:48 AM    MCH 29.1 12/06/2022 08:48 AM    RDW 16.4 12/06/2022 08:48 AM    MPV 9.2 12/06/2022 08:48 AM    Lab Results   Component Value Date/Time    PMNS 62 12/06/2022 08:48 AM    LYMPHOCYTES 23 12/06/2022 08:48 AM    EOSINOPHIL 4 12/06/2022 08:48 AM    MONOCYTES 10 12/06/2022 08:48 AM    BASOPHILS 1 12/06/2022 08:48 AM    BASOPHILS 0.00 12/06/2022 08:48 AM    PMNABS 2.60 12/06/2022 08:48 AM    LYMPHSABS 1.00 12/06/2022 08:48 AM    EOSABS 0.20 12/06/2022 08:48 AM    MONOSABS 0.40 12/06/2022 08:48 AM    BASABS 0.04 05/30/2022 05:40 AM            Comprehensive Metabolic Profile    Lab Results   Component Value Date    SODIUM 141 11/27/2022    POTASSIUM 4.0 11/27/2022    CHLORIDE 106 11/27/2022    CO2 29 11/27/2022    ANIONGAP 6 11/27/2022    BUN 8 11/27/2022    CREATININE 0.86 11/27/2022    ALBUMIN 3.6 11/27/2022    CALCIUM 10.0 11/27/2022    GLUCOSENF 153 (H) 11/27/2022    ALKPHOS 135 (H) 11/27/2022    ALT 13 11/27/2022    AST 18 11/27/2022    TOTBILIRUBIN 1.1 (H) 11/27/2022    TOTALPROTEIN 6.6 11/27/2022          BASIC METABOLIC PANEL  Lab Results   Component Value Date    SODIUM 141 11/27/2022    POTASSIUM 4.0 11/27/2022    CHLORIDE 106 11/27/2022    CO2 29 11/27/2022    ANIONGAP 6 11/27/2022    BUN 8 11/27/2022    CREATININE 0.86 11/27/2022    BUNCRRATIO 9 11/27/2022    GFR 68 11/27/2022    CALCIUM 10.0 11/27/2022    GLUCOSE Negative 10/07/2022    GLUCOSENF 153 (H) 11/27/2022           ASSESSMENT:  Problem List Items Addressed This Visit          Oncology    Breast cancer (CMS HCC) - Primary        ICD-10-CM    1. Breast cancer (CMS HCC)  C50.919            PLAN:   1. All relevant medical records were reviewed  including available pertinent provider notes, procedure notes, imaging, laboratory, and pathology.   2. All pertinent labs and/or imaging were reviewed with the patient.   3. Breast cancer:  Status post resection of an involved lymph node that was 2.3 cm on PET/CT.  No primary lesion was seen on imaging studies.  T0 N1 M0, stage IIA.  ER negative, HER2 Neu positive.  After resection of the node, she  was rendered cancer-free radiographically.  She is on treatment with adjuvant paclitaxel and trastuzumab.  We had previously discussed the options of either no additional surgery and taking our chances versus radiation of the breast versus mastectomy.  Radiation to the axillary area will likely be required due to the lymph node that was known to be involved.  Once her chemo is complete, we will plan on getting a restaging scan and referring her to Radiation Oncology for additional discussion.  4. History of lung cancer:  Continue surveillance.    5. History of gynecologic cancer: Continue surveillance as well.    Kelsey Preston was given the chance to ask questions, and these were answered to their satisfaction. The patient is welcome to call with any questions or concerns in the meantime.     On the day of the encounter, a total of 35 minutes was spent on this patient encounter including review of historical information, examination, documentation and post-visit activities.   Return in about 3 weeks (around 12/27/2022).     Lupita Dawn, MD  12/06/2022 , 09:24  The patient's insurance company bears full legal and financial responsibility resulting from any deviations that they cause to my recommended treatment plan.   CC:  Charm Rings, MD  [address]  [place] [address]    Charm Rings, MD  [address]  [place],  [place] [address]    This note was partially generated using MModal Fluency Direct system, and there may be some incorrect words, spellings, and punctuation that were not noted in checking the note  before saving.

## 2022-12-06 NOTE — Nurses Notes (Signed)
0931-Patient to room by wheelchair from doctor's office.  Patient reports feeling well overall, but experiencing nausea and fatigue following treatment.  Patient is oriented x4, lung sounds are clear to diminished, and strong palpable pulses present.  Vitals are stable.Salli Quarry, RN       Patient Assessment/Symptom Management Patient Has MD Appointment Today   Key: (+) Symptom present           (-)  Symptom not present If Symptom is Positive(+) a Nursing Note is required   Edema -   Controlled Nausea +   Vomiting -   Inability to eat/drink -   Mouth Sores -   Diarrhea -   Constipation (? Last BM) -   Fatigue that interferes with ADL's +   Numbness/Tingling -change -   Other -   Fever/Signs & Symptoms of infection -   Nurse Initials KB         0945-PAC accessed and flushed per protocol.  Blood return obtained.Salli Quarry, RN  [phone], Benadryl, Pepcid, and Zofran given.Salli Quarry, RN  1021-Magnesium infusion started (4 grams total to infuse).Salli Quarry, RN  1024-KANJINTI infusion started.Salli Quarry, RN  1054-KANJINTI infusion complete.Salli Quarry, RN  1108-TAXOL infusion started.Salli Quarry, RN  1208-TAXOL infusion complete.Salli Quarry, RN  1225-Magnesium infusion complete.Salli Quarry, RN  1250-PAC flushed and deaccessed.  Blood return obtained prior to Janeece Riggers, RN  1300-Patient checking out at this time.  Vitals are stable.Salli Quarry, RN

## 2022-12-12 ENCOUNTER — Encounter (INDEPENDENT_AMBULATORY_CARE_PROVIDER_SITE_OTHER): Payer: Self-pay | Admitting: HEMATOLOGY-ONCOLOGY

## 2022-12-13 ENCOUNTER — Ambulatory Visit
Admission: RE | Admit: 2022-12-13 | Discharge: 2022-12-13 | Disposition: A | Discharge status: Home / Self Care | Payer: Medicare Other | Source: Ambulatory Visit | Attending: HEMATOLOGY-ONCOLOGY | Admitting: HEMATOLOGY-ONCOLOGY

## 2022-12-13 ENCOUNTER — Encounter (HOSPITAL_COMMUNITY): Payer: Self-pay

## 2022-12-13 ENCOUNTER — Other Ambulatory Visit: Payer: Self-pay

## 2022-12-13 VITALS — BP 148/80 | HR 92 | Temp 98.4°F | Resp 18 | Wt 204.1 lb

## 2022-12-13 DIAGNOSIS — C50919 Malignant neoplasm of unspecified site of unspecified female breast: Secondary | ICD-10-CM | POA: Insufficient documentation

## 2022-12-13 DIAGNOSIS — Z5112 Encounter for antineoplastic immunotherapy: Secondary | ICD-10-CM | POA: Insufficient documentation

## 2022-12-13 DIAGNOSIS — I82412 Acute embolism and thrombosis of left femoral vein: Secondary | ICD-10-CM | POA: Insufficient documentation

## 2022-12-13 DIAGNOSIS — Z5111 Encounter for antineoplastic chemotherapy: Secondary | ICD-10-CM | POA: Insufficient documentation

## 2022-12-13 LAB — CBC WITH DIFF
BASOPHIL #: 0 10*3/uL (ref 0.00–0.10)
BASOPHIL %: 1 % (ref 0–1)
EOSINOPHIL #: 0.1 10*3/uL (ref 0.00–0.50)
EOSINOPHIL %: 4 % (ref 1–7)
HCT: 28.4 % — ABNORMAL LOW (ref 31.2–41.9)
HGB: 9.6 g/dL — ABNORMAL LOW (ref 10.9–14.3)
LYMPHOCYTE #: 0.7 10*3/uL — ABNORMAL LOW (ref 1.00–3.00)
LYMPHOCYTE %: 18 % (ref 16–44)
MCH: 28.8 pg (ref 24.7–32.8)
MCHC: 33.6 g/dL (ref 32.3–35.6)
MCV: 85.9 fL (ref 75.5–95.3)
MONOCYTE #: 0.4 10*3/uL (ref 0.30–1.00)
MONOCYTE %: 10 % (ref 5–13)
MPV: 9.1 fL (ref 7.9–10.8)
NEUTROPHIL #: 2.8 10*3/uL (ref 1.85–7.80)
NEUTROPHIL %: 68 % (ref 43–77)
PLATELETS: 204 10*3/uL (ref 140–440)
RBC: 3.31 10*6/uL — ABNORMAL LOW (ref 3.63–4.92)
RDW: 16.4 % (ref 12.3–17.7)
WBC: 4.1 10*3/uL (ref 3.8–11.8)

## 2022-12-13 LAB — COMPREHENSIVE METABOLIC PANEL, NON-FASTING
ALBUMIN/GLOBULIN RATIO: 1.3 (ref 0.8–1.4)
ALBUMIN: 3.6 g/dL (ref 3.5–5.7)
ALKALINE PHOSPHATASE: 145 U/L — ABNORMAL HIGH (ref 34–104)
ALT (SGPT): 38 U/L (ref 7–52)
ANION GAP: 8 mmol/L (ref 4–13)
AST (SGOT): 40 U/L — ABNORMAL HIGH (ref 13–39)
BILIRUBIN TOTAL: 1.1 mg/dL — ABNORMAL HIGH (ref 0.3–1.0)
BUN/CREA RATIO: 11 (ref 6–22)
BUN: 11 mg/dL (ref 7–25)
CALCIUM, CORRECTED: 10.6 mg/dL (ref 8.9–10.8)
CALCIUM: 10.3 mg/dL (ref 8.6–10.3)
CHLORIDE: 103 mmol/L (ref 98–107)
CO2 TOTAL: 27 mmol/L (ref 21–31)
CREATININE: 1 mg/dL (ref 0.60–1.30)
ESTIMATED GFR: 57 mL/min/{1.73_m2} — ABNORMAL LOW (ref >59)
GLOBULIN: 2.8 — ABNORMAL LOW (ref 2.9–5.4)
GLUCOSE: 190 mg/dL — ABNORMAL HIGH (ref 74–109)
OSMOLALITY, CALCULATED: 280 mosm/kg (ref 270–290)
POTASSIUM: 3.6 mmol/L (ref 3.5–5.1)
PROTEIN TOTAL: 6.4 g/dL (ref 6.4–8.9)
SODIUM: 138 mmol/L (ref 136–145)

## 2022-12-13 LAB — MAGNESIUM: MAGNESIUM: 1.2 mg/dL — ABNORMAL LOW (ref 1.9–2.7)

## 2022-12-13 MED ORDER — FAMOTIDINE (PF) 20 MG/2 ML INTRAVENOUS SOLUTION
20.0000 mg | Freq: Once | INTRAVENOUS | Status: AC
Start: 2022-12-13 — End: 2022-12-13
  Administered 2022-12-13: 20 mg via INTRAVENOUS

## 2022-12-13 MED ORDER — ALBUTEROL SULFATE HFA 90 MCG/ACTUATION AEROSOL INHALER - RN
2.0000 | Freq: Once | RESPIRATORY_TRACT | Status: DC | PRN
Start: 2022-12-13 — End: 2022-12-14

## 2022-12-13 MED ORDER — DEXAMETHASONE SODIUM PHOSPHATE 4 MG/ML INJECTION SOLUTION
INTRAMUSCULAR | Status: AC
Start: 2022-12-13 — End: 2022-12-13
  Filled 2022-12-13: qty 1

## 2022-12-13 MED ORDER — MAGNESIUM SULFATE 1 GRAM/100 ML IN DEXTROSE 5 % INTRAVENOUS PIGGYBACK
1.0000 g | INJECTION | INTRAVENOUS | Status: AC
Start: 2022-12-13 — End: 2022-12-13
  Administered 2022-12-13 (×2): 1 g via INTRAVENOUS
  Administered 2022-12-13 (×2): 0 g via INTRAVENOUS
  Administered 2022-12-13: 1 g via INTRAVENOUS
  Administered 2022-12-13 (×2): 0 g via INTRAVENOUS
  Administered 2022-12-13: 1 g via INTRAVENOUS

## 2022-12-13 MED ORDER — DIPHENHYDRAMINE 50 MG/ML INJECTION SOLUTION
25.0000 mg | Freq: Once | INTRAMUSCULAR | Status: AC
Start: 2022-12-13 — End: 2022-12-13
  Administered 2022-12-13: 25 mg via INTRAVENOUS

## 2022-12-13 MED ORDER — FAMOTIDINE (PF) 20 MG/2 ML INTRAVENOUS SOLUTION
20.0000 mg | Freq: Once | INTRAVENOUS | Status: DC | PRN
Start: 2022-12-13 — End: 2022-12-14

## 2022-12-13 MED ORDER — MAGNESIUM SULFATE 1 GRAM/100 ML IN DEXTROSE 5 % INTRAVENOUS PIGGYBACK
INJECTION | INTRAVENOUS | Status: AC
Start: 2022-12-13 — End: 2022-12-13
  Filled 2022-12-13: qty 200

## 2022-12-13 MED ORDER — ALBUTEROL SULFATE 2.5 MG/3 ML (0.083 %) SOLUTION FOR NEBULIZATION
2.5000 mg | INHALATION_SOLUTION | Freq: Once | RESPIRATORY_TRACT | Status: DC | PRN
Start: 2022-12-13 — End: 2022-12-14

## 2022-12-13 MED ORDER — FAMOTIDINE (PF) 20 MG/2 ML INTRAVENOUS SOLUTION
INTRAVENOUS | Status: AC
Start: 2022-12-13 — End: 2022-12-13
  Filled 2022-12-13: qty 2

## 2022-12-13 MED ORDER — SODIUM CHLORIDE 0.9% FLUSH BAG - 250 ML
INTRAVENOUS | Status: DC | PRN
Start: 2022-12-13 — End: 2022-12-14

## 2022-12-13 MED ORDER — DEXAMETHASONE SODIUM PHOSPHATE 4 MG/ML INJECTION SOLUTION
4.0000 mg | Freq: Once | INTRAMUSCULAR | Status: AC
Start: 2022-12-13 — End: 2022-12-13
  Administered 2022-12-13: 4 mg via INTRAVENOUS

## 2022-12-13 MED ORDER — EPINEPHRINE 1 MG/ML (1 ML) INJECTION SOLUTION
0.3000 mg | Freq: Once | INTRAMUSCULAR | Status: DC | PRN
Start: 2022-12-13 — End: 2022-12-14

## 2022-12-13 MED ORDER — DEXTROSE 5% IN WATER (D5W) FLUSH BAG - 250 ML
INTRAVENOUS | Status: DC | PRN
Start: 2022-12-13 — End: 2022-12-14

## 2022-12-13 MED ORDER — TRASTUZUMAB-ANNS 150 MG INTRAVENOUS SOLUTION
2.0000 mg/kg | Freq: Once | INTRAVENOUS | Status: AC
Start: 2022-12-13 — End: 2022-12-13
  Administered 2022-12-13: 0 mg via INTRAVENOUS
  Administered 2022-12-13: 189 mg via INTRAVENOUS
  Filled 2022-12-13: qty 9

## 2022-12-13 MED ORDER — MEPERIDINE (PF) 25 MG/ML INJECTION SOLUTION
12.5000 mg | Freq: Once | INTRAMUSCULAR | Status: DC | PRN
Start: 2022-12-13 — End: 2022-12-14

## 2022-12-13 MED ORDER — HYDROCORTISONE SOD SUCCINATE 100 MG/2 ML VIAL WRAPPER
100.0000 mg | Freq: Once | INTRAMUSCULAR | Status: DC | PRN
Start: 2022-12-13 — End: 2022-12-14

## 2022-12-13 MED ORDER — ONDANSETRON HCL 8 MG TABLET
ORAL_TABLET | ORAL | Status: AC
Start: 2022-12-13 — End: 2022-12-13
  Filled 2022-12-13: qty 2

## 2022-12-13 MED ORDER — DIPHENHYDRAMINE 50 MG/ML INJECTION SOLUTION
50.0000 mg | Freq: Once | INTRAMUSCULAR | Status: DC | PRN
Start: 2022-12-13 — End: 2022-12-14

## 2022-12-13 MED ORDER — DIPHENHYDRAMINE 50 MG/ML INJECTION SOLUTION
25.0000 mg | Freq: Once | INTRAMUSCULAR | Status: DC | PRN
Start: 2022-12-13 — End: 2022-12-14

## 2022-12-13 MED ORDER — SODIUM CHLORIDE 0.9 % INTRAVENOUS SOLUTION
150.0000 mg | Freq: Once | INTRAVENOUS | Status: AC
Start: 2022-12-13 — End: 2022-12-13
  Administered 2022-12-13: 150 mg via INTRAVENOUS
  Administered 2022-12-13: 0 mg via INTRAVENOUS
  Filled 2022-12-13: qty 25

## 2022-12-13 MED ORDER — DIPHENHYDRAMINE 50 MG/ML INJECTION SOLUTION
INTRAMUSCULAR | Status: AC
Start: 2022-12-13 — End: 2022-12-13
  Filled 2022-12-13: qty 1

## 2022-12-13 MED ORDER — ONDANSETRON HCL 8 MG TABLET
16.0000 mg | ORAL_TABLET | Freq: Once | ORAL | Status: AC
Start: 2022-12-13 — End: 2022-12-13
  Administered 2022-12-13: 16 mg via ORAL

## 2022-12-13 NOTE — Nurses Notes (Signed)
0930 - Patient to room via wheelchair with family for treatment today. Assessment complete. Lungs clear throughout lung fields. Abdomen soft and non-tender, bowel sounds present. No edema noted. Patient denies any pain or discomfort at this time.  Carolynn Serve, RN  Patient Assessment/Symptom Management Patient Has No MD Appointment Today   Key: (+) Symptom present           (-)  Symptom not present If Symptom is Positive(+) a Nursing Note is required   Edema -   Uncontrolled Nausea -   Vomiting -   Inability to eat/drink -   Mouth Sores -   Diarrhea -   Constipation (? Last BM) -   Fatigue that interferes with ADL's -   Numbness/Tingling -change -   Other -   Fever/Signs & Symptoms of infection -   Nurse Initials JD   [phone] - PAC accessed, excellent blood return noted. Carolynn Serve, RN  [phone] - Labs reviewed and are good for treatment. Orders released to pharmacy. Carolynn Serve, RN  [phone] - Premedications Zofran, Decadron, Benadryl, and Pepcid given. Carolynn Serve, RN  1128 - 4 grams of Magnesium ordered for Magnesium of 1.2. 1st Magnesium rider started. Carolynn Serve, RN  [phone] - 1st Magnesium rider complete. 2nd Magnesium rider started. Carolynn Serve, RN  [phone] - Kanjinti infusion started. Carolynn Serve, RN  [phone] - 2nd Magnesium rider complete. 3rd Magnesium rider started.Carolynn Serve, RN  [phone] - Kanjinti infusion complete. Line flushing. Carolynn Serve, RN  [phone] - Taxol infusion started. Carolynn Serve, RN  [phone] - 3rd Magnesium rider complete. 4th Magnesium rider started. Carolynn Serve, RN  [phone] - 4th Magnesium rider complete. Line flushing. Carolynn Serve, RN   [phone] - Taxol infusion complete. Line flushing. Carolynn Serve, RN  1410 - PAC flushed with NS and deaccessed. Gauze dressing and adhesive bandage applied. Carolynn Serve, RN  [phone] - Patient left via wheelchair with family at this time. Carolynn Serve, RN

## 2022-12-20 ENCOUNTER — Encounter (HOSPITAL_COMMUNITY): Payer: Self-pay

## 2022-12-20 ENCOUNTER — Other Ambulatory Visit (INDEPENDENT_AMBULATORY_CARE_PROVIDER_SITE_OTHER): Payer: Self-pay | Admitting: HEMATOLOGY-ONCOLOGY

## 2022-12-20 ENCOUNTER — Ambulatory Visit
Admission: RE | Admit: 2022-12-20 | Discharge: 2022-12-20 | Disposition: A | Discharge status: Home / Self Care | Payer: Medicare Other | Source: Ambulatory Visit | Attending: HEMATOLOGY-ONCOLOGY | Admitting: HEMATOLOGY-ONCOLOGY

## 2022-12-20 ENCOUNTER — Encounter (INDEPENDENT_AMBULATORY_CARE_PROVIDER_SITE_OTHER): Payer: Self-pay | Admitting: HEMATOLOGY-ONCOLOGY

## 2022-12-20 ENCOUNTER — Other Ambulatory Visit: Payer: Self-pay

## 2022-12-20 VITALS — BP 166/86 | HR 84 | Temp 98.3°F | Resp 18 | Ht 64.0 in | Wt 200.2 lb

## 2022-12-20 DIAGNOSIS — Z5111 Encounter for antineoplastic chemotherapy: Secondary | ICD-10-CM | POA: Insufficient documentation

## 2022-12-20 DIAGNOSIS — Z5112 Encounter for antineoplastic immunotherapy: Secondary | ICD-10-CM | POA: Insufficient documentation

## 2022-12-20 DIAGNOSIS — I82412 Acute embolism and thrombosis of left femoral vein: Secondary | ICD-10-CM | POA: Insufficient documentation

## 2022-12-20 DIAGNOSIS — C50919 Malignant neoplasm of unspecified site of unspecified female breast: Secondary | ICD-10-CM | POA: Insufficient documentation

## 2022-12-20 DIAGNOSIS — Z23 Encounter for immunization: Secondary | ICD-10-CM | POA: Insufficient documentation

## 2022-12-20 LAB — CBC WITH DIFF
BASOPHIL #: 0 10*3/uL (ref 0.00–0.10)
BASOPHIL %: 1 % (ref 0–1)
EOSINOPHIL #: 0.1 10*3/uL (ref 0.00–0.50)
EOSINOPHIL %: 3 % (ref 1–7)
HCT: 26.9 % — ABNORMAL LOW (ref 31.2–41.9)
HGB: 9 g/dL — ABNORMAL LOW (ref 10.9–14.3)
LYMPHOCYTE #: 0.8 10*3/uL — ABNORMAL LOW (ref 1.00–3.00)
LYMPHOCYTE %: 21 % (ref 16–44)
MCH: 28.7 pg (ref 24.7–32.8)
MCHC: 33.4 g/dL (ref 32.3–35.6)
MCV: 85.7 fL (ref 75.5–95.3)
MONOCYTE #: 0.4 10*3/uL (ref 0.30–1.00)
MONOCYTE %: 10 % (ref 5–13)
MPV: 9.3 fL (ref 7.9–10.8)
NEUTROPHIL #: 2.5 10*3/uL (ref 1.85–7.80)
NEUTROPHIL %: 64 % (ref 43–77)
PLATELETS: 222 10*3/uL (ref 140–440)
RBC: 3.14 10*6/uL — ABNORMAL LOW (ref 3.63–4.92)
RDW: 16.6 % (ref 12.3–17.7)
WBC: 3.9 10*3/uL (ref 3.8–11.8)

## 2022-12-20 LAB — MAGNESIUM: MAGNESIUM: 1.2 mg/dL — ABNORMAL LOW (ref 1.9–2.7)

## 2022-12-20 MED ORDER — EPINEPHRINE 1 MG/ML (1 ML) INJECTION SOLUTION
0.3000 mg | Freq: Once | INTRAMUSCULAR | Status: DC | PRN
Start: 2022-12-20 — End: 2022-12-21

## 2022-12-20 MED ORDER — SODIUM CHLORIDE 0.9% FLUSH BAG - 250 ML
INTRAVENOUS | Status: DC | PRN
Start: 2022-12-20 — End: 2022-12-21

## 2022-12-20 MED ORDER — MAGNESIUM SULFATE 1 GRAM/100 ML IN DEXTROSE 5 % INTRAVENOUS PIGGYBACK
INJECTION | INTRAVENOUS | Status: AC
Start: 2022-12-20 — End: 2022-12-20
  Filled 2022-12-20: qty 400

## 2022-12-20 MED ORDER — FAMOTIDINE (PF) 20 MG/2 ML INTRAVENOUS SOLUTION
20.0000 mg | Freq: Once | INTRAVENOUS | Status: DC | PRN
Start: 2022-12-20 — End: 2022-12-21

## 2022-12-20 MED ORDER — FAMOTIDINE (PF) 20 MG/2 ML INTRAVENOUS SOLUTION
INTRAVENOUS | Status: AC
Start: 2022-12-20 — End: 2022-12-20
  Filled 2022-12-20: qty 2

## 2022-12-20 MED ORDER — DIPHENHYDRAMINE 50 MG/ML INJECTION SOLUTION
25.0000 mg | Freq: Once | INTRAMUSCULAR | Status: DC | PRN
Start: 2022-12-20 — End: 2022-12-21

## 2022-12-20 MED ORDER — FLU VACC 2024-25(65YR UP)-MF59C(PF) 45 MCG(15 MCGX3)/0.5 ML IM SYRINGE
0.5000 mL | INJECTION | Freq: Once | INTRAMUSCULAR | Status: AC
Start: 2022-12-20 — End: 2022-12-20
  Administered 2022-12-20: 0.5 mL via INTRAMUSCULAR
  Filled 2022-12-20: qty 0.5

## 2022-12-20 MED ORDER — MAGNESIUM SULFATE 1 GRAM/100 ML IN DEXTROSE 5 % INTRAVENOUS PIGGYBACK
1.0000 g | INJECTION | INTRAVENOUS | Status: AC
Start: 2022-12-20 — End: 2022-12-20
  Administered 2022-12-20: 0 g via INTRAVENOUS
  Administered 2022-12-20 (×2): 1 g via INTRAVENOUS
  Administered 2022-12-20: 0 g via INTRAVENOUS
  Administered 2022-12-20: 1 g via INTRAVENOUS
  Administered 2022-12-20 (×2): 0 g via INTRAVENOUS
  Administered 2022-12-20: 1 g via INTRAVENOUS

## 2022-12-20 MED ORDER — SODIUM CHLORIDE 0.9 % INTRAVENOUS SOLUTION
150.0000 mg | Freq: Once | INTRAVENOUS | Status: AC
Start: 2022-12-20 — End: 2022-12-20
  Administered 2022-12-20: 150 mg via INTRAVENOUS
  Administered 2022-12-20: 0 mg via INTRAVENOUS
  Filled 2022-12-20: qty 25

## 2022-12-20 MED ORDER — ONDANSETRON HCL 8 MG TABLET
ORAL_TABLET | ORAL | Status: AC
Start: 2022-12-20 — End: 2022-12-20
  Filled 2022-12-20: qty 2

## 2022-12-20 MED ORDER — MEPERIDINE (PF) 25 MG/ML INJECTION SOLUTION
12.5000 mg | Freq: Once | INTRAMUSCULAR | Status: DC | PRN
Start: 2022-12-20 — End: 2022-12-21

## 2022-12-20 MED ORDER — FAMOTIDINE (PF) 20 MG/2 ML INTRAVENOUS SOLUTION
20.0000 mg | Freq: Once | INTRAVENOUS | Status: AC
Start: 2022-12-20 — End: 2022-12-20
  Administered 2022-12-20: 20 mg via INTRAVENOUS

## 2022-12-20 MED ORDER — DEXAMETHASONE SODIUM PHOSPHATE 4 MG/ML INJECTION SOLUTION
INTRAMUSCULAR | Status: AC
Start: 2022-12-20 — End: 2022-12-20
  Filled 2022-12-20: qty 1

## 2022-12-20 MED ORDER — HYDROCORTISONE SOD SUCCINATE 100 MG/2 ML VIAL WRAPPER
100.0000 mg | Freq: Once | INTRAMUSCULAR | Status: DC | PRN
Start: 2022-12-20 — End: 2022-12-21

## 2022-12-20 MED ORDER — DEXTROSE 5% IN WATER (D5W) FLUSH BAG - 250 ML
INTRAVENOUS | Status: DC | PRN
Start: 2022-12-20 — End: 2022-12-21

## 2022-12-20 MED ORDER — ALBUTEROL SULFATE HFA 90 MCG/ACTUATION AEROSOL INHALER - RN
2.0000 | Freq: Once | RESPIRATORY_TRACT | Status: DC | PRN
Start: 2022-12-20 — End: 2022-12-21

## 2022-12-20 MED ORDER — ALBUTEROL SULFATE 2.5 MG/3 ML (0.083 %) SOLUTION FOR NEBULIZATION
2.5000 mg | INHALATION_SOLUTION | Freq: Once | RESPIRATORY_TRACT | Status: DC | PRN
Start: 2022-12-20 — End: 2022-12-21

## 2022-12-20 MED ORDER — DIPHENHYDRAMINE 50 MG/ML INJECTION SOLUTION
50.0000 mg | Freq: Once | INTRAMUSCULAR | Status: DC | PRN
Start: 2022-12-20 — End: 2022-12-21

## 2022-12-20 MED ORDER — DEXAMETHASONE SODIUM PHOSPHATE 4 MG/ML INJECTION SOLUTION
4.0000 mg | Freq: Once | INTRAMUSCULAR | Status: AC
Start: 2022-12-20 — End: 2022-12-20
  Administered 2022-12-20: 4 mg via INTRAVENOUS

## 2022-12-20 MED ORDER — TRASTUZUMAB-ANNS 150 MG INTRAVENOUS SOLUTION
2.0000 mg/kg | Freq: Once | INTRAVENOUS | Status: AC
Start: 2022-12-20 — End: 2022-12-20
  Administered 2022-12-20: 0 mg via INTRAVENOUS
  Administered 2022-12-20: 189 mg via INTRAVENOUS
  Filled 2022-12-20: qty 9

## 2022-12-20 MED ORDER — DIPHENHYDRAMINE 50 MG/ML INJECTION SOLUTION
25.0000 mg | Freq: Once | INTRAMUSCULAR | Status: AC
Start: 2022-12-20 — End: 2022-12-20
  Administered 2022-12-20: 25 mg via INTRAVENOUS

## 2022-12-20 MED ORDER — ONDANSETRON HCL 8 MG TABLET
16.0000 mg | ORAL_TABLET | Freq: Once | ORAL | Status: AC
Start: 2022-12-20 — End: 2022-12-20
  Administered 2022-12-20: 16 mg via ORAL

## 2022-12-20 MED ORDER — DIPHENHYDRAMINE 50 MG/ML INJECTION SOLUTION
INTRAMUSCULAR | Status: AC
Start: 2022-12-20 — End: 2022-12-20
  Filled 2022-12-20: qty 1

## 2022-12-20 NOTE — Nursing Note (Signed)
Called in a verbal prescription for Compazine 10 mg tablet p.o. q.6 hours PRN for nausea; 30 tablets with 3 refills per Dr. [name] to [facility].

## 2022-12-20 NOTE — Nurses Notes (Addendum)
1040-Arrived to [facility] infusion center in wheelchair.  Accompanied by spouse.  Here for Kanjinti and Taxol infusions.  To room 223 at this time. Junie Panning, RN  1101-Seated in vascular chair with wheels locked.  Awake, alert and oriented x 4.  Pleasant affect.  Slightly hard of hearing. Spouse, daughter and granddaughter in room.  Continues to verbalize c/o fatigue and nausea without vomiting.  Has had loose stools x 2 daily.  Appetite poor.  Lost approximately 4lbs since last treatment.    HRR.  Lungs clear.  Abdomen soft and non-tender with active bowel sounds noted in all four quadrants.  No acute distress noted.  Request flu shot. VSS. Junie Panning, RN  1115-Right upper chest PortACath accessed per protocol.  Excellent blood return noted.  Flushed without difficulty.  10ml blood wasted.  Then labs obtained from port.  Site dressed with transparent, non-bordered, dressing.  Tolerated well.  Labs sent to main hospital lab through tube system. Junie Panning, RN  1123-Secure message sent to Dr. [name] and Marvene Staff, RN concerning nausea.  Requested the addition of Compazine to pt's home medications.  Discussed alternating Zofran and Compazine to see if this may help relieve nausea. Junie Panning, RN  1139-Ok to receive flu shot per Dr. [name] via secure chat.  Junie Panning, RN  1140-Magnesium 1.2.  Pt to receive Magnesium Sulfate 4 grams IV per protocol. Junie Panning, RN  1203-Zofran 16mg  PO and Dexamethasone 4mg  IVP administered per premedication order. Junie Panning, RNAmanda Detroit, RN  1208-Pepcid 20 mg IVP administered per premedication order. Junie Panning, RN  1212-Benadryl 25 mg IVP administered per premedication order. Junie Panning, RN  1242-Resting quietly.  No voiced complaints offered. Kanjinti 189mg  IV infusion started at this time. Junie Panning, RN  1312-Kanjinti infusion complete.  Tolerated well. Junie Panning, RN  1321-Taxol 150mg  IV infusion and Magnesium 4 grams IV infusion started, concurrently at this time.  Junie Panning, RN  Junie Panning, RN  1421-Taxol infusion completed. Tolerated well. Junie Panning, RN  1500-Influenza virus vaccine (FLUAD for ages 65+) administered IM in left deltoid.  Adhesive bandage applied.  Tolerated well. Junie Panning, RN  1528-Magnesium infusion complete.  Tolerated well. Junie Panning, RN  1551-Left OP ONC infusion center in wheelchair.  Accompanied by spouse and this nurse.  VSS with no acute distress noted. Transported to personal vehicle.  Family to transport home.   No s/s of distress noted. Junie Panning, RN

## 2022-12-25 ENCOUNTER — Inpatient Hospital Stay
Admission: RE | Admit: 2022-12-25 | Discharge: 2022-12-25 | Disposition: A | Discharge status: Home / Self Care | Payer: Medicare Other | Source: Ambulatory Visit | Attending: HEMATOLOGY-ONCOLOGY | Admitting: HEMATOLOGY-ONCOLOGY

## 2022-12-25 ENCOUNTER — Encounter (INDEPENDENT_AMBULATORY_CARE_PROVIDER_SITE_OTHER): Payer: Self-pay | Admitting: HEMATOLOGY-ONCOLOGY

## 2022-12-25 ENCOUNTER — Other Ambulatory Visit: Payer: Self-pay

## 2022-12-25 DIAGNOSIS — I071 Rheumatic tricuspid insufficiency: Secondary | ICD-10-CM

## 2022-12-25 DIAGNOSIS — Z08 Encounter for follow-up examination after completed treatment for malignant neoplasm: Secondary | ICD-10-CM | POA: Insufficient documentation

## 2022-12-25 DIAGNOSIS — Z09 Encounter for follow-up examination after completed treatment for conditions other than malignant neoplasm: Secondary | ICD-10-CM | POA: Insufficient documentation

## 2022-12-26 ENCOUNTER — Encounter (INDEPENDENT_AMBULATORY_CARE_PROVIDER_SITE_OTHER): Payer: Self-pay | Admitting: HEMATOLOGY-ONCOLOGY

## 2022-12-26 NOTE — Cancer Center Note (Signed)
Department of Hematology/Oncology  Progress Note   Name: Kelsey Preston  ONG:[id number]  Date of Birth: [birth year]  Encounter Date: 12/27/2022    REFERRING PROVIDER:  Charm Rings, MD  [address]  [place],  [place] [address]    REASON FOR OFFICE VISIT:  Breast Cancer and Lung Cancer     HISTORY OF PRESENT ILLNESS:  Kelsey Preston is a 82 y.o. female who presents today for follow up of breast cancer     The patient has a history of non-small-cell lung cancer, status post lobectomy in 2014.      She also has a history of endometrial cancer, and is status post surgical resection of that cancer as well.    More recently, the patient was found to have a left axillary lesion measuring 2.3 cm on PET-CT scan.  For some reason, I can not find an actual size measurement on the pathology report.  Multiple imaging studies of the breast were performed, but no primary lesion was found.  The malignant lesion was definitively classified as a lymph node.    Receptor staining showed that it was ER negative and HER2 Neu positive.  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    09/10/2022: The patient is here for follow up of early stage breast cancer.  She is doing well and denies any new problems at this time.    09/27/2022: The patient is here for follow up breast cancer.  She is doing well at this time and has no new complaints.    10/18/2022: the patient is here for follow up of breast cancer. She has been on treatment with paclitaxel and trastuzumab. She has been hospitalized since the last visit due to neutropenia and hypotension. She has also had electrolyte issues, with her magnesium level being somewhat low. She is taking over-the-counter magnesium oxide.    11/08/2022: The patient is here for follow up of breast cancer.  She has been on treatment with weekly paclitaxel and trastuzumab.  She did end up in the hospital relatively recently with some low cell counts, but it appears to have resolved.    12/06/2022: The patient is  here for follow up of breast cancer.  She has been doing relatively well but has noticed some peripheral neuropathy.    12/27/2022: The patient is here today for follow up of breast cancer. She had her last Echo completed on 12/25/2022. Her Echo completed on 05/31/22 showed an Ejection Fraction of 60-65%. Her follow up Echo on 09/24/22 showed EF of 54% and Echo on 09/24/22 showed EF of 50-55%. Dr. [name] was notified of these results and he recommended holding Kinjinti today and repeating Echo prior to her next dose of Kinjinti. She has require magnesium replacement and the family is concerned since she will be only coming every 3 weeks for treatment. We will work on having her scheduled to return for weekly Magnesium lab and replacement PRN. She will have a follow up PET ordered and scheduled for restaging since she has completed chemotherapy part of her treat. We will also refer her to RT for evaluation, per Dr. Virgilio Frees treatment plan.     ROS:   Pertinent review of systems as discussed in HPI    HISTORY:  Past Medical History:   Diagnosis Date    Diabetes mellitus, type 2 (CMS HCC)     Embolism (CMS HCC)     left leg    Endometrial cancer (CMS HCC)     HTN (  hypertension)     Hx of breast cancer     Lung cancer (CMS HCC)     Macular degeneration (senile) of retina          Past Surgical History:   Procedure Laterality Date    HX CHOLECYSTECTOMY      HX COLONOSCOPY      HX HYSTERECTOMY      HX LOBECTOMY      LUNG CANCER SURGERY      PORTACATH PLACEMENT           Social History     Socioeconomic History    Marital status: Married     Spouse name: Not on file    Number of children: Not on file    Years of education: Not on file    Highest education level: Not on file   Occupational History    Not on file   Tobacco Use    Smoking status: Never    Smokeless tobacco: Never   Vaping Use    Vaping status: Never Used   Substance and Sexual Activity    Alcohol use: Never    Drug use: Never    Sexual activity: Not Currently    Other Topics Concern    Not on file   Social History Narrative    Not on file     Social Determinants of Health     Financial Resource Strain: Not on file   Transportation Needs: Not on file   Social Connections: Low Risk  (10/07/2022)    Social Connections     SDOH Social Isolation: 5 or more times a week   Intimate Partner Violence: Not on file   Housing Stability: Not on file     Family Medical History:       Problem Relation (Age of Onset)    Cancer Brother            Current Outpatient Medications   Medication Sig    atorvastatin (LIPITOR) 40 mg Oral Tablet Take 1 Tablet (40 mg total) by mouth Every evening for 30 days    esomeprazole magnesium (NEXIUM) 40 mg Oral Capsule, Delayed Release(E.C.) Take 1 Capsule (40 mg total) by mouth Every morning before breakfast    ezetimibe (ZETIA) 10 mg Oral Tablet Take 1 Tablet (10 mg total) by mouth Every evening    famotidine (PEPCID) 40 mg Oral Tablet Take 1 Tablet (40 mg total) by mouth Once a day    magnesium oxide (MAG-OX) 400 mg Oral Tablet Take 1 Tablet (400 mg total) by mouth Twice daily    metFORMIN (GLUCOPHAGE XR) 500 mg Oral Tablet Sustained Release 24 hr Take 1 Tablet (500 mg total) by mouth Twice daily    multivitamin with iron Oral Tablet Take 1 Tablet by mouth Once a day    ondansetron (ZOFRAN ODT) 4 mg Oral Tablet, Rapid Dissolve Take 1 Tablet (4 mg total) by mouth Every 8 hours as needed for Nausea/Vomiting Indications: prevent nausea and vomiting from cancer chemotherapy    sertraline (ZOLOFT) 50 mg Oral Tablet Take 1 Tablet (50 mg total) by mouth Once a day    telmisartan (MICARDIS) 80 mg Oral Tablet Take 0.5 Tablets (40 mg total) by mouth Once a day    traMADoL (ULTRAM) 50 mg Oral Tablet Take 1 Tablet (50 mg total) by mouth Every 4 hours as needed for Pain    XARELTO 20 mg Oral Tablet TAKE 1 TABLET BY MOUTH EVERY EVENING WITH DINNER  Allergies   Allergen Reactions    Adhesive Rash    Ceclor [Cefaclor]  Other Adverse Reaction (Add comment)     Pt  states a Ceclor pill gave her blisters in her mouth.       PHYSICAL EXAM:  BP (!) 161/86 (Site: Left Arm, Patient Position: Sitting, Cuff Size: Adult)   Pulse 100   Temp 37 C (98.6 F) (Thermal Scan)   Ht 1.626 m (5\' 4" )   Wt 90.9 kg (200 lb 4.8 oz)   SpO2 95%   BMI 34.38 kg/m        ECOG Status: (1) Restricted in physically strenuous activity, ambulatory and able to do work of light nature   Physical Exam  Vitals and nursing note reviewed.   Constitutional:       Appearance: Normal appearance.   HENT:      Head: Normocephalic.      Nose: Nose normal.      Mouth/Throat:      Mouth: Mucous membranes are moist.      Pharynx: Oropharynx is clear.   Eyes:      General: No scleral icterus.     Extraocular Movements: Extraocular movements intact.   Cardiovascular:      Rate and Rhythm: Normal rate and regular rhythm.      Pulses: Normal pulses.      Heart sounds: Normal heart sounds.   Pulmonary:      Effort: Pulmonary effort is normal.      Breath sounds: Normal breath sounds.   Abdominal:      General: Bowel sounds are normal.      Palpations: Abdomen is soft.   Musculoskeletal:         General: Normal range of motion.      Cervical back: Normal range of motion and neck supple.   Skin:     General: Skin is warm and dry.   Neurological:      General: No focal deficit present.      Mental Status: She is alert and oriented to person, place, and time. Mental status is at baseline.   Psychiatric:         Mood and Affect: Mood normal.         Behavior: Behavior normal.         Thought Content: Thought content normal.         Judgment: Judgment normal.          DIAGNOSTIC DATA:  No results found for this or any previous visit (from the past 16109 hour(s)).    LABS:   CBC  Diff   Lab Results   Component Value Date/Time    WBC 2.8 (L) 12/27/2022 08:14 AM    HGB 9.1 (L) 12/27/2022 08:14 AM    HCT 28.2 (L) 12/27/2022 08:14 AM    PLTCNT 214 12/27/2022 08:14 AM    RBC 3.24 (L) 12/27/2022 08:14 AM    MCV 87.0 12/27/2022 08:14  AM    MCHC 32.1 (L) 12/27/2022 08:14 AM    MCH 28.0 12/27/2022 08:14 AM    RDW 17.5 12/27/2022 08:14 AM    MPV 9.1 12/27/2022 08:14 AM    Lab Results   Component Value Date/Time    PMNS 53 12/27/2022 08:14 AM    LYMPHOCYTES 33 12/27/2022 08:14 AM    EOSINOPHIL 2 12/27/2022 08:14 AM    MONOCYTES 10 12/27/2022 08:14 AM    BASOPHILS 1 12/27/2022 08:14 AM    BASOPHILS 0.00 12/27/2022  08:14 AM    PMNABS 1.50 (L) 12/27/2022 08:14 AM    LYMPHSABS 0.90 (L) 12/27/2022 08:14 AM    EOSABS 0.10 12/27/2022 08:14 AM    MONOSABS 0.30 12/27/2022 08:14 AM    BASABS 0.04 05/30/2022 05:40 AM            Comprehensive Metabolic Profile    Lab Results   Component Value Date    SODIUM 140 12/27/2022    POTASSIUM 3.5 12/27/2022    CHLORIDE 104 12/27/2022    CO2 28 12/27/2022    ANIONGAP 8 12/27/2022    BUN 10 12/27/2022    CREATININE 1.04 12/27/2022    ALBUMIN 3.2 (L) 12/27/2022    CALCIUM 9.8 12/27/2022    GLUCOSENF 160 (H) 12/27/2022    ALKPHOS 149 (H) 12/27/2022    ALT 24 12/27/2022    AST 24 12/27/2022    TOTBILIRUBIN 0.7 12/27/2022    TOTALPROTEIN 6.3 (L) 12/27/2022         ESTIMATED GFR   Date Value Ref Range Status   12/27/2022 54 (L) >59 mL/min/1.51m^2 Final     ASSESSMENT:      ICD-10-CM    1. Breast cancer (CMS HCC)  C50.919       2. Hypomagnesemia  E83.42              PLAN:   1. Breast cancer:  Status post resection of an involved lymph node that was 2.3 cm on PET/CT.  No primary lesion was seen on imaging studies.  T0 N1 M0, stage IIA.  ER negative, HER2 Neu positive.  After resection of the node, she was rendered cancer-free radiographically.  She is on treatment with adjuvant paclitaxel and trastuzumab.  We had previously discussed the options of either no additional surgery and taking our chances versus radiation of the breast versus mastectomy.  Radiation to the axillary area will likely be required due to the lymph node that was known to be involved.  We will work on getting a restaging scan and referral appointment for  her to see Radiation Oncology for additional discussion.  2. History of lung cancer:  Continue surveillance.    3. History of gynecologic cancer: Continue surveillance as well.    Kelsey Preston was given the chance to ask questions, and these were answered to their satisfaction. The patient is welcome to call with any questions or concerns in the meantime.     On the day of the encounter, a total of 62 minutes was spent on this patient encounter including review of historical information, examination, documentation and post-visit activities.   Return in about 3 weeks (around 01/17/2023).     Benjaman Pott, APRN,FNP-BC ,12/27/2022 ,09:57     The patient's insurance company bears full legal and financial responsibility resulting from any deviations that they cause to my recommended treatment plan.   CC:  Charm Rings, MD  [address]  [place] [address]    Charm Rings, MD  [address]  [place],  [place] [address]    This note was partially generated using MModal Fluency Direct system, and there may be some incorrect words, spellings, and punctuation that were not noted in checking the note before saving.

## 2022-12-27 ENCOUNTER — Ambulatory Visit (INDEPENDENT_AMBULATORY_CARE_PROVIDER_SITE_OTHER)
Admission: RE | Admit: 2022-12-27 | Discharge: 2022-12-27 | Disposition: A | Discharge status: Home / Self Care | Payer: Medicare Other | Source: Ambulatory Visit | Attending: NURSE PRACTITIONER | Admitting: NURSE PRACTITIONER

## 2022-12-27 ENCOUNTER — Other Ambulatory Visit (INDEPENDENT_AMBULATORY_CARE_PROVIDER_SITE_OTHER): Payer: Self-pay | Admitting: HEMATOLOGY-ONCOLOGY

## 2022-12-27 ENCOUNTER — Other Ambulatory Visit: Payer: Self-pay

## 2022-12-27 ENCOUNTER — Encounter (INDEPENDENT_AMBULATORY_CARE_PROVIDER_SITE_OTHER): Payer: Self-pay | Admitting: NURSE PRACTITIONER

## 2022-12-27 ENCOUNTER — Ambulatory Visit
Admission: RE | Admit: 2022-12-27 | Discharge: 2022-12-27 | Disposition: A | Discharge status: Home / Self Care | Payer: Medicare Other | Source: Ambulatory Visit | Attending: HEMATOLOGY-ONCOLOGY

## 2022-12-27 ENCOUNTER — Ambulatory Visit (HOSPITAL_BASED_OUTPATIENT_CLINIC_OR_DEPARTMENT_OTHER): Payer: Medicare Other | Admitting: NURSE PRACTITIONER

## 2022-12-27 ENCOUNTER — Telehealth (INDEPENDENT_AMBULATORY_CARE_PROVIDER_SITE_OTHER): Payer: Self-pay | Admitting: NURSE PRACTITIONER

## 2022-12-27 ENCOUNTER — Encounter (INDEPENDENT_AMBULATORY_CARE_PROVIDER_SITE_OTHER): Payer: Self-pay | Admitting: HEMATOLOGY-ONCOLOGY

## 2022-12-27 VITALS — BP 161/86 | HR 100 | Temp 98.6°F | Ht 64.0 in | Wt 200.3 lb

## 2022-12-27 DIAGNOSIS — Z902 Acquired absence of lung [part of]: Secondary | ICD-10-CM | POA: Insufficient documentation

## 2022-12-27 DIAGNOSIS — Z9221 Personal history of antineoplastic chemotherapy: Secondary | ICD-10-CM | POA: Insufficient documentation

## 2022-12-27 DIAGNOSIS — Z8542 Personal history of malignant neoplasm of other parts of uterus: Secondary | ICD-10-CM | POA: Insufficient documentation

## 2022-12-27 DIAGNOSIS — I82412 Acute embolism and thrombosis of left femoral vein: Secondary | ICD-10-CM

## 2022-12-27 DIAGNOSIS — C50612 Malignant neoplasm of axillary tail of left female breast: Secondary | ICD-10-CM | POA: Insufficient documentation

## 2022-12-27 DIAGNOSIS — Z171 Estrogen receptor negative status [ER-]: Secondary | ICD-10-CM | POA: Insufficient documentation

## 2022-12-27 DIAGNOSIS — Z08 Encounter for follow-up examination after completed treatment for malignant neoplasm: Secondary | ICD-10-CM

## 2022-12-27 DIAGNOSIS — Z85110 Personal history of malignant carcinoid tumor of bronchus and lung: Secondary | ICD-10-CM

## 2022-12-27 DIAGNOSIS — C50919 Malignant neoplasm of unspecified site of unspecified female breast: Secondary | ICD-10-CM

## 2022-12-27 DIAGNOSIS — Z1731 Human epidermal growth factor receptor 2 positive status: Secondary | ICD-10-CM | POA: Insufficient documentation

## 2022-12-27 DIAGNOSIS — Z85118 Personal history of other malignant neoplasm of bronchus and lung: Secondary | ICD-10-CM | POA: Insufficient documentation

## 2022-12-27 DIAGNOSIS — Z9889 Other specified postprocedural states: Secondary | ICD-10-CM | POA: Insufficient documentation

## 2022-12-27 LAB — CBC WITH DIFF
BASOPHIL #: 0 10*3/uL (ref 0.00–0.10)
BASOPHIL %: 1 % (ref 0–1)
EOSINOPHIL #: 0.1 10*3/uL (ref 0.00–0.50)
EOSINOPHIL %: 2 % (ref 1–7)
HCT: 28.2 % — ABNORMAL LOW (ref 31.2–41.9)
HGB: 9.1 g/dL — ABNORMAL LOW (ref 10.9–14.3)
LYMPHOCYTE #: 0.9 10*3/uL — ABNORMAL LOW (ref 1.00–3.00)
LYMPHOCYTE %: 33 % (ref 16–44)
MCH: 28 pg (ref 24.7–32.8)
MCHC: 32.1 g/dL — ABNORMAL LOW (ref 32.3–35.6)
MCV: 87 fL (ref 75.5–95.3)
MONOCYTE #: 0.3 10*3/uL (ref 0.30–1.00)
MONOCYTE %: 10 % (ref 5–13)
MPV: 9.1 fL (ref 7.9–10.8)
NEUTROPHIL #: 1.5 10*3/uL — ABNORMAL LOW (ref 1.85–7.80)
NEUTROPHIL %: 53 % (ref 43–77)
PLATELETS: 214 10*3/uL (ref 140–440)
RBC: 3.24 10*6/uL — ABNORMAL LOW (ref 3.63–4.92)
RDW: 17.5 % (ref 12.3–17.7)
WBC: 2.8 10*3/uL — ABNORMAL LOW (ref 3.8–11.8)

## 2022-12-27 LAB — COMPREHENSIVE METABOLIC PANEL, NON-FASTING
ALBUMIN/GLOBULIN RATIO: 1 (ref 0.8–1.4)
ALBUMIN: 3.2 g/dL — ABNORMAL LOW (ref 3.5–5.7)
ALKALINE PHOSPHATASE: 149 U/L — ABNORMAL HIGH (ref 34–104)
ALT (SGPT): 24 U/L (ref 7–52)
ANION GAP: 8 mmol/L (ref 4–13)
AST (SGOT): 24 U/L (ref 13–39)
BILIRUBIN TOTAL: 0.7 mg/dL (ref 0.3–1.0)
BUN/CREA RATIO: 10 (ref 6–22)
BUN: 10 mg/dL (ref 7–25)
CALCIUM, CORRECTED: 10.4 mg/dL (ref 8.9–10.8)
CALCIUM: 9.8 mg/dL (ref 8.6–10.3)
CHLORIDE: 104 mmol/L (ref 98–107)
CO2 TOTAL: 28 mmol/L (ref 21–31)
CREATININE: 1.04 mg/dL (ref 0.60–1.30)
ESTIMATED GFR: 54 mL/min/{1.73_m2} — ABNORMAL LOW (ref >59)
GLOBULIN: 3.1 (ref 2.9–5.4)
GLUCOSE: 160 mg/dL — ABNORMAL HIGH (ref 74–109)
OSMOLALITY, CALCULATED: 282 mosm/kg (ref 270–290)
POTASSIUM: 3.5 mmol/L (ref 3.5–5.1)
PROTEIN TOTAL: 6.3 g/dL — ABNORMAL LOW (ref 6.4–8.9)
SODIUM: 140 mmol/L (ref 136–145)

## 2022-12-27 LAB — MAGNESIUM: MAGNESIUM: 0.8 mg/dL — CL (ref 1.9–2.7)

## 2022-12-27 MED ORDER — MAGNESIUM SULFATE 1 GRAM/100 ML IN DEXTROSE 5 % INTRAVENOUS PIGGYBACK
INJECTION | INTRAVENOUS | Status: AC
Start: 2022-12-27 — End: 2022-12-27
  Filled 2022-12-27: qty 600

## 2022-12-27 MED ORDER — MAGNESIUM SULFATE 1 GRAM/100 ML IN DEXTROSE 5 % INTRAVENOUS PIGGYBACK
1.0000 g | INJECTION | INTRAVENOUS | Status: AC
Start: 2022-12-27 — End: 2022-12-27
  Administered 2022-12-27 (×2): 0 g via INTRAVENOUS
  Administered 2022-12-27: 1 g via INTRAVENOUS
  Administered 2022-12-27 (×2): 0 g via INTRAVENOUS
  Administered 2022-12-27 (×4): 1 g via INTRAVENOUS
  Administered 2022-12-27 (×2): 0 g via INTRAVENOUS
  Administered 2022-12-27: 1 g via INTRAVENOUS

## 2022-12-27 NOTE — Nurses Notes (Signed)
0626: Patient arrived via w/c with multiple family members at side. VSS. Assessments WNL. No complaints voiced. Has already seen provider and had labs drawn.Evans Lance, RN  [phone]: Labs resulted and provider messaged. Patient will need 6 gms Magnesium today. Right port accessed per protocol.Evans Lance, RN  [phone]: 6 gms Magnesium IV per protocol.Evans Lance, RN  1340: Infusion and flush complete. VSS. Port de-accessed per policy.Evans Lance, RN  [phone]: Patient left unit via w/c with multiple family - no new distress reported.  Evans Lance, RN

## 2022-12-27 NOTE — Telephone Encounter (Signed)
[name] from the lab called results on Kelsey Preston DOB: 1940/07/11.  Mg. Level is 0.8.  Results sent to [facility] via secure chat.  Tonette Bihari, MA

## 2022-12-27 NOTE — Nursing Note (Signed)
Patient's ejection fraction in March was 60-65%, in July was 54%; sent a secure chat to Dr. [name] to notify of overall decrease in ejection fraction, new order to hold Kanjinti today, repeat echocardiogram in 4 weeks, and return to discuss treatment plan.  Patient's magnesium is 0.8 today, received orders for 6 g of IV magnesium replacement today and weekly CBC, CMP, and magnesium checks with magnesium replacement per protocol.  Sent secure chat to Woodroe Mode, RN and Evans Lance, RN informing of changes to treatment plan.

## 2022-12-27 NOTE — Addendum Note (Signed)
Addended by: Benjaman Pott ANN on: 12/27/2022 12:06 PM     Modules accepted: Orders

## 2022-12-27 NOTE — Nursing Note (Signed)
Triaged patient and placed in room 12. Vitals were good. She denied having any pain, concerns, or complaints.

## 2022-12-29 ENCOUNTER — Encounter (INDEPENDENT_AMBULATORY_CARE_PROVIDER_SITE_OTHER): Payer: Self-pay | Admitting: HEMATOLOGY-ONCOLOGY

## 2022-12-31 ENCOUNTER — Encounter (INDEPENDENT_AMBULATORY_CARE_PROVIDER_SITE_OTHER): Payer: Self-pay | Admitting: HEMATOLOGY-ONCOLOGY

## 2023-01-01 ENCOUNTER — Emergency Department (HOSPITAL_COMMUNITY): Payer: Medicare Other

## 2023-01-01 ENCOUNTER — Other Ambulatory Visit: Payer: Self-pay

## 2023-01-01 ENCOUNTER — Encounter (HOSPITAL_COMMUNITY): Payer: Self-pay

## 2023-01-01 ENCOUNTER — Emergency Department
Admission: EM | Admit: 2023-01-01 | Discharge: 2023-01-01 | Disposition: A | Discharge status: Home / Self Care | Payer: Medicare Other | Attending: Emergency Medicine | Admitting: Emergency Medicine

## 2023-01-01 DIAGNOSIS — W19XXXA Unspecified fall, initial encounter: Secondary | ICD-10-CM | POA: Insufficient documentation

## 2023-01-01 DIAGNOSIS — Z9049 Acquired absence of other specified parts of digestive tract: Secondary | ICD-10-CM | POA: Insufficient documentation

## 2023-01-01 DIAGNOSIS — W1830XA Fall on same level, unspecified, initial encounter: Secondary | ICD-10-CM

## 2023-01-01 DIAGNOSIS — Y92009 Unspecified place in unspecified non-institutional (private) residence as the place of occurrence of the external cause: Secondary | ICD-10-CM

## 2023-01-01 DIAGNOSIS — M545 Low back pain, unspecified: Secondary | ICD-10-CM

## 2023-01-01 LAB — CBC WITH DIFF
BASOPHIL #: 0 10*3/uL (ref 0.00–0.10)
BASOPHIL %: 1 % (ref 0–1)
EOSINOPHIL #: 0 10*3/uL (ref 0.00–0.50)
EOSINOPHIL %: 2 % (ref 1–7)
HCT: 25.6 % — ABNORMAL LOW (ref 31.2–41.9)
HGB: 8.4 g/dL — ABNORMAL LOW (ref 10.9–14.3)
LYMPHOCYTE #: 0.8 10*3/uL — ABNORMAL LOW (ref 1.00–3.00)
LYMPHOCYTE %: 25 % (ref 16–44)
MCH: 28.3 pg (ref 24.7–32.8)
MCHC: 32.9 g/dL (ref 32.3–35.6)
MCV: 86.1 fL (ref 75.5–95.3)
MONOCYTE #: 0.5 10*3/uL (ref 0.30–1.00)
MONOCYTE %: 14 % — ABNORMAL HIGH (ref 5–13)
MPV: 8.5 fL (ref 7.9–10.8)
NEUTROPHIL #: 1.9 10*3/uL (ref 1.85–7.80)
NEUTROPHIL %: 59 % (ref 43–77)
PLATELETS: 174 10*3/uL (ref 140–440)
RBC: 2.98 10*6/uL — ABNORMAL LOW (ref 3.63–4.92)
RDW: 17.2 % (ref 12.3–17.7)
WBC: 3.2 10*3/uL — ABNORMAL LOW (ref 3.8–11.8)

## 2023-01-01 LAB — COMPREHENSIVE METABOLIC PANEL, NON-FASTING
ALBUMIN/GLOBULIN RATIO: 1.1 (ref 0.8–1.4)
ALBUMIN: 3 g/dL — ABNORMAL LOW (ref 3.5–5.7)
ALKALINE PHOSPHATASE: 145 U/L — ABNORMAL HIGH (ref 34–104)
ALT (SGPT): 17 U/L (ref 7–52)
ANION GAP: 3 mmol/L — ABNORMAL LOW (ref 4–13)
AST (SGOT): 22 U/L (ref 13–39)
BILIRUBIN TOTAL: 0.8 mg/dL (ref 0.3–1.0)
BUN/CREA RATIO: 12 (ref 6–22)
BUN: 11 mg/dL (ref 7–25)
CALCIUM, CORRECTED: 10.4 mg/dL (ref 8.9–10.8)
CALCIUM: 9.6 mg/dL (ref 8.6–10.3)
CHLORIDE: 108 mmol/L — ABNORMAL HIGH (ref 98–107)
CO2 TOTAL: 27 mmol/L (ref 21–31)
CREATININE: 0.93 mg/dL (ref 0.60–1.30)
ESTIMATED GFR: 62 mL/min/{1.73_m2} (ref >59)
GLOBULIN: 2.7 — ABNORMAL LOW (ref 2.9–5.4)
GLUCOSE: 164 mg/dL — ABNORMAL HIGH (ref 74–109)
OSMOLALITY, CALCULATED: 279 mosm/kg (ref 270–290)
POTASSIUM: 3.3 mmol/L — ABNORMAL LOW (ref 3.5–5.1)
PROTEIN TOTAL: 5.7 g/dL — ABNORMAL LOW (ref 6.4–8.9)
SODIUM: 138 mmol/L (ref 136–145)

## 2023-01-01 LAB — MAGNESIUM: MAGNESIUM: 1.1 mg/dL — ABNORMAL LOW (ref 1.9–2.7)

## 2023-01-01 MED ORDER — MAGNESIUM SULFATE 1 GRAM/100 ML IN DEXTROSE 5 % INTRAVENOUS PIGGYBACK
1.0000 g | INJECTION | INTRAVENOUS | Status: AC
Start: 2023-01-01 — End: 2023-01-01
  Administered 2023-01-01: 1 g via INTRAVENOUS
  Administered 2023-01-01 (×3): 0 g via INTRAVENOUS
  Administered 2023-01-01 (×2): 1 g via INTRAVENOUS
  Administered 2023-01-01: 0 g via INTRAVENOUS
  Administered 2023-01-01: 1 g via INTRAVENOUS

## 2023-01-01 MED ORDER — MAGNESIUM SULFATE 1 GRAM/100 ML IN DEXTROSE 5 % INTRAVENOUS PIGGYBACK
INJECTION | INTRAVENOUS | Status: AC
Start: 2023-01-01 — End: 2023-01-01
  Filled 2023-01-01: qty 400

## 2023-01-01 MED ORDER — LABETALOL 5 MG/ML INTRAVENOUS SOLUTION
INTRAVENOUS | Status: AC
Start: 2023-01-01 — End: 2023-01-01
  Filled 2023-01-01: qty 20

## 2023-01-01 MED ORDER — LABETALOL 5 MG/ML INTRAVENOUS SOLUTION
10.0000 mg | INTRAVENOUS | Status: AC
Start: 2023-01-01 — End: 2023-01-01
  Administered 2023-01-01: 5 mg via INTRAVENOUS

## 2023-01-01 MED ORDER — CLONIDINE HCL 0.1 MG TABLET
0.1000 mg | ORAL_TABLET | ORAL | Status: DC
Start: 2023-01-01 — End: 2023-01-01

## 2023-01-01 NOTE — Discharge Instructions (Signed)
Be sure to take your medications as directed.     Return to the Emergency Department if worse and as needed.

## 2023-01-01 NOTE — ED Triage Notes (Signed)
To ED via [facility] with c/o lower back and buttock pain after fall yesterday evening.    EMS: glucose 210, 18g IV left AC

## 2023-01-01 NOTE — ED Nurses Note (Signed)
Patient discharged home.  AVS reviewed with patient.  A written copy of the AVS and discharge instructions was given to the patient.  Questions sufficiently answered as needed.  Patient encouraged to follow up with PCP as indicated.  In the event of an emergency, patient/care giver instructed to call 911 or go to the nearest emergency room. IV removed and pressure dressing applied. Pt left department via wheelchair.

## 2023-01-01 NOTE — ED Provider Notes (Signed)
[facility]  Emergency Department  Attending Provider Note      CHIEF COMPLAINT  Chief Complaint   Patient presents with    Fall    Back Pain     HISTORY OF PRESENT ILLNESS  Kelsey Preston, date of birth 08/07/1940, is a 82 y.o. female who presented to the Emergency Department via EMS due to low back pain.  The patient states she fell at home at around 9:00 p.m.Marland Kitchen  She states she lost her balance and sat down into the floor.  She states he did not strike her head.    PAST MEDICAL/SURGICAL/FAMILY/SOCIAL HISTORY  Past Medical History:   Diagnosis Date    Diabetes mellitus, type 2 (CMS HCC)     Embolism (CMS HCC)     left leg    Endometrial cancer (CMS HCC)     HTN (hypertension)     Hx of breast cancer     Lung cancer (CMS HCC)     Macular degeneration (senile) of retina        Past Surgical History:   Procedure Laterality Date    HX CHOLECYSTECTOMY      HX COLONOSCOPY      HX HYSTERECTOMY      HX LOBECTOMY      LUNG CANCER SURGERY      PORTACATH PLACEMENT         Family Medical History:       Problem Relation (Age of Onset)    Cancer Brother          Social History     Socioeconomic History    Marital status: Married   Tobacco Use    Smoking status: Never    Smokeless tobacco: Never   Vaping Use    Vaping status: Never Used   Substance and Sexual Activity    Alcohol use: Never    Drug use: Never    Sexual activity: Not Currently     Social Determinants of Health     Social Connections: Low Risk  (10/07/2022)    Social Connections     SDOH Social Isolation: 5 or more times a week      ALLERGIES  Allergies   Allergen Reactions    Adhesive Rash    Ceclor [Cefaclor]  Other Adverse Reaction (Add comment)     Pt states a Ceclor pill gave her blisters in her mouth.       PHYSICAL EXAM  VITAL SIGNS:  Filed Vitals:    01/01/23 0339 01/01/23 0415 01/01/23 0515   BP: (!) 172/103 (!) 116/103 (!) 129/98   Pulse: 97 95 94   Resp: 18 16 19    Temp: 36.2 C (97.2 F)     SpO2: 98% 93% 91%     GENERAL: PATIENT IS ALERT  AND ORIENTED TO PERSON, PLACE, AND TIME.  HEAD: NORMOCEPHALIC AND ATRAUMATIC.  EYES: PUPILS EQUALLY ROUND AND REACT TO LIGHT. EXTRAOCULAR MOVEMENTS INTACT.  EARS: GROSS HEARING INTACT. EXTERNAL EARS WITHIN NORMAL LIMITS.  NOSE: NO SEPTAL DEVIATION. NASAL PASSAGES CLEAR.  MOUTH:  NORMAL DENTITION. MOIST ORAL MUCOSA.  THROAT:  NO ERYTHEMA OR EXUDATE OF THE PHARYNX.  NECK: SUPPLE. TRACHEA MIDLINE.  CARDIOVASCULAR: REGULAR RATE, AND RHYTHM. NO MURMUR.  LUNGS: CLEAR TO AUSCULTATION BILATERAL.  ABDOMEN: SOFT, NON-TENDER, NON-DISTENDED, AND BOWEL SOUNDS ARE PRESENT.  GENITOURINARY: DEFERRED.  RECTAL: DEFERRED.  EXTREMITIES: NO CYANOSIS, CLUBBING, OR EDEMA.  SKIN: WARM AND DRY.  NEUROLOGIC: CRANIAL NERVES II THROUGH XII ARE GROSSLY INTACT. MOVES ALL 4  EXTREMITIES.  PSYCHIATRIC: JUDGMENT AND INSIGHT ARE SEEMINGLY INTACT. MOOD AND AFFECT ARE APPROPRIATE FOR THE SITUATION.    DIAGNOSTICS  Labs:  Labs listed below were reviewed and interpreted by me.  Results for orders placed or performed during the hospital encounter of 01/01/23   COMPREHENSIVE METABOLIC PANEL, NON-FASTING   Result Value Ref Range    SODIUM 138 136 - 145 mmol/L    POTASSIUM 3.3 (L) 3.5 - 5.1 mmol/L    CHLORIDE 108 (H) 98 - 107 mmol/L    CO2 TOTAL 27 21 - 31 mmol/L    ANION GAP 3 (L) 4 - 13 mmol/L    BUN 11 7 - 25 mg/dL    CREATININE 5.28 4.13 - 1.30 mg/dL    BUN/CREA RATIO 12 6 - 22    ESTIMATED GFR 62 >59 mL/min/1.19m^2    ALBUMIN 3.0 (L) 3.5 - 5.7 g/dL    CALCIUM 9.6 8.6 - 24.4 mg/dL    GLUCOSE 010 (H) 74 - 109 mg/dL    ALKALINE PHOSPHATASE 145 (H) 34 - 104 U/L    ALT (SGPT) 17 7 - 52 U/L    AST (SGOT) 22 13 - 39 U/L    BILIRUBIN TOTAL 0.8 0.3 - 1.0 mg/dL    PROTEIN TOTAL 5.7 (L) 6.4 - 8.9 g/dL    ALBUMIN/GLOBULIN RATIO 1.1 0.8 - 1.4    OSMOLALITY, CALCULATED 279 270 - 290 mOsm/kg    CALCIUM, CORRECTED 10.4 8.9 - 10.8 mg/dL    GLOBULIN 2.7 (L) 2.9 - 5.4   MAGNESIUM   Result Value Ref Range    MAGNESIUM 1.1 (L) 1.9 - 2.7 mg/dL   CBC WITH DIFF   Result Value  Ref Range    WBC 3.2 (L) 3.8 - 11.8 x10^3/uL    RBC 2.98 (L) 3.63 - 4.92 x10^6/uL    HGB 8.4 (L) 10.9 - 14.3 g/dL    HCT 27.2 (L) 53.6 - 41.9 %    MCV 86.1 75.5 - 95.3 fL    MCH 28.3 24.7 - 32.8 pg    MCHC 32.9 32.3 - 35.6 g/dL    RDW 64.4 03.4 - 74.2 %    PLATELETS 174 140 - 440 x10^3/uL    MPV 8.5 7.9 - 10.8 fL    NEUTROPHIL % 59 43 - 77 %    LYMPHOCYTE % 25 16 - 44 %    MONOCYTE % 14 (H) 5 - 13 %    EOSINOPHIL % 2 1 - 7 %    BASOPHIL % 1 0 - 1 %    NEUTROPHIL # 1.90 1.85 - 7.80 x10^3/uL    LYMPHOCYTE # 0.80 (L) 1.00 - 3.00 x10^3/uL    MONOCYTE # 0.50 0.30 - 1.00 x10^3/uL    EOSINOPHIL # 0.00 0.00 - 0.50 x10^3/uL    BASOPHIL # 0.00 0.00 - 0.10 x10^3/uL     Radiology:         Medical Decision Making  This patient presented to the ED after a fall at home.  She states she lost her balance and sat into the floor.  She complains of low back pain.  Denies other injury.  The patient also has history of low magnesium.  Mag 1.1 today.  The patient was given 4 gm in the ED.  CT shows no acute fracture per radiology.  The patient was discharged home.      CLINICAL IMPRESSION  Clinical Impression   Hypomagnesemia (Primary)   Fall from standing  DISPOSITION  Discharged       DISCHARGE MEDICATIONS  Current Discharge Medication List          //Alphus Zeck Hyacinth Meeker D.O.   01/01/2023, 04:03   [facility]  Department of Emergency Medicine  [facility]    Contents of the document, in whole or in part, are completed utilizing M*Modal dictation technology, please forgive any typographical errors that may exist.   -----

## 2023-01-03 ENCOUNTER — Ambulatory Visit (HOSPITAL_COMMUNITY)
Admission: RE | Admit: 2023-01-03 | Discharge: 2023-01-03 | Disposition: A | Discharge status: Home / Self Care | Payer: Medicare Other | Source: Ambulatory Visit | Attending: HEMATOLOGY-ONCOLOGY | Admitting: HEMATOLOGY-ONCOLOGY

## 2023-01-03 ENCOUNTER — Other Ambulatory Visit: Payer: Self-pay

## 2023-01-03 LAB — COMPREHENSIVE METABOLIC PANEL, NON-FASTING
ALBUMIN/GLOBULIN RATIO: 1 (ref 0.8–1.4)
ALBUMIN: 3.1 g/dL — ABNORMAL LOW (ref 3.5–5.7)
ALKALINE PHOSPHATASE: 172 U/L — ABNORMAL HIGH (ref 34–104)
ALT (SGPT): 20 U/L (ref 7–52)
ANION GAP: 6 mmol/L (ref 4–13)
AST (SGOT): 29 U/L (ref 13–39)
BILIRUBIN TOTAL: 0.8 mg/dL (ref 0.3–1.0)
BUN/CREA RATIO: 14 (ref 6–22)
BUN: 12 mg/dL (ref 7–25)
CALCIUM, CORRECTED: 10.2 mg/dL (ref 8.9–10.8)
CALCIUM: 9.5 mg/dL (ref 8.6–10.3)
CHLORIDE: 108 mmol/L — ABNORMAL HIGH (ref 98–107)
CO2 TOTAL: 27 mmol/L (ref 21–31)
CREATININE: 0.83 mg/dL (ref 0.60–1.30)
ESTIMATED GFR: 71 mL/min/{1.73_m2} (ref >59)
GLOBULIN: 3.1 (ref 2.9–5.4)
GLUCOSE: 152 mg/dL — ABNORMAL HIGH (ref 74–109)
OSMOLALITY, CALCULATED: 284 mosm/kg (ref 270–290)
POTASSIUM: 3.6 mmol/L (ref 3.5–5.1)
PROTEIN TOTAL: 6.2 g/dL — ABNORMAL LOW (ref 6.4–8.9)
SODIUM: 141 mmol/L (ref 136–145)

## 2023-01-03 LAB — CBC WITH DIFF
BASOPHIL #: 0 10*3/uL (ref 0.00–0.10)
BASOPHIL %: 1 % (ref 0–1)
EOSINOPHIL #: 0.1 10*3/uL (ref 0.00–0.50)
EOSINOPHIL %: 2 % (ref 1–7)
HCT: 28.3 % — ABNORMAL LOW (ref 31.2–41.9)
HGB: 9.4 g/dL — ABNORMAL LOW (ref 10.9–14.3)
LYMPHOCYTE #: 1.1 10*3/uL (ref 1.00–3.00)
LYMPHOCYTE %: 29 % (ref 16–44)
MCH: 28.5 pg (ref 24.7–32.8)
MCHC: 33.2 g/dL (ref 32.3–35.6)
MCV: 85.9 fL (ref 75.5–95.3)
MONOCYTE #: 0.4 10*3/uL (ref 0.30–1.00)
MONOCYTE %: 10 % (ref 5–13)
MPV: 9.3 fL (ref 7.9–10.8)
NEUTROPHIL #: 2.1 10*3/uL (ref 1.85–7.80)
NEUTROPHIL %: 58 % (ref 43–77)
PLATELETS: 175 10*3/uL (ref 140–440)
RBC: 3.3 10*6/uL — ABNORMAL LOW (ref 3.63–4.92)
RDW: 17.8 % — ABNORMAL HIGH (ref 12.3–17.7)
WBC: 3.6 10*3/uL — ABNORMAL LOW (ref 3.8–11.8)

## 2023-01-03 LAB — MAGNESIUM: MAGNESIUM: 1.3 mg/dL — ABNORMAL LOW (ref 1.9–2.7)

## 2023-01-03 MED ORDER — MAGNESIUM SULFATE 1 GRAM/100 ML IN DEXTROSE 5 % INTRAVENOUS PIGGYBACK
INJECTION | INTRAVENOUS | Status: AC
Start: 2023-01-03 — End: 2023-01-03
  Filled 2023-01-03: qty 400

## 2023-01-03 MED ORDER — MAGNESIUM SULFATE 1 GRAM/100 ML IN DEXTROSE 5 % INTRAVENOUS PIGGYBACK
1.0000 g | INJECTION | INTRAVENOUS | Status: AC
Start: 2023-01-03 — End: 2023-01-03
  Administered 2023-01-03 (×2): 0 g via INTRAVENOUS
  Administered 2023-01-03 (×2): 1 g via INTRAVENOUS
  Administered 2023-01-03 (×2): 0 g via INTRAVENOUS
  Administered 2023-01-03 (×2): 1 g via INTRAVENOUS

## 2023-01-03 NOTE — Nurses Notes (Signed)
1007-Arrived to OP ONC in wheelchair and accompanied by spouse, daughter and daughter-in-law.  Here for labs and possible electrolyte replacement. Junie Panning, RN=  1035-Right upper chest PortACath accessed per protocol, using sterile technique.  Excellent blood return noted and flushed without difficulty.  10ml blood wasted and then labs obtained from port.  Flushed with additional 20ml NS.  Transparent, non-bordered dressing applied and tubing secured.  Tolerated well.  Labs sen to main hospital lab through tube system. Junie Panning, RN

## 2023-01-03 NOTE — Nurses Notes (Signed)
1115: Patient and family to room 232 to await lab results. Reports severe back pain from recent fall-rated 8. Evans Lance, RN  [phone]: A total of 4 gms macnesium given IV-each over 30 minutes per pharmacy. Messaged Dr [name] for short term pain relief meds-but he said pt's PCP should provide as her pain was not caused by her cancer. Evans Lance, RN  [phone]: Infusions complete.  Port de-accessed per policy. Evans Lance, RN  [phone]: Pt left via w/c with multiple family. No complaints voiced other than back pain. Encouraged to check with her PCP if pain does not improve.Evans Lance, RN

## 2023-01-05 ENCOUNTER — Other Ambulatory Visit: Payer: Self-pay

## 2023-01-05 ENCOUNTER — Inpatient Hospital Stay (HOSPITAL_COMMUNITY): Payer: Medicare Other

## 2023-01-05 ENCOUNTER — Emergency Department (HOSPITAL_COMMUNITY): Payer: Medicare Other

## 2023-01-05 ENCOUNTER — Inpatient Hospital Stay
Admission: EM | Admit: 2023-01-05 | Discharge: 2023-01-09 | DRG: 690 | Disposition: A | Discharge status: Home Health | Payer: Medicare Other | Source: Ambulatory Visit | Attending: HOSPITALIST | Admitting: HOSPITALIST

## 2023-01-05 ENCOUNTER — Encounter (HOSPITAL_COMMUNITY): Payer: Self-pay

## 2023-01-05 DIAGNOSIS — E119 Type 2 diabetes mellitus without complications: Secondary | ICD-10-CM | POA: Diagnosis present

## 2023-01-05 DIAGNOSIS — Z9181 History of falling: Secondary | ICD-10-CM

## 2023-01-05 DIAGNOSIS — M25552 Pain in left hip: Secondary | ICD-10-CM | POA: Diagnosis present

## 2023-01-05 DIAGNOSIS — I10 Essential (primary) hypertension: Secondary | ICD-10-CM | POA: Diagnosis present

## 2023-01-05 DIAGNOSIS — M545 Low back pain, unspecified: Secondary | ICD-10-CM | POA: Diagnosis present

## 2023-01-05 DIAGNOSIS — H3530 Unspecified macular degeneration: Secondary | ICD-10-CM | POA: Diagnosis present

## 2023-01-05 DIAGNOSIS — T148XXA Other injury of unspecified body region, initial encounter: Secondary | ICD-10-CM

## 2023-01-05 DIAGNOSIS — K219 Gastro-esophageal reflux disease without esophagitis: Secondary | ICD-10-CM | POA: Diagnosis present

## 2023-01-05 DIAGNOSIS — W010XXA Fall on same level from slipping, tripping and stumbling without subsequent striking against object, initial encounter: Secondary | ICD-10-CM | POA: Diagnosis present

## 2023-01-05 DIAGNOSIS — Z7984 Long term (current) use of oral hypoglycemic drugs: Secondary | ICD-10-CM

## 2023-01-05 DIAGNOSIS — Y92009 Unspecified place in unspecified non-institutional (private) residence as the place of occurrence of the external cause: Secondary | ICD-10-CM | POA: Diagnosis present

## 2023-01-05 DIAGNOSIS — Z7901 Long term (current) use of anticoagulants: Secondary | ICD-10-CM

## 2023-01-05 DIAGNOSIS — Z86718 Personal history of other venous thrombosis and embolism: Secondary | ICD-10-CM

## 2023-01-05 DIAGNOSIS — C50919 Malignant neoplasm of unspecified site of unspecified female breast: Secondary | ICD-10-CM | POA: Diagnosis present

## 2023-01-05 DIAGNOSIS — Z9221 Personal history of antineoplastic chemotherapy: Secondary | ICD-10-CM

## 2023-01-05 DIAGNOSIS — M62838 Other muscle spasm: Secondary | ICD-10-CM | POA: Diagnosis present

## 2023-01-05 DIAGNOSIS — Z79899 Other long term (current) drug therapy: Secondary | ICD-10-CM

## 2023-01-05 DIAGNOSIS — W19XXXS Unspecified fall, sequela: Secondary | ICD-10-CM | POA: Diagnosis present

## 2023-01-05 DIAGNOSIS — Z66 Do not resuscitate: Secondary | ICD-10-CM | POA: Diagnosis present

## 2023-01-05 DIAGNOSIS — R8290 Unspecified abnormal findings in urine: Principal | ICD-10-CM

## 2023-01-05 DIAGNOSIS — Z7960 Long term (current) use of unspecified immunomodulators and immunosuppressants: Secondary | ICD-10-CM

## 2023-01-05 DIAGNOSIS — N390 Urinary tract infection, site not specified: Principal | ICD-10-CM | POA: Diagnosis present

## 2023-01-05 DIAGNOSIS — K2970 Gastritis, unspecified, without bleeding: Secondary | ICD-10-CM

## 2023-01-05 LAB — COMPREHENSIVE METABOLIC PANEL, NON-FASTING
ALBUMIN/GLOBULIN RATIO: 1 (ref 0.8–1.4)
ALBUMIN: 2.9 g/dL — ABNORMAL LOW (ref 3.5–5.7)
ALKALINE PHOSPHATASE: 177 U/L — ABNORMAL HIGH (ref 34–104)
ALT (SGPT): 24 U/L (ref 7–52)
ANION GAP: 5 mmol/L (ref 4–13)
AST (SGOT): 33 U/L (ref 13–39)
BILIRUBIN TOTAL: 0.9 mg/dL (ref 0.3–1.0)
BUN/CREA RATIO: 12 (ref 6–22)
BUN: 10 mg/dL (ref 7–25)
CALCIUM, CORRECTED: 10.6 mg/dL (ref 8.9–10.8)
CALCIUM: 9.7 mg/dL (ref 8.6–10.3)
CHLORIDE: 104 mmol/L (ref 98–107)
CO2 TOTAL: 28 mmol/L (ref 21–31)
CREATININE: 0.84 mg/dL (ref 0.60–1.30)
ESTIMATED GFR: 70 mL/min/{1.73_m2} (ref >59)
GLOBULIN: 2.8 — ABNORMAL LOW (ref 2.9–5.4)
GLUCOSE: 101 mg/dL (ref 74–109)
OSMOLALITY, CALCULATED: 273 mosm/kg (ref 270–290)
POTASSIUM: 3.8 mmol/L (ref 3.5–5.1)
PROTEIN TOTAL: 5.7 g/dL — ABNORMAL LOW (ref 6.4–8.9)
SODIUM: 137 mmol/L (ref 136–145)

## 2023-01-05 LAB — URINALYSIS, MICROSCOPIC
HYALINE CASTS: 9 /[LPF] — ABNORMAL HIGH (ref <0)
RBCS: 6 /[HPF] — ABNORMAL HIGH (ref <4)
RENAL EPITHELIAL CELLS URINE: <1 /[HPF] (ref <6)
SQUAMOUS EPITHELIAL: 1 /[HPF] (ref <28)
TRANSITIONAL EPITHELIAL CELLS URINE: <1 /[HPF] (ref <6)
WBCS: 60 /[HPF] — ABNORMAL HIGH (ref <6)

## 2023-01-05 LAB — CBC WITH DIFF
BASOPHIL #: 0 10*3/uL (ref 0.00–0.10)
BASOPHIL %: 1 % (ref 0–1)
EOSINOPHIL #: 0.1 10*3/uL (ref 0.00–0.50)
EOSINOPHIL %: 1 % (ref 1–7)
HCT: 27.2 % — ABNORMAL LOW (ref 31.2–41.9)
HGB: 9 g/dL — ABNORMAL LOW (ref 10.9–14.3)
LYMPHOCYTE #: 1.2 10*3/uL (ref 1.00–3.00)
LYMPHOCYTE %: 27 % (ref 16–44)
MCH: 28.5 pg (ref 24.7–32.8)
MCHC: 33 g/dL (ref 32.3–35.6)
MCV: 86.1 fL (ref 75.5–95.3)
MONOCYTE #: 0.5 10*3/uL (ref 0.30–1.00)
MONOCYTE %: 11 % (ref 5–13)
MPV: 8.6 fL (ref 7.9–10.8)
NEUTROPHIL #: 2.5 10*3/uL (ref 1.85–7.80)
NEUTROPHIL %: 59 % (ref 43–77)
PLATELETS: 156 10*3/uL (ref 140–440)
RBC: 3.16 10*6/uL — ABNORMAL LOW (ref 3.63–4.92)
RDW: 17.9 % — ABNORMAL HIGH (ref 12.3–17.7)
WBC: 4.3 10*3/uL (ref 3.8–11.8)

## 2023-01-05 LAB — URINALYSIS, MACROSCOPIC
BILIRUBIN: NEGATIVE mg/dL
BLOOD: NEGATIVE mg/dL
GLUCOSE: NEGATIVE mg/dL
KETONES: NEGATIVE mg/dL
LEUKOCYTES: 250 WBCs/uL — AB
NITRITE: NEGATIVE
PH: 5.5 (ref 5.0–9.0)
PROTEIN: 20 mg/dL
SPECIFIC GRAVITY: 1.018 (ref 1.002–1.030)
UROBILINOGEN: NORMAL mg/dL

## 2023-01-05 LAB — LACTIC ACID LEVEL W/ REFLEX FOR LEVEL >2.0: LACTIC ACID: 1.1 mmol/L (ref 0.5–2.2)

## 2023-01-05 LAB — BLUE TOP TUBE

## 2023-01-05 LAB — POC BLOOD GLUCOSE (RESULTS)
GLUCOSE, POC: 167 mg/dL — ABNORMAL HIGH (ref 70–100)
GLUCOSE, POC: 124 mg/dL — ABNORMAL HIGH (ref 70–100)

## 2023-01-05 LAB — MAGNESIUM: MAGNESIUM: 1.3 mg/dL — ABNORMAL LOW (ref 1.9–2.7)

## 2023-01-05 LAB — GOLD TOP TUBE

## 2023-01-05 MED ORDER — SODIUM CHLORIDE 0.9 % (FLUSH) INJECTION SYRINGE
20.0000 mL | INJECTION | INTRAMUSCULAR | Status: DC | PRN
Start: 2023-01-05 — End: 2023-01-09

## 2023-01-05 MED ORDER — SODIUM CHLORIDE 0.9 % INTRAVENOUS PIGGYBACK
INJECTION | INTRAVENOUS | Status: AC
Start: 2023-01-05 — End: 2023-01-05
  Filled 2023-01-05: qty 100

## 2023-01-05 MED ORDER — ACETAMINOPHEN 325 MG TABLET
650.0000 mg | ORAL_TABLET | Freq: Four times a day (QID) | ORAL | Status: DC | PRN
Start: 2023-01-05 — End: 2023-01-09
  Administered 2023-01-07: 650 mg via ORAL
  Filled 2023-01-05 (×2): qty 2

## 2023-01-05 MED ORDER — GLUCAGON 1 MG/ML SOLUTION FOR INJECTION
1.0000 mg | Freq: Once | INTRAMUSCULAR | Status: DC | PRN
Start: 2023-01-05 — End: 2023-01-09

## 2023-01-05 MED ORDER — RIVAROXABAN 10 MG TABLET
20.0000 mg | ORAL_TABLET | Freq: Every evening | ORAL | Status: DC
Start: 2023-01-05 — End: 2023-01-09
  Administered 2023-01-05 – 2023-01-09 (×5): 20 mg via ORAL
  Filled 2023-01-05 (×6): qty 2

## 2023-01-05 MED ORDER — METHOCARBAMOL 500 MG TABLET
1000.0000 mg | ORAL_TABLET | Freq: Four times a day (QID) | ORAL | Status: DC | PRN
Start: 2023-01-05 — End: 2023-01-09
  Administered 2023-01-05 – 2023-01-09 (×10): 1000 mg via ORAL
  Filled 2023-01-05 (×10): qty 2

## 2023-01-05 MED ORDER — INSULIN LISPRO 100 UNIT/ML SUB-Q SSIP VIAL
3.0000 [IU] | INJECTION | Freq: Four times a day (QID) | SUBCUTANEOUS | Status: DC
Start: 2023-01-05 — End: 2023-01-09
  Administered 2023-01-05 – 2023-01-06 (×6): 0 [IU] via SUBCUTANEOUS
  Administered 2023-01-07 (×2): 3 [IU] via SUBCUTANEOUS
  Administered 2023-01-07 (×2): 0 [IU] via SUBCUTANEOUS
  Administered 2023-01-08: 3 [IU] via SUBCUTANEOUS
  Administered 2023-01-08: 0 [IU] via SUBCUTANEOUS
  Administered 2023-01-08: 3 [IU] via SUBCUTANEOUS
  Administered 2023-01-08: 5 [IU] via SUBCUTANEOUS
  Administered 2023-01-09: 0 [IU] via SUBCUTANEOUS
  Administered 2023-01-09: 3 [IU] via SUBCUTANEOUS
  Administered 2023-01-09 (×2): 0 [IU] via SUBCUTANEOUS
  Filled 2023-01-05: qty 5
  Filled 2023-01-05 (×5): qty 3

## 2023-01-05 MED ORDER — ONDANSETRON HCL (PF) 4 MG/2 ML INJECTION SOLUTION
4.0000 mg | Freq: Three times a day (TID) | INTRAMUSCULAR | Status: DC | PRN
Start: 2023-01-05 — End: 2023-01-09
  Filled 2023-01-05 (×2): qty 2

## 2023-01-05 MED ORDER — MAGNESIUM SULFATE 1 GRAM/100 ML IN DEXTROSE 5 % INTRAVENOUS PIGGYBACK
1.0000 g | INJECTION | INTRAVENOUS | Status: AC
Start: 2023-01-05 — End: 2023-01-05
  Administered 2023-01-05 (×2): 0 g via INTRAVENOUS
  Administered 2023-01-05: 1 g via INTRAVENOUS
  Administered 2023-01-05: 0 g via INTRAVENOUS
  Administered 2023-01-05 (×2): 1 g via INTRAVENOUS
  Filled 2023-01-05: qty 200
  Filled 2023-01-05: qty 100

## 2023-01-05 MED ORDER — SODIUM CHLORIDE 0.9 % (FLUSH) INJECTION SYRINGE
10.0000 mL | INJECTION | Freq: Three times a day (TID) | INTRAMUSCULAR | Status: DC
Start: 2023-01-05 — End: 2023-01-09
  Administered 2023-01-05 – 2023-01-07 (×7): 10 mL via INTRAVENOUS
  Administered 2023-01-07: 0 mL via INTRAVENOUS
  Administered 2023-01-08 (×2): 10 mL via INTRAVENOUS
  Administered 2023-01-08: 0 mL via INTRAVENOUS
  Administered 2023-01-09 (×2): 10 mL via INTRAVENOUS

## 2023-01-05 MED ORDER — DEXTROSE 40 % ORAL GEL
15.0000 g | ORAL | Status: DC | PRN
Start: 2023-01-05 — End: 2023-01-09

## 2023-01-05 MED ORDER — HYDROCODONE 7.5 MG-ACETAMINOPHEN 325 MG TABLET
ORAL_TABLET | ORAL | Status: AC
Start: 2023-01-05 — End: 2023-01-05
  Filled 2023-01-05: qty 1

## 2023-01-05 MED ORDER — ETHYL ALCOHOL 62 % (NOZIN NASAL SANITIZER) NASAL SOLUTION - BULK BOTTLE
1.0000 | Freq: Two times a day (BID) | NASAL | Status: DC
Start: 2023-01-05 — End: 2023-01-09
  Administered 2023-01-05: 0 via NASAL
  Administered 2023-01-05 – 2023-01-09 (×8): 1 via NASAL

## 2023-01-05 MED ORDER — MAGNESIUM SULFATE 1 GRAM/100 ML IN DEXTROSE 5 % INTRAVENOUS PIGGYBACK
INJECTION | INTRAVENOUS | Status: AC
Start: 2023-01-05 — End: 2023-01-05
  Filled 2023-01-05: qty 200

## 2023-01-05 MED ORDER — DEXTROSE 50 % IN WATER (D50W) INTRAVENOUS SYRINGE
12.5000 g | INJECTION | INTRAVENOUS | Status: DC | PRN
Start: 2023-01-05 — End: 2023-01-09

## 2023-01-05 MED ORDER — LEVOFLOXACIN 500 MG TABLET
500.0000 mg | ORAL_TABLET | ORAL | Status: DC
Start: 2023-01-05 — End: 2023-01-09
  Administered 2023-01-05 – 2023-01-09 (×5): 500 mg via ORAL
  Filled 2023-01-05 (×5): qty 1

## 2023-01-05 MED ORDER — AZTREONAM 2 GRAM SOLUTION FOR INJECTION
INTRAMUSCULAR | Status: AC
Start: 2023-01-05 — End: 2023-01-05
  Filled 2023-01-05: qty 10

## 2023-01-05 MED ORDER — HYDROCODONE 5 MG-ACETAMINOPHEN 325 MG TABLET
1.0000 | ORAL_TABLET | Freq: Four times a day (QID) | ORAL | Status: DC | PRN
Start: 2023-01-05 — End: 2023-01-09
  Administered 2023-01-06 – 2023-01-08 (×2): 1 via ORAL
  Filled 2023-01-05 (×3): qty 1

## 2023-01-05 MED ORDER — IOHEXOL 350 MG IODINE/ML INTRAVENOUS SOLUTION
50.0000 mL | INTRAVENOUS | Status: AC
Start: 2023-01-05 — End: 2023-01-05
  Administered 2023-01-05: 75 mL via INTRAVENOUS

## 2023-01-05 MED ORDER — HYDROCODONE 7.5 MG-ACETAMINOPHEN 325 MG TABLET
1.0000 | ORAL_TABLET | ORAL | Status: AC
Start: 2023-01-05 — End: 2023-01-05
  Administered 2023-01-05: 1 via ORAL

## 2023-01-05 MED ORDER — MAGNESIUM SULFATE 1 GRAM/100 ML IN DEXTROSE 5 % INTRAVENOUS PIGGYBACK
1.0000 g | INJECTION | INTRAVENOUS | Status: AC
Start: 2023-01-05 — End: 2023-01-05
  Administered 2023-01-05: 0 g via INTRAVENOUS
  Administered 2023-01-05 (×2): 1 g via INTRAVENOUS

## 2023-01-05 MED ORDER — SODIUM CHLORIDE 0.9 % INTRAVENOUS PIGGYBACK
2.0000 g | INTRAVENOUS | Status: AC
Start: 2023-01-05 — End: 2023-01-05
  Administered 2023-01-05: 0 g via INTRAVENOUS
  Administered 2023-01-05: 2 g via INTRAVENOUS

## 2023-01-05 MED ORDER — RAMELTEON 8 MG TABLET
8.0000 mg | ORAL_TABLET | Freq: Every evening | ORAL | Status: DC | PRN
Start: 2023-01-05 — End: 2023-01-09
  Administered 2023-01-08: 8 mg via ORAL
  Filled 2023-01-05: qty 1

## 2023-01-05 NOTE — ED Triage Notes (Signed)
Fall 5 days ago, left lower back pain radiating around to left hip. Pt already seen for fall but is still hurting. Initial O2 sat 88% on room air    [facility]: transport, BS 132, 2l nc

## 2023-01-05 NOTE — ED Provider Notes (Signed)
[facility]  ED Primary Provider Note  Patient Name: Kelsey Preston  Patient Age: 82 y.o.  Date of Birth: 1940/12/11    Chief Complaint: Fall        History of Present Illness       Kelsey Preston is a 82 y.o. female who had concerns including Fall.  This patient is an 82 year old female who presents with a.  Patient was evaluated in days prior, with a CT scan lumbar spine without acute pathology.  The patient is complaining flank pain today.  She does have history of cancer in on chemotherapy.  Specifically, she has history of lung cancer, breast cancer, and endometrial cancer.  No incontinence reported.        Review of Systems     No other overt Review of Systems are noted to be positive except noted in the HPI.      Historical Data   History Reviewed This Encounter: Medical History  Surgical History  Family History  Social History      Physical Exam   ED Triage Vitals [01/05/23 0925]   BP (Non-Invasive) (!) 166/77   Heart Rate 91   Respiratory Rate 16   Temperature (!) 35.2 C (95.4 F)   SpO2 95 %   Weight 93 kg (205 lb)   Height 1.626 m (5\' 4" )         Nursing notes reviewed for what could be assessed. Past Medical, Surgical, and Social history reviewed for what has been completed.     Constitutional:  Chronically ill-appearing  Head: Normocephalic, atraumatic.  Mouth/Throat:  Symmetric facial movement.  Eyes: EOM grossly intact, conjunctiva normal.  Neck: Supple  Cardiovascular:  Non tachycardic Rate and Rhythm, extremities well perfused.  Pulmonary/Chest: No respiratory distress. Lungs are symmetric to auscultation bilaterally.  Abdominal: Soft, non-tender, non-distended. Non peritoneal, no rebound, no guarding.  MSK: No Lower Extremity Edema.  No spinal tenderness.  The patient reports flank related tenderness.  Skin: Warm, dry.  Scar noted over right upper chest port site.  Neuro: Appropriate, CN II-XII grossly intact.   Psych: Pleasant            Procedures      Patient Data      Labs Ordered/Reviewed   COMPREHENSIVE METABOLIC PANEL, NON-FASTING - Abnormal; Notable for the following components:       Result Value    ALBUMIN 2.9 (*)     ALKALINE PHOSPHATASE 177 (*)     PROTEIN TOTAL 5.7 (*)     GLOBULIN 2.8 (*)     All other components within normal limits    Narrative:     Estimated Glomerular Filtration Rate (eGFR) is calculated using the CKD-EPI (2021) equation, intended for patients 32 years of age and older. If gender is not documented or "unknown", there will be no eGFR calculation.     CBC WITH DIFF - Abnormal; Notable for the following components:    RBC 3.16 (*)     HGB 9.0 (*)     HCT 27.2 (*)     RDW 17.9 (*)     All other components within normal limits   URINALYSIS, MACROSCOPIC - Abnormal; Notable for the following components:    APPEARANCE Turbid (*)     LEUKOCYTES 250 (*)     All other components within normal limits   URINALYSIS, MICROSCOPIC - Abnormal; Notable for the following components:    BACTERIA Moderate (*)     CALCIUM OXALATE CRYSTALS  Occasional (*)     AMORPHOUS SEDIMENT Rare (*)     RBCS 6 (*)     WBCS 60 (*)     HYALINE CASTS 9 (*)     WHITE BLOOD CELL CLUMP Occasional (*)     All other components within normal limits   MAGNESIUM - Abnormal; Notable for the following components:    MAGNESIUM 1.3 (*)     All other components within normal limits   LACTIC ACID LEVEL W/ REFLEX FOR LEVEL >2.0 - Normal   ADULT ROUTINE BLOOD CULTURE, SET OF 2 BOTTLES (BACTERIA AND YEAST)   ADULT ROUTINE BLOOD CULTURE, SET OF 2 BOTTLES (BACTERIA AND YEAST)   URINE CULTURE,ROUTINE   CBC/DIFF    Narrative:     The following orders were created for panel order CBC/DIFF.  Procedure                               Abnormality         Status                     ---------                               -----------         ------                     CBC WITH JXBJ[[id number]]                Abnormal            Final result                 Please view results for these tests on the individual  orders.   URINALYSIS, MACROSCOPIC AND MICROSCOPIC W/CULTURE REFLEX    Narrative:     The following orders were created for panel order URINALYSIS, MACROSCOPIC AND MICROSCOPIC W/CULTURE REFLEX.  Procedure                               Abnormality         Status                     ---------                               -----------         ------                     URINALYSIS, MACROSCOPIC[[id number]]      Abnormal            Final result               URINALYSIS, MICROSCOPIC[[id number]]      Abnormal            Final result                 Please view results for these tests on the individual orders.   BLUE TOP TUBE   EXTRA TUBES    Narrative:     The following orders were created for panel order EXTRA TUBES.  Procedure  Abnormality         Status                     ---------                               -----------         ------                     BLUE TOP ZHYQ[[id number]]                                    Final result               GOLD TOP XBMW[[id number]]                                    In process                   Please view results for these tests on the individual orders.   GOLD TOP TUBE       CT ABDOMEN PELVIS W IV CONTRAST   Final Result by Edi, Radresults In (10/20 1223)   The bowel gas pattern is suspicious for possible enteritis which could be infectious   Gastritis with questionable gastric antral ulceration   Compression deformity of L1 not seen previously could indicate interval developing compression fracture. Further investigation with bone scan or MRI may be useful. (No bone retropulsion)   Nonspecific subcutaneous left mid and lower paraspinal haziness may represent edema or bruising. No large focal hematomas are seen          One or more dose reduction techniques were used (e.g., Automated exposure control, adjustment of the mA and/or kV according to patient size, use of iterative reconstruction technique).         Radiologist location ID: [id number]              Medical Decision Making          Medical Decision Making          Studies Assessed:  Lab, radiology      MDM Narrative:  This patient is a 82 year old female who presents flank related discomfort after recent fall.  Patient was evaluated by a prior emergency provider, for which a spinal CT was obtained with no acute abnormality.  CT scan of the abdomen/pelvis was obtained today showing findings consistent with hematoma, possible gastritis with ulcerative changes, and urinalysis showed possibility of urinary tract infection.  The patient unfortunately is deconditioned in the setting of her recent fall, with discomfort, for which analgesia was provided.  She was agreeable to inpatient treatment/rehab, for which the case was discussed with Dr. Reuel Boom.        Differential includes but not limited to:  Soft tissue injury: Patient does have hematoma  Infectious etiology: Patient was findings consistent with urinary tract infection  General deconditioning: The patient has not been participating in active movement secondary to her discomfort likely resulting in deconditioning  Metabolic abnormality: The patient does have noted low magnesium which is a recurrent condition for her.      Follow-Up Discussion:  Patient was updated    Please see documentation above for specific labs and radiology.  Decision for High Risk/High Medical Decision Making and Treatment in the ED:  Decision of hospitalization or escalation of hospital-level care.               ED Course as of 01/05/23 1314   Sun Jan 05, 2023   1246 Dr. Reuel Boom contacted.               Medications Administered in the ED   NS flush syringe (has no administration in time range)   NS flush syringe (has no administration in time range)   alcohol 62 % (NOZIN NASAL SANITIZER) nasal solution (0 Each Each Nostril Not Given 01/05/23 1000)   aztreonam (AZACTAM) 2 g in NS 100 mL IVPB minibag (0 g Intravenous Stopped 01/05/23 1151)   iohexol (OMNIPAQUE 350) infusion (75 mL  Intravenous Given 01/05/23 1204)       Patient will be admitted to the  service for further workup and management.    Disposition: Admitted             Based upon the clinical setting, the likely diagnosis/impression include:    Clinical Impression   Abnormal urinalysis (Primary)   Gastritis, presence of bleeding unspecified, unspecified chronicity, unspecified gastritis type   Hematoma         Current Discharge Medication List            /R. Tobey Bride, MD, Lacie Scotts  Department of Emergency Medicine  [facility]

## 2023-01-05 NOTE — H&P (Signed)
[place] [facility]    HOSPITALIST H&P    Kelsey Preston 82 y.o. female ED07/ED07   Date of Service: 01/05/2023    Date of Admission:  01/05/2023   PCP: Charm Rings, MD Code Status:Prior       Chief Complaint:  " left hip and LBP post fall "    HPI: 82 yr old female presented to the ED with left hip and left low back pain from a fall on Tuesday at home around 9pm. She uses a walker and there was a metal piece going across the floor from floor to carpet, and her walker caught and she fell straight down on her buttocks. She has had balance problems for about 4 years and family states she often falls backwards. No head trauma. She was seen here on the 16th for continued pain, seen and released. She had a CT of the lumbar that was negative for subluxation or fracture. Pt states her pain has gotten worse over the last few days, no pain when lying still, but pain is severe with movement. She describes it as a "catching" pain and "muscle spasms". She is currently being treated for breast cancer with chemo, last tx three Fridays ago. She is getting OP mag level with frequent infusions every Friday. At first with chemo she had some diarrhea, but not having now. No abdominal pain, occasional nausea from chemo, no vomiting.  CT abdomen performed in ED, question of enteritis, questionable gastritis with gastric antral ulceration and concern for possible developing L1 compression fracture. Pt states she has been told before she had a compression fracture in her L-spine. To be admitted for further evaluation and treatment.     Labs: Hgb 9.0/HCT 27.2, Mg+ 1.3, alk phos 177, urine with Lk esterase 250, with WBC's 60, RBC's 6.           ED medications:   Medications Administered in the ED   NS flush syringe (has no administration in time range)   NS flush syringe (has no administration in time range)   alcohol 62 % (NOZIN NASAL SANITIZER) nasal solution (0 Each Each Nostril Not Given 01/05/23 1000)    aztreonam (AZACTAM) 2 g in NS 100 mL IVPB minibag (0 g Intravenous Stopped 01/05/23 1151)   iohexol (OMNIPAQUE 350) infusion (75 mL Intravenous Given 01/05/23 1204)         PMHx:    Past Medical History:   Diagnosis Date    Diabetes mellitus, type 2 (CMS HCC)     Embolism (CMS HCC)     left leg    Endometrial cancer (CMS HCC)     HTN (hypertension)     Hx of breast cancer     Lung cancer (CMS HCC)     Macular degeneration (senile) of retina         PSHx:   Past Surgical History:   Procedure Laterality Date    HX CHOLECYSTECTOMY      HX COLONOSCOPY      HX HYSTERECTOMY      HX LOBECTOMY      LUNG CANCER SURGERY      PORTACATH PLACEMENT            Allergies:    Allergies   Allergen Reactions    Adhesive Rash    Ceclor [Cefaclor]  Other Adverse Reaction (Add comment)     Pt states a Ceclor pill gave her blisters in her mouth.    Social History  Social  History     Tobacco Use    Smoking status: Never    Smokeless tobacco: Never   Vaping Use    Vaping status: Never Used   Substance Use Topics    Alcohol use: Never    Drug use: Never       Family History  Family Medical History:       Problem Relation (Age of Onset)    Cancer Brother               Home Meds:      Prior to Admission medications    Medication Sig Start Date End Date Taking? Authorizing Provider   atorvastatin (LIPITOR) 40 mg Oral Tablet Take 1 Tablet (40 mg total) by mouth Every evening for 30 days 06/01/22 10/07/22  Lalla Brothers, MD   esomeprazole magnesium (NEXIUM) 40 mg Oral Capsule, Delayed Release(E.C.) Take 1 Capsule (40 mg total) by mouth Every morning before breakfast    Provider, Historical   ezetimibe (ZETIA) 10 mg Oral Tablet Take 1 Tablet (10 mg total) by mouth Every evening    Provider, Historical   famotidine (PEPCID) 40 mg Oral Tablet Take 1 Tablet (40 mg total) by mouth Once a day 08/01/22   Provider, Historical   magnesium oxide (MAG-OX) 400 mg Oral Tablet Take 1 Tablet (400 mg total) by mouth Twice daily    Provider, Historical    metFORMIN (GLUCOPHAGE XR) 500 mg Oral Tablet Sustained Release 24 hr Take 1 Tablet (500 mg total) by mouth Twice daily 08/28/22   Provider, Historical   multivitamin with iron Oral Tablet Take 1 Tablet by mouth Once a day    Provider, Historical   ondansetron (ZOFRAN ODT) 4 mg Oral Tablet, Rapid Dissolve Take 1 Tablet (4 mg total) by mouth Every 8 hours as needed for Nausea/Vomiting Indications: prevent nausea and vomiting from cancer chemotherapy 09/11/22   Lupita Dawn, MD   sertraline (ZOLOFT) 50 mg Oral Tablet Take 1 Tablet (50 mg total) by mouth Once a day    Provider, Historical   telmisartan (MICARDIS) 80 mg Oral Tablet Take 0.5 Tablets (40 mg total) by mouth Once a day    Provider, Historical   traMADoL (ULTRAM) 50 mg Oral Tablet Take 1 Tablet (50 mg total) by mouth Every 4 hours as needed for Pain 09/26/22   Duremdes, Gene B, MD   XARELTO 20 mg Oral Tablet TAKE 1 TABLET BY MOUTH EVERY EVENING WITH DINNER 10/22/22   Guss Bunde, FNP-C          ROS:   All ten point review of systems was negative except what is in the HPI.          Physical:  Physical Exam  Constitutional:       Appearance: She is obese.   HENT:      Head: Normocephalic and atraumatic.      Nose: Nose normal.      Mouth/Throat:      Mouth: Mucous membranes are moist.   Eyes:      Extraocular Movements: Extraocular movements intact.      Pupils: Pupils are equal, round, and reactive to light.   Cardiovascular:      Rate and Rhythm: Normal rate and regular rhythm.      Pulses: Normal pulses.      Heart sounds: Normal heart sounds.   Pulmonary:      Effort: Pulmonary effort is normal.      Breath sounds: Normal breath sounds.  Abdominal:      General: Bowel sounds are normal.      Palpations: Abdomen is soft.   Musculoskeletal:      Cervical back: Normal range of motion and neck supple.      Right lower leg: Edema present.      Left lower leg: Edema present.   Skin:     General: Skin is warm and dry.      Capillary Refill: Capillary refill  takes less than 2 seconds.      Comments: Lesions to face, chronic   Neurological:      General: No focal deficit present.      Mental Status: She is alert and oriented to person, place, and time.   Psychiatric:         Mood and Affect: Mood normal.         Thought Content: Thought content normal.        Diagnostic studies:  No results found.       EKG interpretation:         Assessments:  Active Hospital Problems   (*Primary Problem)    Diagnosis    *Acute hip pain, left    Fall at home, sequela    Urinary tract infection    Acute left-sided low back pain without sciatica    Hypomagnesemia     Acute left hip pain/low back pain post fall at home, sequela  -tele/pulse ox  -imaging to left hip/pelvis, not done prior  -muscle relaxer and oral pain medications  -PT to evaluate, pt agreeable to rehab if recommended    Urinary tract infection  -ABX prophylactic-Levaquin  -pending urine culture    Hypomagnesemia  -Replenish when low  -daily mag level    Other  -Diabetic diet  -SSI with coverage  -DNR  -repeat labs      Plan:  Patient will be admitted for the above problems.      Code status: Prior  DVT prophylaxis: Xarelto    Diet: DIET DIABETIC Calorie amount: CC 2000; Do you want to initiate MNT Protocol? Yes    Disposition:  The patient is currently requiring treatment on the medical floor for problems as above. Patient will be closely evaluated and monitored and interventions adjusted based on clinical course.  Estimated length of stay > than 48 hr to obtain full medical treatment.      Judene Companion, FNP-BC    [facility]      This note was partially generated using MModal Fluency Direct system, and there may be some incorrect words, spellings, and punctuation that were not noted in checking the note before saving.

## 2023-01-05 NOTE — ED Nurses Note (Signed)
Oriented times four. Family at bedside. Respiratoins even and nonlabored with O2 @ 2L NC in place. Mediport site WNL. Telemetry in place. Transport here to take patient to 431B as planned. Patient and family agreeable. Patient left this unit via stretcher and in care of transport staff. Care relinquished at this time.

## 2023-01-05 NOTE — Nurses Notes (Signed)
Patient admitted to room 431B. Family at bedside. Patient on 2L oxygen via nasal cannula. Side rails up x2. Bed alarm armed for patient safety. Call bell placed within reach and instructions for use given.

## 2023-01-06 ENCOUNTER — Encounter (INDEPENDENT_AMBULATORY_CARE_PROVIDER_SITE_OTHER): Payer: Self-pay | Admitting: HEMATOLOGY-ONCOLOGY

## 2023-01-06 DIAGNOSIS — Z794 Long term (current) use of insulin: Secondary | ICD-10-CM

## 2023-01-06 LAB — COMPREHENSIVE METABOLIC PANEL, NON-FASTING
ALBUMIN/GLOBULIN RATIO: 1 (ref 0.8–1.4)
ALBUMIN: 2.5 g/dL — ABNORMAL LOW (ref 3.5–5.7)
ALKALINE PHOSPHATASE: 164 U/L — ABNORMAL HIGH (ref 34–104)
ALT (SGPT): 21 U/L (ref 7–52)
ANION GAP: 2 mmol/L — ABNORMAL LOW (ref 4–13)
AST (SGOT): 29 U/L (ref 13–39)
BILIRUBIN TOTAL: 0.7 mg/dL (ref 0.3–1.0)
BUN/CREA RATIO: 12 (ref 6–22)
BUN: 9 mg/dL (ref 7–25)
CALCIUM, CORRECTED: 10.5 mg/dL (ref 8.9–10.8)
CALCIUM: 9.3 mg/dL (ref 8.6–10.3)
CHLORIDE: 105 mmol/L (ref 98–107)
CO2 TOTAL: 31 mmol/L (ref 21–31)
CREATININE: 0.73 mg/dL (ref 0.60–1.30)
ESTIMATED GFR: 83 mL/min/{1.73_m2} (ref >59)
GLOBULIN: 2.6 — ABNORMAL LOW (ref 2.9–5.4)
GLUCOSE: 112 mg/dL — ABNORMAL HIGH (ref 74–109)
OSMOLALITY, CALCULATED: 275 mosm/kg (ref 270–290)
POTASSIUM: 3.9 mmol/L (ref 3.5–5.1)
PROTEIN TOTAL: 5.1 g/dL — ABNORMAL LOW (ref 6.4–8.9)
SODIUM: 138 mmol/L (ref 136–145)

## 2023-01-06 LAB — PHOSPHORUS: PHOSPHORUS: 3 mg/dL — ABNORMAL LOW (ref 3.7–7.2)

## 2023-01-06 LAB — CBC WITH DIFF
BASOPHIL #: 0 10*3/uL (ref 0.00–0.10)
BASOPHIL %: 1 % (ref 0–1)
EOSINOPHIL #: 0.1 10*3/uL (ref 0.00–0.50)
EOSINOPHIL %: 2 % (ref 1–7)
HCT: 25.2 % — ABNORMAL LOW (ref 31.2–41.9)
HGB: 8.4 g/dL — ABNORMAL LOW (ref 10.9–14.3)
LYMPHOCYTE #: 0.9 10*3/uL — ABNORMAL LOW (ref 1.00–3.00)
LYMPHOCYTE %: 28 % (ref 16–44)
MCH: 28.6 pg (ref 24.7–32.8)
MCHC: 33.4 g/dL (ref 32.3–35.6)
MCV: 85.6 fL (ref 75.5–95.3)
MONOCYTE #: 0.4 10*3/uL (ref 0.30–1.00)
MONOCYTE %: 12 % (ref 5–13)
MPV: 8.4 fL (ref 7.9–10.8)
NEUTROPHIL #: 1.8 10*3/uL — ABNORMAL LOW (ref 1.85–7.80)
NEUTROPHIL %: 57 % (ref 43–77)
PLATELETS: 141 10*3/uL (ref 140–440)
RBC: 2.94 10*6/uL — ABNORMAL LOW (ref 3.63–4.92)
RDW: 17.6 % (ref 12.3–17.7)
WBC: 3.2 10*3/uL — ABNORMAL LOW (ref 3.8–11.8)

## 2023-01-06 LAB — POC BLOOD GLUCOSE (RESULTS)
GLUCOSE, POC: 104 mg/dL — ABNORMAL HIGH (ref 70–100)
GLUCOSE, POC: 142 mg/dL — ABNORMAL HIGH (ref 70–100)
GLUCOSE, POC: 145 mg/dL — ABNORMAL HIGH (ref 70–100)
GLUCOSE, POC: 140 mg/dL — ABNORMAL HIGH (ref 70–100)

## 2023-01-06 LAB — MAGNESIUM: MAGNESIUM: 1.8 mg/dL — ABNORMAL LOW (ref 1.9–2.7)

## 2023-01-06 MED ORDER — MAGNESIUM SULFATE 1 GRAM/100 ML IN DEXTROSE 5 % INTRAVENOUS PIGGYBACK
1.0000 g | INJECTION | Freq: Once | INTRAVENOUS | Status: AC
Start: 2023-01-06 — End: 2023-01-06
  Administered 2023-01-06: 1 g via INTRAVENOUS
  Administered 2023-01-06: 0 g via INTRAVENOUS
  Filled 2023-01-06: qty 100

## 2023-01-06 MED ORDER — PANTOPRAZOLE 40 MG TABLET,DELAYED RELEASE
40.0000 mg | DELAYED_RELEASE_TABLET | Freq: Every day | ORAL | Status: DC
Start: 2023-01-06 — End: 2023-01-09
  Administered 2023-01-06 – 2023-01-09 (×4): 40 mg via ORAL
  Filled 2023-01-06 (×4): qty 1

## 2023-01-06 NOTE — Progress Notes (Signed)
[place] MEDICINE [facility]    HOSPITALIST PROGRESS NOTE    Kelsey Preston  Date of service: 01/06/2023  Date of Admission:  01/05/2023  Hospital Day:  LOS: 1 day     Subjective: Pt seen and examined. Kelsey Preston is being followed for acure left hip and left sided low back pain, sequaela, UTI, and hypomagnesemia. Kelsey Preston was able to walk well with PT and states Kelsey Preston is not in as much pain ambulating as Kelsey Preston has been. The muscle relaxer is helping her and nurse says Kelsey Preston is not taking the pain med, hydrocodone or tylenol. Left hip XR negative for subluxation or fracture. Prior surgery. Urine prelim showing >100,000 mixed flora. No pain in area of L1. Pain is lower. Kelsey Preston says Kelsey Preston has an existing L-spine compression fracture. Improving.    Vital Signs:  Filed Vitals:    01/06/23 0427 01/06/23 0724 01/06/23 1101 01/06/23 1136   BP: (!) 149/81 (!) 167/89  (!) 112/50   Pulse: 84 83 86 89   Resp: 17 17  18    Temp: 36.2 C (97.1 F) 36.2 C (97.1 F)  36.1 C (97 F)   SpO2: 100% 96%  92%        Physical Exam:  Constitutional:       Appearance: Kelsey Preston is obese.   HENT:      Head: Normocephalic and atraumatic.      Nose: Nose normal.      Mouth/Throat:      Mouth: Mucous membranes are moist.   Eyes:      Extraocular Movements: Extraocular movements intact.      Pupils: Pupils are equal, round, and reactive to light.   Cardiovascular:      Rate and Rhythm: Normal rate and regular rhythm.      Pulses: Normal pulses.      Heart sounds: Normal heart sounds.   Pulmonary:      Effort: Pulmonary effort is normal.      Breath sounds: Normal breath sounds.   Abdominal:      General: Bowel sounds are normal.      Palpations: Abdomen is soft.   Musculoskeletal:      Cervical back: Normal range of motion and neck supple.      Right lower leg: Edema present.      Left lower leg: Edema present.   Skin:     General: Skin is warm and dry.      Capillary Refill: Capillary refill takes less than 2 seconds.      Comments: Lesions to face, chronic    Neurological:      General: No focal deficit present.      Mental Status: Kelsey Preston is alert and oriented to person, place, and time.   Psychiatric:         Mood and Affect: Mood normal.         Thought Content: Thought content normal.   Intake & Output:    Intake/Output Summary (Last 24 hours) at 01/06/2023 1204  Last data filed at 01/06/2023 0938  Gross per 24 hour   Intake 980 ml   Output --   Net 980 ml     I/O current shift:  10/21 0700 - 10/21 1859  In: 340 [P.O.:240]  Out: -   Emesis:    BM:       Heme:      acetaminophen (TYLENOL) tablet, 650 mg, Oral, Q6H PRN  alcohol 62 % (NOZIN NASAL SANITIZER) nasal solution, 1 Each,  Each Nostril, 2x/day  Correction/SSIP insulin lispro (HumaLOG) 100 units/mL injection, 3-14 Units, Subcutaneous, 4x/day AC  dextrose (GLUTOSE) 40% oral gel, 15 g, Oral, Q15 Min PRN  dextrose 50% (0.5 g/mL) injection - syringe, 12.5 g, Intravenous, Q15 Min PRN  glucagon (GLUCAGEN DIAGNOSTIC KIT) injection 1 mg, 1 mg, IntraMUSCULAR, Once PRN  HYDROcodone-acetaminophen (NORCO) 5-325 mg per tablet, 1 Tablet, Oral, Q6H PRN  levoFLOXacin (LEVAQUIN) tablet, 500 mg, Oral, Q24H  methocarbamol (ROBAXIN) tablet, 1,000 mg, Oral, 4x/day PRN  NS flush syringe, 10-20 mL, Intravenous, Q8HRS  NS flush syringe, 20-30 mL, Intravenous, Q1 MIN PRN  ondansetron (ZOFRAN) 2 mg/mL injection, 4 mg, Intravenous, Q8H PRN  pantoprazole (PROTONIX) delayed release tablet, 40 mg, Oral, Daily  ramelteon (ROZEREM) tablet, 8 mg, Oral, HS PRN  rivaroxaban (XARELTO) tablet, 20 mg, Oral, Daily with Dinner          Labs:  Recent Results (from the past 48 hour(s))   CBC WITH DIFF    Collection Time: 01/06/23  6:02 AM   Result Value    WBC 3.2 (L)    HGB 8.4 (L)    HCT 25.2 (L)    PLATELETS 141      No results found for this or any previous visit (from the past 48 hour(s)).   Recent Results (from the past 48 hour(s))   COMPREHENSIVE METABOLIC PANEL, NON-FASTING    Collection Time: 01/06/23  6:02 AM   Result Value    ALKALINE  PHOSPHATASE 164 (H)    ALT (SGPT) 21    AST (SGOT) 29   COMPREHENSIVE METABOLIC PANEL, NON-FASTING    Collection Time: 01/05/23  9:58 AM   Result Value    ALKALINE PHOSPHATASE 177 (H)    ALT (SGPT) 24    AST (SGOT) 33      No results found for this or any previous visit (from the past 48 hour(s)).   No results found for this or any previous visit (from the past 48 hour(s)).   No results found for this or any previous visit (from the past 1344 hour(s)).   No results found for this or any previous visit (from the past 48 hour(s)).     Microbiology:  Hospital Encounter on 01/05/23 (from the past 96 hour(s))   URINE CULTURE,ROUTINE    Collection Time: 01/05/23  9:57 AM    Specimen: Urine, Clean Catch   Culture Result Status    URINE CULTURE >100,000 CFU/mL Mixed Gram Negative Flora (A) Preliminary    Narrative    CONTINUING TO HOLD     ADULT ROUTINE BLOOD CULTURE, SET OF 2 ADULT BOTTLES (BACTERIA AND YEAST)    Collection Time: 01/05/23  9:58 AM    Specimen: Blood   Culture Result Status    BLOOD CULTURE, ROUTINE No Growth 18-24 hrs. Preliminary   ADULT ROUTINE BLOOD CULTURE, SET OF 2 ADULT BOTTLES (BACTERIA AND YEAST)    Collection Time: 01/05/23  9:59 AM    Specimen: Blood   Culture Result Status    BLOOD CULTURE, ROUTINE No Growth 18-24 hrs. Preliminary       Imaging:   XR HIP LEFT W PELVIS 2-3 VIEWS  Narrative: ZOXWR Bale    RADIOLOGIST: Marcelyn Ditty, MD    XR HIP LEFT W PELVIS 2-3 VIEWS performed on 01/05/2023 2:58 PM    CLINICAL HISTORY: fall, persistent left hip pain.  LT hip pain after fall    TECHNIQUE:  3 views of the left hip including AP pelvis.  COMPARISON:    01/05/2023 CT abdomen or pelvis  2021 left hip    FINDINGS:   No acute fracture or dislocation  Post fixation left intertrochanteric fracture 2021  Bones are diffusely osteopenic  Mild degenerative changes bilateral hips  Impression: Chronic changes. No acute findings         Radiologist location ID: [id number]  CT ABDOMEN PELVIS W IV  CONTRAST  Narrative: Kamerin Kross    RADIOLOGIST: Kelby Frame    CT ABDOMEN PELVIS W IV CONTRAST performed on 01/05/2023 12:02 PM    CLINICAL HISTORY: Left flank pain, worsening..  Left flank pain, worsening., hx left hip replacement, endometrial cancer, breast cancer, cholecystectomy, hysterectomy    TECHNIQUE:  Abdomen and pelvis CT with intravenous contrast.  IV CONTRAST: 75 ml's of Omnipaque 350    COMPARISON:  None.    FINDINGS:  Lung bases: Atelectatic densities are present. There is hiatal hernia present coronary calcifications are seen. No acute pleural disease    Liver:   Unremarkable.    Gallbladder:   Gallbladder surgically absent. No definite common duct dilatation or stone    Spleen:   Maximal length is around 13 cm which is upper normal    Pancreas:   Unremarkable.    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 There is an associated calcification. Similar to the prior exam at around 2.1 cm.    Kidneys:   Low density renal cysts. Both kidneys are enhancing. No definite new perinephric findings. No obstructing stones or hydronephrosis detected.    Bladder:  Unremarkable.    Uterus and Adnexa:  Prior hysterectomy.  Adnexal regions are unremarkable.    Bowel:   Liquid stool mixing with solid in the colon. Some mildly distended fluid-filled small bowel loops are present. This pattern could represent enteritis in the appropriate clinical setting. Difficult to exclude wall thickening at the gastric antrum, suspicious for inflammatory disease. Endoscopy could be considered if not recently done. There is some questionable peripheral lucency at the distal aspect of the antrum concerning for possible ulcer formation in this area. There is no perigastric fluid or gas to indicate perforated ulceration.    Appendix:  No appendicitis pattern    Lymph nodes:  No suspicious lymph node enlargement.    Vasculature:   Calcified wall plaque at the aorta and major branches similar to the old exam. No  aneurysm     Peritoneum / Retroperitoneum: No ascites.  No free air.    Bones:   Slight increase in inferior endplate compression deformity at L1 compared to the recent CT scan, correlate for any interval trauma. This is concerning for possible subtle compression fracture.  Impression: The bowel gas pattern is suspicious for possible enteritis which could be infectious  Gastritis with questionable gastric antral ulceration  Compression deformity of L1 not seen previously could indicate interval developing compression fracture. Further investigation with bone scan or MRI may be useful. (No bone retropulsion)  Nonspecific subcutaneous left mid and lower paraspinal haziness may represent edema or bruising. No large focal hematomas are seen     One or more dose reduction techniques were used (e.g., Automated exposure control, adjustment of the mA and/or kV according to patient size, use of iterative reconstruction technique).    Radiologist location ID: [id number]      Assessment/ Plan:   Active Hospital Problems   (*Primary Problem)    Diagnosis    *Acute hip pain, left    Fall at home,  sequela    Urinary tract infection    Acute left-sided low back pain without sciatica    Abnormal urinalysis    Gastritis, presence of bleeding unspecified, unspecified chronicity, unspecified gastritis type    Hypomagnesemia     Acute left hip pain/low back pain post fall at home, sequela  -tele/pulse ox  -imaging to left hip/pelvis, not done prior  -muscle relaxer and oral pain medications  -PT to evaluate, pt agreeable to rehab if recommended     Urinary tract infection  -ABX prophylactic-Levaquin  -pending urine culture     Hypomagnesemia  -Replenish when low  -daily mag level     Other  -Diabetic diet  -SSI with coverage  -DNR    01/06/23- Mg+ low and replenished. Urine culture final pending. Working with PT and feeling better, less pain. Will need OP GI follow up for gastric antral ulceration.    Nutrition:   DIET DIABETIC  Calorie amount: CC 2000; Do you want to initiate MNT Protocol? Yes             Additional clinical characteristics related to nutrition:    - monitor for weight changes   - monitor intake and output    - monitor bowel functions        Lab Results   Component Value Date    ALBUMIN 2.5 (L) 01/06/2023         Disposition Planning:   home  The Hospitalist personally evaluated and examined the patient in conjunction with the MLP and agree with the assessments, treatment plan and disposition of the patient as recorded by the [facility].   Judene Companion, FNP-BC    [facility] MEDICINE HOSPITALIST

## 2023-01-06 NOTE — PT Evaluation (Signed)
[facility] Medicine [facility]  [address]  [place], [address]  [phone]  (Fax) [phone]  Rehabilitation Services  Physical Therapy Inpatient Initial Evaluation    Patient Name: Kelsey Preston  Date of Birth: 11-23-1940  Height: Height: 162.6 cm (5\' 4" )  Weight: Weight: 92.6 kg (204 lb 2 oz)  Room/Bed: 431/B  Payor: MEDICARE / Plan: MEDICARE PART A AND B / Product Type: Medicare /       PMH:  Past Medical History:   Diagnosis Date    Diabetes mellitus, type 2 (CMS HCC)     Embolism (CMS HCC)     left leg    Endometrial cancer (CMS HCC)     HTN (hypertension)     Hx of breast cancer     Lung cancer (CMS HCC)     Macular degeneration (senile) of retina            Assessment:      (P) Patient is an 82 year old female admitted 10/20 due to acute left hip pain. Patient was agreeable to PT evaluation upon arrival this date. Patient completed supine <> sitting EOB requiring minimal assistance and STS using FWW requiring CGA. Patient ambulated 100' using FWW requiring CGA. Patient is a good candidate for skilled PT intervention during hospital admission to improve strength, endurance, balance, and independence with functional mobility.    Discharge Needs:    Equipment Recommendation: (P) none anticipated      The patient presents with mobility limitations due to impaired balance, impaired strength, and impaired functional activity tolerance that significantly impair/prevent patient's ability to participate in mobility-related activities of daily living (MRADLs) including  ambulation and transfers in order to safely complete, toileting, bathing, food preparation, laundering/household tasks, safely entering/exiting the home, in reasonable time. This functional mobility deficit can be sufficiently resolved with the use of a (P) none anticipated  in order to decrease the risk of falls, morbidity, and mortality in performance of these MRADLs.  Patient is able to safely use this assistive  device.    Discharge Disposition: (P) home with assist, home with home health    JUSTIFICATION OF DISCHARGE RECOMMENDATION   Based on current diagnosis, functional performance prior to admission, and current functional performance, this patient requires continued PT services in (P) home with assist, home with home health in order to achieve significant functional improvements in these deficit areas: (P) aerobic capacity/endurance, gait, locomotion, and balance, posture, muscle performance, ROM (range of motion).        Plan:   Current Intervention: (P) balance training, bed mobility training, gait training, home exercise program, patient/family education, postural re-education, ROM (range of motion), stair training, strengthening, stretching, transfer training  To provide physical therapy services (P) other (see comments) (1-2x/day at least one day weekly.)  for duration of (P) until discharge.    The risks/benefits of therapy have been discussed with the patient/caregiver and he/she is in agreement with the established plan of care.       Subjective & Objective     Past Medical History:   Diagnosis Date    Diabetes mellitus, type 2 (CMS HCC)     Embolism (CMS HCC)     left leg    Endometrial cancer (CMS HCC)     HTN (hypertension)     Hx of breast cancer     Lung cancer (CMS HCC)     Macular degeneration (senile) of retina  Past Surgical History:   Procedure Laterality Date    HIP SURGERY Left     HX CHOLECYSTECTOMY      HX COLONOSCOPY      HX HYSTERECTOMY      HX LOBECTOMY Left     LUNG CANCER SURGERY      PORTACATH PLACEMENT                   01/06/23 0947   Rehab Session   Document Type evaluation   PT Visit Date 01/06/23   General Information   Patient Profile Reviewed yes   Pertinent History of Current Functional Problem Patient is an 82 year old female admitted 10/20 after a fall at home on Tuesday last week. Patient has a PMH of DM type 2, embolism, endometrial cancer, HTN, breast cancer, lung  cancer, and senile macular degeneration. PT was consulted for evaluation.   Medical Lines Telemetry;PIV Line;Flexi-Seal   Respiratory Status room air   Existing Precautions/Restrictions no known precautions/limitations   Mutuality/Individual Preferences   Anxieties, Fears or Concerns none voiced   Individualized Care Needs assistance with ADLs, monitoring of vitals   Patient-Specific Goals (Include Timeframe) D/C when able   Plan of Care Reviewed With patient   Living Environment   Lives With spouse   Living Arrangements house   Home Assessment: No Problems Identified   Home Accessibility stairs to enter home   Home Main Entrance   Stair Railings, Main Entrance railings on both sides of stairs   Number of Stairs, Main Entrance three   Functional Level Prior   Ambulation 1 - assistive equipment   Transferring 1 - assistive equipment   Toileting 0 - independent   Bathing 1 - assistive equipment   Dressing 2 - assistive person   Eating 0 - independent   Communication 0 - understands/communicates without difficulty   Swallowing 0-->swallows foods/liquids without difficulty   Prior Functional Level Comment Patient reports using FWW for ambulation prior to admission, using shower chair, and requiring assistance from family for getting dressed. Patient reports independence with all other functional ADLs.   Pre Treatment Status   Pre Treatment Patient Status Patient supine in bed   Support Present Pre Treatment  Nurse present   Communication Pre Treatment  Charge Nurse   Communication Pre Treatment Comment CN cleared pt for PT eval.   Cognitive Assessment/Interventions   Behavior/Mood Observations behavior appropriate to situation, WNL/WFL   Orientation Status oriented x 4   Attention WNL/WFL   Follows Commands WFL   Pre- Treatment Vital Signs   Pre-Treatment Heart Rate (beats/min) 88   Pre SpO2 (%) 94   O2 Delivery Pre Treatment room air   Pre-Treatment Pain   Pretreatment Pain Rating 0/10 - no pain   RUE Assessment   RUE  Assessment WFL- Within Functional Limits   LUE Assessment   LUE Assessment WFL- Within Functional Limits   RLE Assessment   RLE Assessment X-Exceptions   RLE Strength grossly: 3+/5.   LLE Assessment   LLE Assessment X-Exceptions   LLE Strength grossly: 3-/5.   Trunk Assessment   Trunk Assessment WFL-Within Functional Limits   Mobility Assessment/Training   Additional Documentation Transfer Assessment/Treatment (Group);Bed Mobility Assessment/Treatment (Group);Gait Assessment/Treatment (Group)   Bed Mobility Assessment/Treatment   Bed Mobility, Assistive Device bed rails;Head of Bed Elevated   Scoot/Bridge Independence stand-by assistance   Sit to Supine, Independence minimum assist (75% patient effort)   Supine-Sit Independence minimum assist (75% patient effort)   Transfer  Assessment/Treatment   Sit-Stand-Sit, Assist Device walker, front wheeled   Sit-Stand Independence contact guard assist   Stand-Sit Independence contact guard assist   Gait Assessment/Treatment   Total Distance Ambulated 100   Gait Speed decreased   Deviations  narrow BOS;step length decreased;stride length decreased   Assistive Device  walker, front wheeled   Distance in Feet 100   Independence  contact guard assist   Post Treatment Status   Post Treatment Patient Status Patient supine in bed   Support Present Post Treatment  None   Communication Post Treatement Charge Nurse   Patient Effort excellent   Post-Treatment Vital Signs   Post-treatment Heart Rate (beats/min) 94   Post SpO2 (%) 91   O2 Delivery Post Treatment room air   Post-Treatment Pain   Posttreatment Pain Rating 0/10 - no pain   Physical Therapy Clinical Impression   Assessment Patient is an 82 year old female admitted 10/20 due to acute left hip pain. Patient was agreeable to PT evaluation upon arrival this date. Patient completed supine <> sitting EOB requiring minimal assistance and STS using FWW requiring CGA. Patient ambulated 100' using FWW requiring CGA. Patient is a good  candidate for skilled PT intervention during hospital admission to improve strength, endurance, balance, and independence with functional mobility.   Criteria for Skilled Therapeutic yes   Impairments Found (describe specific impairments) aerobic capacity/endurance;gait, locomotion, and balance;posture;muscle performance;ROM (range of motion)   Functional Limitations in Following  home management;self-care;community/leisure   Rehab Potential good   Therapy Frequency other (see comments)  (1-2x/day at least one day weekly.)   Predicted Duration of Therapy Intervention (days/wks) until discharge   Anticipated Equipment Needs at Discharge (PT) none anticipated   Anticipated Discharge Disposition home with assist;home with home health   Evaluation Complexity Justification   Patient History: Co-morbidity/factors that impact Plan of Care 3 or more that impact Plan of Care   Examination Components 3 or more Exam elements addressed   Presentation Evolving: Symptoms, complaints, characteristics of condition changing &/or cognitive deficits present   Clinical Decision Making Moderate complexity   Evaluation Complexity Moderate complexity   Care Plan Goals   PT Rehab Goals Bed Mobility Goal;Gait Training Goal;Transfer Training Goal;Stairs Training Goal   Planned Therapy Interventions, PT Eval   Planned Therapy Interventions (PT) balance training;bed mobility training;gait training;home exercise program;patient/family education;postural re-education;ROM (range of motion);stair training;strengthening;stretching;transfer training   Transfer Training Goal   Transfer Training Goal, Date Established 01/06/23   Transfer Training Goal, Time to Achieve by discharge   Transfer Training Goal, Activity Type all transfers   Transfer Training Goal, Current Status contact guard assist   Transfer Training Goal, Independence Level modified independence   Transfer Training Goal, Assist Device least restrictrictive assistive device   Gait  Training  Goal, Distance to Achieve   Gait Training  Goal, Date Established 01/06/23   Gait Training  Goal, Time to Achieve by discharge   Gait Training  Goal, Independence Level stand-by assistance   Gait Training  Goal, Assist Device least restricted assistive device   Gait Training  Goal, Distance to Achieve 200   Bed Mobility Goal   Bed Mobility Goal, Date Established 01/06/23   Bed Mobility Goal, Time to Achieve by discharge   Bed Mobility Goal, Activity Type all bed mobility activities   Bed Mobility Goal, Current Status minimum assist (75% patient effort)   Bed Mobility Goal, Independence Level modified independence   Bed Mobility Goal, Assistive Device least restrictive assistive device  Stairs Training Goal   Stairs Training Goal, Date Established 01/06/23   Stairs Training Goal, Time to Achieve by discharge   Stairs Training Goal, Independence Level contact guard assist   Stairs Training Goal, Current Status other (see comments)  (not assessed)   Stairs Training Goal, Assist Device least restrictive assistive device   Stairs Training Goal, Number of Stairs to Achieve 3  (B HR)   Physical Therapy Time and Intention   Total PT Minutes: 20       INTERVENTION MINUTES: EVALUATION 20 minutes    EVALUATION COMPLEXITY : CLINICAL DECISION MAKING OF MODERATE COMPLEXITY AS INDICATED BY PMHX, PHYSICAL THERAPY ASSESSMENT OF MUSCULOSKELETAL AND NEUROLOGICAL SYSTEMS AND ACTIVITY LIMITATIONS. CLINICAL PRESENTATION HAS CHANGING CHARACTERISTICS.    Therapist:     Phineas Douglas, PT  01/06/2023, 12:38

## 2023-01-06 NOTE — Care Plan (Signed)
Medical Nutrition Therapy Assessment    Reason for assessment: decreased intake    SUBJECTIVE : Patient had last chemotherapy 3 weeks ago.  Treatment side effects included decreased appetite, change in taste perception, and occasional nausea.  She denies nausea today.  Most foods continue to not taste appealing.  Weight is similar to earlier this year but she's gradually lost from a usual weight of 260 lbs several years ago.  She denies difficulty chewing or swallowing.  She limits acidic foods 2' ulcer.  She has difficulty tolerating the taste of shake type supplements but is interested in trying Ensure Clear.    OBJECTIVE:   PMH includes:  DM, breast cancer, hx lung and endometrial cancer  Current Diet Order/Nutrition Support:  DIET DIABETIC Calorie amount: CC 2000; Do you want to initiate MNT Protocol? Yes    Height Used for Calculations: 162.6 cm (5\' 4" )  Weight Used For Calculations: 92.6 kg (204 lb 2.3 oz)  BMI (kg/m2): 35.11  Ideal Body Weight (IBW) (kg): 55  % Ideal Body Weight: 168.35    Estimated Needs:  Energy Calorie Requirements: 1636 kcal/day (30 kcal/kg IBW)  Protein Requirements (gms/day): 65 gm/day    Comments: Admission problems includes UTI and acute hip pan s/p fall.  Meal intakes 100% today.  Gluc 112.    Plan/Interventions : Try Ensure Clear.  Monitor glucose particularly due to supplement's carbohydrates.  Encourage and monitor PO intake.    Goal: No unintentional weight loss.    Nutrition Diagnosis: Inadequate energy intake related to  side effects of cancer and treatment  as evidenced by Unintentional weight loss , Decreased oral intake     Nadyne Coombes, RDLD

## 2023-01-06 NOTE — Care Plan (Signed)
Problem: Adult Inpatient Plan of Care  Goal: Plan of Care Review  Outcome: Ongoing (see interventions/notes)  Goal: Patient-Specific Goal (Individualized)  Outcome: Ongoing (see interventions/notes)  Goal: Absence of Hospital-Acquired Illness or Injury  Outcome: Ongoing (see interventions/notes)  Goal: Optimal Comfort and Wellbeing  Outcome: Ongoing (see interventions/notes)  Goal: Rounds/Family Conference  Outcome: Ongoing (see interventions/notes)

## 2023-01-06 NOTE — Care Plan (Signed)
Problem: Adult Inpatient Plan of Care  Goal: Plan of Care Review  Outcome: Ongoing (see interventions/notes)  Goal: Patient-Specific Goal (Individualized)  Outcome: Ongoing (see interventions/notes)  Goal: Absence of Hospital-Acquired Illness or Injury  Outcome: Ongoing (see interventions/notes)  Goal: Optimal Comfort and Wellbeing  Outcome: Ongoing (see interventions/notes)  Goal: Rounds/Family Conference  Outcome: Ongoing (see interventions/notes)     Problem: Health Knowledge, Opportunity to Enhance (Adult,Obstetrics,Pediatric)  Goal: Knowledgeable about Health Subject/Topic  Description: Patient will demonstrate the desired outcomes by discharge/transition of care.  Outcome: Ongoing (see interventions/notes)     Problem: Fall Injury Risk  Goal: Absence of Fall and Fall-Related Injury  Outcome: Ongoing (see interventions/notes)     Problem: Depression  Goal: Improved Mood  Outcome: Ongoing (see interventions/notes)

## 2023-01-07 DIAGNOSIS — M25552 Pain in left hip: Secondary | ICD-10-CM

## 2023-01-07 DIAGNOSIS — M545 Low back pain, unspecified: Secondary | ICD-10-CM

## 2023-01-07 DIAGNOSIS — E119 Type 2 diabetes mellitus without complications: Secondary | ICD-10-CM

## 2023-01-07 DIAGNOSIS — N390 Urinary tract infection, site not specified: Principal | ICD-10-CM

## 2023-01-07 DIAGNOSIS — Z66 Do not resuscitate: Secondary | ICD-10-CM

## 2023-01-07 DIAGNOSIS — Z9181 History of falling: Secondary | ICD-10-CM

## 2023-01-07 LAB — MAGNESIUM: MAGNESIUM: 1.5 mg/dL — ABNORMAL LOW (ref 1.9–2.7)

## 2023-01-07 LAB — BASIC METABOLIC PANEL
ANION GAP: 7 mmol/L (ref 4–13)
BUN/CREA RATIO: 10 (ref 6–22)
BUN: 8 mg/dL (ref 7–25)
CALCIUM: 9.6 mg/dL (ref 8.6–10.3)
CHLORIDE: 104 mmol/L (ref 98–107)
CO2 TOTAL: 30 mmol/L (ref 21–31)
CREATININE: 0.78 mg/dL (ref 0.60–1.30)
ESTIMATED GFR: 77 mL/min/{1.73_m2} (ref >59)
GLUCOSE: 123 mg/dL — ABNORMAL HIGH (ref 74–109)
OSMOLALITY, CALCULATED: 281 mosm/kg (ref 270–290)
POTASSIUM: 3.7 mmol/L (ref 3.5–5.1)
SODIUM: 141 mmol/L (ref 136–145)

## 2023-01-07 LAB — CBC
HCT: 25.6 % — ABNORMAL LOW (ref 31.2–41.9)
HGB: 8.6 g/dL — ABNORMAL LOW (ref 10.9–14.3)
MCH: 28.7 pg (ref 24.7–32.8)
MCHC: 33.4 g/dL (ref 32.3–35.6)
MCV: 86 fL (ref 75.5–95.3)
MPV: 8.6 fL (ref 7.9–10.8)
PLATELETS: 144 10*3/uL (ref 140–440)
RBC: 2.98 10*6/uL — ABNORMAL LOW (ref 3.63–4.92)
RDW: 18.2 % — ABNORMAL HIGH (ref 12.3–17.7)
WBC: 2.8 10*3/uL — ABNORMAL LOW (ref 3.8–11.8)

## 2023-01-07 LAB — POC BLOOD GLUCOSE (RESULTS)
GLUCOSE, POC: 114 mg/dL — ABNORMAL HIGH (ref 70–100)
GLUCOSE, POC: 181 mg/dL — ABNORMAL HIGH (ref 70–100)
GLUCOSE, POC: 193 mg/dL — ABNORMAL HIGH (ref 70–100)

## 2023-01-07 MED ORDER — MAGNESIUM SULFATE 1 GRAM/100 ML IN DEXTROSE 5 % INTRAVENOUS PIGGYBACK
1.0000 g | INJECTION | INTRAVENOUS | Status: AC
Start: 2023-01-07 — End: 2023-01-07
  Administered 2023-01-07: 1 g via INTRAVENOUS
  Administered 2023-01-07: 0 g via INTRAVENOUS
  Administered 2023-01-07: 1 g via INTRAVENOUS
  Administered 2023-01-07: 0 g via INTRAVENOUS
  Filled 2023-01-07 (×2): qty 100

## 2023-01-07 MED ORDER — MAGNESIUM SULFATE 1 GRAM/100 ML IN DEXTROSE 5 % INTRAVENOUS PIGGYBACK
1.0000 g | INJECTION | INTRAVENOUS | Status: AC
Start: 2023-01-07 — End: 2023-01-07
  Filled 2023-01-07 (×3): qty 100

## 2023-01-07 MED ORDER — HYDRALAZINE 20 MG/ML INJECTION SOLUTION
10.0000 mg | INTRAMUSCULAR | Status: AC
Start: 2023-01-07 — End: 2023-01-07
  Administered 2023-01-07: 10 mg via INTRAVENOUS
  Filled 2023-01-07: qty 1

## 2023-01-07 MED ORDER — LOSARTAN 50 MG TABLET
100.0000 mg | ORAL_TABLET | Freq: Every day | ORAL | Status: DC
Start: 2023-01-08 — End: 2023-01-09
  Administered 2023-01-08 – 2023-01-09 (×2): 100 mg via ORAL
  Filled 2023-01-07 (×2): qty 2

## 2023-01-07 MED ORDER — HYDRALAZINE 20 MG/ML INJECTION SOLUTION
5.0000 mg | INTRAMUSCULAR | Status: AC
Start: 2023-01-07 — End: 2023-01-07
  Administered 2023-01-07: 5 mg via INTRAVENOUS
  Filled 2023-01-07: qty 1

## 2023-01-07 NOTE — Progress Notes (Signed)
[place] MEDICINE [facility]    HOSPITALIST PROGRESS NOTE    Kelsey Preston  Date of service: 01/07/2023  Date of Admission:  01/05/2023  Hospital Day:  LOS: 2 days     Subjective: Pt seen and examined. She is being followed for acure left hip and left sided low back pain, sequaela, UTI, and hypomagnesemia. She said she was only out of bed once yesterday and her back began to hurt. She thinks she needs to be up more. I spoke with charge nurse and her nurse and asked after PT to get her up to bedside chair. Muscle relaxer is helping. Hip/pelvis XR negative for acute fracture. She is scheduled for OP PET scan on 10/24.     Vital Signs:  Filed Vitals:    01/07/23 0237 01/07/23 0337 01/07/23 0808 01/07/23 0916   BP: (!) 194/102 127/62 (!) 148/89    Pulse: 95 92 88    Resp:  16 16    Temp:  36.2 C (97.1 F) (!) 35.7 C (96.3 F)    SpO2:  98% 92% 93%        Physical Exam:  Constitutional:       Appearance: She is obese.   HENT:      Head: Normocephalic and atraumatic.      Nose: Nose normal.      Mouth/Throat:      Mouth: Mucous membranes are moist.   Eyes:      Extraocular Movements: Extraocular movements intact.      Pupils: Pupils are equal, round, and reactive to light.   Cardiovascular:      Rate and Rhythm: Normal rate and regular rhythm.      Pulses: Normal pulses.      Heart sounds: Normal heart sounds.   Pulmonary:      Effort: Pulmonary effort is normal.      Breath sounds: Normal breath sounds.   Abdominal:      General: Bowel sounds are normal.      Palpations: Abdomen is soft.   Musculoskeletal:      Cervical back: Normal range of motion and neck supple.      Right lower MVH:QION edema present.      Left lower leg: Mild edema present.   Skin:     General: Skin is warm and dry.      Capillary Refill: Capillary refill takes less than 2 seconds.      Comments: Lesions to face, chronic   Neurological:      General: No focal deficit present.      Mental Status: She is alert and oriented to person,  place, and time.   Psychiatric:         Mood and Affect: Mood normal.         Thought Content: Thought content normal.   Intake & Output:    Intake/Output Summary (Last 24 hours) at 01/07/2023 1100  Last data filed at 01/07/2023 0549  Gross per 24 hour   Intake 920 ml   Output 2300 ml   Net -1380 ml     I/O current shift:  No intake/output data recorded.  Emesis:    BM:    Date of Last Bowel Movement: 01/02/23  Heme:      acetaminophen (TYLENOL) tablet, 650 mg, Oral, Q6H PRN  alcohol 62 % (NOZIN NASAL SANITIZER) nasal solution, 1 Each, Each Nostril, 2x/day  Correction/SSIP insulin lispro (HumaLOG) 100 units/mL injection, 3-14 Units, Subcutaneous, 4x/day AC  dextrose (GLUTOSE)  40% oral gel, 15 g, Oral, Q15 Min PRN  dextrose 50% (0.5 g/mL) injection - syringe, 12.5 g, Intravenous, Q15 Min PRN  glucagon (GLUCAGEN DIAGNOSTIC KIT) injection 1 mg, 1 mg, IntraMUSCULAR, Once PRN  HYDROcodone-acetaminophen (NORCO) 5-325 mg per tablet, 1 Tablet, Oral, Q6H PRN  levoFLOXacin (LEVAQUIN) tablet, 500 mg, Oral, Q24H  magnesium sulfate 1 G in D5W 100 mL premix IVPB, 1 g, Intravenous, Q1H  methocarbamol (ROBAXIN) tablet, 1,000 mg, Oral, 4x/day PRN  NS flush syringe, 10-20 mL, Intravenous, Q8HRS  NS flush syringe, 20-30 mL, Intravenous, Q1 MIN PRN  ondansetron (ZOFRAN) 2 mg/mL injection, 4 mg, Intravenous, Q8H PRN  pantoprazole (PROTONIX) delayed release tablet, 40 mg, Oral, Daily  ramelteon (ROZEREM) tablet, 8 mg, Oral, HS PRN  rivaroxaban (XARELTO) tablet, 20 mg, Oral, Daily with Dinner          Labs:  Recent Results (from the past 48 hour(s))   CBC WITH DIFF    Collection Time: 01/06/23  6:02 AM   Result Value    WBC 3.2 (L)    HGB 8.4 (L)    HCT 25.2 (L)    PLATELETS 141      Results for orders placed or performed during the hospital encounter of 01/05/23 (from the past 48 hour(s))   BASIC METABOLIC PANEL    Collection Time: 01/07/23  3:54 AM   Result Value    SODIUM 141    POTASSIUM 3.7    CHLORIDE 104    CO2 TOTAL 30    GLUCOSE  123 (H)    BUN 8    CREATININE 0.78      Recent Results (from the past 48 hour(s))   COMPREHENSIVE METABOLIC PANEL, NON-FASTING    Collection Time: 01/06/23  6:02 AM   Result Value    ALKALINE PHOSPHATASE 164 (H)    ALT (SGPT) 21    AST (SGOT) 29      No results found for this or any previous visit (from the past 48 hour(s)).   No results found for this or any previous visit (from the past 48 hour(s)).   No results found for this or any previous visit (from the past 1344 hour(s)).   No results found for this or any previous visit (from the past 48 hour(s)).     Microbiology:  Hospital Encounter on 01/05/23 (from the past 96 hour(s))   URINE CULTURE,ROUTINE    Collection Time: 01/05/23  9:57 AM    Specimen: Urine, Clean Catch   Culture Result Status    URINE CULTURE (A) Preliminary     >100,000 CFU/mL Lactose Fermenting Gram Negative Rods    URINE CULTURE Gram Negative Rods (A) Preliminary    Narrative    SENSITIVITY (MIC) TO FOLLOW       ADULT ROUTINE BLOOD CULTURE, SET OF 2 ADULT BOTTLES (BACTERIA AND YEAST)    Collection Time: 01/05/23  9:58 AM    Specimen: Blood   Culture Result Status    BLOOD CULTURE, ROUTINE No Growth 2 Days Preliminary   ADULT ROUTINE BLOOD CULTURE, SET OF 2 ADULT BOTTLES (BACTERIA AND YEAST)    Collection Time: 01/05/23  9:59 AM    Specimen: Blood   Culture Result Status    BLOOD CULTURE, ROUTINE No Growth 2 Days Preliminary       Imaging:   XR HIP LEFT W PELVIS 2-3 VIEWS  Narrative: GUYQI Szczerba    RADIOLOGIST: Marcelyn Ditty, MD    XR HIP LEFT W PELVIS 2-3  VIEWS performed on 01/05/2023 2:58 PM    CLINICAL HISTORY: fall, persistent left hip pain.  LT hip pain after fall    TECHNIQUE:  3 views of the left hip including AP pelvis.    COMPARISON:    01/05/2023 CT abdomen or pelvis  2021 left hip    FINDINGS:   No acute fracture or dislocation  Post fixation left intertrochanteric fracture 2021  Bones are diffusely osteopenic  Mild degenerative changes bilateral hips  Impression: Chronic  changes. No acute findings         Radiologist location ID: [id number]  CT ABDOMEN PELVIS W IV CONTRAST  Narrative: Chazlyn Loeffler    RADIOLOGIST: Kelby Frame    CT ABDOMEN PELVIS W IV CONTRAST performed on 01/05/2023 12:02 PM    CLINICAL HISTORY: Left flank pain, worsening..  Left flank pain, worsening., hx left hip replacement, endometrial cancer, breast cancer, cholecystectomy, hysterectomy    TECHNIQUE:  Abdomen and pelvis CT with intravenous contrast.  IV CONTRAST: 75 ml's of Omnipaque 350    COMPARISON:  None.    FINDINGS:  Lung bases: Atelectatic densities are present. There is hiatal hernia present coronary calcifications are seen. No acute pleural disease    Liver:   Unremarkable.    Gallbladder:   Gallbladder surgically absent. No definite common duct dilatation or stone    Spleen:   Maximal length is around 13 cm which is upper normal    Pancreas:   Unremarkable.    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 There is an associated calcification. Similar to the prior exam at around 2.1 cm.    Kidneys:   Low density renal cysts. Both kidneys are enhancing. No definite new perinephric findings. No obstructing stones or hydronephrosis detected.    Bladder:  Unremarkable.    Uterus and Adnexa:  Prior hysterectomy.  Adnexal regions are unremarkable.    Bowel:   Liquid stool mixing with solid in the colon. Some mildly distended fluid-filled small bowel loops are present. This pattern could represent enteritis in the appropriate clinical setting. Difficult to exclude wall thickening at the gastric antrum, suspicious for inflammatory disease. Endoscopy could be considered if not recently done. There is some questionable peripheral lucency at the distal aspect of the antrum concerning for possible ulcer formation in this area. There is no perigastric fluid or gas to indicate perforated ulceration.    Appendix:  No appendicitis pattern    Lymph nodes:  No suspicious lymph node  enlargement.    Vasculature:   Calcified wall plaque at the aorta and major branches similar to the old exam. No aneurysm     Peritoneum / Retroperitoneum: No ascites.  No free air.    Bones:   Slight increase in inferior endplate compression deformity at L1 compared to the recent CT scan, correlate for any interval trauma. This is concerning for possible subtle compression fracture.  Impression: The bowel gas pattern is suspicious for possible enteritis which could be infectious  Gastritis with questionable gastric antral ulceration  Compression deformity of L1 not seen previously could indicate interval developing compression fracture. Further investigation with bone scan or MRI may be useful. (No bone retropulsion)  Nonspecific subcutaneous left mid and lower paraspinal haziness may represent edema or bruising. No large focal hematomas are seen     One or more dose reduction techniques were used (e.g., Automated exposure control, adjustment of the mA and/or kV according to patient size, use of iterative reconstruction technique).  Radiologist location ID: [id number]      Assessment/ Plan:   Active Hospital Problems   (*Primary Problem)    Diagnosis    *Acute hip pain, left    Fall at home, sequela    Urinary tract infection    Acute left-sided low back pain without sciatica    Abnormal urinalysis    Gastritis, presence of bleeding unspecified, unspecified chronicity, unspecified gastritis type    Hypomagnesemia     Acute left hip pain/low back pain post fall at home, sequela  -tele/pulse ox  -imaging to left hip/pelvis, not done prior  -muscle relaxer and oral pain medications  -PT to evaluate, pt agreeable to rehab if recommended     Urinary tract infection  -ABX prophylactic-Levaquin  -pending urine culture     Hypomagnesemia  -Replenish when low  -daily mag level     Other  -Diabetic diet  -SSI with coverage  -DNR    01/06/23- Mg+ low and replenished. Urine culture final pending. Working with PT and  feeling better, less pain. Will need OP GI follow up for gastric antral ulceration.    01/07/23-Urine prelim showing gr - rods. Final pending. Mg+  low and replenished. Hip/Pelvic XR negative for acute process. To be up and out of bed more often. Bedside chair. PT working with pt.     Nutrition:   DIET DIABETIC Calorie amount: CC 2000; Do you want to initiate MNT Protocol? Yes  DIETARY ORAL SUPPLEMENTS Oral Supplements with tray: Ensure Clear-Apple; BREAKFAST/DINNER; 1 Each    Energy Calorie Requirements: 1636 kcal/day (30 kcal/kg IBW)  Protein Requirements (gms/day): 65 gm/day     Additional clinical characteristics related to nutrition:    - monitor for weight changes   - monitor intake and output    - monitor bowel functions     Nutrition Treatment Plan: Initiate oral supplement, Monitor weight trends  Lab Results   Component Value Date    ALBUMIN 2.5 (L) 01/06/2023         Disposition Planning:   home  The Hospitalist personally evaluated and examined the patient in conjunction with the MLP and agree with the assessments, treatment plan and disposition of the patient as recorded by the [facility].   Judene Companion, FNP-BC    [facility] MEDICINE HOSPITALIST

## 2023-01-07 NOTE — Care Plan (Signed)
Problem: Adult Inpatient Plan of Care  Goal: Plan of Care Review  Outcome: Ongoing (see interventions/notes)  Goal: Patient-Specific Goal (Individualized)  Outcome: Ongoing (see interventions/notes)  Goal: Absence of Hospital-Acquired Illness or Injury  Outcome: Ongoing (see interventions/notes)  Goal: Optimal Comfort and Wellbeing  Outcome: Ongoing (see interventions/notes)  Goal: Rounds/Family Conference  Outcome: Ongoing (see interventions/notes)     Problem: Health Knowledge, Opportunity to Enhance (Adult,Obstetrics,Pediatric)  Goal: Knowledgeable about Health Subject/Topic  Description: Patient will demonstrate the desired outcomes by discharge/transition of care.  Outcome: Ongoing (see interventions/notes)     Problem: Fall Injury Risk  Goal: Absence of Fall and Fall-Related Injury  Outcome: Ongoing (see interventions/notes)     Problem: Depression  Goal: Improved Mood  Outcome: Ongoing (see interventions/notes)

## 2023-01-07 NOTE — Care Plan (Signed)
Problem: Adult Inpatient Plan of Care  Goal: Plan of Care Review  Outcome: Ongoing (see interventions/notes)  Goal: Patient-Specific Goal (Individualized)  Outcome: Ongoing (see interventions/notes)  Flowsheets (Taken 01/07/2023 [phone])  Patient would like to participate in bedside shift report: Yes  Individualized Care Needs: monitor & replace electrolytes, monitor BP/HR, pain control  Anxieties, Fears or Concerns: none voiced at this time  Patient-Specific Goals (Include Timeframe): d/c home when medically stable  Plan of Care Reviewed With: patient  Goal: Absence of Hospital-Acquired Illness or Injury  Outcome: Ongoing (see interventions/notes)  Intervention: Identify and Manage Fall Risk  Recent Flowsheet Documentation  Taken 01/07/2023 0923 by Laurey Morale, RN  Safety Promotion/Fall Prevention:   activity supervised   fall prevention program maintained   nonskid shoes/slippers when out of bed   safety round/check completed  Intervention: Prevent Skin Injury  Recent Flowsheet Documentation  Taken 01/07/2023 0800 by Laurey Morale, RN  Body Position: supine, head elevated  Intervention: Prevent Infection  Recent Flowsheet Documentation  Taken 01/07/2023 0923 by Laurey Morale, RN  Infection Prevention:   glycemic control managed   rest/sleep promoted  Goal: Optimal Comfort and Wellbeing  Outcome: Ongoing (see interventions/notes)  Intervention: Provide Person-Centered Care  Recent Flowsheet Documentation  Taken 01/07/2023 1601 by Laurey Morale, RN  Trust Relationship/Rapport:   care explained   questions answered   thoughts/feelings acknowledged  Goal: Rounds/Family Conference  Outcome: Ongoing (see interventions/notes)     Problem: Health Knowledge, Opportunity to Enhance (Adult,Obstetrics,Pediatric)  Goal: Knowledgeable about Health Subject/Topic  Description: Patient will demonstrate the desired outcomes by discharge/transition of care.  Outcome: Ongoing (see interventions/notes)     Problem: Fall Injury Risk  Goal: Absence of  Fall and Fall-Related Injury  Outcome: Ongoing (see interventions/notes)  Intervention: Promote Injury-Free Environment  Recent Flowsheet Documentation  Taken 01/07/2023 0923 by Laurey Morale, RN  Safety Promotion/Fall Prevention:   activity supervised   fall prevention program maintained   nonskid shoes/slippers when out of bed   safety round/check completed     Problem: Depression  Goal: Improved Mood  Outcome: Ongoing (see interventions/notes)     Problem: Skin Injury Risk Increased  Goal: Skin Health and Integrity  Outcome: Ongoing (see interventions/notes)

## 2023-01-08 ENCOUNTER — Ambulatory Visit: Payer: Medicare Other

## 2023-01-08 ENCOUNTER — Encounter (INDEPENDENT_AMBULATORY_CARE_PROVIDER_SITE_OTHER): Payer: Self-pay | Admitting: HEMATOLOGY-ONCOLOGY

## 2023-01-08 DIAGNOSIS — K259 Gastric ulcer, unspecified as acute or chronic, without hemorrhage or perforation: Secondary | ICD-10-CM

## 2023-01-08 DIAGNOSIS — W19XXXA Unspecified fall, initial encounter: Secondary | ICD-10-CM

## 2023-01-08 DIAGNOSIS — Z881 Allergy status to other antibiotic agents status: Secondary | ICD-10-CM

## 2023-01-08 LAB — CBC
HCT: 26.6 % — ABNORMAL LOW (ref 31.2–41.9)
HGB: 8.9 g/dL — ABNORMAL LOW (ref 10.9–14.3)
MCH: 28.6 pg (ref 24.7–32.8)
MCHC: 33.5 g/dL (ref 32.3–35.6)
MCV: 85.4 fL (ref 75.5–95.3)
MPV: 9 fL (ref 7.9–10.8)
PLATELETS: 173 10*3/uL (ref 140–440)
RBC: 3.11 10*6/uL — ABNORMAL LOW (ref 3.63–4.92)
RDW: 18.4 % — ABNORMAL HIGH (ref 12.3–17.7)
WBC: 3.3 10*3/uL — ABNORMAL LOW (ref 3.8–11.8)

## 2023-01-08 LAB — BASIC METABOLIC PANEL
ANION GAP: 7 mmol/L (ref 4–13)
BUN/CREA RATIO: 12 (ref 6–22)
BUN: 11 mg/dL (ref 7–25)
CALCIUM: 9.8 mg/dL (ref 8.6–10.3)
CHLORIDE: 103 mmol/L (ref 98–107)
CO2 TOTAL: 29 mmol/L (ref 21–31)
CREATININE: 0.89 mg/dL (ref 0.60–1.30)
ESTIMATED GFR: 65 mL/min/{1.73_m2} (ref >59)
GLUCOSE: 172 mg/dL — ABNORMAL HIGH (ref 74–109)
OSMOLALITY, CALCULATED: 281 mosm/kg (ref 270–290)
POTASSIUM: 3.7 mmol/L (ref 3.5–5.1)
SODIUM: 139 mmol/L (ref 136–145)

## 2023-01-08 LAB — URINE CULTURE,ROUTINE: URINE CULTURE: >100000 — AB

## 2023-01-08 LAB — POC BLOOD GLUCOSE (RESULTS)
GLUCOSE, POC: 165 mg/dL — ABNORMAL HIGH (ref 70–100)
GLUCOSE, POC: 217 mg/dL — ABNORMAL HIGH (ref 70–100)
GLUCOSE, POC: 137 mg/dL — ABNORMAL HIGH (ref 70–100)
GLUCOSE, POC: 163 mg/dL — ABNORMAL HIGH (ref 70–100)

## 2023-01-08 LAB — MAGNESIUM: MAGNESIUM: 1.4 mg/dL — ABNORMAL LOW (ref 1.9–2.7)

## 2023-01-08 MED ORDER — LEVOFLOXACIN 500 MG TABLET
500.0000 mg | ORAL_TABLET | ORAL | 0 refills | Status: AC
Start: 2023-01-08 — End: 2023-01-11

## 2023-01-08 MED ORDER — CARVEDILOL 6.25 MG TABLET
6.2500 mg | ORAL_TABLET | Freq: Two times a day (BID) | ORAL | 0 refills | Status: DC
Start: 2023-01-09 — End: 2023-04-04

## 2023-01-08 MED ORDER — POLYETHYLENE GLYCOL 3350 17 GRAM ORAL POWDER PACKET
17.0000 g | Freq: Every day | ORAL | Status: DC
Start: 2023-01-08 — End: 2023-01-09
  Administered 2023-01-08: 17 g via ORAL
  Administered 2023-01-09: 0 g via ORAL
  Filled 2023-01-08 (×2): qty 1

## 2023-01-08 MED ORDER — DOCUSATE SODIUM 100 MG CAPSULE
100.0000 mg | ORAL_CAPSULE | Freq: Two times a day (BID) | ORAL | Status: DC
Start: 2023-01-08 — End: 2023-04-04

## 2023-01-08 MED ORDER — MAGNESIUM SULFATE 1 GRAM/100 ML IN DEXTROSE 5 % INTRAVENOUS PIGGYBACK
1.0000 g | INJECTION | INTRAVENOUS | Status: DC
Start: 2023-01-08 — End: 2023-01-08
  Administered 2023-01-08: 1 g via INTRAVENOUS
  Administered 2023-01-08 (×2): 0 g via INTRAVENOUS
  Administered 2023-01-08 (×2): 1 g via INTRAVENOUS
  Administered 2023-01-08: 0 g via INTRAVENOUS
  Filled 2023-01-08 (×4): qty 100

## 2023-01-08 MED ORDER — BISACODYL 10 MG RECTAL SUPPOSITORY
10.0000 mg | Freq: Once | RECTAL | Status: AC
Start: 2023-01-08 — End: 2023-01-08
  Administered 2023-01-08: 10 mg via RECTAL
  Filled 2023-01-08: qty 1

## 2023-01-08 MED ORDER — MAGNESIUM SULFATE 1 GRAM/100 ML IN DEXTROSE 5 % INTRAVENOUS PIGGYBACK
1.0000 g | INJECTION | INTRAVENOUS | Status: AC
Start: 2023-01-08 — End: 2023-01-08
  Administered 2023-01-08: 1 g via INTRAVENOUS
  Administered 2023-01-08: 0 g via INTRAVENOUS

## 2023-01-08 MED ORDER — METHOCARBAMOL 1,000 MG TABLET
1000.0000 mg | ORAL_TABLET | Freq: Four times a day (QID) | ORAL | 0 refills | Status: AC | PRN
Start: 2023-01-08 — End: 2023-01-18

## 2023-01-08 MED ORDER — CARVEDILOL 6.25 MG TABLET
6.2500 mg | ORAL_TABLET | Freq: Two times a day (BID) | ORAL | Status: DC
Start: 2023-01-08 — End: 2023-01-09
  Administered 2023-01-08 – 2023-01-09 (×3): 6.25 mg via ORAL
  Filled 2023-01-08 (×3): qty 1

## 2023-01-08 MED ORDER — POLYETHYLENE GLYCOL 3350 17 GRAM ORAL POWDER PACKET
17.0000 g | Freq: Every day | ORAL | 0 refills | Status: AC
Start: 2023-01-09 — End: 2023-01-16

## 2023-01-08 NOTE — Care Management Notes (Signed)
[facility]  Care Management Initial Evaluation    Patient Name: Kelsey Preston  Date of Birth: 1940/12/25  Sex: female  Date/Time of Admission: 01/05/2023  9:23 AM  Room/Bed: 431/B  Payor: MEDICARE / Plan: MEDICARE PART A AND B / Product Type: Medicare /   PCP: Charm Rings, MD    Pharmacy Info:   Preferred Pharmacy       [facility] - [place], [place] - [address]    [address] [place] [address]    Phone: [phone] Fax: [phone]    Hours: Not open 24 hours    [facility] [address], [place] - [address]    [address] [place] [address]    Phone: [phone] Fax: [phone]    Hours: Not open 24 hours          Emergency Contact Info:   Extended Emergency Contact Information  Primary Emergency Contact: [facility]  Address: [address]           [place], [place] [address] [place]  Home Phone: [phone]  Mobile Phone: [phone]  Relation: Husband  Interpreter needed? No  Secondary Emergency Contact: SNIDER,DONNA  Mobile Phone: [phone]  Relation: Daughter  Preferred language: English  Interpreter needed? No    History:   Kelsey Preston is a 82 y.o., female, admitted 01-05-2023    Height/Weight: 162.6 cm (5\' 4" ) / 92.6 kg (204 lb 2 oz)     LOS: 3 days   Admitting Diagnosis: Acute hip pain, left [M25.552]    Assessment:      01/08/23 1126   Assessment Details   Assessment Type Admission   Insurance Information/Type   Insurance type Medicare   Living Environment   Lives With spouse   Able to Return to Prior Arrangements yes   Living Arrangement Comments Lives at home with her husband   Care Management Plan   Discharge Planning Status initial meeting   Discharge plan discussed with: Patient   CM will evaluate for rehabilitation potential yes   Patient choice offered to patient/family yes   Facility or Agency Preferences [facility]   Discharge Needs Assessment   Equipment Currently Used at Home  walker, front wheeled   Discharge Facility/Level of Care Needs Home with Home Health (code 6)   Transportation Available family or friend will provide   Referral Information   Admission Type inpatient     CM initial discharge planning assessment. Pt is an alert 82 female admitted with acute hip pain, left. Pt lives at home with her husband. Pt feels safe at home.Pt depends on others for his transportation needs and is able to access medical care and medications when needed. Pt denies services in the home prior to her admission. PT evaluation recommends the following: Based on current diagnosis, functional performance prior to admission, and current functional performance, this patient requires continued PT services in (P) home with assist, home with home health in order to achieve significant functional improvements in these deficit areas: (P) aerobic capacity/endurance, gait, locomotion, and balance, posture, muscle performance, ROM (range of motion). CM discussed home health with Pt and gave Pt the names of the agencies in the area that provide care. Pt has selected [facility] to provide care. CM to submit a referral through [facility] for review.     Discharge Plan:  Home with Home Health (code 6)      The patient will  continue to be evaluated for developing discharge needs.     Case Manager: Quintin Alto, MSW 01/08/2023, 11:28  Phone: [phone]

## 2023-01-08 NOTE — Care Plan (Signed)
Problem: Adult Inpatient Plan of Care  Goal: Plan of Care Review  Outcome: Ongoing (see interventions/notes)  Goal: Patient-Specific Goal (Individualized)  Outcome: Ongoing (see interventions/notes)  Flowsheets (Taken 01/08/2023 0824)  Patient would like to participate in bedside shift report: Yes  Individualized Care Needs: monitor BP/HR, replace electrolytes, pain control, abx therapy  Anxieties, Fears or Concerns: none voiced at this time  Patient-Specific Goals (Include Timeframe): d/c home when medically stable  Plan of Care Reviewed With: patient  Goal: Absence of Hospital-Acquired Illness or Injury  Outcome: Ongoing (see interventions/notes)  Intervention: Identify and Manage Fall Risk  Recent Flowsheet Documentation  Taken 01/08/2023 0824 by Laurey Morale, RN  Safety Promotion/Fall Prevention:   activity supervised   fall prevention program maintained   nonskid shoes/slippers when out of bed   safety round/check completed  Intervention: Prevent and Manage VTE (Venous Thromboembolism) Risk  Recent Flowsheet Documentation  Taken 01/08/2023 0824 by Laurey Morale, RN  VTE Prevention/Management: anticoagulant therapy maintained  Intervention: Prevent Infection  Recent Flowsheet Documentation  Taken 01/08/2023 0824 by Laurey Morale, RN  Infection Prevention:   glycemic control managed   rest/sleep promoted  Goal: Optimal Comfort and Wellbeing  Outcome: Ongoing (see interventions/notes)  Intervention: Provide Person-Centered Care  Recent Flowsheet Documentation  Taken 01/08/2023 0824 by Laurey Morale, RN  Trust Relationship/Rapport:   care explained   questions answered   thoughts/feelings acknowledged  Goal: Rounds/Family Conference  Outcome: Ongoing (see interventions/notes)     Problem: Health Knowledge, Opportunity to Enhance (Adult,Obstetrics,Pediatric)  Goal: Knowledgeable about Health Subject/Topic  Description: Patient will demonstrate the desired outcomes by discharge/transition of care.  Outcome: Ongoing (see  interventions/notes)     Problem: Fall Injury Risk  Goal: Absence of Fall and Fall-Related Injury  Outcome: Ongoing (see interventions/notes)  Intervention: Promote Injury-Free Environment  Recent Flowsheet Documentation  Taken 01/08/2023 0824 by Laurey Morale, RN  Safety Promotion/Fall Prevention:   activity supervised   fall prevention program maintained   nonskid shoes/slippers when out of bed   safety round/check completed     Problem: Depression  Goal: Improved Mood  Outcome: Ongoing (see interventions/notes)     Problem: Skin Injury Risk Increased  Goal: Skin Health and Integrity  Outcome: Ongoing (see interventions/notes)

## 2023-01-08 NOTE — Care Plan (Signed)
[facility]  [address]  [place], [address]  [phone]  (Fax) [phone]  Rehabilitation Department  Physical Therapy Daily Inpatient Note    Date: 01/08/2023  Patient's Name: Kelsey Preston  Date of Birth: 1940/10/02  Height: Height: 162.6 cm (5\' 4" )  Weight: Weight: 92.6 kg (204 lb 2 oz)      Plan: Will continue under current POC.      Discharge Disposition: (P) home with assist, home with home health        Subjective/Objective/Assessment:  Flowsheet    01/08/23 0929   Rehab Session   Document Type therapy progress note (daily note)   PT Visit Date 01/08/23   General Information   Patient Profile Reviewed yes   Medical Lines PIV Line;Telemetry   Respiratory Status room air   Existing Precautions/Restrictions no known precautions/limitations   Pre Treatment Status   Pre Treatment Patient Status Patient supine in bed   Support Present Pre Treatment  Family present   Programmer, multimedia Nurse   Communication Pre Treatment Comment cleared for PT   Pre- Treatment Vital Signs   Pre-Treatment Heart Rate (beats/min) 90   Pre SpO2 (%) 98   O2 Delivery Pre Treatment room air   Pre-Treatment Pain   Pretreatment Pain Rating 0/10 - no pain   Bed Mobility Assessment/Treatment   Bed Mobility bed mobility (all) activities   All Activities, Independence (Bed Mobility) modified independence   Transfer Assessment/Treatment   Sit-Stand Independence contact guard assist   Stand-Sit Independence contact guard assist   Sit-Stand-Sit, Assist Device walker, front wheeled   Gait Assessment/Treatment   Total Distance Ambulated 300   Independence  1 person + 1 person to manage equipment;contact guard assist   Assistive Device  walker, front wheeled   Distance in Feet 300   Deviations  narrow BOS;step length decreased;stride length decreased   Post Treatment Status   Post Treatment Patient Status Patient sitting in bedside chair or w/c;Call light within reach;Telephone within  reach;Patient safety alarm activated   Support Present Post Treatment  None   Patient Effort excellent   Post-Treatment Vital Signs   Post-treatment Heart Rate (beats/min) 96   Post SpO2 (%) 103   O2 Delivery Post Treatment room air   Cognitive Assessment/Intervention   Behavior/Mood Observations behavior appropriate to situation, WNL/WFL   Physical Therapy Time and Intention   Total PT Minutes: 20   Therapy Plan Review/Discharge Plan (PT)   Anticipated Discharge Disposition home with assist;home with home health                 Intervention minutes: GAIT TRAINING 20 MINUTES    THERAPIST  Laurence Aly, PTA  01/08/2023, 12:34

## 2023-01-08 NOTE — PT Treatment (Signed)
[facility] Medicine [facility]  [address]  [place], [address]  [phone]  (Fax) [phone]  Rehabilitation Department  Physical Therapy Daily Inpatient Note    Date: 01/08/2023  Patient's Name: Kelsey Preston  Date of Birth: 09-16-1940  Height: Height: 162.6 cm (5\' 4" )  Weight: Weight: 92.6 kg (204 lb 2 oz)      Plan: Will continue under current POC.      Discharge Disposition: home with assist, home with home health        Subjective/Objective/Assessment:  Flowsheet    01/07/23 1417   Rehab Session   Document Type therapy progress note (daily note)   PT Visit Date 01/07/23   General Information   Patient Profile Reviewed yes   Medical Lines PIV Line;Telemetry   Respiratory Status room air   Existing Precautions/Restrictions DNR;fall precautions   Pre Treatment Status   Pre Treatment Patient Status Nurse approved session;Patient supine in bed   Pre- Treatment Vital Signs   Pre-Treatment Heart Rate (beats/min) 91   Pre SpO2 (%) 95   O2 Delivery Pre Treatment room air   Bed Mobility Assessment/Treatment   Bed Mobility bed mobility (all) activities   All Activities, Independence (Bed Mobility) modified independence   Transfer Assessment/Treatment   Sit-Stand Independence contact guard assist   Stand-Sit Independence contact guard assist   Sit-Stand-Sit, Assist Device walker, front wheeled   Gait Assessment/Treatment   Total Distance Ambulated 350   Independence  contact guard assist   Assistive Device  walker, front wheeled   Distance in Feet pt took one seated rest   Gait Speed decreased   Deviations  narrow BOS;step length decreased;stride length decreased   Post Treatment Status   Post Treatment Patient Status Patient sitting in bedside chair or w/c;Call light within reach;Telephone within reach;Patient safety alarm activated   Patient Effort excellent   Post-Treatment Vital Signs   Post-treatment Heart Rate (beats/min) 99   Post SpO2 (%) 95   O2 Delivery Post Treatment room air    Cognitive Assessment/Intervention   Behavior/Mood Observations behavior appropriate to situation, WNL/WFL   Physical Therapy Time and Intention   Total PT Minutes: 19   Therapy Plan Review/Discharge Plan (PT)   Anticipated Discharge Disposition home with assist;home with 24/7 assistance     Pt tolerated gt trng well using RW. Stated it felt so good to get up and walk. She gaited about 350' with one seated rest break using RW. She returns to a vascular chair with needs in reach and chair alarmed.             Intervention minutes: GAIT TRAINING [address] MINUTES    THERAPIST  Raynald Kemp, PTA  01/08/2023, 15:32

## 2023-01-08 NOTE — Discharge Summary (Addendum)
[facility]  DISCHARGE SUMMARY    PATIENT NAME:  Kelsey, Preston  MRN:  [id number]  DOB:  26-Sep-1940    ENCOUNTER DATE:  01/05/2023  INPATIENT ADMISSION DATE: 01/05/2023  DISCHARGE DATE:  01/08/2023    ATTENDING PHYSICIAN: Dr. Evelene Croon  SERVICE: PRN HOSPITALIST 3  PRIMARY CARE PHYSICIAN: Charm Rings, MD       No lay caregiver identified.    PRIMARY DISCHARGE DIAGNOSIS: Acute hip pain, left  Active Hospital Problems    Diagnosis Date Noted    Principal Problem: Acute hip pain, left [M25.552] 01/05/2023    Fall at home, sequela [W19.XXXS, Y92.009] 01/05/2023    Urinary tract infection [N39.0] 01/05/2023    Acute left-sided low back pain without sciatica [M54.50] 01/05/2023    Abnormal urinalysis [R82.90] 01/05/2023    Gastritis, presence of bleeding unspecified, unspecified chronicity, unspecified gastritis type [K29.70] 01/05/2023    Hypomagnesemia [E83.42] 12/27/2022      Resolved Hospital Problems   No resolved problems to display.     Active Non-Hospital Problems    Diagnosis Date Noted    Sepsis (CMS HCC) 10/07/2022    Abdominal pain 10/07/2022    Breast cancer (CMS HCC) 09/10/2022    Axillary mass, left 07/26/2022    DVT (deep venous thrombosis) (CMS HCC) 05/31/2022    Chest pain 05/30/2022    History of lung cancer 05/30/2022    Essential hypertension 05/30/2022    GERD (gastroesophageal reflux disease) 05/30/2022    DM2 (diabetes mellitus, type 2) (CMS HCC) 05/30/2022    Acute deep vein thrombosis (DVT) of femoral vein of left lower extremity (CMS HCC) 05/30/2022             Current Discharge Medication List        START taking these medications.        Details   carvediloL 6.25 mg Tablet  Commonly known as: COREG  Start taking on: January 09, 2023   6.25 mg, Oral, 2 TIMES DAILY WITH FOOD  Qty: 60 Tablet  Refills: 0     docusate sodium 100 mg Capsule  Commonly known as: COLACE   100 mg, Oral, 2 TIMES DAILY  Refills: 0     levoFLOXacin 500 mg Tablet  Commonly known as: LEVAQUIN   500 mg, Oral,  EVERY 24 HOURS  Qty: 3 Tablet  Refills: 0     methocarbamoL 1,000 mg Tablet  Commonly known as: ROBAXIN   1,000 mg, Oral, 4 TIMES DAILY PRN  Qty: 40 Tablet  Refills: 0     polyethylene glycol 17 gram Powder in Packet  Commonly known as: MIRALAX  Start taking on: January 09, 2023   17 g, Oral, DAILY  Qty: 7 Packet  Refills: 0            CONTINUE these medications - NO CHANGES were made during your visit.        Details   atorvastatin 40 mg Tablet  Commonly known as: LIPITOR   40 mg, Oral, EVERY EVENING  Qty: 30 Tablet  Refills: 0     esomeprazole magnesium 40 mg Capsule, Delayed Release(E.C.)  Commonly known as: NEXIUM   40 mg, Oral, EVERY MORNING BEFORE BREAKFAST  Refills: 0     ezetimibe 10 mg Tablet  Commonly known as: ZETIA   10 mg, Oral, EVERY EVENING  Refills: 0     famotidine 40 mg Tablet  Commonly known as: PEPCID   40 mg, Oral, DAILY  Refills: 0  magnesium oxide 400 mg Tablet  Commonly known as: MAG-OX   400 mg, Oral, 2 TIMES DAILY  Refills: 0     metFORMIN 500 mg Tablet Sustained Release 24 hr  Commonly known as: GLUCOPHAGE XR   500 mg, Oral, 2 TIMES DAILY  Refills: 0     multivitamin with iron Tablet   1 Tablet, Oral, DAILY  Refills: 0     ondansetron 4 mg Tablet, Rapid Dissolve  Commonly known as: ZOFRAN ODT   4 mg, Oral, EVERY 8 HOURS PRN  Qty: 30 Tablet  Refills: 3     PRESERVISION AREDS-2 ORAL   1 Tablet, Oral, 2 TIMES DAILY  Refills: 0     sertraline 50 mg Tablet  Commonly known as: ZOLOFT   50 mg, Oral, DAILY  Refills: 0     telmisartan 80 mg Tablet  Commonly known as: MICARDIS   40 mg, Oral, DAILY  Refills: 0     traMADoL 50 mg Tablet  Commonly known as: ULTRAM   50 mg, Oral, EVERY 4 HOURS PRN  Qty: 20 Tablet  Refills: 1     Xarelto 20 mg Tablet  Generic drug: rivaroxaban   20 mg, Oral, EVERY EVENING AFTER DINNER  Qty: 30 Tablet  Refills: 3            Discharge med list refreshed?  YES     Allergies   Allergen Reactions    Adhesive Rash    Ceclor [Cefaclor]  Other Adverse Reaction (Add comment)      Pt states a Ceclor pill gave her blisters in her mouth.     HOSPITAL PROCEDURE(S):   No orders of the defined types were placed in this encounter.      REASON FOR HOSPITALIZATION AND HOSPITAL COURSE   BRIEF HPI:  This is a 82 y.o., female admitted for acute left hip pain, left sided LBP  BRIEF HOSPITAL NARRATIVE:    HPI: 82 yr old female presented to the ED with left hip and left low back pain from a fall on Tuesday at home around 9pm. She uses a walker and there was a metal piece going across the floor from floor to carpet, and her walker caught and she fell straight down on her buttocks. She has had balance problems for about 4 years and family states she often falls backwards. No head trauma. She was seen here on the 16th for continued pain, seen and released. She had a CT of the lumbar that was negative for subluxation or fracture. Pt states her pain has gotten worse over the last few days, no pain when lying still, but pain is severe with movement. She describes it as a "catching" pain and "muscle spasms". She is currently being treated for breast cancer with chemo, last tx three Fridays ago. She is getting OP mag level with frequent infusions every Friday. At first with chemo she had some diarrhea, but not having now. No abdominal pain, occasional nausea from chemo, no vomiting.  CT abdomen performed in ED, question of enteritis, questionable gastritis with gastric antral ulceration and concern for possible developing L1 compression fracture. Pt states she has been told before she had a compression fracture in her L-spine.   Hospital Course: Admitted to tele/pulse ox. PT consulted. Left hip/pelvis XR negative for fracture. Electrolytes replaced as needed. Put on diabetic diet with SSI. Her urine showed up e-coli and klebsiella and she was on Levaquin which is sensitive for both. She was given a  muscle relaxer which helped and had not been taking the pain medication. She is DNR. SSI coverage for her  diabetes. CM spoke with pt and she would like HH/PT. She will be discharged to home today. She has a PET scan on 10/24. She will need GI follow-up for her gastric antral ulceration. She will be on 3 more days of Levaquin for the UTI. To see her PCP, Dr. [name] and GI. Kept overnight due to no bowel movement. Meds and suppository given, had BM today and can go home after her mag is checked. If low to be replenished. To keep appt for check on mg+ tomorrow since still low today. [facility] HH/PT set up for pt.     Physical:  Physical Exam  Constitutional:       Appearance: She is obese.   HENT:      Head: Normocephalic and atraumatic.      Nose: Nose normal.      Mouth/Throat:      Mouth: Mucous membranes are moist.   Eyes:      Extraocular Movements: Extraocular movements intact.      Pupils: Pupils are equal, round, and reactive to light.   Cardiovascular:      Rate and Rhythm: Normal rate and regular rhythm.      Pulses: Normal pulses.      Heart sounds: Normal heart sounds.   Pulmonary:      Effort: Pulmonary effort is normal.      Breath sounds: Normal breath sounds.   Abdominal:      General: Bowel sounds are normal.      Palpations: Abdomen is soft.   Musculoskeletal:      Cervical back: Normal range of motion and neck supple.      Right lower leg: Edema present.      Left lower leg: Edema present.   Skin:     General: Skin is warm and dry.      Capillary Refill: Capillary refill takes less than 2 seconds.      Comments: Lesions to face, chronic   Neurological:      General: No focal deficit present.      Mental Status: She is alert and oriented to person, place, and time.   Psychiatric:         Mood and Affect: Mood normal.         Thought Content: Thought content normal.      CONDITION ON DISCHARGE:  A. Ambulation: Up with assistance only  B. Self-care Ability: With partial assistance  C. Cognitive Status Oriented x 3  D. Code status at discharge: DNR      LINES/DRAINS/WOUNDS AT DISCHARGE:   Patient  Lines/Drains/Airways Status       Active Line / Dialysis Catheter / Dialysis Graft / Drain / Airway / Wound       Name Placement date Placement time Site Days    Implantable Port Right Chest 01/05/23  1010  -- 3    External Urinary Catheter 01/07/23  1030  -- 1    Wound  Incision Right Chest 09/26/22  1001  -- 104                    DISCHARGE DISPOSITION:  Home discharge, Home Health, and Home PT  DISCHARGE INSTRUCTIONS:  Post-Discharge Follow Up Appointments       Friday Jan 10, 2023    Lab with LAB 1, ONCOLOGY INFUSION PRN at 10:30 AM  Monday Jan 13, 2023    PRN TTE ADULT OP with PRN ECHO 2 at 11:00 AM      Wednesday Jan 15, 2023    Follow up with Charm Rings, MD    Phone: [phone]    Where: [address] [facility] [address]      Friday Jan 17, 2023    Return Patient Visit with Lupita Dawn, MD at  8:30 AM    Infusion with CHAIR 11 - 226, ONCOLOGY INFUSION PRN at  9:30 AM    Follow up with Lupita Dawn, MD    Phone: [phone]    Where: [facility]      Friday Feb 07, 2023    Infusion with CHAIR 11 - 226, ONCOLOGY INFUSION PRN at  9:30 AM      Hematology/Oncology, [facility], [place]  [address]  [place] [address]  [phone] Hematology/Oncology, [facility], [place]   [address]  [place] [address]  [phone] [place] [facility]  [facility], [place]   [address]  [place] [address]  [phone]             Refer to Home Health - EXTERNAL          Judene Companion, FNP-BC    Copies sent to Care Team         Relationship Specialty Notifications Start End    Charm Rings, MD PCP - General GENERAL PRACTICE  07/11/21     Phone: [phone] Fax: [phone]         [address] [place] [address]    Marvene Staff, RN Oncology Nurse Navigator  Abnormal results only, Admissions 09/11/22     Lupita Dawn, MD   HEMATOLOGY-ONCOLOGY  10/11/22     Phone: [phone] Fax: [phone]         [address] [place] [address]            Referring providers can utilize [email or web address] to access their referred [facility] patient's information.              Patient seen and examined bedside.     Patient had bowel movement. Doing well. Checking Mg today to replace. Patient has bene hypoMg with chemotherapy and needing IV Mg replacement. Discussed with daughter and patient regarding blood pressure medications. Patient is on two medications, only one listed in home med list. Patient not sure which second medication she takes. Patient advised to go back on home medication if she is taking two. However if she is only on one - recommended to add coreg. Monitor and note BP every day and f/u with PCP. Patient verbalized understanding.       Marya Amsler, MD

## 2023-01-08 NOTE — Care Plan (Signed)
Problem: Adult Inpatient Plan of Care  Goal: Plan of Care Review  Outcome: Ongoing (see interventions/notes)  Goal: Patient-Specific Goal (Individualized)  Outcome: Ongoing (see interventions/notes)  Goal: Absence of Hospital-Acquired Illness or Injury  Outcome: Ongoing (see interventions/notes)  Goal: Optimal Comfort and Wellbeing  Outcome: Ongoing (see interventions/notes)  Goal: Rounds/Family Conference  Outcome: Ongoing (see interventions/notes)     Problem: Health Knowledge, Opportunity to Enhance (Adult,Obstetrics,Pediatric)  Goal: Knowledgeable about Health Subject/Topic  Description: Patient will demonstrate the desired outcomes by discharge/transition of care.  Outcome: Ongoing (see interventions/notes)     Problem: Fall Injury Risk  Goal: Absence of Fall and Fall-Related Injury  Outcome: Ongoing (see interventions/notes)     Problem: Depression  Goal: Improved Mood  Outcome: Ongoing (see interventions/notes)     Problem: Skin Injury Risk Increased  Goal: Skin Health and Integrity  Outcome: Ongoing (see interventions/notes)

## 2023-01-08 NOTE — Discharge Instructions (Addendum)
You are started on coreg twice daily; however if you are taking another water pill at home; do not take coreg. Check blood pressure daily - note down and f/u with your PCP.   Also do not take coreg if your blood pressure is below 120 mm hg upper number (systolic)

## 2023-01-09 ENCOUNTER — Encounter (INDEPENDENT_AMBULATORY_CARE_PROVIDER_SITE_OTHER): Payer: Self-pay | Admitting: HEMATOLOGY-ONCOLOGY

## 2023-01-09 ENCOUNTER — Ambulatory Visit (HOSPITAL_COMMUNITY): Payer: Self-pay

## 2023-01-09 DIAGNOSIS — Z96642 Presence of left artificial hip joint: Secondary | ICD-10-CM

## 2023-01-09 LAB — POC BLOOD GLUCOSE (RESULTS)
GLUCOSE, POC: 132 mg/dL — ABNORMAL HIGH (ref 70–100)
GLUCOSE, POC: 190 mg/dL — ABNORMAL HIGH (ref 70–100)
GLUCOSE, POC: 196 mg/dL — ABNORMAL HIGH (ref 70–100)
GLUCOSE, POC: 188 mg/dL — ABNORMAL HIGH (ref 70–100)

## 2023-01-09 LAB — MAGNESIUM: MAGNESIUM: 1.6 mg/dL — ABNORMAL LOW (ref 1.9–2.7)

## 2023-01-09 MED ORDER — MAGNESIUM SULFATE 1 GRAM/100 ML IN DEXTROSE 5 % INTRAVENOUS PIGGYBACK
1.0000 g | INJECTION | INTRAVENOUS | Status: AC
Start: 2023-01-09 — End: 2023-01-09
  Administered 2023-01-09: 0 g via INTRAVENOUS
  Administered 2023-01-09 (×2): 1 g via INTRAVENOUS
  Administered 2023-01-09 (×3): 0 g via INTRAVENOUS
  Administered 2023-01-09 (×2): 1 g via INTRAVENOUS
  Filled 2023-01-09 (×4): qty 100

## 2023-01-09 NOTE — Nurses Notes (Signed)
Patient discharge home with daughter. Patient ambulated to wheelchair with 1 assist; left by car. Patient is alert x4 on RA and port deaccessed. All belongings sent home.

## 2023-01-09 NOTE — PT Treatment (Signed)
[facility] Medicine [facility]  [address]  [place], [address]  [phone]  (Fax) [phone]  Rehabilitation Department  Physical Therapy Daily Inpatient Note    Date: 01/09/2023  Patient's Name: Kelsey Preston  Date of Birth: [birth year]  Height: Height: 162.6 cm (5\' 4" )  Weight: Weight: 92.6 kg (204 lb 2 oz)      Plan: Will continue under current POC.      Discharge Disposition: (P) home with assist, home with home health        Subjective/Objective/Assessment:  Flowsheet    01/09/23 1333   Rehab Session   Document Type therapy progress note (daily note)   PT Visit Date 01/09/23   Total PT Minutes: 14   Patient Effort excellent   General Information   Patient Profile Reviewed yes   Medical Lines PIV Line;Telemetry   Pre Treatment Status   Pre Treatment Patient Status Patient sitting in bedside chair or w/c;Call light within reach;Patient in restroom;Patient safety alarm activated   Support Present Pre Treatment  Family present   Cognitive Assessment/Interventions   Behavior/Mood Observations behavior appropriate to situation, WNL/WFL   Vital Signs   Pre-Treatment Heart Rate (beats/min) 89   Post-treatment Heart Rate (beats/min) 89   Pre SpO2 (%) 96   O2 Delivery Pre Treatment room air   Post SpO2 (%) 97   O2 Delivery Post Treatment room air   Pain Assessment   Pretreatment Pain Rating 0/10 - no pain   Posttreatment Pain Rating 0/10 - no pain   Transfer Assessment/Treatment   Sit-Stand Independence contact guard assist   Stand-Sit Independence contact guard assist   Sit-Stand-Sit, Assist Device walker, front wheeled   Gait Assessment/Treatment   Total Distance Ambulated 400   Independence  contact guard assist   Assistive Device  walker, front wheeled   Balance   Sitting Balance: Static good balance   Sitting, Dynamic (Balance) good balance   Sit-to-Stand Balance fair + balance   Standing Balance: Static fair + balance   Standing Balance: Dynamic fair + balance   Post Treatment Status    Post Treatment Patient Status Patient sitting in bedside chair or w/c;Call light within reach;Patient safety alarm activated   Support Present Post Treatment  None   Physical Therapy Clinical Impression   Assessment Patient stands with CGA from vascular chair. She gaits with RW and CGA. She returns to chair with needs in reach and chair check activated.   Anticipated Discharge Disposition home with assist;home with home health                 Intervention minutes: GAIT TRAINING 14 MINUTES    THERAPIST  Lacrecia Delval, PTA  01/09/2023, 15:37

## 2023-01-09 NOTE — Progress Notes (Signed)
[place] MEDICINE [facility]    HOSPITALIST PROGRESS NOTE    Kelsey Preston  Date of service: 01/09/2023  Date of Admission:  01/05/2023  Hospital Day:  LOS: 4 days     Subjective: Pt seen and examined. She is being followed for acure left hip and left sided low back pain, sequaela, UTI, and hypomagnesemia. Yesterday to discharge her home but she had not had a bowel movement. On medication and also had a suppository. Kept overnight and had a large bowel movement this morning. Daughter asked for mag level since she has an appt tomorrow to have it checked. If low will replenish and then proceed with discharge. Daughter present.     Vital Signs:  Filed Vitals:    01/08/23 2221 01/09/23 0740 01/09/23 1004 01/09/23 1104   BP:  (!) 150/67  (!) 144/87   Pulse: 83 92 92 90   Resp:  16  16   Temp:  36.1 C (97 F)  36.2 C (97.2 F)   SpO2:  94%  96%        Physical Exam:  Constitutional:       Appearance: She is obese.   HENT:      Head: Normocephalic and atraumatic.      Nose: Nose normal.      Mouth/Throat:      Mouth: Mucous membranes are moist.   Eyes:      Extraocular Movements: Extraocular movements intact.      Pupils: Pupils are equal, round, and reactive to light.   Cardiovascular:      Rate and Rhythm: Normal rate and regular rhythm.      Pulses: Normal pulses.      Heart sounds: Normal heart sounds.   Pulmonary:      Effort: Pulmonary effort is normal.      Breath sounds: Normal breath sounds.   Abdominal:      General: Bowel sounds are normal.      Palpations: Abdomen is soft.   Musculoskeletal:      Cervical back: Normal range of motion and neck supple.      Right lower TKZ:SWFU edema present.      Left lower leg: Mild edema present.   Skin:     General: Skin is warm and dry.      Capillary Refill: Capillary refill takes less than 2 seconds.      Comments: Lesions to face, chronic   Neurological:      General: No focal deficit present.      Mental Status: She is alert and oriented to person, place,  and time.   Psychiatric:         Mood and Affect: Mood normal.         Thought Content: Thought content normal.     Intake & Output:    Intake/Output Summary (Last 24 hours) at 01/09/2023 1143  Last data filed at 01/09/2023 0900  Gross per 24 hour   Intake 840 ml   Output --   Net 840 ml     I/O current shift:  10/24 0700 - 10/24 1859  In: 360 [P.O.:360]  Out: -   Emesis:    BM:    Date of Last Bowel Movement: 01/08/23  Heme:      acetaminophen (TYLENOL) tablet, 650 mg, Oral, Q6H PRN  alcohol 62 % (NOZIN NASAL SANITIZER) nasal solution, 1 Each, Each Nostril, 2x/day  carvedilol (COREG) tablet, 6.25 mg, Oral, 2x/day-Food  Correction/SSIP insulin lispro (HumaLOG)  100 units/mL injection, 3-14 Units, Subcutaneous, 4x/day AC  dextrose (GLUTOSE) 40% oral gel, 15 g, Oral, Q15 Min PRN  dextrose 50% (0.5 g/mL) injection - syringe, 12.5 g, Intravenous, Q15 Min PRN  glucagon (GLUCAGEN DIAGNOSTIC KIT) injection 1 mg, 1 mg, IntraMUSCULAR, Once PRN  HYDROcodone-acetaminophen (NORCO) 5-325 mg per tablet, 1 Tablet, Oral, Q6H PRN  levoFLOXacin (LEVAQUIN) tablet, 500 mg, Oral, Q24H  losartan (COZAAR) tablet, 100 mg, Oral, Daily  methocarbamol (ROBAXIN) tablet, 1,000 mg, Oral, 4x/day PRN  NS flush syringe, 10-20 mL, Intravenous, Q8HRS  NS flush syringe, 20-30 mL, Intravenous, Q1 MIN PRN  ondansetron (ZOFRAN) 2 mg/mL injection, 4 mg, Intravenous, Q8H PRN  pantoprazole (PROTONIX) delayed release tablet, 40 mg, Oral, Daily  polyethylene glycol (MIRALAX) oral packet, 17 g, Oral, Daily  ramelteon (ROZEREM) tablet, 8 mg, Oral, HS PRN  rivaroxaban (XARELTO) tablet, 20 mg, Oral, Daily with Dinner          Labs:  No results found for this or any previous visit (from the past 48 hour(s)).     Results for orders placed or performed during the hospital encounter of 01/05/23 (from the past 48 hour(s))   BASIC METABOLIC PANEL    Collection Time: 01/08/23  4:18 AM   Result Value    SODIUM 139    POTASSIUM 3.7    CHLORIDE 103    CO2 TOTAL 29     GLUCOSE 172 (H)    BUN 11    CREATININE 0.89      No results found for this or any previous visit (from the past 48 hour(s)).     No results found for this or any previous visit (from the past 48 hour(s)).   No results found for this or any previous visit (from the past 48 hour(s)).   No results found for this or any previous visit (from the past 1344 hour(s)).   No results found for this or any previous visit (from the past 48 hour(s)).     Microbiology:  No results found for any visits on 01/05/23 (from the past 96 hour(s)).      Imaging:   XR HIP LEFT W PELVIS 2-3 VIEWS  Narrative: XBJYN Joiner    RADIOLOGIST: Marcelyn Ditty, MD    XR HIP LEFT W PELVIS 2-3 VIEWS performed on 01/05/2023 2:58 PM    CLINICAL HISTORY: fall, persistent left hip pain.  LT hip pain after fall    TECHNIQUE:  3 views of the left hip including AP pelvis.    COMPARISON:    01/05/2023 CT abdomen or pelvis  2021 left hip    FINDINGS:   No acute fracture or dislocation  Post fixation left intertrochanteric fracture 2021  Bones are diffusely osteopenic  Mild degenerative changes bilateral hips  Impression: Chronic changes. No acute findings         Radiologist location ID: [id number]  CT ABDOMEN PELVIS W IV CONTRAST  Narrative: Mariellen Maulding    RADIOLOGIST: Kelby Frame    CT ABDOMEN PELVIS W IV CONTRAST performed on 01/05/2023 12:02 PM    CLINICAL HISTORY: Left flank pain, worsening..  Left flank pain, worsening., hx left hip replacement, endometrial cancer, breast cancer, cholecystectomy, hysterectomy    TECHNIQUE:  Abdomen and pelvis CT with intravenous contrast.  IV CONTRAST: 75 ml's of Omnipaque 350    COMPARISON:  None.    FINDINGS:  Lung bases: Atelectatic densities are present. There is hiatal hernia present coronary calcifications are seen. No acute pleural disease  Liver:   Unremarkable.    Gallbladder:   Gallbladder surgically absent. No definite common duct dilatation or stone    Spleen:   Maximal length is around 13 cm which is  upper normal    Pancreas:   Unremarkable.    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 There is an associated calcification. Similar to the prior exam at around 2.1 cm.    Kidneys:   Low density renal cysts. Both kidneys are enhancing. No definite new perinephric findings. No obstructing stones or hydronephrosis detected.    Bladder:  Unremarkable.    Uterus and Adnexa:  Prior hysterectomy.  Adnexal regions are unremarkable.    Bowel:   Liquid stool mixing with solid in the colon. Some mildly distended fluid-filled small bowel loops are present. This pattern could represent enteritis in the appropriate clinical setting. Difficult to exclude wall thickening at the gastric antrum, suspicious for inflammatory disease. Endoscopy could be considered if not recently done. There is some questionable peripheral lucency at the distal aspect of the antrum concerning for possible ulcer formation in this area. There is no perigastric fluid or gas to indicate perforated ulceration.    Appendix:  No appendicitis pattern    Lymph nodes:  No suspicious lymph node enlargement.    Vasculature:   Calcified wall plaque at the aorta and major branches similar to the old exam. No aneurysm     Peritoneum / Retroperitoneum: No ascites.  No free air.    Bones:   Slight increase in inferior endplate compression deformity at L1 compared to the recent CT scan, correlate for any interval trauma. This is concerning for possible subtle compression fracture.  Impression: The bowel gas pattern is suspicious for possible enteritis which could be infectious  Gastritis with questionable gastric antral ulceration  Compression deformity of L1 not seen previously could indicate interval developing compression fracture. Further investigation with bone scan or MRI may be useful. (No bone retropulsion)  Nonspecific subcutaneous left mid and lower paraspinal haziness may represent edema or bruising. No large focal hematomas are seen      One or more dose reduction techniques were used (e.g., Automated exposure control, adjustment of the mA and/or kV according to patient size, use of iterative reconstruction technique).    Radiologist location ID: [id number]      Assessment/ Plan:   Active Hospital Problems   (*Primary Problem)    Diagnosis    *Acute hip pain, left    Fall at home, sequela    Urinary tract infection    Acute left-sided low back pain without sciatica    Abnormal urinalysis    Gastritis, presence of bleeding unspecified, unspecified chronicity, unspecified gastritis type    Hypomagnesemia     Acute left hip pain/low back pain post fall at home, sequela  -tele/pulse ox  -imaging to left hip/pelvis, not done prior  -muscle relaxer and oral pain medications  -PT to evaluate, pt agreeable to rehab if recommended     Urinary tract infection  -ABX prophylactic-Levaquin  -pending urine culture     Hypomagnesemia  -Replenish when low  -daily mag level     Other  -Diabetic diet  -SSI with coverage  -DNR    01/06/23- Mg+ low and replenished. Urine culture final pending. Working with PT and feeling better, less pain. Will need OP GI follow up for gastric antral ulceration.    01/07/23-Urine prelim showing gr - rods. Final pending. Mg+  low and replenished.  Hip/Pelvic XR negative for acute process. To be up and out of bed more often. Bedside chair. PT working with pt.     01/08/23-DC summary done.     01/09/23- DC held pending bowel movement. She had medication and suppository yesterday and had bowel movement today. Daughter asked to check mg level. If low will be replenished and then to proceed with discharge. Mg+ 1.6, will get riders x4, to keep appt in am since mag is still low      Nutrition:   DIET DIABETIC Calorie amount: CC 2000; Do you want to initiate MNT Protocol? Yes  DIETARY ORAL SUPPLEMENTS Oral Supplements with tray: Ensure Clear-Apple; BREAKFAST/DINNER; 1 Each    Energy Calorie Requirements: 1636 kcal/day (30 kcal/kg  IBW)  Protein Requirements (gms/day): 65 gm/day     Additional clinical characteristics related to nutrition:    - monitor for weight changes   - monitor intake and output    - monitor bowel functions     Nutrition Treatment Plan: Initiate oral supplement, Monitor weight trends  Lab Results   Component Value Date    ALBUMIN 2.5 (L) 01/06/2023         Disposition Planning:   home  The Hospitalist personally evaluated and examined the patient in conjunction with the MLP and agree with the assessments, treatment plan and disposition of the patient as recorded by the [facility].   Judene Companion, FNP-BC    [facility] MEDICINE HOSPITALIST

## 2023-01-10 ENCOUNTER — Ambulatory Visit (HOSPITAL_COMMUNITY): Payer: Medicare Other

## 2023-01-10 LAB — ADULT ROUTINE BLOOD CULTURE, SET OF 2 BOTTLES (BACTERIA AND YEAST)
BLOOD CULTURE, ROUTINE: NO GROWTH
BLOOD CULTURE, ROUTINE: NO GROWTH

## 2023-01-13 ENCOUNTER — Encounter (INDEPENDENT_AMBULATORY_CARE_PROVIDER_SITE_OTHER): Payer: Self-pay | Admitting: HEMATOLOGY-ONCOLOGY

## 2023-01-13 ENCOUNTER — Telehealth (INDEPENDENT_AMBULATORY_CARE_PROVIDER_SITE_OTHER): Payer: Self-pay | Admitting: HEMATOLOGY-ONCOLOGY

## 2023-01-13 ENCOUNTER — Other Ambulatory Visit (INDEPENDENT_AMBULATORY_CARE_PROVIDER_SITE_OTHER): Payer: Self-pay | Admitting: HEMATOLOGY-ONCOLOGY

## 2023-01-13 ENCOUNTER — Ambulatory Visit (HOSPITAL_COMMUNITY): Payer: Medicare Other

## 2023-01-13 DIAGNOSIS — C50919 Malignant neoplasm of unspecified site of unspecified female breast: Secondary | ICD-10-CM

## 2023-01-13 NOTE — Telephone Encounter (Signed)
Called speaking to the daughter and gave her the rescheduled pet on 11-7 12:45 and echo on 11-7 2:00

## 2023-01-13 NOTE — Care Management Notes (Signed)
Referral Information  ++++++ Placed Provider #1 ++++++  Case Manager: Ronald Hicks  Provider Type: Home Health  Provider Name: [facility]  Address:  [address]  [place], [place] [id number]  Contact: Missy McGuire    Phone: [phone] x  Fax:   Fax: [phone]

## 2023-01-15 ENCOUNTER — Encounter (INDEPENDENT_AMBULATORY_CARE_PROVIDER_SITE_OTHER): Payer: Self-pay | Admitting: HEMATOLOGY-ONCOLOGY

## 2023-01-16 ENCOUNTER — Other Ambulatory Visit (HOSPITAL_COMMUNITY): Payer: Medicare Other | Admitting: HEMATOLOGY-ONCOLOGY

## 2023-01-16 ENCOUNTER — Encounter (INDEPENDENT_AMBULATORY_CARE_PROVIDER_SITE_OTHER): Payer: Self-pay | Admitting: HEMATOLOGY-ONCOLOGY

## 2023-01-16 ENCOUNTER — Other Ambulatory Visit: Payer: Self-pay

## 2023-01-16 ENCOUNTER — Ambulatory Visit: Payer: Medicare Other | Attending: HEMATOLOGY-ONCOLOGY

## 2023-01-16 DIAGNOSIS — C50919 Malignant neoplasm of unspecified site of unspecified female breast: Secondary | ICD-10-CM

## 2023-01-16 DIAGNOSIS — N390 Urinary tract infection, site not specified: Secondary | ICD-10-CM | POA: Insufficient documentation

## 2023-01-16 LAB — COMPREHENSIVE METABOLIC PANEL, NON-FASTING
ALBUMIN/GLOBULIN RATIO: 1 (ref 0.8–1.4)
ALBUMIN: 3 g/dL — ABNORMAL LOW (ref 3.5–5.7)
ALKALINE PHOSPHATASE: 194 U/L — ABNORMAL HIGH (ref 34–104)
ALT (SGPT): 25 U/L (ref 7–52)
ANION GAP: 5 mmol/L (ref 4–13)
AST (SGOT): 34 U/L (ref 13–39)
BILIRUBIN TOTAL: 0.4 mg/dL (ref 0.3–1.0)
BUN/CREA RATIO: 15 (ref 6–22)
BUN: 13 mg/dL (ref 7–25)
CALCIUM, CORRECTED: 10.3 mg/dL (ref 8.9–10.8)
CALCIUM: 9.5 mg/dL (ref 8.6–10.3)
CHLORIDE: 105 mmol/L (ref 98–107)
CO2 TOTAL: 28 mmol/L (ref 21–31)
CREATININE: 0.87 mg/dL (ref 0.60–1.30)
ESTIMATED GFR: 67 mL/min/{1.73_m2} (ref >59)
GLOBULIN: 2.9 (ref 2.0–3.5)
GLUCOSE: 98 mg/dL (ref 74–109)
OSMOLALITY, CALCULATED: 276 mosm/kg (ref 270–290)
POTASSIUM: 4.2 mmol/L (ref 3.5–5.1)
PROTEIN TOTAL: 5.9 g/dL — ABNORMAL LOW (ref 6.4–8.9)
SODIUM: 138 mmol/L (ref 136–145)

## 2023-01-16 LAB — CBC WITH DIFF
BASOPHIL #: 0 10*3/uL (ref 0.00–0.10)
BASOPHIL %: 1 % (ref 0–1)
EOSINOPHIL #: 0 10*3/uL (ref 0.00–0.50)
EOSINOPHIL %: 1 % (ref 1–7)
HCT: 27.6 % — ABNORMAL LOW (ref 31.2–41.9)
HGB: 9.1 g/dL — ABNORMAL LOW (ref 10.9–14.3)
LYMPHOCYTE #: 1.5 10*3/uL (ref 1.00–3.00)
LYMPHOCYTE %: 32 % (ref 16–44)
MCH: 28.7 pg (ref 24.7–32.8)
MCHC: 33 g/dL (ref 32.3–35.6)
MCV: 86.8 fL (ref 75.5–95.3)
MONOCYTE #: 0.4 10*3/uL (ref 0.30–1.00)
MONOCYTE %: 9 % (ref 5–13)
MPV: 9 fL (ref 7.9–10.8)
NEUTROPHIL #: 2.8 10*3/uL (ref 1.85–7.80)
NEUTROPHIL %: 58 % (ref 43–77)
PLATELETS: 145 10*3/uL (ref 140–440)
RBC: 3.18 10*6/uL — ABNORMAL LOW (ref 3.63–4.92)
RDW: 18.8 % — ABNORMAL HIGH (ref 12.3–17.7)
WBC: 4.8 10*3/uL (ref 3.8–11.8)

## 2023-01-16 LAB — MAGNESIUM: MAGNESIUM: 1.3 mg/dL — ABNORMAL LOW (ref 1.9–2.7)

## 2023-01-16 NOTE — Nursing Note (Signed)
Per Selena Batten in infusion patient can have labs and electrolyte replacement next Thursday 11/7 per daughter's request.  Patient's daughter notified.  Appointment time not scheduled through epic at this time, daughter to check MyChart for appointment time on Thursday 11/7.

## 2023-01-16 NOTE — Nursing Note (Addendum)
Copied From CRM [id number].   [place] pt   Pt daughter is requesting that you order labs to be done by home health for her magnesium level, please call [phone] or fax to [phone]. She states the order needs to state through the port or vein. If she needs magnesium they can not administer it and would need to give meds by mouth. She is also requesting to cancel the appt for tomorrow. Please return her call .    I called and spoke with patient's daughter, Lupita Leash, she states patient was discharged from the hospital last Thursday and tested positive for COVID on Tuesday 11/29.  She is requesting labs for tomorrow be drawn by [facility] home health. She states patient was receiving magnesium daily in hospital but since discharge she has been taking over-the-counter slow Mag 1 tablet in the morning and 1 tablet in the evening.  She states patient is scheduled for echo and PET scan on 11/07 and requesting patient have blood drawn on same day instead of next Friday according to labs and electrolyte replacement treatment plan.  Secure chat sent to Kim in infusion.  Per daughter chemotherapy treatment plan is on hold at this time.  Daughter also states that appointment with Dr. Joslyn Devon was changed to 11/11.    I called and spoke with Colin Mulders at [facility] home health, she states they have patient scheduled for appointment this afternoon.  I informed them that the patient had COVID and tested positive 11/29.  Colin Mulders states they can draw labs today, I requested results be sent to our office.  Lab orders faxed to [facility] home health.  Kim in infusion notified that patient will have labs drawn today by [facility] home health and will not be in the infusion center tomorrow.

## 2023-01-17 ENCOUNTER — Encounter (INDEPENDENT_AMBULATORY_CARE_PROVIDER_SITE_OTHER): Payer: Self-pay | Admitting: HEMATOLOGY-ONCOLOGY

## 2023-01-17 ENCOUNTER — Ambulatory Visit (INDEPENDENT_AMBULATORY_CARE_PROVIDER_SITE_OTHER): Payer: Self-pay | Admitting: HEMATOLOGY-ONCOLOGY

## 2023-01-17 ENCOUNTER — Other Ambulatory Visit (INDEPENDENT_AMBULATORY_CARE_PROVIDER_SITE_OTHER): Payer: Self-pay | Admitting: HEMATOLOGY-ONCOLOGY

## 2023-01-17 ENCOUNTER — Ambulatory Visit
Admission: RE | Admit: 2023-01-17 | Discharge: 2023-01-17 | Disposition: A | Discharge status: Home / Self Care | Payer: Medicare Other | Source: Ambulatory Visit | Attending: HEMATOLOGY-ONCOLOGY

## 2023-01-17 ENCOUNTER — Ambulatory Visit: Payer: Medicare Other

## 2023-01-17 ENCOUNTER — Other Ambulatory Visit (INDEPENDENT_AMBULATORY_CARE_PROVIDER_SITE_OTHER): Payer: Self-pay

## 2023-01-17 MED ORDER — MAGNESIUM SULFATE 1 GRAM/100 ML IN DEXTROSE 5 % INTRAVENOUS PIGGYBACK
INJECTION | INTRAVENOUS | Status: AC
Start: 2023-01-17 — End: 2023-01-17
  Filled 2023-01-17: qty 400

## 2023-01-17 MED ORDER — MAGNESIUM SULFATE 1 GRAM/100 ML IN DEXTROSE 5 % INTRAVENOUS PIGGYBACK
1.0000 g | INJECTION | INTRAVENOUS | Status: AC
Start: 2023-01-17 — End: 2023-01-17
  Administered 2023-01-17: 0 g via INTRAVENOUS
  Administered 2023-01-17: 1 g via INTRAVENOUS
  Administered 2023-01-17 (×2): 0 g via INTRAVENOUS
  Administered 2023-01-17 (×2): 1 g via INTRAVENOUS
  Administered 2023-01-17: 0 g via INTRAVENOUS
  Administered 2023-01-17: 1 g via INTRAVENOUS

## 2023-01-17 NOTE — Nursing Note (Signed)
Called pt's dtr Lupita Leash to discuss if she would need w/c Zenaida Niece or stretcher if we called PRS to assist them; she states the family is going to get the patient there, she has been in PT and is getting stronger, she is only nervous about walking back up steps into the home on her return. They don't want PRS at this time.  Discussed how to get to ER/IV room, to register out front first at 1245, appt is at 1pm, will be there several hours and the patient needs to wear mask entire time; voiced understanding.

## 2023-01-17 NOTE — Nursing Note (Addendum)
Magnesium level 1.3. Patient is covid positive and unable to go to the infusion center, I spoke with Kelsey Preston in the ER IV room and they will be able to administer IV magnesium.  Per Kelsey Preston patient needs to arrive at 12:45 p.m. for a 1:00 p.m. appointment today.  She will need to check in with registration and wear a mask when entering the building and during the entire appointment.  Treatment plan updated for today and [facility] notified.  I called and spoke with patient's daughter, Lupita Leash, about appointment time, she states they may need ambulance transportation.  Daughter to speak with patient and I will return call to them shortly.

## 2023-01-18 ENCOUNTER — Other Ambulatory Visit (INDEPENDENT_AMBULATORY_CARE_PROVIDER_SITE_OTHER): Payer: Self-pay | Admitting: NURSE PRACTITIONER

## 2023-01-18 DIAGNOSIS — I82409 Acute embolism and thrombosis of unspecified deep veins of unspecified lower extremity: Secondary | ICD-10-CM

## 2023-01-20 ENCOUNTER — Encounter (INDEPENDENT_AMBULATORY_CARE_PROVIDER_SITE_OTHER): Payer: Self-pay | Admitting: HEMATOLOGY-ONCOLOGY

## 2023-01-20 NOTE — Nursing Note (Signed)
Called and informed patient and patient's daughter Lupita Leash of appointment for labs on 01/23/2023 at 7:45 AM with magnesium infusions to follow.  Reminded of appointment for PET and echo that day.  Patient and daughter verbalized agreement and understanding.

## 2023-01-21 ENCOUNTER — Encounter (INDEPENDENT_AMBULATORY_CARE_PROVIDER_SITE_OTHER): Payer: Self-pay | Admitting: HEMATOLOGY-ONCOLOGY

## 2023-01-23 ENCOUNTER — Inpatient Hospital Stay (HOSPITAL_COMMUNITY)
Admission: RE | Admit: 2023-01-23 | Discharge: 2023-01-23 | Disposition: A | Discharge status: Home / Self Care | Payer: Medicare Other | Source: Ambulatory Visit | Attending: HEMATOLOGY-ONCOLOGY | Admitting: HEMATOLOGY-ONCOLOGY

## 2023-01-23 ENCOUNTER — Other Ambulatory Visit: Payer: Self-pay

## 2023-01-23 ENCOUNTER — Encounter (HOSPITAL_COMMUNITY): Payer: Self-pay

## 2023-01-23 ENCOUNTER — Ambulatory Visit
Admission: RE | Admit: 2023-01-23 | Discharge: 2023-01-23 | Disposition: A | Discharge status: Home / Self Care | Payer: Medicare Other | Source: Ambulatory Visit | Attending: HEMATOLOGY-ONCOLOGY | Admitting: HEMATOLOGY-ONCOLOGY

## 2023-01-23 ENCOUNTER — Ambulatory Visit (INDEPENDENT_AMBULATORY_CARE_PROVIDER_SITE_OTHER): Payer: Self-pay

## 2023-01-23 ENCOUNTER — Ambulatory Visit (INDEPENDENT_AMBULATORY_CARE_PROVIDER_SITE_OTHER): Payer: Self-pay | Admitting: HEMATOLOGY-ONCOLOGY

## 2023-01-23 ENCOUNTER — Inpatient Hospital Stay (HOSPITAL_BASED_OUTPATIENT_CLINIC_OR_DEPARTMENT_OTHER)
Admission: RE | Admit: 2023-01-23 | Discharge: 2023-01-23 | Disposition: A | Discharge status: Home / Self Care | Payer: Medicare Other | Source: Ambulatory Visit | Attending: HEMATOLOGY-ONCOLOGY | Admitting: HEMATOLOGY-ONCOLOGY

## 2023-01-23 DIAGNOSIS — I083 Combined rheumatic disorders of mitral, aortic and tricuspid valves: Secondary | ICD-10-CM

## 2023-01-23 DIAGNOSIS — C50919 Malignant neoplasm of unspecified site of unspecified female breast: Secondary | ICD-10-CM

## 2023-01-23 DIAGNOSIS — C50612 Malignant neoplasm of axillary tail of left female breast: Secondary | ICD-10-CM | POA: Insufficient documentation

## 2023-01-23 DIAGNOSIS — Z08 Encounter for follow-up examination after completed treatment for malignant neoplasm: Secondary | ICD-10-CM

## 2023-01-23 LAB — COMPREHENSIVE METABOLIC PANEL, NON-FASTING
ALBUMIN/GLOBULIN RATIO: 1.1 (ref 0.8–1.4)
ALBUMIN: 3 g/dL — ABNORMAL LOW (ref 3.5–5.7)
ALKALINE PHOSPHATASE: 201 U/L — ABNORMAL HIGH (ref 34–104)
ALT (SGPT): 22 U/L (ref 7–52)
ANION GAP: 6 mmol/L (ref 4–13)
AST (SGOT): 35 U/L (ref 13–39)
BILIRUBIN TOTAL: 0.6 mg/dL (ref 0.3–1.0)
BUN/CREA RATIO: 16 (ref 6–22)
BUN: 12 mg/dL (ref 7–25)
CALCIUM, CORRECTED: 10.2 mg/dL (ref 8.9–10.8)
CALCIUM: 9.4 mg/dL (ref 8.6–10.3)
CHLORIDE: 109 mmol/L — ABNORMAL HIGH (ref 98–107)
CO2 TOTAL: 26 mmol/L (ref 21–31)
CREATININE: 0.73 mg/dL (ref 0.60–1.30)
ESTIMATED GFR: 83 mL/min/{1.73_m2} (ref >59)
GLOBULIN: 2.7 (ref 2.0–3.5)
GLUCOSE: 95 mg/dL (ref 74–109)
OSMOLALITY, CALCULATED: 281 mosm/kg (ref 270–290)
POTASSIUM: 3.4 mmol/L — ABNORMAL LOW (ref 3.5–5.1)
PROTEIN TOTAL: 5.7 g/dL — ABNORMAL LOW (ref 6.4–8.9)
SODIUM: 141 mmol/L (ref 136–145)

## 2023-01-23 LAB — CBC WITH DIFF
BASOPHIL #: 0 10*3/uL (ref 0.00–0.10)
BASOPHIL %: 1 % (ref 0–1)
EOSINOPHIL #: 0.1 10*3/uL (ref 0.00–0.50)
EOSINOPHIL %: 2 % (ref 1–7)
HCT: 28.5 % — ABNORMAL LOW (ref 31.2–41.9)
HGB: 9.7 g/dL — ABNORMAL LOW (ref 10.9–14.3)
LYMPHOCYTE #: 1.6 10*3/uL (ref 1.00–3.00)
LYMPHOCYTE %: 36 % (ref 16–44)
MCH: 29.1 pg (ref 24.7–32.8)
MCHC: 33.9 g/dL (ref 32.3–35.6)
MCV: 85.7 fL (ref 75.5–95.3)
MONOCYTE #: 0.3 10*3/uL (ref 0.30–1.00)
MONOCYTE %: 7 % (ref 5–13)
MPV: 8.1 fL (ref 7.9–10.8)
NEUTROPHIL #: 2.3 10*3/uL (ref 1.85–7.80)
NEUTROPHIL %: 54 % (ref 43–77)
PLATELETS: 161 10*3/uL (ref 140–440)
RBC: 3.32 10*6/uL — ABNORMAL LOW (ref 3.63–4.92)
RDW: 17.6 % (ref 12.3–17.7)
WBC: 4.3 10*3/uL (ref 3.8–11.8)

## 2023-01-23 LAB — MAGNESIUM: MAGNESIUM: 1 mg/dL — ABNORMAL LOW (ref 1.9–2.7)

## 2023-01-23 MED ORDER — MAGNESIUM SULFATE 1 GRAM/100 ML IN DEXTROSE 5 % INTRAVENOUS PIGGYBACK
INJECTION | INTRAVENOUS | Status: AC
Start: 2023-01-23 — End: 2023-01-23
  Filled 2023-01-23: qty 400

## 2023-01-23 MED ORDER — MAGNESIUM SULFATE 1 GRAM/100 ML IN DEXTROSE 5 % INTRAVENOUS PIGGYBACK
1.0000 g | INJECTION | INTRAVENOUS | Status: AC
Start: 2023-01-23 — End: 2023-01-23
  Administered 2023-01-23: 1 g via INTRAVENOUS
  Administered 2023-01-23 (×2): 0 g via INTRAVENOUS
  Administered 2023-01-23 (×3): 1 g via INTRAVENOUS
  Administered 2023-01-23 (×2): 0 g via INTRAVENOUS

## 2023-01-23 NOTE — Nurses Notes (Signed)
3086: Patient and husband to room 224 to await lab results. Reports severe back pain from recent fall-rated 5. Evans Lance, RN  0800: Right port accessed per protocol, labs drawn/sent.Evans Lance, RN  [phone] : A total of 4 gms macnesium given IV-each over 30 minutes per pharmacy. Evans Lance, RN  1140: Infusions complete.  Port de-accessed per policy. Evans Lance, RN  1150: Pt left via w/c with family. No complaints voiced other than back pain. Leaving to go have an echo and a PET scan.Evans Lance, RN

## 2023-01-23 NOTE — Telephone Encounter (Addendum)
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    Regarding: General Other  ----- Message from [place] C sent at 01/23/2023 12:50 PM EST -----  Copied From CRM #[id number].[place] pt   Please call Marcelino Duster concerning the echo she is scheduled for today, Marcelino Duster thinks it is too soon to have it. Pt is scheduled for 2pm today

## 2023-01-24 ENCOUNTER — Ambulatory Visit (HOSPITAL_COMMUNITY): Payer: Medicare Other

## 2023-01-25 DIAGNOSIS — I898 Other specified noninfective disorders of lymphatic vessels and lymph nodes: Secondary | ICD-10-CM

## 2023-01-25 DIAGNOSIS — K6389 Other specified diseases of intestine: Secondary | ICD-10-CM

## 2023-01-25 DIAGNOSIS — C50912 Malignant neoplasm of unspecified site of left female breast: Secondary | ICD-10-CM

## 2023-01-25 DIAGNOSIS — S32010D Wedge compression fracture of first lumbar vertebra, subsequent encounter for fracture with routine healing: Secondary | ICD-10-CM

## 2023-01-27 ENCOUNTER — Emergency Department
Admission: EM | Admit: 2023-01-27 | Discharge: 2023-01-27 | Disposition: A | Discharge status: Other Acute Inpatient Hospital | Payer: Medicare Other

## 2023-01-27 ENCOUNTER — Emergency Department (HOSPITAL_BASED_OUTPATIENT_CLINIC_OR_DEPARTMENT_OTHER): Payer: Medicare Other

## 2023-01-27 ENCOUNTER — Encounter (HOSPITAL_BASED_OUTPATIENT_CLINIC_OR_DEPARTMENT_OTHER): Payer: Self-pay

## 2023-01-27 ENCOUNTER — Ambulatory Visit: Payer: Medicare Other

## 2023-01-27 ENCOUNTER — Other Ambulatory Visit: Payer: Self-pay

## 2023-01-27 DIAGNOSIS — W07XXXA Fall from chair, initial encounter: Secondary | ICD-10-CM

## 2023-01-27 DIAGNOSIS — M48061 Spinal stenosis, lumbar region without neurogenic claudication: Secondary | ICD-10-CM | POA: Insufficient documentation

## 2023-01-27 DIAGNOSIS — S32010A Wedge compression fracture of first lumbar vertebra, initial encounter for closed fracture: Secondary | ICD-10-CM | POA: Insufficient documentation

## 2023-01-27 DIAGNOSIS — Z853 Personal history of malignant neoplasm of breast: Secondary | ICD-10-CM | POA: Insufficient documentation

## 2023-01-27 DIAGNOSIS — Z85118 Personal history of other malignant neoplasm of bronchus and lung: Secondary | ICD-10-CM | POA: Insufficient documentation

## 2023-01-27 DIAGNOSIS — R29890 Loss of height: Secondary | ICD-10-CM | POA: Insufficient documentation

## 2023-01-27 DIAGNOSIS — W182XXA Fall in (into) shower or empty bathtub, initial encounter: Secondary | ICD-10-CM | POA: Insufficient documentation

## 2023-01-27 DIAGNOSIS — Y93E1 Activity, personal bathing and showering: Secondary | ICD-10-CM

## 2023-01-27 LAB — COMPREHENSIVE METABOLIC PANEL, NON-FASTING
ALBUMIN/GLOBULIN RATIO: 0.8 (ref 0.8–1.4)
ALBUMIN: 2.7 g/dL — ABNORMAL LOW (ref 3.4–5.0)
ALKALINE PHOSPHATASE: 262 U/L — ABNORMAL HIGH (ref 46–116)
ALT (SGPT): 24 U/L (ref <=78)
ANION GAP: 6 mmol/L (ref 4–13)
AST (SGOT): 31 U/L (ref 15–37)
BILIRUBIN TOTAL: 0.7 mg/dL (ref 0.2–1.0)
BUN/CREA RATIO: 13
BUN: 12 mg/dL (ref 7–18)
CALCIUM, CORRECTED: 10.9 mg/dL
CALCIUM: 9.9 mg/dL (ref 8.5–10.1)
CHLORIDE: 103 mmol/L (ref 98–107)
CO2 TOTAL: 29 mmol/L (ref 21–32)
CREATININE: 0.96 mg/dL (ref 0.55–1.02)
ESTIMATED GFR: 60 mL/min/{1.73_m2} (ref >59)
GLOBULIN: 3.5
GLUCOSE: 130 mg/dL — ABNORMAL HIGH (ref 74–106)
OSMOLALITY, CALCULATED: 277 mosm/kg (ref 270–290)
POTASSIUM: 3.7 mmol/L (ref 3.5–5.1)
PROTEIN TOTAL: 6.2 g/dL — ABNORMAL LOW (ref 6.4–8.2)
SODIUM: 138 mmol/L (ref 136–145)

## 2023-01-27 LAB — PT/INR
INR: 2.14 — ABNORMAL HIGH (ref 0.84–1.10)
PROTHROMBIN TIME: 25 s — ABNORMAL HIGH (ref 9.8–12.7)

## 2023-01-27 LAB — CBC WITH DIFF
BASOPHIL #: 0.01 10*3/uL (ref 0.00–0.30)
BASOPHIL %: 0 % (ref 0–3)
EOSINOPHIL #: 0.07 10*3/uL (ref 0.00–0.80)
EOSINOPHIL %: 2 % (ref 1–7)
HCT: 34.9 % — ABNORMAL LOW (ref 37.0–47.0)
HGB: 10.9 g/dL — ABNORMAL LOW (ref 12.5–16.0)
LYMPHOCYTE #: 1.5 10*3/uL (ref 1.10–5.00)
LYMPHOCYTE %: 32 % (ref 25–45)
MCH: 28 pg (ref 27.0–32.0)
MCHC: 31.1 g/dL — ABNORMAL LOW (ref 32.0–36.0)
MCV: 89.9 fL (ref 78.0–99.0)
MONOCYTE #: 0.29 10*3/uL (ref 0.00–1.30)
MONOCYTE %: 6 % (ref 0–12)
MPV: 9 fL (ref 7.4–10.4)
NEUTROPHIL #: 2.86 10*3/uL (ref 1.80–8.40)
NEUTROPHIL %: 60 % (ref 40–76)
PLATELETS: 169 10*3/uL (ref 140–440)
RBC: 3.88 10*6/uL — ABNORMAL LOW (ref 4.20–5.40)
RDW: 18.8 % — ABNORMAL HIGH (ref 11.6–14.8)
WBC: 4.7 10*3/uL (ref 4.0–10.5)

## 2023-01-27 LAB — PTT (PARTIAL THROMBOPLASTIN TIME): APTT: 41.3 s — ABNORMAL HIGH (ref 25.0–38.0)

## 2023-01-27 MED ORDER — TRAMADOL 50 MG TABLET
ORAL_TABLET | ORAL | Status: AC
Start: 2023-01-27 — End: 2023-01-27
  Filled 2023-01-27: qty 1

## 2023-01-27 MED ORDER — METHYLPREDNISOLONE ACETATE 80 MG/ML SUSPENSION FOR INJECTION
INTRAMUSCULAR | Status: AC
Start: 2023-01-27 — End: 2023-01-27
  Filled 2023-01-27: qty 1

## 2023-01-27 MED ORDER — CYCLOBENZAPRINE 10 MG TABLET
ORAL_TABLET | ORAL | Status: AC
Start: 2023-01-27 — End: 2023-01-27
  Filled 2023-01-27: qty 1

## 2023-01-27 MED ORDER — CYCLOBENZAPRINE 10 MG TABLET
5.0000 mg | ORAL_TABLET | ORAL | Status: AC
Start: 2023-01-27 — End: 2023-01-27
  Administered 2023-01-27: 5 mg via ORAL

## 2023-01-27 MED ORDER — METHYLPREDNISOLONE ACETATE 80 MG/ML SUSPENSION FOR INJECTION
80.0000 mg | Freq: Once | INTRAMUSCULAR | Status: AC
Start: 2023-01-27 — End: 2023-01-27
  Administered 2023-01-27: 80 mg via INTRAMUSCULAR

## 2023-01-27 MED ORDER — TRAMADOL 50 MG TABLET
50.0000 mg | ORAL_TABLET | ORAL | Status: AC
Start: 2023-01-27 — End: 2023-01-27
  Administered 2023-01-27: 50 mg via ORAL

## 2023-01-27 NOTE — ED Notes (Signed)
[facility] contacted to transport patient to [facility] ED at this time

## 2023-01-27 NOTE — ED Nurses Note (Signed)
Patient wants to wait to take pain medicine until she can eat. Provider notified.

## 2023-01-27 NOTE — ED Provider Notes (Signed)
[facility]  ED Primary Provider Note  Patient Name: Kelsey Preston  Patient Age: 82 y.o.  Date of Birth: 03-20-1940    Chief Complaint: Back Pain        History of Present Illness       Kelsey Preston is a 82 y.o. female who had concerns including Back Pain.  Patient presents to ED via EMS who states that she fell approximately 1 hour ago.  Patient reports that she was in the shower chair when it tipped over causing her to fall to the floor.  Patient denies striking her head or loss of consciousness.  She states that she does have pain in lower back.  Patient also reports history of breast and lung cancer and that she recently finished chemo weeks ago.  Patient currently denies any chest pain shortness breath nausea vomiting fever chills.      History provided by:  Patient  Back Pain  Location:  Lumbar spine  Quality:  Shooting and aching  Radiates to:  Does not radiate  Pain severity:  Moderate  Onset quality:  Sudden  Duration:  1 hour  Timing:  Constant  Progression:  Unchanged  Context: falling    Relieved by:  None tried  Worsened by:  Nothing  Ineffective treatments:  None tried  Associated symptoms: no bladder incontinence, no bowel incontinence, no paresthesias and no perianal numbness    Risk factors: hx of cancer         Review of Systems     No other overt Review of Systems are noted to be positive except noted in the HPI.      Historical Data   History Reviewed This Encounter: Medical History  Surgical History  Family History  Social History      Physical Exam   ED Triage Vitals [01/27/23 1146]   BP (Non-Invasive) (!) 164/88   Heart Rate 75   Respiratory Rate 16   Temperature 36.3 C (97.3 F)   SpO2 96 %   Weight 87.5 kg (193 lb)   Height 1.626 m (5\' 4" )         General: WDWN, appears stated age, alert in NAD. Cooperative throughout the exam.  Eyes:  no orbital edema or erythema, pupils equal, EOMI, no scleral icterus or conjunctival injection,  Neck: Supple and without  LAD  CV: RRR, crisp S1, S2, no extra heart sounds,   Resp: LCTAB, no respiratory distress  MSK: moves all extremities.  Pain noted in lumbar area back.  Skin: warm and intact, of normal color, no lesions noted  Neuro: normal tone, no involuntary movements, GCS 4,5,6, patient denies any saddle anesthesia or loss of bladder or bowel control.  Psych: speech and affect are appropriate, thought processes intact , insight and judgement appropriate      Patient Data     Labs Ordered/Reviewed   COMPREHENSIVE METABOLIC PANEL, NON-FASTING - Abnormal; Notable for the following components:       Result Value    ALBUMIN 2.7 (*)     GLUCOSE 130 (*)     ALKALINE PHOSPHATASE 262 (*)     PROTEIN TOTAL 6.2 (*)     All other components within normal limits    Narrative:     Estimated Glomerular Filtration Rate (eGFR) is calculated using the CKD-EPI (2021) equation, intended for patients 70 years of age and older. If gender is not documented or "unknown", there will be no eGFR calculation.   PT/INR -  Abnormal; Notable for the following components:    PROTHROMBIN TIME 25.0 (*)     INR 2.14 (*)     All other components within normal limits    Narrative:     In the setting of warfarin therapy, a moderate-intensity INR goal range is 2.0 to 3.0 and a high-intensity INR goal range is 2.5 to 3.5.    INR is ONLY validated to determine the level of anticoagulation with vitamin K antagonists (warfarin). Other factors may elevate the INR including but not limited to direct oral anticoagulants (DOACs), liver dysfunction, vitamin K deficiency, DIC, factor deficiencies, and factor inhibitors.   PTT (PARTIAL THROMBOPLASTIN TIME) - Abnormal; Notable for the following components:    APTT 41.3 (*)     All other components within normal limits   CBC WITH DIFF - Abnormal; Notable for the following components:    RBC 3.88 (*)     HGB 10.9 (*)     HCT 34.9 (*)     MCHC 31.1 (*)     RDW 18.8 (*)     All other components within normal limits   CBC/DIFF     Narrative:     The following orders were created for panel order CBC/DIFF.  Procedure                               Abnormality         Status                     ---------                               -----------         ------                     CBC WITH ZHYQ[[id number]]                Abnormal            Final result                 Please view results for these tests on the individual orders.       CT LUMBAR SPINE WO IV CONTRAST   Final Result by Edi, Radresults In (11/11 1557)   NEW COMPRESSION FRACTURE OF L1 WITH 30% LOSS OF HEIGHT AND MILD SPINAL CANAL NARROWING.         One or more dose reduction techniques were used (e.g., Automated exposure control, adjustment of the mA and/or kV according to patient size, use of iterative reconstruction technique).         Radiologist location ID: [id number]         XR LUMBAR SPINE SERIES   Final Result by Edi, Radresults In (11/11 1241)   L1 VERTEBRAL BODY FRACTURE               Radiologist location ID: [id number]         XR ANKLE RIGHT   Final Result by Edi, Radresults In (11/11 1240)   SOFT TISSUE SWELLING. NO  FRACTURE IDENTIFIED.                   Radiologist location ID: [id number]             Medical Decision Making  Medical Decision Making  Amount and/or Complexity of Data Reviewed  Labs: ordered.  Radiology: ordered. Decision-making details documented in ED Course.    Risk  Prescription drug management.          Studies Assessed and/or Ordered:  CBC CMP PT INR PTT x-ray right ankle x-ray lumbar spine CT lumbar spine.      MDM Narrative:    Compression fracture L1 lumbar vertebrae.  Patient presents to ED via EMS who states that she fell approximately 1 hour ago.  Patient reports that she was in the shower chair when it tipped over causing her to fall to the floor.  Patient denies striking her head or loss of consciousness.  She states that she does have pain in lower back.  Patient also reports history of breast and lung cancer and that she  recently finished chemo weeks ago.  Patient currently denies any chest pain shortness breath nausea vomiting fever chills.  CBC shows hemoglobin 10.9 hematocrit 34.9, however, this appears to be improvement from 4 days ago.  CMP shows albumin 2.7 glucose 130 alkaline phosphate 262.  PTT 25.0 INR 2.14.  PTT 41.3.  X-ray right ankle shows soft tissue swelling with no identifiable fracture, normal alignment.  X-ray lumbar spine shows there is in L1 vertebral body fracture with about 40% loss of height.  CT lumbar spine shows new compression fracture of L1 with 30% loss height and mild spinal canal narrowing.  When compared to previous studies this appears to be new.  Patient received tramadol 50 mg Flexeril 5 mg Depo-Medrol 80 mg.  Contacted and consulted with [facility]  They are agreeable to accept the patient in the emergency department.  Patient will be accepted by attending physician Dr. Ashok Norris.  Discussion held with the patient and her family at bedside concerning examined lab findings they are agreeable to current treatment plan.  Patient will be transferred to [facility] Emergency Department for further evaluation and treatment.        ED Course as of 01/27/23 1718   Mon Jan 27, 2023   1253 XR LUMBAR SPINE SERIES  FINDINGS:  There is an L1 vertebral body fracture with about 40% loss of height. This could be an acute finding.  Disc space heights are preserved.     Multilevel facet joint narrowing.  Normal alignment.  No spondylolisthesis.     Arthritic changes in both sacroiliac joints right worse than left.        IMPRESSION:  L1 VERTEBRAL BODY FRACTURE     1254 XR ANKLE RIGHT  FINDINGS:   No visible fracture.  No suspicious bone lesion.  Normal alignment.  Mortise appears intact.  No effusion.  Soft tissue swelling.  There is marked osteopenia which does limit evaluation.     IMPRESSION:  SOFT TISSUE SWELLING. NO  FRACTURE IDENTIFIED.     1501 Spoke with Dr. Sherryle Lis who  recommends the patient follow up at another facility as they are services do not include spine.   1606 CT LUMBAR SPINE WO IV CONTRAST  FINDINGS:  Vertebrae:  There is a new compression fracture of L1 when compared to the previous study. There is approximately 30% loss of height. There is mild spinal canal narrowing at the L1-2 disc space     Alignment:  No spondylolisthesis.        Sacrum:  Visualized upper sacrum and SI joints are unremarkable.     Vascular calcifications. Left renal cyst similar to  the prior study.        IMPRESSION:  NEW COMPRESSION FRACTURE OF L1 WITH 30% LOSS OF HEIGHT AND MILD SPINAL CANAL NARROWING           Medications Administered in the ED   traMADol (ULTRAM) tablet (50 mg Oral Given 01/27/23 1609)   cyclobenzaprine (FLEXERIL) tablet (5 mg Oral Given 01/27/23 1458)   methylPREDNISolone acetate (DEPO-medrol) 80 mg/mL injection (80 mg IntraMUSCULAR Given 01/27/23 1458)       Patient will be transferred [facility] for further workup and management.    Disposition: Transfered to Another Facility                   Clinical Impression   Compression fracture of L1 lumbar vertebra, closed, initial encounter (CMS HCC) (Primary)         Current Discharge Medication List            /M. Surah Pelley, FNP-BC  Department of Emergency Medicine  [facility]

## 2023-01-27 NOTE — ED Nurses Note (Signed)
Report called to [facility] at [facility] ED. [facility] transporting patient at this time.

## 2023-01-27 NOTE — ED Triage Notes (Addendum)
Ems reports pt fell one hour ago, pt was on a shower chair and it tipped over, pt slid to floor. Pt denies hitting head or loss of consciousness. pt states she has long standing hx lower back pain. Pain c/o pain mid lumbar area. Ems reports pt ambulatory at scene with assistance which is normal for her. Hx breast and lung cancer, finished chemo 3 weeks ago

## 2023-01-27 NOTE — ED Nurses Note (Signed)
Patient resting in bed with family at bedside. No needs or concerns at this time. Awaiting transport at this time.

## 2023-01-29 ENCOUNTER — Ambulatory Visit (HOSPITAL_COMMUNITY): Payer: Medicare Other

## 2023-01-29 ENCOUNTER — Ambulatory Visit (INDEPENDENT_AMBULATORY_CARE_PROVIDER_SITE_OTHER): Payer: Self-pay | Admitting: HEMATOLOGY-ONCOLOGY

## 2023-01-29 ENCOUNTER — Ambulatory Visit (INDEPENDENT_AMBULATORY_CARE_PROVIDER_SITE_OTHER): Payer: Self-pay

## 2023-01-31 ENCOUNTER — Other Ambulatory Visit (HOSPITAL_COMMUNITY)
Admit: 2023-01-31 | Discharge: 2023-01-31 | Disposition: A | Discharge status: Home / Self Care | Payer: Self-pay | Admitting: EXTERNAL

## 2023-01-31 NOTE — Discharge Summary (Signed)
Kelsey Preston  82 y.o.  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 Work up at OSH was notable for L1 vertebral body fracture. Transferred for NSGY evaluation. NSGY recommends TLSO for comfort with nonsurgical management with PT/OT and pain control. She has not required narcotics but mainly tylenol for pain mangement . She has worked well with PT and OT . Her magnesium is chronically low and had to receive IV magsuphate to supplement it along side her po slow mag which has been increased to tid dosing. Please check Magnesium weekly.   Her BP is elevated and her home antihypertensives had been stopped. I resumed cozaar 100mg  qd with improved control.   Patient has a supportive family at bedside who will continue to assist in her care.   MOM has been added for constipation treatment.     INSTRUCTIONS:        Future Appointments   Date Time Provider Department Center   03/13/2023  9:30 AM DX IMAGING [facility] 1ST FLOOR [facility] [place]   03/13/2023 10:00 AM Reino Kent, FNP [facility] [place]         DISCHARGE MEDICATIONS:       Medication List        START taking these medications      * losartan 50 mg Tab  Commonly known as:  COZAAR  take 1 tablet every day by mouth .     * losartan 100 mg Tab  Commonly known as: COZAAR  take 1 tablet every day by mouth .  Start taking on: February 01, 2023     magnesium hydroxide 400 mg/5 mL Susp  Commonly known as: MILK OF MAGNESIA  take 30 mL every day as needed by mouth  for Other (constipation).          * * This list has 2 medication(s) that are the same as other medications prescribed for you. Read the directions carefully, and ask your doctor or other care provider to review them with you.                CHANGE how you take these medications      Slow-Mag 71.5 mg Tbec  Generic drug: magnesium chloride  take 1 tablet in the morning and 1 tablet at noon and 1 tablet before bedtime by mouth.  What changed:   medication strength  when to take this            CONTINUE taking these medications      acetaminophen 500 mg Tab  Commonly known as: TYLENOL  take 1 tablet by mouth every 4 hours as needed for Pain     atorvastatin  40 mg Tab  Commonly known as: LIPITOR  take 40 mg every night by mouth .     carvediloL 6.25 mg Tab  Commonly known as: COREG  take 6.25 mg in the morning and 6.25 mg before bedtime by mouth.     ESOMEPRAZOLE 40 mg Cpdr  Commonly known as: NexIUM  take 40 mg by mouth every day.     ezetimibe 10 mg Tab  Commonly known as: ZETIA  take 10 mg every day by mouth .     famotidine 40 mg Tab  Commonly known as: PEPCID  take 1 tablet every evening by mouth .     Geriatric Multivit w/Iron-Min Tab  Commonly known as: CENTRUM SILVER  take 1 Tab by mouth every day.     metFORMIN-XR 500 mg Tb24  Commonly known as: GLUCOPHAGE-XR  take 500 mg in the morning and 500 mg before bedtime by mouth.     methocarbamoL 500 mg Tab  Commonly known as: ROBAXIN  take 1,000 mg four times daily as needed by mouth .     ondansetron HCL 4 mg Tab  Commonly known as: ZOFRAN  take 4 mg every 8 (eight) hours as needed by mouth .     sertraline 50 mg Tab  Commonly known as: ZOLOFT  take 50 mg by mouth every day.     Vit   A,C,E-Zinc-Copper 4,296 mcg-226 mg-90 mg Cap  take 1 capsule by mouth two times daily     Xarelto 20 mg Tab  Generic drug: rivaroxaban  take 20 mg every evening by mouth .            STOP taking these medications      Cholecalciferol (Vitamin D3) 50 mcg (2,000 unit) Tab     ferrous sulfate 325 mg (65 mg iron) Tab     furosemide 20 mg Tab  Commonly known as: LASIX     gabapentin 300 mg Cap  Commonly known as: NEURONTIN     magnesium oxide 400 mg (241.3 mg magnesium) Tab  Commonly known as: MAG-OX     phosphorus 250 mg Tab  Commonly known as: K PHOS NEUTRAL     PravachoL 40 mg Tab  Generic drug: pravastatin     telmisartan 80 mg Tab  Commonly known as: MICARDIS     therapeutic multivitamin-minerals Tab  Commonly known as: THERAGRAN-M     traMADol 50 mg Tab  Commonly known as: ULTRAM     Vitamin C 500 mg Tab  Generic drug: ascorbic acid               Where to Get Your Medications        These medications were sent to [facility], [place] - [address]  [address], [place] [address]      Phone: [phone]   losartan 50 mg Tab       Information about where to get these medications is not yet available    Ask your nurse or doctor about these medications  losartan 100 mg Tab  magnesium hydroxide 400 mg/5 mL Susp  Slow-Mag 71.5 mg Tbec       Mag= 1.6    DISCHARGE EXAM:  BP 152/77 RR 18  P 78 Temp 97.7   General : Elderly alopecia  white female wearing TLSO brace appears to be comfortable and in no distress  Neck: Supple, No Lymphadenopathy, No Thyromegaly, No JVD                         Chest /lung: Bilateral clear to auscultation, No rhonchi, No wheezing  Heart:  Normal S1, S2.  No murmur, gallop, click or rub.    Abdomen: soft, Non tender, Non distended, Active bowel sounds. No palpable organomegaly or masses. No rigidity, No guarding, direct, or rebound tenderness.  Neuro: AAO times 3 . No focal neurological deficit  Extremities: No deformity, no edema.                    [facility] Hospitalist [facility]  [facility]  [address]  [place], [place] [address]  [phone]     *Some images could not be shown.

## 2023-01-31 NOTE — Progress Notes (Signed)
Physical Therapy - Acute Care  Treatment Note    Patient Name: Kelsey Preston Treatment Date: 01/31/2023   MRN: [id number] Age: 82 y.o.   Date of Birth: February 16, 1941 Admit Date: 01/28/2023  3:31 AM   Admission Dx: Lumbar compression fracture, closed, initial encounter (HCC)   Height: Height: 162.6 cm (5\' 4" ) Weight: Weight: 89 kg (196 lb 3.2 oz)   Room/Bed: Unit:   10W [facility]   Room:1017   Bed:   1017 A    Precautions: Falls, Back (TLSO for comfort) Activity Level: Per MD protocol   Isolation Precautions: None   Weight-bearing Status: no weight bearing restrictions     Discharge Recommendations:    Durable Medical Equipment needs: defer to next facility  Therapy Follow-Up: Post-acute Rehab   Other recommendations: assistance for safety, physical assist for mobility, intermittent supervision, assist with activities of daily living, assist with IADLs, and recommend ambulance transport for patient safety    Mobility Summary:   Bed Mobility Analysis: Supine to sit: Min Assist, instructional cueing, extra time, L edge of bed, log rolling, HOB elevated, needs assist for lifting/lowering trunk  Sit to supine: Min Assist, instructional cueing, extra time, L edge of bed, log rolling, needs assist to manage lower extremities   Transfer Analysis: Sit to stand: Contact guard, instructional cueing, extra time from bed height with Rolling walker   Stand to sit: Contact guard, instructional cueing, extra time to bed height with Rolling walker   Gait Analysis: Ambulation Distance: 135 feet  Assistive Device:Rolling walker  Assistance Required: Contact guard  Gait Deviations: step-through gait pattern, decreased cadence, mild instability, demonstrates ability to negotiate hallway obstacles, stooped posture, and shuffling     SUBJECTIVE: Pt agreeable to session     OBJECTIVE: Chart reviewed prior to session.    Observations: Pt supine in bed upon arrival  Patient identity confirmed using 2 patient identifiers: No  Additional Observations:  caregiver/family present, alarm off  Attachments: peripheral IV and TAP system, TLSO  Pain Level (NPRS): Patient did not rate pain  Mental Status:   Affect/Mental Status: WFL  Behavioral Issues: none  Command Following: able to follow multi-step instructions  Orientation: oriented x 4  Communication: intact    Mobility Analysis: See Mobility Summary Above.    ROM/Therapeutic Exercise:   Ankle Pumps: BLE x 10 reps, 2 sets , seated  Gluteal Sets: seated, 1x10  Long Arc Quads: BLE x 10 reps, 1 set, seated  Marching: BLE x 10 reps, 1 set, seated  Hip Abduction/Adduction: BLE x 10 reps, 1 set, seated    Balance Assessment:  Seated: SBA static sitting EOB; MinA dynamic sitting EOB  Standing: CGA while using RW    Activity Tolerance/Cardiopulmonary: vital signs stable    Patient/Family Education: role of Physical Therapy, discharge recommendations, mobility/transfer safety, fall prevention, assistive devices, back precautions, logrolling, orthotic use, and proper positioning  Interdisciplinary Contacts: Discussed patient status with RN, Denny Peon  Patient left: in bed, items in reach, nurse aware, and family present    AM-PACT: Basic Mobility Inpatient Short Form  AM-PAC Basic Mobility: How much difficulty does the patient currently have  Turning over in bed (including adjusting bedclothes, sheets & blankets): A Little  Sitting down on & standing up from a chair w/arms (e.g. WC, BSC, etc): A Little  Moving from lying on back to sitting on the side of the bed: A Little  AM-PAC Basic Mobility: How much help from another person does the patient currently need  Moving  to & from a bed to a chair (including WC): A Little  Need to walk in hospital room: A Little  Climbing 3-5 steps with a railing: A Little  AM-PAC Basic Mobility Scores  AM-PAC Basic Mobility Raw Score: 18    Lower raw score indicates greater impairment or disability.     ASSESSMENT:   Patient is making good progress towards goals.    PLAN:                   Frequency:   5-6 times per week until discharged from acute care or when goals are met.     Therapist: Eulas Post, PTA                Date: 01/31/2023  Start/Stop time: 1478-2956                       Total time: 34 minutes

## 2023-01-31 NOTE — Consults (Signed)
Occupational Therapy - Acute Care Treatment Note    Patient Name: Kelsey Preston Treatment Date: 01/31/2023   Date of Birth: [birth year] Age: 82 y.o.   MRN: [id number] Admit Date: 01/28/2023  3:31 AM   Admission Dx: Lumbar compression fracture, closed, initial encounter (HCC)   Room/Bed: Unit:   10W [facility]   Room:1017   Bed:   1017 A    Precautions: Falls, Back, TLSO Activity Level: Activity per BMAT protocol   Isolation Precautions: None, PPE Worn: surgical mask, gloves   Weight-bearing Status: no weight bearing restrictions     Discharge Recommendations:  Durable Medical Equipment needs: defer to next facility, to be determined  Therapy Follow-Up: Post-acute Rehab   Other recommendations: assistance for safety, physical assist for mobility, intermittent supervision, assist with activities of daily living, and assist with IADLs    Current Functional Status:   Self-Feeding: Independent, set-up   Grooming: Washing face/hands: Contact guard, instructional cueing, extra time, set-up, standing  Oral care: Min Assist, instructional cueing, extra time, set-up, standing   Dressing: Upper body: Min Assist, Mod Assist, instructional cueing, extra time, set-up, sitting edge of bed, hospital gown, brace/orthosis  Lower body: Don/Doff Socks: Max Assist, instructional cueing, extra time, set-up, sitting edge of bed   Toileting: Toilet Transfer: Sit to stand: Mod Assist, instructional cueing, extra time, set-up from commode height with Rolling walker   Stand to sit: Min Assist, instructional cueing, extra time, set-up to commode height with Rolling walker   Bed Mobility: Supine to sit: Min Assist, instructional cueing, extra time, set-up, log rolling, needs assist for lifting/lowering trunk, needs assist to manage lower extremities  Scooting: Contact guard   Transfers/Mobility: Sit to stand: Mod Assist, instructional cueing, extra time, set-up from bed height with Rolling walker   Stand to sit: Min Assist, instructional cueing, extra  time, set-up to chair height and use of armrests with Rolling walker  Ambulation in room: Contact guard, Min Assist, instructional cueing, extra time, set-up with Rolling walker   ADL Comments:  Pt initially CGA during standing ADLs, however as session progressed, pt fatigued with BLE weakness requiring min A for standing tasks and for ambulation back to chair.     SUBJECTIVE: "I got a call that I am going to [place]"    OBJECTIVE:     Observations: Pt supine in bed, HOB elevated   Patient wearing mask during entire session: Yes  Patient identity confirmed using 2 patient identifiers: Yes  Additional Observations: caregiver/family present  Attachments: peripheral IV, TLSO donned EOB  Pain Level (NPRS):  not rated, managed during session  Cognitive:   Level of Consciousness: alert  Command following: able to follow multi-step instructions  Orientation: 4  Communication: intact  Memory: WFL  Safety: impaired safety precautions awareness/follow-through  Behavioral Issues: none      Balance:  Seated (Static): sup  Seated (Dynamic): SBA  Standing (Static): CGA-min A  Standing (Dynamic): min A    Activity Tolerance/Cardiopulmonary: no signs of intolerance, vital signs stable      Patient/Family Education: role of Occupational Therapy, adaptive equipment, ADL compensatory strategies, assistive devices, back precautions, discharge recommendations, energy conservation, fall prevention, mobility/transfer safety, orthotic use  Interdisciplinary Contacts:  Discussed patient status with RN  Patient left: in chair, items in reach, lines intact, nurse aware, nursing aware, family present, and affected extremity elevated    AM-PACT: Daily Activity Inpatient Short Form  AM-PAC Daily Activity: How much help from another person does the patient currently need  Putting on & taking off regular lower body clothing: A Lot  Bathing (including washing, rinsing, drying): A Lot  Toileting, which includes using toilet, bedpan or urinal: A  Little  Putting on & taking off regular upper body clothing: None  Taking care of personal grooming such as brushing teeth: None  Eating meals: None  AM-PAC Daily Activity Scores  AM-PAC Daily Activity Raw Score: 19    Lower raw score indicates greater impairment or disability.     ASSESSMENT:  Pt is progressing towards her goals. Continue skilled OT in the acute and postacute setting to address deficits and maximize (I) and safety with ADL/IADLs/mobility.     Rehab Potential: Good to achieve stated goals                                                                     PLAN:   Plan of Care discussed with patient/family: Yes  Frequency: 4 times per week    Therapist: Earl Lagos, OTR                Date: 01/31/2023  Start/Stop time: 1028/1057                       Total time: 29

## 2023-02-01 ENCOUNTER — Other Ambulatory Visit (HOSPITAL_COMMUNITY): Payer: Self-pay | Admitting: EXTERNAL

## 2023-02-01 LAB — CBC/DIFF - CLIENT CONSOLIDATED
BASOPHIL #: 0 10*3/uL (ref 0.00–0.10)
BASOPHIL %: 1 % (ref 0–1)
EOSINOPHIL #: 0.1 10*3/uL (ref 0.00–0.50)
EOSINOPHIL %: 3 % (ref 1–7)
HCT: 29.8 % — ABNORMAL LOW (ref 31.2–41.9)
HGB: 9.7 g/dL — ABNORMAL LOW (ref 10.9–14.3)
LYMPHOCYTE #: 1.6 10*3/uL (ref 1.00–3.00)
LYMPHOCYTE %: 45 % — ABNORMAL HIGH (ref 16–44)
MCH: 28.3 pg (ref 24.7–32.8)
MCHC: 32.6 g/dL (ref 32.3–35.6)
MCV: 86.8 fL (ref 75.5–95.3)
MONOCYTE #: 0.3 10*3/uL (ref 0.30–1.00)
MONOCYTE %: 9 % (ref 5–13)
MPV: 9.7 fL (ref 7.9–10.8)
NEUTROPHIL #: 1.5 10*3/uL — ABNORMAL LOW (ref 1.85–7.80)
NEUTROPHIL %: 42 % — ABNORMAL LOW (ref 43–77)
PLATELETS: 115 10*3/uL — ABNORMAL LOW (ref 140–440)
RBC: 3.43 10*6/uL — ABNORMAL LOW (ref 3.63–4.92)
RDW: 17.8 % — ABNORMAL HIGH (ref 12.3–17.7)
WBC: 3.5 10*3/uL
WBC: 3.5 10*3/uL — ABNORMAL LOW (ref 3.8–11.8)

## 2023-02-01 LAB — CBC WITH DIFF
BASOPHIL #: 0 10*3/uL (ref 0.00–0.10)
BASOPHIL %: 1 % (ref 0–1)
EOSINOPHIL #: 0.1 10*3/uL (ref 0.00–0.50)
EOSINOPHIL %: 3 % (ref 1–7)
HCT: 29.8 % — ABNORMAL LOW (ref 31.2–41.9)
HGB: 9.7 g/dL — ABNORMAL LOW (ref 10.9–14.3)
LYMPHOCYTE #: 1.6 10*3/uL (ref 1.00–3.00)
LYMPHOCYTE %: 45 % — ABNORMAL HIGH (ref 16–44)
MCH: 28.3 pg (ref 24.7–32.8)
MCHC: 32.6 g/dL (ref 32.3–35.6)
MCV: 86.8 fL (ref 75.5–95.3)
MONOCYTE #: 0.3 10*3/uL (ref 0.30–1.00)
MONOCYTE %: 9 % (ref 5–13)
MPV: 9.7 fL (ref 7.9–10.8)
NEUTROPHIL #: 1.5 10*3/uL — ABNORMAL LOW (ref 1.85–7.80)
NEUTROPHIL %: 42 % — ABNORMAL LOW (ref 43–77)
PLATELETS: 115 10*3/uL — ABNORMAL LOW (ref 140–440)
RBC: 3.43 10*6/uL — ABNORMAL LOW (ref 3.63–4.92)
RDW: 17.8 % — ABNORMAL HIGH (ref 12.3–17.7)
WBC: 3.5 10*3/uL — ABNORMAL LOW (ref 3.8–11.8)

## 2023-02-01 LAB — HGA1C (HEMOGLOBIN A1C WITH EST AVG GLUCOSE): HEMOGLOBIN A1C: 5.2 % (ref 4.0–6.0)

## 2023-02-01 LAB — COMPREHENSIVE METABOLIC PANEL, NON-FASTING
ALBUMIN/GLOBULIN RATIO: 1.1 (ref 0.8–1.4)
ALBUMIN: 2.9 g/dL — ABNORMAL LOW (ref 3.5–5.7)
ALKALINE PHOSPHATASE: 178 U/L — ABNORMAL HIGH (ref 34–104)
ALT (SGPT): 22 U/L (ref 7–52)
ANION GAP: 4 mmol/L (ref 4–13)
AST (SGOT): 36 U/L (ref 13–39)
BILIRUBIN TOTAL: 0.6 mg/dL (ref 0.3–1.0)
BUN/CREA RATIO: 17 (ref 6–22)
BUN: 14 mg/dL (ref 7–25)
CALCIUM, CORRECTED: 11.2 mg/dL — ABNORMAL HIGH (ref 8.9–10.8)
CALCIUM: 10.3 mg/dL (ref 8.6–10.3)
CHLORIDE: 104 mmol/L (ref 98–107)
CO2 TOTAL: 32 mmol/L — ABNORMAL HIGH (ref 21–31)
CREATININE: 0.81 mg/dL (ref 0.60–1.30)
ESTIMATED GFR: 73 mL/min/{1.73_m2} (ref >59)
GLOBULIN: 2.6 (ref 2.0–3.5)
GLUCOSE: 121 mg/dL — ABNORMAL HIGH (ref 74–109)
OSMOLALITY, CALCULATED: 281 mosm/kg (ref 270–290)
POTASSIUM: 3.8 mmol/L (ref 3.5–5.1)
PROTEIN TOTAL: 5.5 g/dL — ABNORMAL LOW (ref 6.4–8.9)
SODIUM: 140 mmol/L (ref 136–145)

## 2023-02-01 LAB — MAGNESIUM: MAGNESIUM: 1.4 mg/dL — ABNORMAL LOW (ref 1.9–2.7)

## 2023-02-03 ENCOUNTER — Other Ambulatory Visit (HOSPITAL_COMMUNITY): Payer: Self-pay | Admitting: EXTERNAL

## 2023-02-03 LAB — COMPREHENSIVE METABOLIC PANEL, NON-FASTING
ALBUMIN/GLOBULIN RATIO: 1.1 (ref 0.8–1.4)
ALBUMIN: 2.9 g/dL — ABNORMAL LOW (ref 3.5–5.7)
ALKALINE PHOSPHATASE: 159 U/L — ABNORMAL HIGH (ref 34–104)
ALT (SGPT): 21 U/L (ref 7–52)
ANION GAP: 5 mmol/L (ref 4–13)
AST (SGOT): 29 U/L (ref 13–39)
BILIRUBIN TOTAL: 0.4 mg/dL (ref 0.3–1.0)
BUN/CREA RATIO: 18 (ref 6–22)
BUN: 17 mg/dL (ref 7–25)
CALCIUM, CORRECTED: 11.2 mg/dL — ABNORMAL HIGH (ref 8.9–10.8)
CALCIUM: 10.3 mg/dL (ref 8.6–10.3)
CHLORIDE: 101 mmol/L (ref 98–107)
CO2 TOTAL: 34 mmol/L — ABNORMAL HIGH (ref 21–31)
CREATININE: 0.95 mg/dL (ref 0.60–1.30)
ESTIMATED GFR: 61 mL/min/{1.73_m2} (ref >59)
GLOBULIN: 2.6 (ref 2.0–3.5)
GLUCOSE: 101 mg/dL (ref 74–109)
OSMOLALITY, CALCULATED: 281 mosm/kg (ref 270–290)
POTASSIUM: 4.4 mmol/L (ref 3.5–5.1)
PROTEIN TOTAL: 5.5 g/dL — ABNORMAL LOW (ref 6.4–8.9)
SODIUM: 140 mmol/L (ref 136–145)

## 2023-02-03 LAB — CBC/DIFF - CLIENT CONSOLIDATED
BASOPHIL #: 0 10*3/uL (ref 0.00–0.10)
BASOPHIL %: 1 % (ref 0–1)
EOSINOPHIL #: 0.1 10*3/uL (ref 0.00–0.50)
EOSINOPHIL %: 3 % (ref 1–7)
HCT: 28.8 % — ABNORMAL LOW (ref 31.2–41.9)
HGB: 9.5 g/dL — ABNORMAL LOW (ref 10.9–14.3)
LYMPHOCYTE #: 1.7 10*3/uL (ref 1.00–3.00)
LYMPHOCYTE %: 54 % — ABNORMAL HIGH (ref 16–44)
MCH: 28.9 pg (ref 24.7–32.8)
MCHC: 33.2 g/dL (ref 32.3–35.6)
MCV: 87 fL (ref 75.5–95.3)
MONOCYTE #: 0.2 10*3/uL — ABNORMAL LOW (ref 0.30–1.00)
MONOCYTE %: 8 % (ref 5–13)
MPV: 9.8 fL (ref 7.9–10.8)
NEUTROPHIL #: 1.1 10*3/uL — ABNORMAL LOW (ref 1.85–7.80)
NEUTROPHIL %: 34 % — ABNORMAL LOW (ref 43–77)
PLATELETS: 103 10*3/uL — ABNORMAL LOW (ref 140–440)
RBC: 3.31 10*6/uL — ABNORMAL LOW (ref 3.63–4.92)
RDW: 18 % — ABNORMAL HIGH (ref 12.3–17.7)
WBC: 3.1 10*3/uL
WBC: 3.1 10*3/uL — ABNORMAL LOW (ref 3.8–11.8)

## 2023-02-03 LAB — CBC WITH DIFF
BASOPHIL #: 0 10*3/uL (ref 0.00–0.10)
BASOPHIL %: 1 % (ref 0–1)
EOSINOPHIL #: 0.1 10*3/uL (ref 0.00–0.50)
EOSINOPHIL %: 3 % (ref 1–7)
HCT: 28.8 % — ABNORMAL LOW (ref 31.2–41.9)
HGB: 9.5 g/dL — ABNORMAL LOW (ref 10.9–14.3)
LYMPHOCYTE #: 1.7 10*3/uL (ref 1.00–3.00)
LYMPHOCYTE %: 54 % — ABNORMAL HIGH (ref 16–44)
MCH: 28.9 pg (ref 24.7–32.8)
MCHC: 33.2 g/dL (ref 32.3–35.6)
MCV: 87 fL (ref 75.5–95.3)
MONOCYTE #: 0.2 10*3/uL — ABNORMAL LOW (ref 0.30–1.00)
MONOCYTE %: 8 % (ref 5–13)
MPV: 9.8 fL (ref 7.9–10.8)
NEUTROPHIL #: 1.1 10*3/uL — ABNORMAL LOW (ref 1.85–7.80)
NEUTROPHIL %: 34 % — ABNORMAL LOW (ref 43–77)
PLATELETS: 103 10*3/uL — ABNORMAL LOW (ref 140–440)
RBC: 3.31 10*6/uL — ABNORMAL LOW (ref 3.63–4.92)
RDW: 18 % — ABNORMAL HIGH (ref 12.3–17.7)
WBC: 3.1 10*3/uL — ABNORMAL LOW (ref 3.8–11.8)

## 2023-02-03 LAB — MAGNESIUM: MAGNESIUM: 1.3 mg/dL — ABNORMAL LOW (ref 1.9–2.7)

## 2023-02-03 NOTE — Telephone Encounter (Signed)
That's fine.  Would use shower chair

## 2023-02-03 NOTE — Telephone Encounter (Signed)
Judi Saa, LPN at [facility] and gave Deatra Robinson, NP's advisement.    Gina verbalized understanding.    Darius Bump, RN 02/03/2023 3:22 PM

## 2023-02-03 NOTE — Telephone Encounter (Signed)
Received call from [place] with [facility] in [place] wanting to know if TSLO can be removed so patient can take a shower. Current order states TSLO when Out of bed.    Advised I will send message to the provider and F/U when I receive a response.    Attending Neurosurgeon: Dr. Allena Katz    01/27/23 - L1 compression fracture     Future Appointments   Date Time Provider Department Center   03/13/2023  9:30 AM DX IMAGING ION 1ST FLOOR [facility] [place]   03/13/2023 10:00 AM Reino Kent, FNP [facility] [place]       Routing to Royetta Asal, PA-C for review and recommendations.     Darius Bump, RN 02/03/2023 1:17 PM

## 2023-02-04 ENCOUNTER — Encounter (INDEPENDENT_AMBULATORY_CARE_PROVIDER_SITE_OTHER): Payer: Self-pay | Admitting: HEMATOLOGY-ONCOLOGY

## 2023-02-05 ENCOUNTER — Encounter (INDEPENDENT_AMBULATORY_CARE_PROVIDER_SITE_OTHER): Payer: Self-pay | Admitting: HEMATOLOGY-ONCOLOGY

## 2023-02-05 NOTE — Nursing Note (Signed)
Called and spoke with patient's husband that reports patient was discharged to [facility] from [facility] on Friday November 15th.  He reports that patient was admitted at [facility] with COVID after a fall.  Advised to call once patient is discharged so we can reschedule appointment with Dr. [name] and for treatment.

## 2023-02-06 ENCOUNTER — Other Ambulatory Visit (HOSPITAL_COMMUNITY): Payer: Self-pay | Admitting: EXTERNAL

## 2023-02-06 LAB — COMPREHENSIVE METABOLIC PANEL, NON-FASTING
ALBUMIN/GLOBULIN RATIO: 1.1 (ref 0.8–1.4)
ALBUMIN: 3.1 g/dL — ABNORMAL LOW (ref 3.5–5.7)
ALKALINE PHOSPHATASE: 163 U/L — ABNORMAL HIGH (ref 34–104)
ALT (SGPT): 21 U/L (ref 7–52)
ANION GAP: 6 mmol/L (ref 4–13)
AST (SGOT): 26 U/L (ref 13–39)
BILIRUBIN TOTAL: 0.5 mg/dL (ref 0.3–1.0)
BUN/CREA RATIO: 17 (ref 6–22)
BUN: 16 mg/dL (ref 7–25)
CALCIUM, CORRECTED: 11.4 mg/dL — ABNORMAL HIGH (ref 8.9–10.8)
CALCIUM: 10.7 mg/dL — ABNORMAL HIGH (ref 8.6–10.3)
CHLORIDE: 102 mmol/L (ref 98–107)
CO2 TOTAL: 31 mmol/L (ref 21–31)
CREATININE: 0.94 mg/dL (ref 0.60–1.30)
ESTIMATED GFR: 61 mL/min/{1.73_m2} (ref >59)
GLOBULIN: 2.9 (ref 2.0–3.5)
GLUCOSE: 104 mg/dL (ref 74–109)
OSMOLALITY, CALCULATED: 279 mosm/kg (ref 270–290)
POTASSIUM: 4.2 mmol/L (ref 3.5–5.1)
PROTEIN TOTAL: 6 g/dL — ABNORMAL LOW (ref 6.4–8.9)
SODIUM: 139 mmol/L (ref 136–145)

## 2023-02-06 LAB — CBC/DIFF - CLIENT CONSOLIDATED
BASOPHIL #: 0 10*3/uL (ref 0.00–0.10)
BASOPHIL %: 1 % (ref 0–1)
EOSINOPHIL #: 0.1 10*3/uL (ref 0.00–0.50)
EOSINOPHIL %: 3 % (ref 1–7)
HCT: 30.4 % — ABNORMAL LOW (ref 31.2–41.9)
HGB: 10 g/dL — ABNORMAL LOW (ref 10.9–14.3)
LYMPHOCYTE #: 1.6 10*3/uL (ref 1.00–3.00)
LYMPHOCYTE %: 53 % — ABNORMAL HIGH (ref 16–44)
MCH: 28.5 pg (ref 24.7–32.8)
MCHC: 32.9 g/dL (ref 32.3–35.6)
MCV: 86.7 fL (ref 75.5–95.3)
MONOCYTE #: 0.3 10*3/uL (ref 0.30–1.00)
MONOCYTE %: 10 % (ref 5–13)
MPV: 9.6 fL (ref 7.9–10.8)
NEUTROPHIL #: 1 10*3/uL — ABNORMAL LOW (ref 1.85–7.80)
NEUTROPHIL %: 33 % — ABNORMAL LOW (ref 43–77)
PLATELET MORPHOLOGY COMMENT: ADEQUATE
PLATELETS: 120 10*3/uL — ABNORMAL LOW (ref 140–440)
RBC: 3.5 10*6/uL — ABNORMAL LOW (ref 3.63–4.92)
RDW: 17.6 % (ref 12.3–17.7)
WBC: 3 10*3/uL
WBC: 3 10*3/uL — ABNORMAL LOW (ref 3.8–11.8)

## 2023-02-06 LAB — SCAN DIFFERENTIAL: PLATELET MORPHOLOGY COMMENT: ADEQUATE

## 2023-02-06 LAB — CBC WITH DIFF
BASOPHIL #: 0 10*3/uL (ref 0.00–0.10)
BASOPHIL %: 1 % (ref 0–1)
EOSINOPHIL #: 0.1 10*3/uL (ref 0.00–0.50)
EOSINOPHIL %: 3 % (ref 1–7)
HCT: 30.4 % — ABNORMAL LOW (ref 31.2–41.9)
HGB: 10 g/dL — ABNORMAL LOW (ref 10.9–14.3)
LYMPHOCYTE #: 1.6 10*3/uL (ref 1.00–3.00)
LYMPHOCYTE %: 53 % — ABNORMAL HIGH (ref 16–44)
MCH: 28.5 pg (ref 24.7–32.8)
MCHC: 32.9 g/dL (ref 32.3–35.6)
MCV: 86.7 fL (ref 75.5–95.3)
MONOCYTE #: 0.3 10*3/uL (ref 0.30–1.00)
MONOCYTE %: 10 % (ref 5–13)
MPV: 9.6 fL (ref 7.9–10.8)
NEUTROPHIL #: 1 10*3/uL — ABNORMAL LOW (ref 1.85–7.80)
NEUTROPHIL %: 33 % — ABNORMAL LOW (ref 43–77)
PLATELETS: 120 10*3/uL — ABNORMAL LOW (ref 140–440)
RBC: 3.5 10*6/uL — ABNORMAL LOW (ref 3.63–4.92)
RDW: 17.6 % (ref 12.3–17.7)
WBC: 3 10*3/uL — ABNORMAL LOW (ref 3.8–11.8)

## 2023-02-06 LAB — MAGNESIUM: MAGNESIUM: 1.3 mg/dL — ABNORMAL LOW (ref 1.9–2.7)

## 2023-02-07 ENCOUNTER — Ambulatory Visit (HOSPITAL_COMMUNITY): Payer: Medicare Other

## 2023-02-10 ENCOUNTER — Other Ambulatory Visit (HOSPITAL_COMMUNITY): Payer: Self-pay | Admitting: EXTERNAL

## 2023-02-10 LAB — CBC WITH DIFF
BASOPHIL #: 0 10*3/uL (ref 0.00–0.10)
BASOPHIL %: 1 % (ref 0–1)
EOSINOPHIL #: 0.1 10*3/uL (ref 0.00–0.50)
EOSINOPHIL %: 3 % (ref 1–7)
HCT: 30.9 % — ABNORMAL LOW (ref 31.2–41.9)
HGB: 10.3 g/dL — ABNORMAL LOW (ref 10.9–14.3)
LYMPHOCYTE #: 1.7 10*3/uL (ref 1.00–3.00)
LYMPHOCYTE %: 56 % — ABNORMAL HIGH (ref 16–44)
MCH: 28.8 pg (ref 24.7–32.8)
MCHC: 33.3 g/dL (ref 32.3–35.6)
MCV: 86.7 fL (ref 75.5–95.3)
MONOCYTE #: 0.3 10*3/uL (ref 0.30–1.00)
MONOCYTE %: 10 % (ref 5–13)
MPV: 10.1 fL (ref 7.9–10.8)
NEUTROPHIL #: 1 10*3/uL — ABNORMAL LOW (ref 1.85–7.80)
NEUTROPHIL %: 31 % — ABNORMAL LOW (ref 43–77)
PLATELETS: 117 10*3/uL — ABNORMAL LOW (ref 140–440)
RBC: 3.56 10*6/uL — ABNORMAL LOW (ref 3.63–4.92)
RDW: 17.2 % (ref 12.3–17.7)
WBC: 3.1 10*3/uL — ABNORMAL LOW (ref 3.8–11.8)

## 2023-02-10 LAB — CBC/DIFF - CLIENT CONSOLIDATED
BASOPHIL #: 0 10*3/uL (ref 0.00–0.10)
BASOPHIL %: 1 % (ref 0–1)
EOSINOPHIL #: 0.1 10*3/uL (ref 0.00–0.50)
EOSINOPHIL %: 3 % (ref 1–7)
HCT: 30.9 % — ABNORMAL LOW (ref 31.2–41.9)
HGB: 10.3 g/dL — ABNORMAL LOW (ref 10.9–14.3)
LYMPHOCYTE #: 1.7 10*3/uL (ref 1.00–3.00)
LYMPHOCYTE %: 56 % — ABNORMAL HIGH (ref 16–44)
MCH: 28.8 pg (ref 24.7–32.8)
MCHC: 33.3 g/dL (ref 32.3–35.6)
MCV: 86.7 fL (ref 75.5–95.3)
MONOCYTE #: 0.3 10*3/uL (ref 0.30–1.00)
MONOCYTE %: 10 % (ref 5–13)
MPV: 10.1 fL (ref 7.9–10.8)
NEUTROPHIL #: 1 10*3/uL — ABNORMAL LOW (ref 1.85–7.80)
NEUTROPHIL %: 31 % — ABNORMAL LOW (ref 43–77)
PLATELET MORPHOLOGY COMMENT: ADEQUATE
PLATELETS: 117 10*3/uL — ABNORMAL LOW (ref 140–440)
RBC: 3.56 10*6/uL — ABNORMAL LOW (ref 3.63–4.92)
RDW: 17.2 % (ref 12.3–17.7)
WBC: 3.1 10*3/uL
WBC: 3.1 10*3/uL — ABNORMAL LOW (ref 3.8–11.8)

## 2023-02-10 LAB — MAGNESIUM: MAGNESIUM: 1.4 mg/dL — ABNORMAL LOW (ref 1.9–2.7)

## 2023-02-10 LAB — COMPREHENSIVE METABOLIC PANEL, NON-FASTING
ALBUMIN/GLOBULIN RATIO: 1.2 (ref 0.8–1.4)
ALBUMIN: 3.3 g/dL — ABNORMAL LOW (ref 3.5–5.7)
ALKALINE PHOSPHATASE: 142 U/L — ABNORMAL HIGH (ref 34–104)
ALT (SGPT): 22 U/L (ref 7–52)
ANION GAP: 4 mmol/L (ref 4–13)
AST (SGOT): 27 U/L (ref 13–39)
BILIRUBIN TOTAL: 0.4 mg/dL (ref 0.3–1.0)
BUN/CREA RATIO: 16 (ref 6–22)
BUN: 17 mg/dL (ref 7–25)
CALCIUM, CORRECTED: 11.3 mg/dL — ABNORMAL HIGH (ref 8.9–10.8)
CALCIUM: 10.7 mg/dL — ABNORMAL HIGH (ref 8.6–10.3)
CHLORIDE: 102 mmol/L (ref 98–107)
CO2 TOTAL: 32 mmol/L — ABNORMAL HIGH (ref 21–31)
CREATININE: 1.04 mg/dL (ref 0.60–1.30)
ESTIMATED GFR: 54 mL/min/{1.73_m2} — ABNORMAL LOW (ref >59)
GLOBULIN: 2.7 (ref 2.0–3.5)
GLUCOSE: 98 mg/dL (ref 74–109)
OSMOLALITY, CALCULATED: 277 mosm/kg (ref 270–290)
POTASSIUM: 4.3 mmol/L (ref 3.5–5.1)
PROTEIN TOTAL: 6 g/dL — ABNORMAL LOW (ref 6.4–8.9)
SODIUM: 138 mmol/L (ref 136–145)

## 2023-02-10 LAB — SCAN DIFFERENTIAL: PLATELET MORPHOLOGY COMMENT: ADEQUATE

## 2023-02-11 ENCOUNTER — Ambulatory Visit (INDEPENDENT_AMBULATORY_CARE_PROVIDER_SITE_OTHER)
Admission: RE | Admit: 2023-02-11 | Discharge: 2023-02-11 | Disposition: A | Discharge status: Home / Self Care | Payer: Medicare Other | Source: Ambulatory Visit | Attending: HEMATOLOGY-ONCOLOGY | Admitting: HEMATOLOGY-ONCOLOGY

## 2023-02-11 ENCOUNTER — Ambulatory Visit: Payer: Medicare Other | Attending: HEMATOLOGY-ONCOLOGY | Admitting: HEMATOLOGY-ONCOLOGY

## 2023-02-11 ENCOUNTER — Encounter (INDEPENDENT_AMBULATORY_CARE_PROVIDER_SITE_OTHER): Payer: Self-pay | Admitting: HEMATOLOGY-ONCOLOGY

## 2023-02-11 ENCOUNTER — Other Ambulatory Visit: Payer: Self-pay

## 2023-02-11 VITALS — BP 140/74 | HR 78 | Temp 97.4°F | Ht 64.0 in | Wt 193.3 lb

## 2023-02-11 DIAGNOSIS — M810 Age-related osteoporosis without current pathological fracture: Secondary | ICD-10-CM | POA: Insufficient documentation

## 2023-02-11 DIAGNOSIS — C50919 Malignant neoplasm of unspecified site of unspecified female breast: Secondary | ICD-10-CM

## 2023-02-11 DIAGNOSIS — Z8542 Personal history of malignant neoplasm of other parts of uterus: Secondary | ICD-10-CM | POA: Insufficient documentation

## 2023-02-11 DIAGNOSIS — S32000D Wedge compression fracture of unspecified lumbar vertebra, subsequent encounter for fracture with routine healing: Secondary | ICD-10-CM

## 2023-02-11 DIAGNOSIS — S32009D Unspecified fracture of unspecified lumbar vertebra, subsequent encounter for fracture with routine healing: Secondary | ICD-10-CM | POA: Insufficient documentation

## 2023-02-11 DIAGNOSIS — Z902 Acquired absence of lung [part of]: Secondary | ICD-10-CM | POA: Insufficient documentation

## 2023-02-11 DIAGNOSIS — Z85118 Personal history of other malignant neoplasm of bronchus and lung: Secondary | ICD-10-CM | POA: Insufficient documentation

## 2023-02-11 NOTE — Nursing Note (Signed)
Patient completed psychosocial distress screening using the Enhanced NCCN Distress Thermometer (DT) Tool and self-reported an overall score of 5.  Patient reports pain, fatigue, decreased physical ability, stress, and depression as concerns.  Patient recently had a fall with a compression fracture and was also diagnosed with COVID during that time.  [facility].  After discussion with Dr. [name], it was decided that patient would resume treatment.

## 2023-02-11 NOTE — Progress Notes (Unsigned)
Department of Hematology/Oncology  Progress Note   Name: Kelsey Preston  ZOX:[id number]  Date of Birth: 1940/04/09  Encounter Date: 02/11/2023    REFERRING PROVIDER:  Charm Rings, MD  [address]  [place],  [place] [address]    REASON FOR OFFICE VISIT:  Breast Cancer     HISTORY OF PRESENT ILLNESS:  Kelsey Preston is a 82 y.o. female who presents today for follow up of breast cancer     The patient has a history of non-small-cell lung cancer, status post lobectomy in 2014.      She also has a history of endometrial cancer, and is status post surgical resection of that cancer as well.    More recently, the patient was found to have a left axillary lesion measuring 2.3 cm on PET-CT scan.  For some reason, I can not find an actual size measurement on the pathology report.  Multiple imaging studies of the breast were performed, but no primary lesion was found.  The malignant lesion was definitively classified as a lymph node.    Receptor staining showed that it was ER negative and HER2 Neu positive.  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    09/10/2022: The patient is here for follow up of early stage breast cancer.  She is doing well and denies any new problems at this time.    09/27/2022: The patient is here for follow up breast cancer.  She is doing well at this time and has no new complaints.    10/18/2022: the patient is here for follow up of breast cancer. She has been on treatment with paclitaxel and trastuzumab. She has been hospitalized since the last visit due to neutropenia and hypotension. She has also had electrolyte issues, with her magnesium level being somewhat low. She is taking over-the-counter magnesium oxide.    11/08/2022: The patient is here for follow up of breast cancer.  She has been on treatment with weekly paclitaxel and trastuzumab.  She did end up in the hospital relatively recently with some low cell counts, but it appears to have resolved.    12/06/2022: The patient is here for follow  up of breast cancer.  She has been doing relatively well but has noticed some peripheral neuropathy.    02/11/2023: The patient is here for follow up of breast cancer.  She was on trastuzumab, and it was held due to a percentage decline in her ejection fraction.  She had a recent follow up echocardiogram which showed complete recovery.    ROS:   Pertinent review of systems as discussed in HPI    HISTORY:  Past Medical History:   Diagnosis Date    Diabetes mellitus, type 2 (CMS HCC)     Embolism (CMS HCC)     left leg    Endometrial cancer (CMS HCC)     HTN (hypertension)     Hx of breast cancer     Lung cancer (CMS HCC)     Macular degeneration (senile) of retina          Past Surgical History:   Procedure Laterality Date    HIP SURGERY Left     HX CHOLECYSTECTOMY      HX COLONOSCOPY      HX HYSTERECTOMY      HX LOBECTOMY Left     LUNG CANCER SURGERY      PORTACATH PLACEMENT           Social History  Socioeconomic History    Marital status: Married     Spouse name: Not on file    Number of children: Not on file    Years of education: Not on file    Highest education level: Not on file   Occupational History    Not on file   Tobacco Use    Smoking status: Never    Smokeless tobacco: Never   Vaping Use    Vaping status: Never Used   Substance and Sexual Activity    Alcohol use: Never    Drug use: Never    Sexual activity: Not Currently   Other Topics Concern    Not on file   Social History Narrative    Not on file     Social Determinants of Health     Financial Resource Strain: Medium Risk (01/29/2023)    Received from [facility]    Overall Financial Resource Strain (CARDIA)     Difficulty of Paying Living Expenses: Somewhat hard   Transportation Needs: No Transportation Needs (01/29/2023)    Received from [facility] - Transportation     Lack of Transportation (Medical): No     Lack of Transportation (Non-Medical): No   Social Connections: Moderately Integrated (01/29/2023)    Received from  [facility]    Social Connection and Isolation Panel [NHANES]     Frequency of Communication with Friends and Family: More than three times a week     Frequency of Social Gatherings with Friends and Family: More than three times a week     Attends Religious Services: More than 4 times per year     Active Member of [employer] or Organizations: No     Attends Banker Meetings: Never     Marital Status: Married   Catering manager Violence: Not At Risk (01/29/2023)    Received from [employer], Afraid, Rape, and Kick questionnaire     Fear of Current or Ex-Partner: No     Emotionally Abused: No     Physically Abused: No     Sexually Abused: No   Housing Stability: Low Risk  (01/29/2023)    Received from [facility] Stability Vital Sign     Unable to Pay for Housing in the Last Year: No     Number of Times Moved in the Last Year: 1     Homeless in the Last Year: No     Family Medical History:       Problem Relation (Age of Onset)    Cancer Brother            Current Outpatient Medications   Medication Sig    atorvastatin (LIPITOR) 40 mg Oral Tablet Take 1 Tablet (40 mg total) by mouth Every evening for 30 days    carvediloL (COREG) 6.25 mg Oral Tablet Take 1 Tablet (6.25 mg total) by mouth Twice daily with food for 30 days    docusate sodium (COLACE) 100 mg Oral Capsule Take 1 Capsule (100 mg total) by mouth Twice daily    esomeprazole magnesium (NEXIUM) 40 mg Oral Capsule, Delayed Release(E.C.) Take 1 Capsule (40 mg total) by mouth Every morning before breakfast    ezetimibe (ZETIA) 10 mg Oral Tablet Take 1 Tablet (10 mg total) by mouth Every evening    famotidine (PEPCID) 40 mg Oral Tablet Take 1 Tablet (40 mg total) by mouth Once a day    magnesium  Chloride (SLOW-MAG) 71.5 mg Oral Tablet, Delayed Release (E.C.) Take 1 Tablet (71.5 mg total) by mouth Twice daily    magnesium oxide (MAG-OX) 400 mg Oral Tablet Take 1 Tablet (400 mg total) by mouth Twice daily    metFORMIN  (GLUCOPHAGE XR) 500 mg Oral Tablet Sustained Release 24 hr Take 1 Tablet (500 mg total) by mouth Twice daily    multivitamin with iron Oral Tablet Take 1 Tablet by mouth Once a day    ondansetron (ZOFRAN ODT) 4 mg Oral Tablet, Rapid Dissolve Take 1 Tablet (4 mg total) by mouth Every 8 hours as needed for Nausea/Vomiting Indications: prevent nausea and vomiting from cancer chemotherapy    sertraline (ZOLOFT) 50 mg Oral Tablet Take 1 Tablet (50 mg total) by mouth Once a day    telmisartan (MICARDIS) 80 mg Oral Tablet Take 0.5 Tablets (40 mg total) by mouth Once a day    traMADoL (ULTRAM) 50 mg Oral Tablet Take 1 Tablet (50 mg total) by mouth Every 4 hours as needed for Pain    vit C/E/Zn/coppr/lutein/zeaxan (PRESERVISION AREDS-2 ORAL) Take 1 Tablet by mouth Twice daily    XARELTO 20 mg Oral Tablet TAKE 1 TABLET BY MOUTH EVERY EVENING WITH DINNER     Allergies   Allergen Reactions    Adhesive Rash    Ceclor [Cefaclor]  Other Adverse Reaction (Add comment)     Pt states a Ceclor pill gave her blisters in her mouth.       PHYSICAL EXAM:  Most Recent Vitals    Flowsheet Row Telemedicine from 09/02/2022 in Hematology/Oncology, [facility]   Temperature 36.3 C (97.3 F) filed at... 09/02/2022 0911   Heart Rate 77 filed at... 09/02/2022 0911   Respiratory Rate --   BP (Non-Invasive) 174/91 filed at... 09/02/2022 0911   SpO2 95 % filed at... 09/02/2022 0911   Height 1.626 m (5\' 4" ) filed at... 09/02/2022 0911   Weight 98.2 kg (216 lb 6.4 oz) filed at... 09/02/2022 0911   BMI (Calculated) 37.22 filed at... 09/02/2022 0911   BSA (Calculated) 2.11 filed at... 09/02/2022 0911      ECOG Status: (1) Restricted in physically strenuous activity, ambulatory and able to do work of light nature   Physical Exam    DIAGNOSTIC DATA:  No results found for this or any previous visit (from the past 29528 hour(s)).    LABS:   CBC  Diff   Lab Results   Component Value Date/Time    WBC 3.1 (L) 02/10/2023 04:43 AM    WBC 3.1 02/10/2023  04:43 AM    WBC 3.1 (L) 02/10/2023 04:43 AM    HGB 10.3 (L) 02/10/2023 04:43 AM    HGB 10.3 (L) 02/10/2023 04:43 AM    HCT 30.9 (L) 02/10/2023 04:43 AM    HCT 30.9 (L) 02/10/2023 04:43 AM    PLTCNT 117 (L) 02/10/2023 04:43 AM    PLTCNT 117 (L) 02/10/2023 04:43 AM    RBC 3.56 (L) 02/10/2023 04:43 AM    RBC 3.56 (L) 02/10/2023 04:43 AM    MCV 86.7 02/10/2023 04:43 AM    MCV 86.7 02/10/2023 04:43 AM    MCHC 33.3 02/10/2023 04:43 AM    MCHC 33.3 02/10/2023 04:43 AM    MCH 28.8 02/10/2023 04:43 AM    MCH 28.8 02/10/2023 04:43 AM    RDW 17.2 02/10/2023 04:43 AM    RDW 17.2 02/10/2023 04:43 AM    MPV 10.1 02/10/2023 04:43 AM  MPV 10.1 02/10/2023 04:43 AM    Lab Results   Component Value Date/Time    PMNS 31 (L) 02/10/2023 04:43 AM    PMNS 31 (L) 02/10/2023 04:43 AM    LYMPHOCYTES 56 (H) 02/10/2023 04:43 AM    LYMPHOCYTES 56 (H) 02/10/2023 04:43 AM    EOSINOPHIL 3 02/10/2023 04:43 AM    EOSINOPHIL 3 02/10/2023 04:43 AM    MONOCYTES 10 02/10/2023 04:43 AM    MONOCYTES 10 02/10/2023 04:43 AM    BASOPHILS 1 02/10/2023 04:43 AM    BASOPHILS 0.00 02/10/2023 04:43 AM    BASOPHILS 1 02/10/2023 04:43 AM    BASOPHILS 0.00 02/10/2023 04:43 AM    PMNABS 1.00 (L) 02/10/2023 04:43 AM    PMNABS 1.00 (L) 02/10/2023 04:43 AM    LYMPHSABS 1.70 02/10/2023 04:43 AM    LYMPHSABS 1.70 02/10/2023 04:43 AM    EOSABS 0.10 02/10/2023 04:43 AM    EOSABS 0.10 02/10/2023 04:43 AM    MONOSABS 0.30 02/10/2023 04:43 AM    MONOSABS 0.30 02/10/2023 04:43 AM    BASABS 0.04 05/30/2022 05:40 AM            Comprehensive Metabolic Profile    Lab Results   Component Value Date    SODIUM 138 02/10/2023    POTASSIUM 4.3 02/10/2023    CHLORIDE 102 02/10/2023    CO2 32 (H) 02/10/2023    ANIONGAP 4 02/10/2023    BUN 17 02/10/2023    CREATININE 1.04 02/10/2023    ALBUMIN 3.3 (L) 02/10/2023    CALCIUM 10.7 (H) 02/10/2023    GLUCOSENF 98 02/10/2023    ALKPHOS 142 (H) 02/10/2023    ALT 22 02/10/2023    AST 27 02/10/2023    TOTBILIRUBIN 0.4 02/10/2023    TOTALPROTEIN  6.0 (L) 02/10/2023          BASIC METABOLIC PANEL  Lab Results   Component Value Date    SODIUM 138 02/10/2023    POTASSIUM 4.3 02/10/2023    CHLORIDE 102 02/10/2023    CO2 32 (H) 02/10/2023    ANIONGAP 4 02/10/2023    BUN 17 02/10/2023    CREATININE 1.04 02/10/2023    BUNCRRATIO 16 02/10/2023    GFR 54 (L) 02/10/2023    CALCIUM 10.7 (H) 02/10/2023    GLUCOSE Negative 01/05/2023    GLUCOSENF 98 02/10/2023           ASSESSMENT:  Problem List Items Addressed This Visit          Oncology    History of lung cancer    Breast cancer (CMS HCC) - Primary       Musculoskeletal    Traumatic compression fracture of lumbar vertebra with routine healing    Relevant Orders    Referral to ONCOLOGY INFUSION - [place] - (Chemo, Hydration, Injection, Infusion, Pheresis, Photopheresis))    Osteoporosis    Relevant Orders    Referral to ONCOLOGY INFUSION - [place] - (Chemo, Hydration, Injection, Infusion, Pheresis, Photopheresis))        ICD-10-CM    1. Breast cancer (CMS HCC)  C50.919       2. History of lung cancer  Z85.118       3. Traumatic compression fracture of lumbar vertebra with routine healing  S32.000D Referral to ONCOLOGY INFUSION - [place] - (Chemo, Hydration, Injection, Infusion, Pheresis, Photopheresis))      4. Osteoporosis, unspecified osteoporosis type, unspecified pathological fracture presence  M81.0 Referral to ONCOLOGY INFUSION - [place] - (Chemo, Hydration,  Injection, Infusion, Pheresis, Photopheresis))           PLAN:   1. All relevant medical records were reviewed including available pertinent provider notes, procedure notes, imaging, laboratory, and pathology.   2. All pertinent labs and/or imaging were reviewed with the patient.   3. Breast cancer:  Status post resection of an involved lymph node that was 2.3 cm on PET/CT.  No primary lesion was seen on imaging studies.  T0 N1 M0, stage IIA.  ER negative, HER2 Neu positive.  After resection of the node, she was rendered cancer-free radiographically.   She is on treatment with adjuvant paclitaxel and trastuzumab.  We had previously discussed the options of either no additional surgery and taking our chances versus radiation of the breast versus mastectomy.  Radiation to the axillary area will likely be required due to the lymph node that was known to be involved.  Her cardiac function is acceptable, and we will make arrangements to resume trastuzumab adjuvant therapy.  Oncology for additional discussion.  4. History of lung cancer:  Continue surveillance.    5. History of gynecologic cancer: Continue surveillance as well.  6. Compression fracture: The patient had a recent compression fracture after suffering a fall.  I do not have any reason to suspect that it was pathologic in nature.  We discussed use of Prolia in order to prevent future skeletal related events.  I have placed orders in the system and we will try to move forward with that at the time of her next trastuzumab infusion.      Kelsey Preston was given the chance to ask questions, and these were answered to their satisfaction. The patient is welcome to call with any questions or concerns in the meantime.     On the day of the encounter, a total of 45 minutes was spent on this patient encounter including review of historical information, examination, documentation and post-visit activities.   Return in about 1 month (around 03/13/2023).     Kelsey Dawn, MD  02/11/2023 , 17:33  The patient's insurance company bears full legal and financial responsibility resulting from any deviations that they cause to my recommended treatment plan.   CC:  Charm Rings, MD  [address]  [place] [address]    Charm Rings, MD  [address]  [place],  [place] [address]    This note was partially generated using MModal Fluency Direct system, and there may be some incorrect words, spellings, and punctuation that were not noted in checking the note before saving.

## 2023-02-11 NOTE — Nursing Note (Signed)
Triaged patient and placed her in room # 2. Her vitals were WNL. She had a fall several weeks ago and was transferred to [facility], and then from there to [facility]. She has a T-1 compression fracture, she is wearing a back brace just to help with the comfort level. She needs to go over PET scan and also find out if she is ready to restart her treatment. She has an appt. With radiation next week.

## 2023-02-12 ENCOUNTER — Ambulatory Visit (INDEPENDENT_AMBULATORY_CARE_PROVIDER_SITE_OTHER): Payer: Self-pay | Admitting: HEMATOLOGY-ONCOLOGY

## 2023-02-12 ENCOUNTER — Encounter (INDEPENDENT_AMBULATORY_CARE_PROVIDER_SITE_OTHER): Payer: Self-pay | Admitting: HEMATOLOGY-ONCOLOGY

## 2023-02-12 NOTE — Telephone Encounter (Signed)
Patient and family in the room. Patient has had several falls and covid. Went over echo and PET scan. Wanting to restart treatment. Has an appointment with Dr. Joslyn Devon already. Asked if neuropathy will get better. Went over labs. Talked about Prolia every 6 months. Scheduled for chemo to restart on 12-4.

## 2023-02-14 ENCOUNTER — Encounter (INDEPENDENT_AMBULATORY_CARE_PROVIDER_SITE_OTHER): Payer: Self-pay | Admitting: HEMATOLOGY-ONCOLOGY

## 2023-02-18 ENCOUNTER — Ambulatory Visit (HOSPITAL_BASED_OUTPATIENT_CLINIC_OR_DEPARTMENT_OTHER): Payer: Medicare Other

## 2023-02-18 ENCOUNTER — Telehealth (INDEPENDENT_AMBULATORY_CARE_PROVIDER_SITE_OTHER): Payer: Self-pay | Admitting: HEMATOLOGY-ONCOLOGY

## 2023-02-18 ENCOUNTER — Other Ambulatory Visit: Payer: Self-pay

## 2023-02-18 ENCOUNTER — Inpatient Hospital Stay
Admission: RE | Admit: 2023-02-18 | Discharge: 2023-02-18 | Disposition: A | Discharge status: Still In Hospital | Payer: Medicare Other | Source: Ambulatory Visit | Attending: RADIATION ONCOLOGY | Admitting: RADIATION ONCOLOGY

## 2023-02-18 DIAGNOSIS — Z171 Estrogen receptor negative status [ER-]: Secondary | ICD-10-CM | POA: Insufficient documentation

## 2023-02-18 DIAGNOSIS — C773 Secondary and unspecified malignant neoplasm of axilla and upper limb lymph nodes: Secondary | ICD-10-CM | POA: Insufficient documentation

## 2023-02-18 DIAGNOSIS — Z79620 Long term (current) use of immunosuppressive biologic: Secondary | ICD-10-CM | POA: Insufficient documentation

## 2023-02-18 DIAGNOSIS — C50912 Malignant neoplasm of unspecified site of left female breast: Secondary | ICD-10-CM | POA: Insufficient documentation

## 2023-02-18 DIAGNOSIS — Z1732 Human epidermal growth factor receptor 2 negative status: Secondary | ICD-10-CM | POA: Insufficient documentation

## 2023-02-18 DIAGNOSIS — C50919 Malignant neoplasm of unspecified site of unspecified female breast: Secondary | ICD-10-CM

## 2023-02-18 NOTE — Telephone Encounter (Signed)
Called pt to see if she planned to keep her appt in [facility] on 12/26 to see if we need to r/s her clinic and infusion appts for that day but pt states she plans to cancel [facility] appt that day and keep visits here the same/Kelsey Preston.

## 2023-02-18 NOTE — Progress Notes (Signed)
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  She has completed adjuvant paclitaxel times 12 weeks in his now continuing trastuzumab.    Assessment:  82 year old with left breast cancer no primary tumor post resection of axillary node.    Recommendations:  I have recommended signatera testing.  I would consider adjuvant radiotherapy if her Signatera testing is positive.  I would likely monitor with serial signatera testing if it is negative.  She will continue receiving trastuzumab and return to see [place] in 3 months.  I will call her when her testing results are complete.    The indications, time course, benefits, risks and side effects of radiation treatment were explained to the patient, and her questions were answered to her apparent satisfaction. I encouraged her to contact us at any time should she have any further questions or concerns. I personally saw and examined the patient, and reviewed all prior imaging and pathologic findings with her. I spent greater than 50% of a 45 minute visit in discussion of the patient's diagnosis and management.    FULL NOTE   Chief Complaint:    History of Present Illness :   Kelsey Preston is a 82 y.o. female with a history of 2 prior cancers in the distant past.  She had prior resection for a endometrial cancer as well as a primary lung cancer.  She has done well without evidence of recurrence of either of these cancers.  She was noted to have an enlarging axillary lymph node.  Testing including PET-CT showed uptake in the lymph node.  Biopsy was performed April of the 2nd that showed cancer.  Lymph node was resected May 21st.  Pathology showed 2.6 cm lymph node that was essentially entirely replaced by tumor.  Caris testing showed this to be ER negative PR  negative HER2 Neu positive.  She was seen by Dr. [name] in his completed course of paclitaxel with trastuzumab.  She will now continue trismus pertuzumab for entire year.  She has had some difficulty with neuropathy.  She has had 2 falls resulting in hip fracture and compression fracture of her spine.    Prior Radiation:  None     Prior Chemotherapy:  With recent breast cancer diagnosis.    Pain Assessment:  Moderate intermittent pain.    Past Medical/Surgical History:  Past Medical History:   Diagnosis Date    Diabetes mellitus, type 2 (CMS HCC)     Embolism (CMS HCC)     left leg    Endometrial cancer (CMS HCC)     HTN (hypertension)     Hx of breast cancer     Lung cancer (CMS HCC)     Macular degeneration (senile) of retina          Past Surgical History:   Procedure Laterality Date    HIP SURGERY Left     HX CHOLECYSTECTOMY      HX COLONOSCOPY      HX HYSTERECTOMY      HX LOBECTOMY Left     LUNG CANCER SURGERY      PORTACATH PLACEMENT             Family History:   Family Medical History:  Problem Relation (Age of Onset)    Cancer Brother              Social History:   Social History     Socioeconomic History    Marital status: Married     Spouse name: Not on file    Number of children: Not on file    Years of education: Not on file    Highest education level: Not on file   Occupational History    Not on file   Tobacco Use    Smoking status: Never    Smokeless tobacco: Never   Vaping Use    Vaping status: Never Used   Substance and Sexual Activity    Alcohol use: Never    Drug use: Never    Sexual activity: Not Currently   Other Topics Concern    Not on file   Social History Narrative    Not on file     Social Determinants of Health     Financial Resource Strain: Medium Risk (01/29/2023)    Received from [facility]    Overall Financial Resource Strain (CARDIA)     Difficulty of Paying Living Expenses: Somewhat hard   Transportation Needs: No Transportation Needs (01/29/2023)    Received from [facility] - Transportation     Lack of Transportation (Medical): No     Lack of Transportation (Non-Medical): No   Social Connections: Moderately Integrated (01/29/2023)    Received from [facility]    Social Connection and Isolation Panel [NHANES]     Frequency of Communication with Friends and Family: More than three times a week     Frequency of Social Gatherings with Friends and Family: More than three times a week     Attends Religious Services: More than 4 times per year     Active Member of [employer] or Organizations: No     Attends Banker Meetings: Never     Marital Status: Married   Catering manager Violence: Not At Risk (01/29/2023)    Received from [employer], Afraid, Rape, and Kick questionnaire     Fear of Current or Ex-Partner: No     Emotionally Abused: No     Physically Abused: No     Sexually Abused: No   Housing Stability: Low Risk  (01/29/2023)    Received from [facility] Stability Vital Sign     Unable to Pay for Housing in the Last Year: No     Number of Times Moved in the Last Year: 1     Homeless in the Last Year: No       ALLERGIES:   Allergies   Allergen Reactions    Adhesive Rash    Ceclor [Cefaclor]  Other Adverse Reaction (Add comment)     Pt states a Ceclor pill gave her blisters in her mouth.        MEDICATIONS:   Current Outpatient Medications   Medication Instructions    atorvastatin (LIPITOR) 40 mg, Oral, EVERY EVENING    carvediloL (COREG) 6.25 mg, Oral, 2 TIMES DAILY WITH FOOD    docusate sodium (COLACE) 100 mg, Oral, 2 TIMES DAILY    esomeprazole magnesium (NEXIUM) 40 mg, Oral, EVERY MORNING BEFORE BREAKFAST    ezetimibe (ZETIA) 10 mg, Oral, EVERY EVENING    famotidine (PEPCID) 40 mg, Oral, DAILY    magnesium Chloride (SLOW-MAG) 71.5 mg, Oral, 2 TIMES DAILY  magnesium oxide (MAG-OX) 400 mg, Oral, 2 TIMES DAILY    metFORMIN (GLUCOPHAGE XR) 500 mg, Oral, 2 TIMES DAILY    multivitamin with iron Oral Tablet 1 Tablet, Oral,  DAILY    ondansetron (ZOFRAN ODT) 4 mg, Oral, EVERY 8 HOURS PRN    sertraline (ZOLOFT) 50 mg, Oral, DAILY    telmisartan (MICARDIS) 40 mg, Oral, DAILY    traMADoL (ULTRAM) 50 mg, Oral, EVERY 4 HOURS PRN    vit C/E/Zn/coppr/lutein/zeaxan (PRESERVISION AREDS-2 ORAL) 1 Tablet, Oral, 2 TIMES DAILY    Xarelto 20 mg, Oral, EVERY EVENING AFTER DINNER        INTERVAL SURGERIES:   Past Surgical History:   Procedure Laterality Date    HIP SURGERY Left     HX CHOLECYSTECTOMY      HX COLONOSCOPY      HX HYSTERECTOMY      HX LOBECTOMY Left     LUNG CANCER SURGERY      PORTACATH PLACEMENT          REVIEW OF SYSTEMS  Pertinent review of systems as discussed in HPI.      Objective:     Vitals:    02/18/23 0832   BP: (!) 157/94   Pulse: 90   Temp: 36 C (96.8 F)   Weight: 88.5 kg (195 lb)             PHYSICAL EXAMINATION  Physical Exam  Constitutional:       Appearance: Normal appearance.   Eyes:      Extraocular Movements: Extraocular movements intact.      Pupils: Pupils are equal, round, and reactive to light.   Pulmonary:      Effort: Pulmonary effort is normal.   Musculoskeletal:      Cervical back: Normal range of motion.   Skin:     General: Skin is warm.   Neurological:      General: No focal deficit present.      Mental Status: She is alert and oriented to person, place, and time.   Psychiatric:         Mood and Affect: Mood normal.         Behavior: Behavior normal.          LABS/IMAGING: All relevant labs and imaging were reviewed as per HPI.      Molli Barrows, MD 02/18/2023, 09:08    VW:[id number]@

## 2023-02-19 ENCOUNTER — Encounter (HOSPITAL_COMMUNITY): Payer: Self-pay

## 2023-02-19 ENCOUNTER — Encounter (INDEPENDENT_AMBULATORY_CARE_PROVIDER_SITE_OTHER): Payer: Self-pay | Admitting: HEMATOLOGY-ONCOLOGY

## 2023-02-19 ENCOUNTER — Other Ambulatory Visit: Payer: Self-pay

## 2023-02-19 ENCOUNTER — Ambulatory Visit
Admission: RE | Admit: 2023-02-19 | Discharge: 2023-02-19 | Disposition: A | Discharge status: Home / Self Care | Payer: Medicare Other | Source: Ambulatory Visit | Attending: HEMATOLOGY-ONCOLOGY | Admitting: HEMATOLOGY-ONCOLOGY

## 2023-02-19 VITALS — BP 156/92 | HR 71 | Temp 97.0°F | Resp 18 | Ht 64.0 in | Wt 195.0 lb

## 2023-02-19 DIAGNOSIS — S32000D Wedge compression fracture of unspecified lumbar vertebra, subsequent encounter for fracture with routine healing: Secondary | ICD-10-CM

## 2023-02-19 DIAGNOSIS — I82412 Acute embolism and thrombosis of left femoral vein: Secondary | ICD-10-CM | POA: Insufficient documentation

## 2023-02-19 DIAGNOSIS — M810 Age-related osteoporosis without current pathological fracture: Secondary | ICD-10-CM | POA: Insufficient documentation

## 2023-02-19 DIAGNOSIS — C50919 Malignant neoplasm of unspecified site of unspecified female breast: Secondary | ICD-10-CM | POA: Insufficient documentation

## 2023-02-19 DIAGNOSIS — S32009D Unspecified fracture of unspecified lumbar vertebra, subsequent encounter for fracture with routine healing: Secondary | ICD-10-CM | POA: Insufficient documentation

## 2023-02-19 DIAGNOSIS — Z5112 Encounter for antineoplastic immunotherapy: Secondary | ICD-10-CM | POA: Insufficient documentation

## 2023-02-19 LAB — COMPREHENSIVE METABOLIC PANEL, NON-FASTING
ALBUMIN/GLOBULIN RATIO: 1.2 (ref 0.8–1.4)
ALBUMIN: 3.6 g/dL (ref 3.5–5.7)
ALKALINE PHOSPHATASE: 152 U/L — ABNORMAL HIGH (ref 34–104)
ALT (SGPT): 22 U/L (ref 7–52)
ANION GAP: 7 mmol/L (ref 4–13)
AST (SGOT): 24 U/L (ref 13–39)
BILIRUBIN TOTAL: 0.5 mg/dL (ref 0.3–1.0)
BUN/CREA RATIO: 17 (ref 6–22)
BUN: 16 mg/dL (ref 7–25)
CALCIUM, CORRECTED: 10.4 mg/dL (ref 8.9–10.8)
CALCIUM: 10.1 mg/dL (ref 8.6–10.3)
CHLORIDE: 105 mmol/L (ref 98–107)
CO2 TOTAL: 27 mmol/L (ref 21–31)
CREATININE: 0.96 mg/dL (ref 0.60–1.30)
ESTIMATED GFR: 60 mL/min/{1.73_m2} (ref >59)
GLOBULIN: 2.9 (ref 2.0–3.5)
GLUCOSE: 176 mg/dL — ABNORMAL HIGH (ref 74–109)
OSMOLALITY, CALCULATED: 283 mosm/kg (ref 270–290)
POTASSIUM: 4 mmol/L (ref 3.5–5.1)
PROTEIN TOTAL: 6.5 g/dL (ref 6.4–8.9)
SODIUM: 139 mmol/L (ref 136–145)

## 2023-02-19 LAB — CBC WITH DIFF
BASOPHIL #: 0.1 10*3/uL (ref 0.00–0.10)
BASOPHIL %: 1 % (ref 0–1)
EOSINOPHIL #: 0.1 10*3/uL (ref 0.00–0.50)
EOSINOPHIL %: 2 % (ref 1–7)
HCT: 33.9 % (ref 31.2–41.9)
HGB: 11.1 g/dL (ref 10.9–14.3)
LYMPHOCYTE #: 1.6 10*3/uL (ref 1.00–3.00)
LYMPHOCYTE %: 38 % (ref 16–44)
MCH: 28.3 pg (ref 24.7–32.8)
MCHC: 32.7 g/dL (ref 32.3–35.6)
MCV: 86.6 fL (ref 75.5–95.3)
MONOCYTE #: 0.4 10*3/uL (ref 0.30–1.00)
MONOCYTE %: 8 % (ref 5–13)
MPV: 9.7 fL (ref 7.9–10.8)
NEUTROPHIL #: 2.2 10*3/uL (ref 1.85–7.80)
NEUTROPHIL %: 51 % (ref 43–77)
PLATELETS: 151 10*3/uL (ref 140–440)
RBC: 3.92 10*6/uL (ref 3.63–4.92)
RDW: 16.3 % (ref 12.3–17.7)
WBC: 4.3 10*3/uL (ref 3.8–11.8)

## 2023-02-19 LAB — MAGNESIUM: MAGNESIUM: 1.3 mg/dL — ABNORMAL LOW (ref 1.9–2.7)

## 2023-02-19 MED ORDER — MAGNESIUM SULFATE 1 GRAM/100 ML IN DEXTROSE 5 % INTRAVENOUS PIGGYBACK
1.0000 g | INJECTION | INTRAVENOUS | Status: AC
Start: 2023-02-19 — End: 2023-02-19
  Administered 2023-02-19 (×3): 1 g via INTRAVENOUS
  Administered 2023-02-19 (×2): 0 g via INTRAVENOUS
  Administered 2023-02-19: 1 g via INTRAVENOUS
  Administered 2023-02-19 (×2): 0 g via INTRAVENOUS

## 2023-02-19 MED ORDER — ALBUTEROL SULFATE 2.5 MG/3 ML (0.083 %) SOLUTION FOR NEBULIZATION
2.5000 mg | INHALATION_SOLUTION | Freq: Once | RESPIRATORY_TRACT | Status: DC | PRN
Start: 2023-02-19 — End: 2023-02-20

## 2023-02-19 MED ORDER — FAMOTIDINE (PF) 20 MG/2 ML INTRAVENOUS SOLUTION
20.0000 mg | Freq: Once | INTRAVENOUS | Status: DC | PRN
Start: 2023-02-19 — End: 2023-02-20

## 2023-02-19 MED ORDER — TRASTUZUMAB-ANNS 420 MG INTRAVENOUS SOLUTION
525.0000 mg | Freq: Once | INTRAVENOUS | Status: AC
Start: 2023-02-19 — End: 2023-02-19
  Administered 2023-02-19: 0 mg via INTRAVENOUS
  Administered 2023-02-19: 525 mg via INTRAVENOUS
  Filled 2023-02-19: qty 25

## 2023-02-19 MED ORDER — PROCHLORPERAZINE MALEATE 10 MG TABLET
ORAL_TABLET | ORAL | Status: AC
Start: 2023-02-19 — End: 2023-02-19
  Filled 2023-02-19: qty 1

## 2023-02-19 MED ORDER — DIPHENHYDRAMINE 50 MG/ML INJECTION SOLUTION
25.0000 mg | Freq: Once | INTRAMUSCULAR | Status: DC | PRN
Start: 2023-02-19 — End: 2023-02-20

## 2023-02-19 MED ORDER — EPINEPHRINE 1 MG/ML (1 ML) INJECTION SOLUTION
0.3000 mg | Freq: Once | INTRAMUSCULAR | Status: DC | PRN
Start: 2023-02-19 — End: 2023-02-20

## 2023-02-19 MED ORDER — DENOSUMAB 60 MG/ML SUBCUTANEOUS SYRINGE
60.0000 mg | INJECTION | Freq: Once | SUBCUTANEOUS | Status: AC
Start: 2023-02-19 — End: 2023-02-19
  Administered 2023-02-19: 60 mg via SUBCUTANEOUS

## 2023-02-19 MED ORDER — DEXTROSE 5% IN WATER (D5W) FLUSH BAG - 250 ML
INTRAVENOUS | Status: DC | PRN
Start: 2023-02-19 — End: 2023-02-20

## 2023-02-19 MED ORDER — HYDROCORTISONE SOD SUCCINATE 100 MG/2 ML VIAL WRAPPER
100.0000 mg | Freq: Once | INTRAMUSCULAR | Status: DC | PRN
Start: 2023-02-19 — End: 2023-02-20

## 2023-02-19 MED ORDER — ALBUTEROL SULFATE HFA 90 MCG/ACTUATION AEROSOL INHALER - RN
2.0000 | Freq: Once | RESPIRATORY_TRACT | Status: DC | PRN
Start: 2023-02-19 — End: 2023-02-20

## 2023-02-19 MED ORDER — MAGNESIUM SULFATE 1 GRAM/100 ML IN DEXTROSE 5 % INTRAVENOUS PIGGYBACK
INJECTION | INTRAVENOUS | Status: AC
Start: 2023-02-19 — End: 2023-02-19
  Filled 2023-02-19: qty 400

## 2023-02-19 MED ORDER — PROCHLORPERAZINE MALEATE 10 MG TABLET
10.0000 mg | ORAL_TABLET | Freq: Four times a day (QID) | ORAL | Status: DC | PRN
Start: 2023-02-19 — End: 2023-02-20
  Administered 2023-02-19: 10 mg via ORAL

## 2023-02-19 MED ORDER — SODIUM CHLORIDE 0.9% FLUSH BAG - 250 ML
INTRAVENOUS | Status: DC | PRN
Start: 2023-02-19 — End: 2023-02-20

## 2023-02-19 MED ORDER — DENOSUMAB 60 MG/ML SUBCUTANEOUS SYRINGE
INJECTION | SUBCUTANEOUS | Status: AC
Start: 2023-02-19 — End: 2023-02-19
  Filled 2023-02-19: qty 1

## 2023-02-19 MED ORDER — DIPHENHYDRAMINE 50 MG/ML INJECTION SOLUTION
50.0000 mg | Freq: Once | INTRAMUSCULAR | Status: DC | PRN
Start: 2023-02-19 — End: 2023-02-20

## 2023-02-19 MED ORDER — MEPERIDINE (PF) 25 MG/ML INJECTION SOLUTION
12.5000 mg | Freq: Once | INTRAMUSCULAR | Status: DC | PRN
Start: 2023-02-19 — End: 2023-02-20

## 2023-02-19 NOTE — Nurses Notes (Addendum)
0903-Arrived to OP ONC in wheelchair and accompanied by daughter.  Here for Kanjinti infusion, possible Magnesium replacement, and Prolia injection.  To room 226. Junie Panning, RN  0917-Seated in vascular chair with wheels locked. Call light within reach.  Daughter in room.  Skin warm and dry.  Back brace in place.  Pt was injured after a fall in October and uses brace for support. Previously, had rehab at [facility], per daughter.  Currently, no pain while seated.  Pain worse with standing and ambulation.  HRR.  Lungs clear.  Non-pitting edema noted to bilateral lower extremities.  Appetite has improved, per pt.  Does have nausea every evening.  Suggested taking anti-nausea medication nightly to see if this would help.  Pt agreeable.  Continues to have numbness and tingling in hands and feet.  No other voiced complaints offered.  Junie Panning, RN  0935-Right upper chest PortACath accessed per protocol using sterile technique.  Excellent blood return and flush noted.  10ml blood wasted.  Labs then obtained from port.  Flushed with additional 20ml NS.  Transparent, non-bordered dressing applied.  Tolerated well.  Labs sent to main hospital lab through tube system.  Magnesium level 1.4 on 02/10/23.  Will administer 4 grams Magnesium Sulfate IV per instructions given by charge nurse per protocol.  Junie Panning, RN  0950-Magnesium Sulfate 4 grams IV infusion started.  Daughter and daughter-in-law in room. Junie Panning, RN  1043-Resting quietly.  Tolerating Magnesium Sulfate infusion.  No s/s of distress. Junie Panning, RN  1124-Kanjinti 525mg  IV infusion started. Junie Panning, RN  1154-Magnesium Sulfate and Kanjinti infusions completed at this time.   Tolerated well.  No voiced complaints offered. Junie Panning, RN  1225-PortACath deaccessed per protocol.  Adhesive bandage with gauze applied.  Tolerated well. Junie Panning, RN  1232-Verbalized c/o nausea.  New order received for Compazine 10 mg PO Q6H PRN.  Junie Panning,  RN  1236-Compazine 10 mg PO administered at this time. Junie Panning, RN  1243-Verbal and written instructions provided on Prolia.  Prolia 60 mg administered sub-q in left upper arm. Tolerated well.  Adhesive bandage applied. Junie Panning, RN  1300-Up to restroom with assistance from family.  States she is feeling better, nausea subsiding. Junie Panning, RN  1307-Left OP ONC in wheelchair and accompanied by daughter and daughter-in-law.  No acute distress noted.  VSS. Junie Panning, RN

## 2023-02-24 IMAGING — DX XRAY RIBS LT W/ PA CHEST
2 series · 5 of 5 positions shown · non-contrast
Comparison: None available.

﻿EXAM:  [DATE]      XRAY HIP W/PELVIS UNIL 2-3 VIEWS,XRAY RIBS LT W/ PA CHEST
INDICATION: Left-sided rib pain into breast, left hip pain.

[Series 1: PA · 0.14mm/px · 2 of 2 slices shown]
[im 1/2]
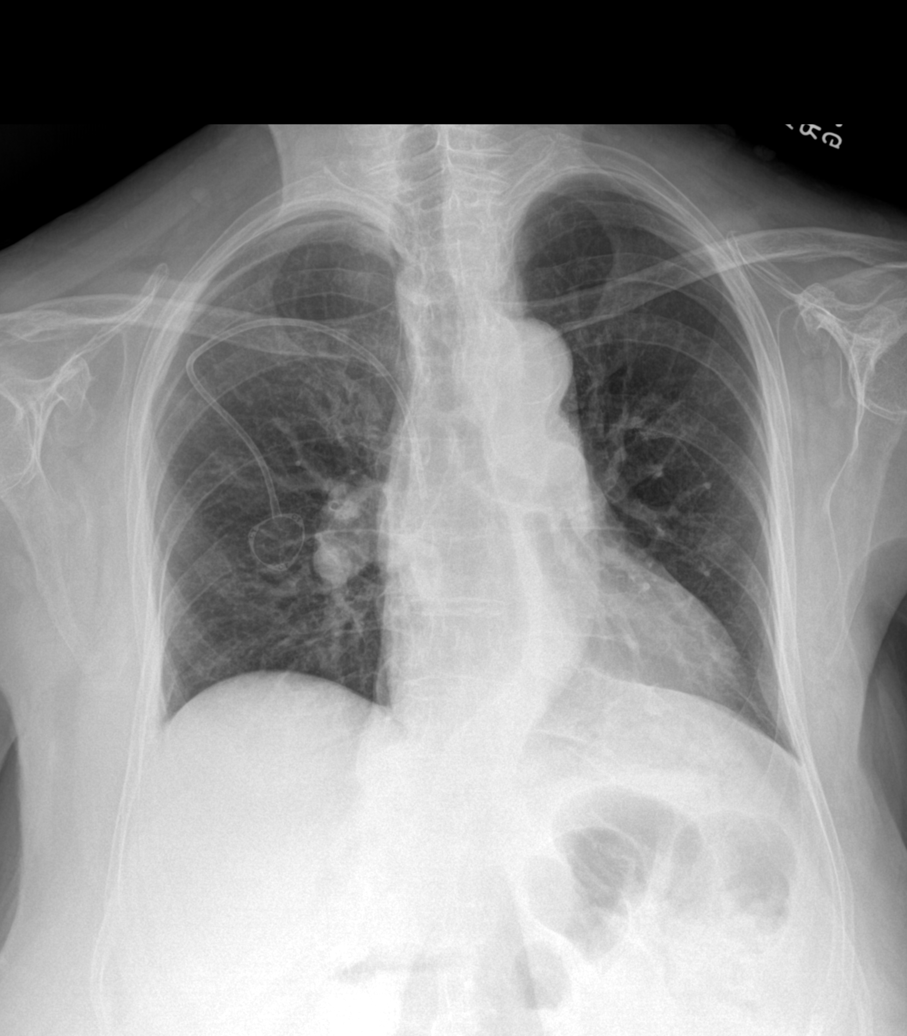
[im 2/2]
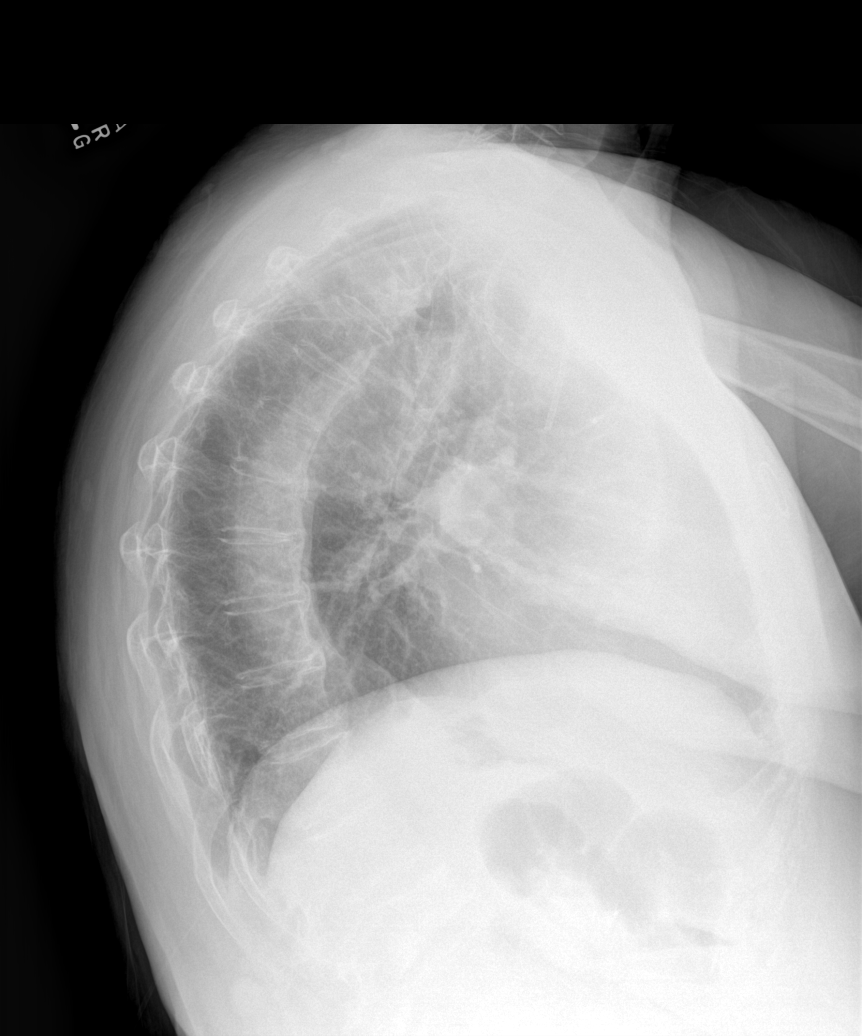

[Series 1: AP · 0.14mm/px · 3 of 3 slices shown]
[im 1/3]
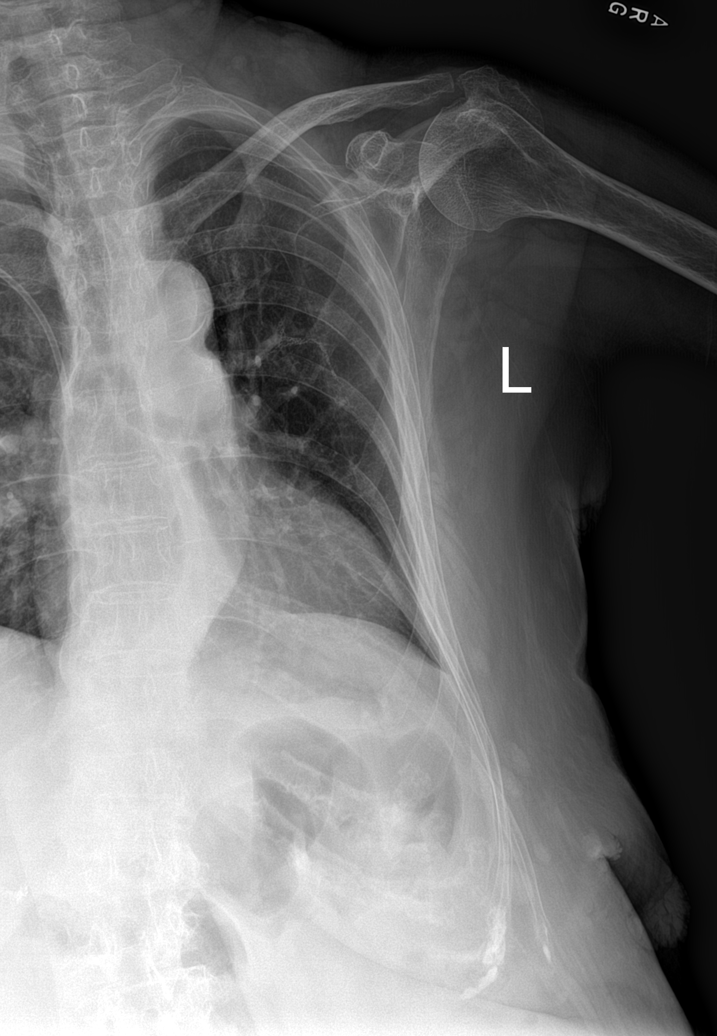
[im 2/3]
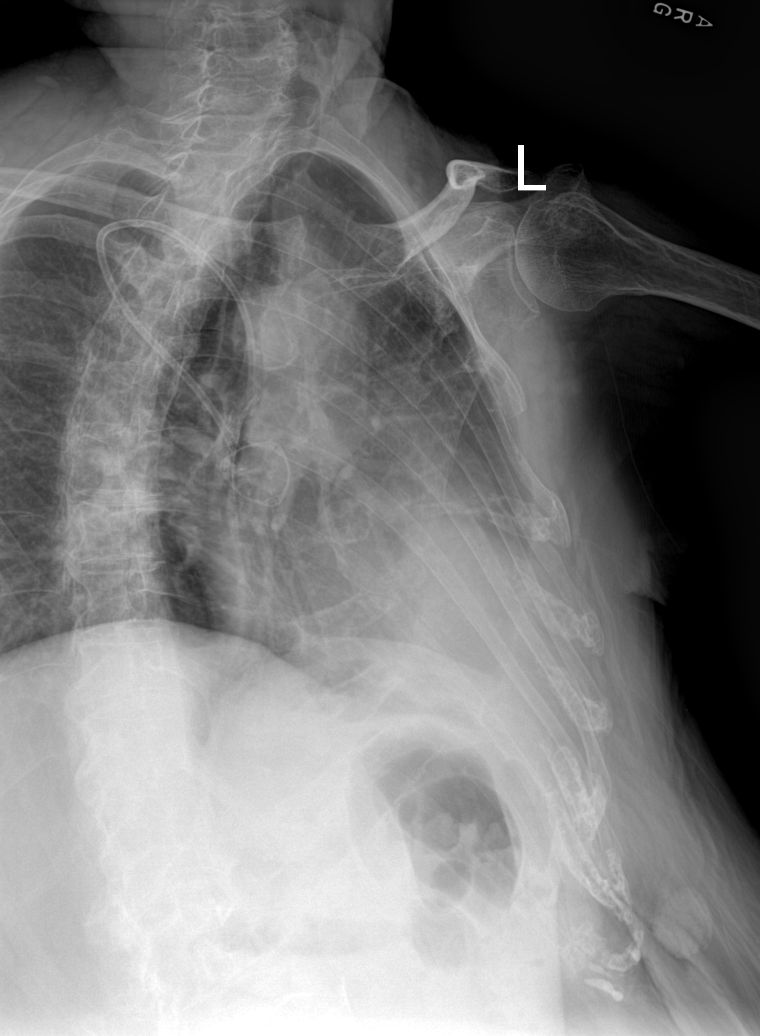
[im 3/3]
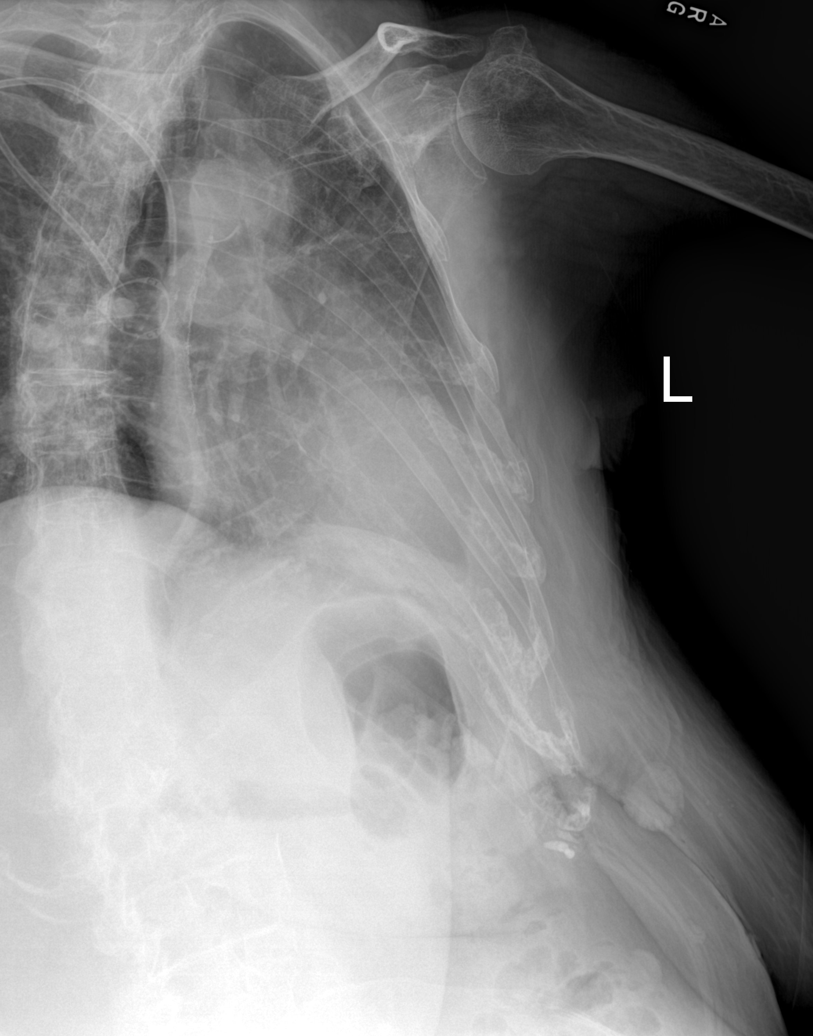

[5 of 5 positions shown; findings below may reference images not displayed]

LEFT HIP:  There is a long intramedullary rod transfixing an old fracture of the proximal left femoral shaft.  There is no acute fracture or hardware loosening.  

Moderate degenerative changes are noted involving both hips.
IMPRESSION: No acute fracture or hardware loosening. 

CHEST AND LEFT RIBS:  Frontal and lateral views of the chest and three views of the left ribs demonstrate no apparent rib fracture.  A Port-A-Cath is present.  There is mild cardiomegaly.  The aorta is atherosclerotic.  No infiltrates, effusions, or pulmonary masses are seen.
IMPRESSION: No rib fracture or acute disease.

## 2023-02-24 IMAGING — DX XRAY HIP W/PELVIS UNIL 2-3 VIEWS
2 series · 4 of 4 positions shown · non-contrast
Comparison: None available.

﻿EXAM:  [DATE]      XRAY HIP W/PELVIS UNIL 2-3 VIEWS,XRAY RIBS LT W/ PA CHEST
INDICATION: Left-sided rib pain into breast, left hip pain.

[AP]
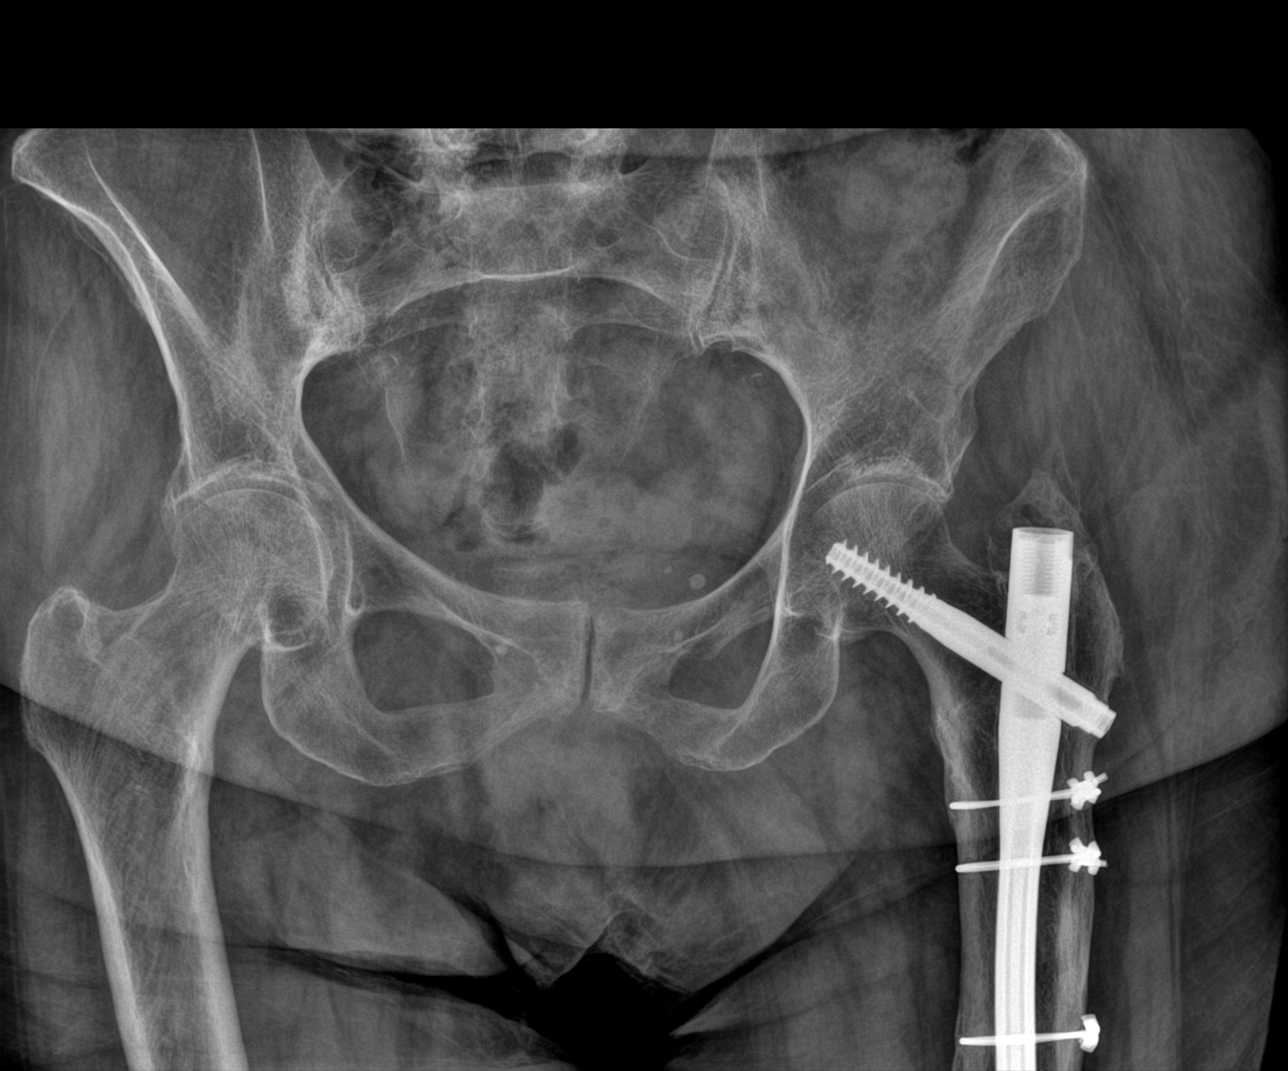

[Series 1: lateral · 0.14mm/px · 3 of 3 slices shown]
[im 1/3]
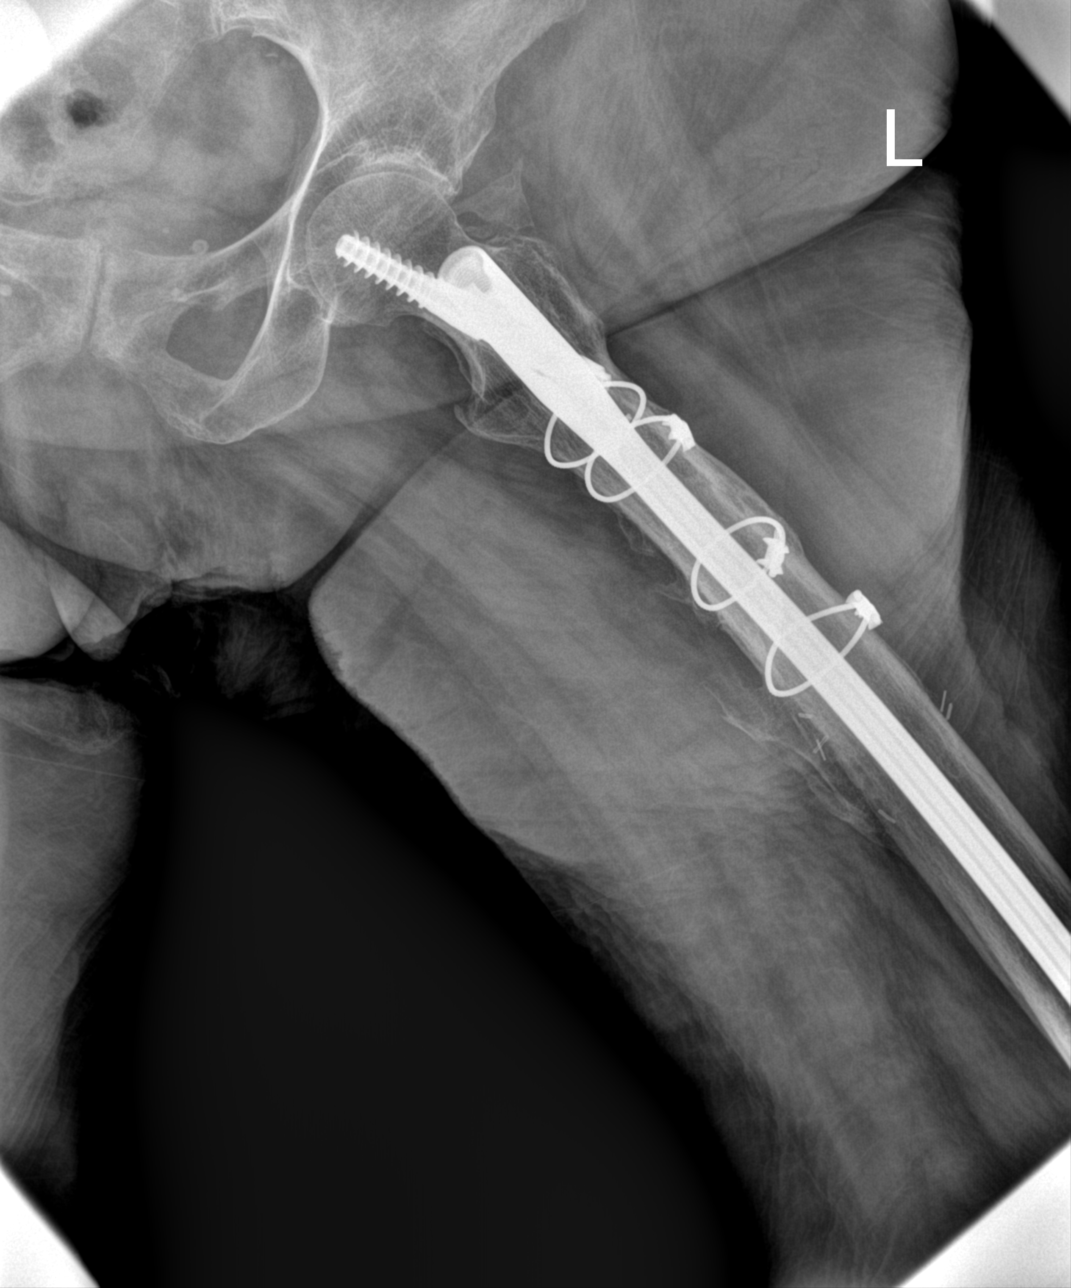
[im 2/3]
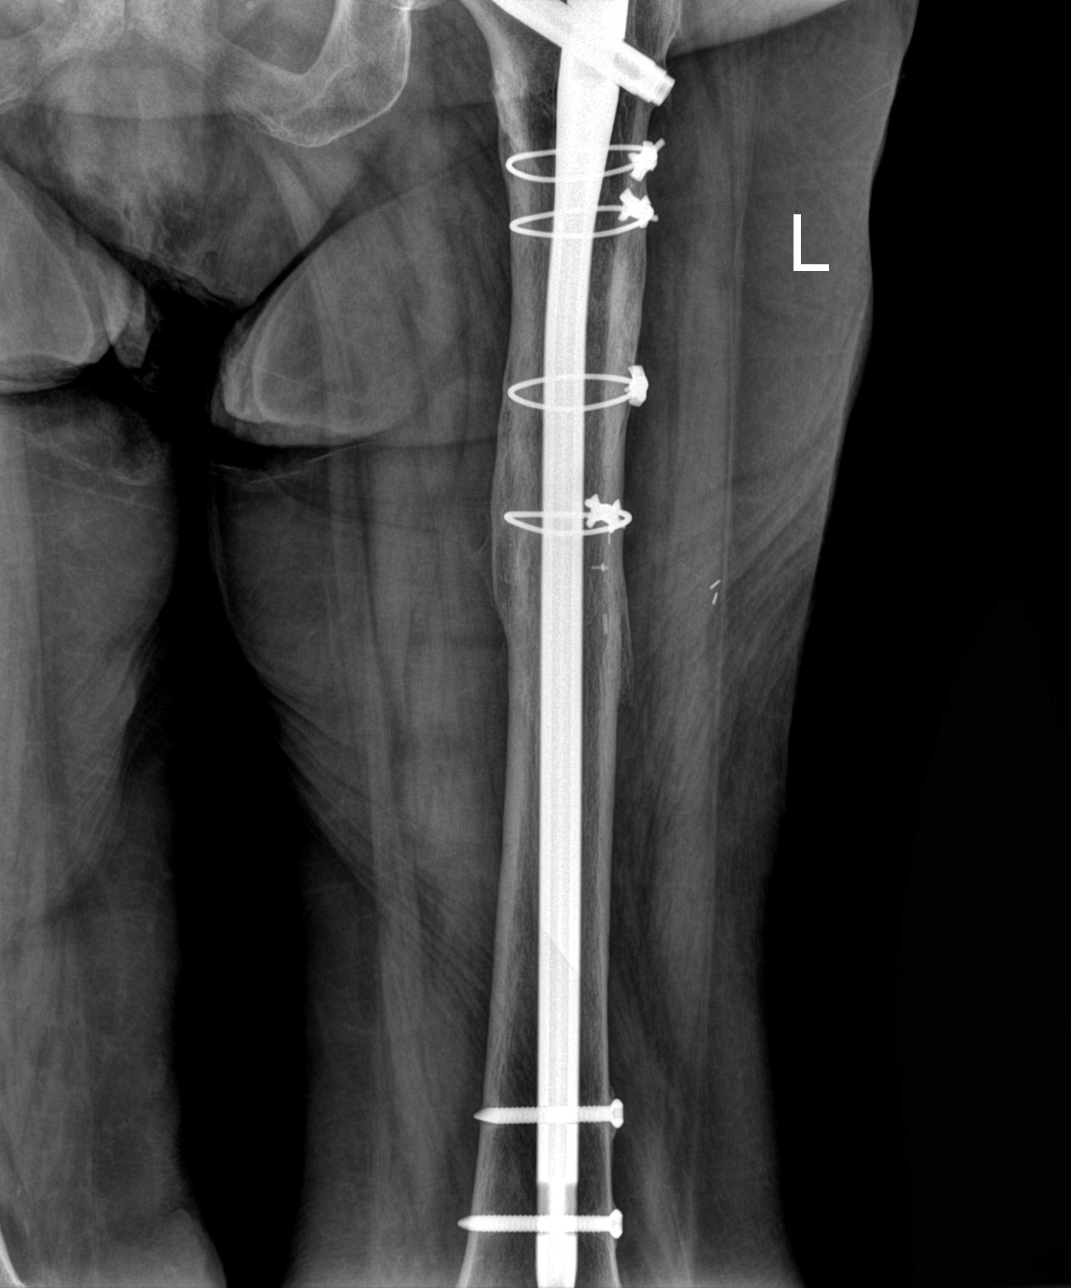
[im 3/3]
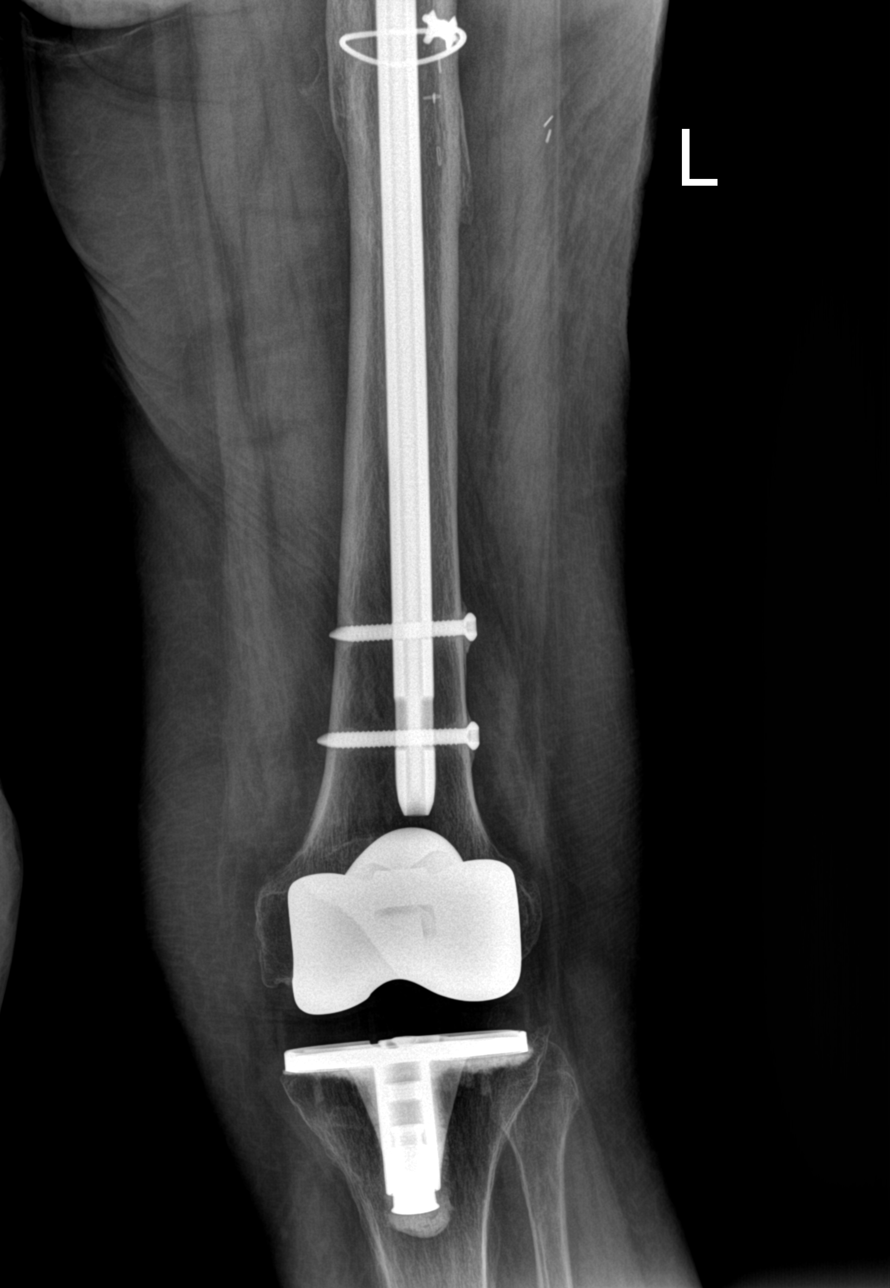

[4 of 4 positions shown; findings below may reference images not displayed]

LEFT HIP:  There is a long intramedullary rod transfixing an old fracture of the proximal left femoral shaft.  There is no acute fracture or hardware loosening.  

Moderate degenerative changes are noted involving both hips.
IMPRESSION: No acute fracture or hardware loosening. 

CHEST AND LEFT RIBS:  Frontal and lateral views of the chest and three views of the left ribs demonstrate no apparent rib fracture.  A Port-A-Cath is present.  There is mild cardiomegaly.  The aorta is atherosclerotic.  No infiltrates, effusions, or pulmonary masses are seen.
IMPRESSION: No rib fracture or acute disease.

## 2023-02-25 ENCOUNTER — Encounter (INDEPENDENT_AMBULATORY_CARE_PROVIDER_SITE_OTHER): Payer: Self-pay | Admitting: HEMATOLOGY-ONCOLOGY

## 2023-02-26 ENCOUNTER — Telehealth (INDEPENDENT_AMBULATORY_CARE_PROVIDER_SITE_OTHER): Payer: Self-pay | Admitting: HEMATOLOGY-ONCOLOGY

## 2023-02-26 NOTE — Telephone Encounter (Signed)
Daughter had called 12/10 stating that her mother received her first prolia shot and on Friday thru Sunday she had diarrhea and developed a rash at the site of the injection.  She also states that she had no fever, body aches just shoulder pain and itches with the rash. She took her mother to Dr Wynona Canes he did an xray of ribs and chest. She  has now rash on upper arm and breast with chest. She took her to Primary MD today she was diagnosed with Shingles and given medication.  She is now asking if she needs to bring her to the appt on Friday for the injection I transferred her to the infusion to get that answer.

## 2023-02-27 ENCOUNTER — Encounter (INDEPENDENT_AMBULATORY_CARE_PROVIDER_SITE_OTHER): Payer: Self-pay | Admitting: HEMATOLOGY-ONCOLOGY

## 2023-02-28 ENCOUNTER — Other Ambulatory Visit: Payer: Self-pay

## 2023-02-28 ENCOUNTER — Ambulatory Visit
Admission: RE | Admit: 2023-02-28 | Discharge: 2023-02-28 | Disposition: A | Discharge status: Home / Self Care | Payer: Medicare Other | Source: Ambulatory Visit | Attending: HEMATOLOGY-ONCOLOGY | Admitting: HEMATOLOGY-ONCOLOGY

## 2023-02-28 ENCOUNTER — Ambulatory Visit (INDEPENDENT_AMBULATORY_CARE_PROVIDER_SITE_OTHER): Payer: Self-pay | Admitting: HEMATOLOGY-ONCOLOGY

## 2023-02-28 ENCOUNTER — Encounter (HOSPITAL_COMMUNITY): Payer: Self-pay

## 2023-02-28 LAB — CBC WITH DIFF
BASOPHIL #: 0 10*3/uL (ref 0.00–0.10)
BASOPHIL %: 1 % (ref 0–1)
EOSINOPHIL #: 0 10*3/uL (ref 0.00–0.50)
EOSINOPHIL %: 1 % (ref 1–7)
HCT: 33.1 % (ref 31.2–41.9)
HGB: 10.7 g/dL — ABNORMAL LOW (ref 10.9–14.3)
LYMPHOCYTE #: 1.4 10*3/uL (ref 1.00–3.00)
LYMPHOCYTE %: 41 % (ref 16–44)
MCH: 27.9 pg (ref 24.7–32.8)
MCHC: 32.3 g/dL (ref 32.3–35.6)
MCV: 86.3 fL (ref 75.5–95.3)
MONOCYTE #: 0.3 10*3/uL (ref 0.30–1.00)
MONOCYTE %: 9 % (ref 5–13)
MPV: 9 fL (ref 7.9–10.8)
NEUTROPHIL #: 1.6 10*3/uL — ABNORMAL LOW (ref 1.85–7.80)
NEUTROPHIL %: 48 % (ref 43–77)
PLATELETS: 122 10*3/uL — ABNORMAL LOW (ref 140–440)
RBC: 3.83 10*6/uL (ref 3.63–4.92)
RDW: 15.7 % (ref 12.3–17.7)
WBC: 3.3 10*3/uL — ABNORMAL LOW (ref 3.8–11.8)

## 2023-02-28 LAB — COMPREHENSIVE METABOLIC PANEL, NON-FASTING
ALBUMIN/GLOBULIN RATIO: 1.3 (ref 0.8–1.4)
ALBUMIN: 3.6 g/dL (ref 3.5–5.7)
ALKALINE PHOSPHATASE: 134 U/L — ABNORMAL HIGH (ref 34–104)
ALT (SGPT): 16 U/L (ref 7–52)
ANION GAP: 7 mmol/L (ref 4–13)
AST (SGOT): 21 U/L (ref 13–39)
BILIRUBIN TOTAL: 0.5 mg/dL (ref 0.3–1.0)
BUN/CREA RATIO: 16 (ref 6–22)
BUN: 14 mg/dL (ref 7–25)
CALCIUM, CORRECTED: 9.5 mg/dL (ref 8.9–10.8)
CALCIUM: 9.2 mg/dL (ref 8.6–10.3)
CHLORIDE: 106 mmol/L (ref 98–107)
CO2 TOTAL: 24 mmol/L (ref 21–31)
CREATININE: 0.87 mg/dL (ref 0.60–1.30)
ESTIMATED GFR: 67 mL/min/{1.73_m2} (ref >59)
GLOBULIN: 2.7 (ref 2.0–3.5)
GLUCOSE: 160 mg/dL — ABNORMAL HIGH (ref 74–109)
OSMOLALITY, CALCULATED: 278 mosm/kg (ref 270–290)
POTASSIUM: 4.4 mmol/L (ref 3.5–5.1)
PROTEIN TOTAL: 6.3 g/dL — ABNORMAL LOW (ref 6.4–8.9)
SODIUM: 137 mmol/L (ref 136–145)

## 2023-02-28 LAB — MAGNESIUM: MAGNESIUM: 1.6 mg/dL — ABNORMAL LOW (ref 1.9–2.7)

## 2023-02-28 MED ORDER — MAGNESIUM SULFATE 1 GRAM/100 ML IN DEXTROSE 5 % INTRAVENOUS PIGGYBACK
1.0000 g | INJECTION | INTRAVENOUS | Status: AC
Start: 2023-02-28 — End: 2023-02-28
  Administered 2023-02-28 (×2): 0 g via INTRAVENOUS
  Administered 2023-02-28 (×2): 1 g via INTRAVENOUS

## 2023-02-28 MED ORDER — MAGNESIUM SULFATE 1 GRAM/100 ML IN DEXTROSE 5 % INTRAVENOUS PIGGYBACK
INJECTION | INTRAVENOUS | Status: AC
Start: 2023-02-28 — End: 2023-02-28
  Filled 2023-02-28: qty 200

## 2023-02-28 NOTE — Nurses Notes (Signed)
1610: Patient, daughter and husband to room 223 to draw labs and await  results. Reports continued back pain from recent. Also has active shingles-with rash/to left chest,axilla and shoulder.Evans Lance, RN  [phone]: Right port accessed per protocol, labs drawn/sent.Evans Lance, RN  [phone] : A total of 2 gms macnesium given IV-each over 30 minutes per pharmacy. Evans Lance, RN  [phone]: Infusions complete.  Port de-accessed per policy. Evans Lance, RN  1340: Pt left via w/c with family. No complaints voiced other than back pain. Evans Lance, RN

## 2023-03-05 ENCOUNTER — Encounter (INDEPENDENT_AMBULATORY_CARE_PROVIDER_SITE_OTHER): Payer: Self-pay | Admitting: HEMATOLOGY-ONCOLOGY

## 2023-03-06 ENCOUNTER — Encounter (INDEPENDENT_AMBULATORY_CARE_PROVIDER_SITE_OTHER): Payer: Self-pay | Admitting: HEMATOLOGY-ONCOLOGY

## 2023-03-07 ENCOUNTER — Ambulatory Visit
Admission: RE | Admit: 2023-03-07 | Discharge: 2023-03-07 | Disposition: A | Discharge status: Home / Self Care | Payer: Medicare Other | Source: Ambulatory Visit | Attending: HEMATOLOGY-ONCOLOGY | Admitting: HEMATOLOGY-ONCOLOGY

## 2023-03-07 ENCOUNTER — Other Ambulatory Visit: Payer: Self-pay

## 2023-03-07 LAB — COMPREHENSIVE METABOLIC PANEL, NON-FASTING
ALBUMIN/GLOBULIN RATIO: 1.3 (ref 0.8–1.4)
ALBUMIN: 3.5 g/dL (ref 3.5–5.7)
ALKALINE PHOSPHATASE: 130 U/L — ABNORMAL HIGH (ref 34–104)
ALT (SGPT): 17 U/L (ref 7–52)
ANION GAP: 3 mmol/L — ABNORMAL LOW (ref 4–13)
AST (SGOT): 24 U/L (ref 13–39)
BILIRUBIN TOTAL: 0.5 mg/dL (ref 0.3–1.0)
BUN/CREA RATIO: 18 (ref 6–22)
BUN: 16 mg/dL (ref 7–25)
CALCIUM, CORRECTED: 10.5 mg/dL (ref 8.9–10.8)
CALCIUM: 10.1 mg/dL (ref 8.6–10.3)
CHLORIDE: 105 mmol/L (ref 98–107)
CO2 TOTAL: 28 mmol/L (ref 21–31)
CREATININE: 0.87 mg/dL (ref 0.60–1.30)
ESTIMATED GFR: 67 mL/min/{1.73_m2} (ref >59)
GLOBULIN: 2.8 (ref 2.0–3.5)
GLUCOSE: 214 mg/dL — ABNORMAL HIGH (ref 74–109)
OSMOLALITY, CALCULATED: 280 mosm/kg (ref 270–290)
POTASSIUM: 4.5 mmol/L (ref 3.5–5.1)
PROTEIN TOTAL: 6.3 g/dL — ABNORMAL LOW (ref 6.4–8.9)
SODIUM: 136 mmol/L (ref 136–145)

## 2023-03-07 LAB — CBC WITH DIFF
BASOPHIL #: 0 10*3/uL (ref 0.00–0.10)
BASOPHIL %: 1 % (ref 0–1)
EOSINOPHIL #: 0 10*3/uL (ref 0.00–0.50)
EOSINOPHIL %: 1 % (ref 1–7)
HCT: 33.7 % (ref 31.2–41.9)
HGB: 11.1 g/dL (ref 10.9–14.3)
LYMPHOCYTE #: 1.5 10*3/uL (ref 1.00–3.00)
LYMPHOCYTE %: 38 % (ref 16–44)
MCH: 28 pg (ref 24.7–32.8)
MCHC: 32.8 g/dL (ref 32.3–35.6)
MCV: 85.4 fL (ref 75.5–95.3)
MONOCYTE #: 0.3 10*3/uL (ref 0.30–1.00)
MONOCYTE %: 7 % (ref 5–13)
MPV: 8.5 fL (ref 7.9–10.8)
NEUTROPHIL #: 2.1 10*3/uL (ref 1.85–7.80)
NEUTROPHIL %: 54 % (ref 43–77)
PLATELETS: 145 10*3/uL (ref 140–440)
RBC: 3.94 10*6/uL (ref 3.63–4.92)
RDW: 15.3 % (ref 12.3–17.7)
WBC: 3.9 10*3/uL (ref 3.8–11.8)

## 2023-03-07 LAB — MAGNESIUM: MAGNESIUM: 1.8 mg/dL — ABNORMAL LOW (ref 1.9–2.7)

## 2023-03-07 NOTE — Nurses Notes (Signed)
1040- Arrived to unit in wheelchair accompanied by daughter. Here for labs, possible Mag replacement. States she didn't sleep much last night. Leeanne Deed, RN   [phone]- VSS. Assessments complete. Right chest Portacath accessed, blood return noted, labs collected. Leeanne Deed, RN   [phone]- Mag level 1.8, no replacement needed. Portacath flushed, deaccessed, pressure dressing applied. Patient going to finish lunch before she leaves. Leeanne Deed, RN   [phone]- Left unit in wheelchair with daughter and belongings. Leeanne Deed, RN

## 2023-03-10 ENCOUNTER — Other Ambulatory Visit (INDEPENDENT_AMBULATORY_CARE_PROVIDER_SITE_OTHER): Payer: Self-pay | Admitting: HEMATOLOGY-ONCOLOGY

## 2023-03-13 ENCOUNTER — Encounter (INDEPENDENT_AMBULATORY_CARE_PROVIDER_SITE_OTHER): Payer: Self-pay | Admitting: HEMATOLOGY-ONCOLOGY

## 2023-03-13 ENCOUNTER — Ambulatory Visit (INDEPENDENT_AMBULATORY_CARE_PROVIDER_SITE_OTHER): Payer: Self-pay | Admitting: HEMATOLOGY-ONCOLOGY

## 2023-03-13 ENCOUNTER — Ambulatory Visit (INDEPENDENT_AMBULATORY_CARE_PROVIDER_SITE_OTHER): Payer: Self-pay

## 2023-03-13 ENCOUNTER — Ambulatory Visit: Payer: Medicare Other

## 2023-03-14 ENCOUNTER — Encounter (HOSPITAL_COMMUNITY): Payer: Self-pay

## 2023-03-14 ENCOUNTER — Ambulatory Visit (INDEPENDENT_AMBULATORY_CARE_PROVIDER_SITE_OTHER)
Admission: RE | Admit: 2023-03-14 | Discharge: 2023-03-14 | Disposition: A | Discharge status: Home / Self Care | Payer: Medicare Other | Source: Ambulatory Visit | Attending: HEMATOLOGY-ONCOLOGY | Admitting: HEMATOLOGY-ONCOLOGY

## 2023-03-14 ENCOUNTER — Ambulatory Visit
Admission: RE | Admit: 2023-03-14 | Discharge: 2023-03-14 | Disposition: A | Discharge status: Home / Self Care | Payer: Medicare Other | Source: Ambulatory Visit | Attending: HEMATOLOGY-ONCOLOGY | Admitting: HEMATOLOGY-ONCOLOGY

## 2023-03-14 ENCOUNTER — Encounter (INDEPENDENT_AMBULATORY_CARE_PROVIDER_SITE_OTHER): Payer: Self-pay | Admitting: NURSE PRACTITIONER

## 2023-03-14 ENCOUNTER — Ambulatory Visit (HOSPITAL_BASED_OUTPATIENT_CLINIC_OR_DEPARTMENT_OTHER): Payer: Medicare Other | Admitting: NURSE PRACTITIONER

## 2023-03-14 ENCOUNTER — Other Ambulatory Visit: Payer: Self-pay

## 2023-03-14 VITALS — BP 145/66 | HR 76 | Temp 97.1°F | Ht 64.0 in | Wt 193.2 lb

## 2023-03-14 VITALS — BP 138/73 | HR 69 | Temp 97.2°F | Resp 18

## 2023-03-14 DIAGNOSIS — Z5112 Encounter for antineoplastic immunotherapy: Secondary | ICD-10-CM | POA: Insufficient documentation

## 2023-03-14 DIAGNOSIS — Z85118 Personal history of other malignant neoplasm of bronchus and lung: Secondary | ICD-10-CM

## 2023-03-14 DIAGNOSIS — Z8619 Personal history of other infectious and parasitic diseases: Secondary | ICD-10-CM | POA: Insufficient documentation

## 2023-03-14 DIAGNOSIS — C50912 Malignant neoplasm of unspecified site of left female breast: Secondary | ICD-10-CM | POA: Insufficient documentation

## 2023-03-14 DIAGNOSIS — Z79899 Other long term (current) drug therapy: Secondary | ICD-10-CM | POA: Insufficient documentation

## 2023-03-14 DIAGNOSIS — Z8542 Personal history of malignant neoplasm of other parts of uterus: Secondary | ICD-10-CM

## 2023-03-14 DIAGNOSIS — C50919 Malignant neoplasm of unspecified site of unspecified female breast: Secondary | ICD-10-CM

## 2023-03-14 DIAGNOSIS — I82412 Acute embolism and thrombosis of left femoral vein: Secondary | ICD-10-CM

## 2023-03-14 DIAGNOSIS — Z1731 Human epidermal growth factor receptor 2 positive status: Secondary | ICD-10-CM | POA: Insufficient documentation

## 2023-03-14 DIAGNOSIS — Z171 Estrogen receptor negative status [ER-]: Secondary | ICD-10-CM | POA: Insufficient documentation

## 2023-03-14 DIAGNOSIS — Z9221 Personal history of antineoplastic chemotherapy: Secondary | ICD-10-CM | POA: Insufficient documentation

## 2023-03-14 DIAGNOSIS — Z9049 Acquired absence of other specified parts of digestive tract: Secondary | ICD-10-CM | POA: Insufficient documentation

## 2023-03-14 DIAGNOSIS — C773 Secondary and unspecified malignant neoplasm of axilla and upper limb lymph nodes: Secondary | ICD-10-CM | POA: Insufficient documentation

## 2023-03-14 DIAGNOSIS — S32000D Wedge compression fracture of unspecified lumbar vertebra, subsequent encounter for fracture with routine healing: Secondary | ICD-10-CM

## 2023-03-14 LAB — COMPREHENSIVE METABOLIC PANEL, NON-FASTING
ALBUMIN/GLOBULIN RATIO: 1.2 (ref 0.8–1.4)
ALBUMIN: 3.4 g/dL — ABNORMAL LOW (ref 3.5–5.7)
ALKALINE PHOSPHATASE: 135 U/L — ABNORMAL HIGH (ref 34–104)
ALT (SGPT): 50 U/L (ref 7–52)
ANION GAP: 1 mmol/L — ABNORMAL LOW (ref 4–13)
AST (SGOT): 48 U/L — ABNORMAL HIGH (ref 13–39)
BILIRUBIN TOTAL: 0.4 mg/dL (ref 0.3–1.0)
BUN/CREA RATIO: 21 (ref 6–22)
BUN: 20 mg/dL (ref 7–25)
CALCIUM, CORRECTED: 10.9 mg/dL — ABNORMAL HIGH (ref 8.9–10.8)
CALCIUM: 10.4 mg/dL — ABNORMAL HIGH (ref 8.6–10.3)
CHLORIDE: 107 mmol/L (ref 98–107)
CO2 TOTAL: 31 mmol/L (ref 21–31)
CREATININE: 0.94 mg/dL (ref 0.60–1.30)
ESTIMATED GFR: 61 mL/min/{1.73_m2} (ref >59)
GLOBULIN: 2.9 (ref 2.0–3.5)
GLUCOSE: 136 mg/dL — ABNORMAL HIGH (ref 74–109)
OSMOLALITY, CALCULATED: 282 mosm/kg (ref 270–290)
POTASSIUM: 4.5 mmol/L (ref 3.5–5.1)
PROTEIN TOTAL: 6.3 g/dL — ABNORMAL LOW (ref 6.4–8.9)
SODIUM: 139 mmol/L (ref 136–145)

## 2023-03-14 LAB — CBC WITH DIFF
BASOPHIL #: 0 10*3/uL (ref 0.00–0.10)
BASOPHIL %: 1 % (ref 0–1)
EOSINOPHIL #: 0.1 10*3/uL (ref 0.00–0.50)
EOSINOPHIL %: 2 % (ref 1–7)
HCT: 32.8 % (ref 31.2–41.9)
HGB: 10.9 g/dL (ref 10.9–14.3)
LYMPHOCYTE #: 1.7 10*3/uL (ref 1.00–3.00)
LYMPHOCYTE %: 46 % — ABNORMAL HIGH (ref 16–44)
MCH: 28.4 pg (ref 24.7–32.8)
MCHC: 33.1 g/dL (ref 32.3–35.6)
MCV: 85.8 fL (ref 75.5–95.3)
MONOCYTE #: 0.3 10*3/uL (ref 0.30–1.00)
MONOCYTE %: 9 % (ref 5–13)
MPV: 9 fL (ref 7.9–10.8)
NEUTROPHIL #: 1.6 10*3/uL — ABNORMAL LOW (ref 1.85–7.80)
NEUTROPHIL %: 42 % — ABNORMAL LOW (ref 43–77)
PLATELETS: 154 10*3/uL (ref 140–440)
RBC: 3.83 10*6/uL (ref 3.63–4.92)
RDW: 15.6 % (ref 12.3–17.7)
WBC: 3.7 10*3/uL — ABNORMAL LOW (ref 3.8–11.8)

## 2023-03-14 LAB — MAGNESIUM: MAGNESIUM: 1.7 mg/dL — ABNORMAL LOW (ref 1.9–2.7)

## 2023-03-14 MED ORDER — TRASTUZUMAB-ANNS 420 MG INTRAVENOUS SOLUTION
6.0000 mg/kg | Freq: Once | INTRAVENOUS | Status: AC
Start: 2023-03-14 — End: 2023-03-14
  Administered 2023-03-14: 525 mg via INTRAVENOUS
  Administered 2023-03-14: 0 mg via INTRAVENOUS
  Filled 2023-03-14: qty 25

## 2023-03-14 MED ORDER — SODIUM CHLORIDE 0.9% FLUSH BAG - 250 ML
INTRAVENOUS | Status: DC | PRN
Start: 2023-03-14 — End: 2023-03-15

## 2023-03-14 MED ORDER — DIPHENHYDRAMINE 50 MG/ML INJECTION SOLUTION
25.0000 mg | Freq: Once | INTRAMUSCULAR | Status: DC | PRN
Start: 2023-03-14 — End: 2023-03-15

## 2023-03-14 MED ORDER — ALBUTEROL SULFATE 2.5 MG/3 ML (0.083 %) SOLUTION FOR NEBULIZATION
2.5000 mg | INHALATION_SOLUTION | Freq: Once | RESPIRATORY_TRACT | Status: DC | PRN
Start: 2023-03-14 — End: 2023-03-15

## 2023-03-14 MED ORDER — MAGNESIUM SULFATE 1 GRAM/100 ML IN DEXTROSE 5 % INTRAVENOUS PIGGYBACK
1.0000 g | INJECTION | INTRAVENOUS | Status: AC
Start: 2023-03-14 — End: 2023-03-14
  Administered 2023-03-14: 1 g via INTRAVENOUS
  Administered 2023-03-14 (×2): 0 g via INTRAVENOUS
  Administered 2023-03-14: 1 g via INTRAVENOUS

## 2023-03-14 MED ORDER — HYDROCORTISONE SOD SUCCINATE 100 MG/2 ML VIAL WRAPPER
100.0000 mg | Freq: Once | INTRAMUSCULAR | Status: DC | PRN
Start: 2023-03-14 — End: 2023-03-15

## 2023-03-14 MED ORDER — DEXTROSE 5% IN WATER (D5W) FLUSH BAG - 250 ML
INTRAVENOUS | Status: DC | PRN
Start: 2023-03-14 — End: 2023-03-15

## 2023-03-14 MED ORDER — MAGNESIUM SULFATE 1 GRAM/100 ML IN DEXTROSE 5 % INTRAVENOUS PIGGYBACK
INJECTION | INTRAVENOUS | Status: AC
Start: 2023-03-14 — End: 2023-03-14
  Filled 2023-03-14: qty 200

## 2023-03-14 MED ORDER — EPINEPHRINE 1 MG/ML (1 ML) INJECTION SOLUTION
0.3000 mg | Freq: Once | INTRAMUSCULAR | Status: DC | PRN
Start: 2023-03-14 — End: 2023-03-15

## 2023-03-14 MED ORDER — FAMOTIDINE (PF) 20 MG/2 ML INTRAVENOUS SOLUTION
20.0000 mg | Freq: Once | INTRAVENOUS | Status: DC | PRN
Start: 2023-03-14 — End: 2023-03-15

## 2023-03-14 MED ORDER — MEPERIDINE (PF) 25 MG/ML INJECTION SOLUTION
12.5000 mg | Freq: Once | INTRAMUSCULAR | Status: DC | PRN
Start: 2023-03-14 — End: 2023-03-15

## 2023-03-14 MED ORDER — ALBUTEROL SULFATE HFA 90 MCG/ACTUATION AEROSOL INHALER - RN
2.0000 | Freq: Once | RESPIRATORY_TRACT | Status: DC | PRN
Start: 2023-03-14 — End: 2023-03-15

## 2023-03-14 MED ORDER — DIPHENHYDRAMINE 50 MG/ML INJECTION SOLUTION
50.0000 mg | Freq: Once | INTRAMUSCULAR | Status: DC | PRN
Start: 2023-03-14 — End: 2023-03-15

## 2023-03-14 NOTE — Nurses Notes (Signed)
1000 - Patient to room via wheelchair with family for treatment today. Assessment complete. Lungs clear throughout lung fields. Abdomen soft and non-tender, bowel sounds present. Bilateral lower extremity edema noted. Patient denies any pain or discomfort at this time but says she has constant back pain when up and ambulating.  Carolynn Serve, RN  Patient Assessment/Symptom Management Patient Has No MD Appointment Today   Key: (+) Symptom present           (-)  Symptom not present If Symptom is Positive(+) a Nursing Note is required   Edema +   Uncontrolled Nausea -   Vomiting -   Inability to eat/drink -   Mouth Sores -   Diarrhea -   Constipation (? Last BM) -   Fatigue that interferes with ADL's -   Numbness/Tingling -change -   Other -   Fever/Signs & Symptoms of infection -   Nurse Initials JD   1002 - Labs reviewed and are good for treatment. Orders released to pharmacy. Carolynn Serve, RN  1005 - PAC accessed, excellent blood return noted. Carolynn Serve, RN  1010 - 2 grams of Magnesium ordered for Magnesium of 1.7. 1st Magnesium rider started. Carolynn Serve, RN  [phone] - 1st Magnesium rider complete. 2nd Magnesium rider started. Carolynn Serve, RN  [phone] - Kanjinti infusion started. Carolynn Serve, RN  [phone] - 2nd Magnesium rider complete. Carolynn Serve, RN  [phone] - Kanjinti infusion complete. Line flushing. Carolynn Serve, RN  1125 - PAC flushed with NS and deaccessed. Gauze dressing and adhesive bandage applied. Carolynn Serve, RN  [phone] - Patient left via wheelchair with family at this time. Carolynn Serve, RN

## 2023-03-14 NOTE — Cancer Center Note (Signed)
Department of Hematology/Oncology  Progress Note   Name: Kelsey Preston  VHQ:[id number]  Date of Birth: Jun 30, 1940  Encounter Date: 03/14/2023    REFERRING PROVIDER:  Charm Rings, MD  [address]  [place],  [place] [address]    REASON FOR OFFICE VISIT:  Breast Cancer     HISTORY OF PRESENT ILLNESS:  Kelsey Preston is a 82 y.o. female who presents today for follow up of breast cancer     The patient has a history of non-small-cell lung cancer, status post lobectomy in 2014.      She also has a history of endometrial cancer, and is status post surgical resection of that cancer as well.    More recently, the patient was found to have a left axillary lesion measuring 2.3 cm on PET-CT scan.  For some reason, I can not find an actual size measurement on the pathology report.  Multiple imaging studies of the breast were performed, but no primary lesion was found.  The malignant lesion was definitively classified as a lymph node.    Receptor staining showed that it was ER negative and HER2 Neu positive.  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    09/10/2022: The patient is here for follow up of early stage breast cancer.  She is doing well and denies any new problems at this time.    09/27/2022: The patient is here for follow up breast cancer.  She is doing well at this time and has no new complaints.    10/18/2022: the patient is here for follow up of breast cancer. She has been on treatment with paclitaxel and trastuzumab. She has been hospitalized since the last visit due to neutropenia and hypotension. She has also had electrolyte issues, with her magnesium level being somewhat low. She is taking over-the-counter magnesium oxide.    11/08/2022: The patient is here for follow up of breast cancer.  She has been on treatment with weekly paclitaxel and trastuzumab.  She did end up in the hospital relatively recently with some low cell counts, but it appears to have resolved.    12/06/2022: The patient is here for follow  up of breast cancer.  She has been doing relatively well but has noticed some peripheral neuropathy.    12/27/2022: The patient is here today for follow up of breast cancer. She had her last Echo completed on 12/25/2022. Her Echo completed on 05/31/22 showed an Ejection Fraction of 60-65%. Her follow up Echo on 09/24/22 showed EF of 54% and Echo on 09/24/22 showed EF of 50-55%. Dr. [name] was notified of these results and he recommended holding Kinjinti today and repeating Echo prior to her next dose of Kinjinti. She has require magnesium replacement and the family is concerned since she will be only coming every 3 weeks for treatment. We will work on having her scheduled to return for weekly Magnesium lab and replacement PRN. She will have a follow up PET ordered and scheduled for restaging since she has completed chemotherapy part of her treat. We will also refer her to RT for evaluation, per Dr. Virgilio Frees treatment plan.     02/11/2023: The patient is here for follow up of breast cancer. She was on trastuzumab, and it was held due to a percentage decline in her ejection fraction. She had a recent follow up echocardiogram which showed complete recovery.     03/14/2023: The patient is here for follow up of breast cancer. She has resumed trastuzumab and tolerating  this well. She states that she has had shingles on her left chest around side and back. She was placed on Valacyclovir and has completed this. She is currently on Gabapentin 100 mg TID to help with the pain but she is still having some pain and burning in the affected area.     ROS:   Pertinent review of systems as discussed in HPI    HISTORY:  Past Medical History:   Diagnosis Date    Diabetes mellitus, type 2 (CMS HCC)     Embolism (CMS HCC)     left leg    Endometrial cancer (CMS HCC)     HTN (hypertension)     Hx of breast cancer     Lung cancer (CMS HCC)     Macular degeneration (senile) of retina      Past Surgical History:   Procedure Laterality Date     HIP SURGERY Left     HX CHOLECYSTECTOMY      HX COLONOSCOPY      HX HYSTERECTOMY      HX LOBECTOMY Left     LUNG CANCER SURGERY      PORTACATH PLACEMENT       Social History     Socioeconomic History    Marital status: Married     Spouse name: Not on file    Number of children: Not on file    Years of education: Not on file    Highest education level: Not on file   Occupational History    Not on file   Tobacco Use    Smoking status: Never    Smokeless tobacco: Never   Vaping Use    Vaping status: Never Used   Substance and Sexual Activity    Alcohol use: Never    Drug use: Never    Sexual activity: Not Currently   Other Topics Concern    Not on file   Social History Narrative    Not on file     Social Determinants of Health     Financial Resource Strain: Medium Risk (01/29/2023)    Received from [facility]    Overall Financial Resource Strain (CARDIA)     Difficulty of Paying Living Expenses: Somewhat hard   Transportation Needs: No Transportation Needs (01/29/2023)    Received from [facility] - Transportation     Lack of Transportation (Medical): No     Lack of Transportation (Non-Medical): No   Social Connections: Moderately Integrated (01/29/2023)    Received from [facility]    Social Connection and Isolation Panel [NHANES]     Frequency of Communication with Friends and Family: More than three times a week     Frequency of Social Gatherings with Friends and Family: More than three times a week     Attends Religious Services: More than 4 times per year     Active Member of [employer] or Organizations: No     Attends Banker Meetings: Never     Marital Status: Married   Catering manager Violence: Not At Risk (01/29/2023)    Received from [employer], Afraid, Rape, and Kick questionnaire     Fear of Current or Ex-Partner: No     Emotionally Abused: No     Physically Abused: No     Sexually Abused: No   Housing Stability: Low Risk  (01/29/2023)    Received from  [facility] Stability  Vital Sign     Unable to Pay for Housing in the Last Year: No     Number of Times Moved in the Last Year: 1     Homeless in the Last Year: No     Family Medical History:       Problem Relation (Age of Onset)    Cancer Brother            Current Outpatient Medications   Medication Sig    atorvastatin (LIPITOR) 40 mg Oral Tablet Take 1 Tablet (40 mg total) by mouth Every evening for 30 days    carvediloL (COREG) 6.25 mg Oral Tablet Take 1 Tablet (6.25 mg total) by mouth Twice daily with food for 30 days (Patient taking differently: Take 2 Tablets (12.5 mg total) by mouth Twice daily with food)    docusate sodium (COLACE) 100 mg Oral Capsule Take 1 Capsule (100 mg total) by mouth Twice daily    esomeprazole magnesium (NEXIUM) 40 mg Oral Capsule, Delayed Release(E.C.) Take 1 Capsule (40 mg total) by mouth Every morning before breakfast    ezetimibe (ZETIA) 10 mg Oral Tablet Take 1 Tablet (10 mg total) by mouth Every evening    famotidine (PEPCID) 40 mg Oral Tablet Take 1 Tablet (40 mg total) by mouth Once a day    gabapentin (NEURONTIN) 100 mg Oral Capsule Take 1 Capsule (100 mg total) by mouth Three times a day    lidocaine-prilocaine (EMLA) 2.5-2.5 % Cream One time    magnesium Chloride (SLOW-MAG) 71.5 mg Oral Tablet, Delayed Release (E.C.) Take 1 Tablet (71.5 mg total) by mouth Twice daily    magnesium oxide (MAG-OX) 400 mg Oral Tablet Take 1 Tablet (400 mg total) by mouth Twice daily    metFORMIN (GLUCOPHAGE XR) 500 mg Oral Tablet Sustained Release 24 hr Take 1 Tablet (500 mg total) by mouth Twice daily    methocarbamoL (ROBAXIN) 750 mg Oral Tablet Take 1 Tablet (750 mg total) by mouth Four times a day    multivitamin with iron Oral Tablet Take 1 Tablet by mouth Once a day    ondansetron (ZOFRAN ODT) 4 mg Oral Tablet, Rapid Dissolve Take 1 Tablet (4 mg total) by mouth Every 8 hours as needed for Nausea/Vomiting Indications: prevent nausea and vomiting from cancer chemotherapy     sertraline (ZOLOFT) 50 mg Oral Tablet Take 1 Tablet (50 mg total) by mouth Once a day    traMADoL (ULTRAM) 50 mg Oral Tablet Take 1 Tablet (50 mg total) by mouth Every 4 hours as needed for Pain    vit C/E/Zn/coppr/lutein/zeaxan (PRESERVISION AREDS-2 ORAL) Take 1 Tablet by mouth Twice daily    XARELTO 20 mg Oral Tablet TAKE 1 TABLET BY MOUTH EVERY EVENING WITH DINNER     Allergies   Allergen Reactions    Adhesive Rash    Ceclor [Cefaclor]  Other Adverse Reaction (Add comment)     Pt states a Ceclor pill gave her blisters in her mouth.       PHYSICAL EXAM:  BP (!) 145/66 (Site: Right Arm, Patient Position: Sitting, Cuff Size: Adult)   Pulse 76   Temp 36.2 C (97.1 F) (Temporal)   Ht 1.626 m (5\' 4" )   Wt 87.6 kg (193 lb 3.2 oz)   SpO2 97%   BMI 33.16 kg/m        ECOG Status: (1) Restricted in physically strenuous activity, ambulatory and able to do work of light nature   Physical Exam  Vitals and  nursing note reviewed.   Constitutional:       Appearance: Normal appearance.   HENT:      Head: Normocephalic.      Nose: Nose normal.      Mouth/Throat:      Mouth: Mucous membranes are moist.      Pharynx: Oropharynx is clear.   Eyes:      General: No scleral icterus.     Extraocular Movements: Extraocular movements intact.   Cardiovascular:      Rate and Rhythm: Normal rate and regular rhythm.      Pulses: Normal pulses.      Heart sounds: Normal heart sounds.   Pulmonary:      Effort: Pulmonary effort is normal.      Breath sounds: Normal breath sounds.   Abdominal:      General: Bowel sounds are normal.      Palpations: Abdomen is soft.   Musculoskeletal:         General: Normal range of motion.      Cervical back: Normal range of motion and neck supple.   Skin:     General: Skin is warm and dry.   Neurological:      General: No focal deficit present.      Mental Status: She is alert and oriented to person, place, and time. Mental status is at baseline.   Psychiatric:         Mood and Affect: Mood normal.          Behavior: Behavior normal.         Thought Content: Thought content normal.         Judgment: Judgment normal.          DIAGNOSTIC DATA:  Results for orders placed during the hospital encounter of 01/23/23    PET RJ:JOAC (HEAD TO THIGH) WO IV CONTRAST    Narrative  Chinmayi Joynt    RADIOLOGIST: Karolee Stamps    PET CT SUBSEQUENT: BODY (HEAD TO THIGH) WO IV CONTRAST performed on 01/23/2023 1:38 PM    CLINICAL HISTORY: C50.919: Breast cancer (CMS HCC).  BREAST CANCER/CURRENTLY UNDER GOING TREATMENT FOR LEFT BREAST CANCER/HX OF ENDOMETRIAL AND LUNG CA EXCISIONAL BIOPSY LEFT AXILLA 08/07/2022.    Subsequent Treatment Strategy.    TECHNIQUE:  F-18 fluorodeoxyglucose intravenously with PET-CT from the skull base to the proximal thighs  Uptake Time: 60 minutes  Serum Glucose: 111 mg/dL at the time of injection  SUV Measurement Type: SUVmax  SUV Normalization Method: Body weight    RADIOPHARMACEUTICAL: 12.4 millicuries F-18 FDG    COMPARISON: PET/CT from 07/04/2022. CT abdomen and pelvis from 01/05/2023.    FINDINGS:  Reference Background Values:  Mediastinal Blood Pool: 2.47 SUV max  Volumetric Normal Liver: 3.39 SUV max    HEAD AND NECK: No suspicious focal hypermetabolic activity. No hypermetabolic cervical or supraclavicular lymph nodes. No hypermetabolic parotid gland lesion is seen. No suspicious hypermetabolic activity about the pharynx.    CHEST: The previously seen hypermetabolic left axillary lesion-lymph node on PET/CT from 07/04/2022 is no longer identified. There are no current hypermetabolic mediastinal, hilar, internal mammary or axillary lymph nodes identified. No hypermetabolic lung lesion. No pleural effusion.    ABDOMEN AND PELVIS: No suspicious focal hypermetabolic activity is identified. No ascites. No hydronephrosis. Genitourinary activity appears physiologic. Left adrenal gland nodularity is unchanged. Stable borderline enlargement of the spleen. No convincing focal hypermetabolic activity involving the  stomach to confirm gastritis or focal active inflammation.  The majority of the colon is again seen to be partially decompressed similar to CT from 2023-01-06 with some liquid stool again suggested within the colon which may be related to diarrheal state. No suspicious focal increased hypermetabolic activity within the colon is seen. Generalized metabolic activity throughout the colon as well as rectum is present and may be physiologic. Please correlate with clinical history for possible degree of generalized nonspecific enteritis-colitis with possible diarrhea. These findings are also suggested on the prior CT from Jan 06, 2023.    MUSCULOSKELETAL: Continued slight increase concavity of the L1 vertebral body inferior endplate is suggested on CT localizer images compatible with a compression fracture without bony retropulsion when compared to 01/06/23. Some relatively low level metabolic activity essentially paralleling the L1 vertebral body inferior endplate is present without discrete bone lesion seen on CT localizer images.    No osseous destructive lesion is seen. Postsurgical hardware about the proximal left femur again partially visualized. Discogenic and degenerative arthritic changes of the spine again seen at multiple levels. No retroperitoneal hematoma. Psoas musculature is grossly symmetric.    Impression  1. L1 VERTEBRAL BODY MILD COMPRESSION FRACTURE DEFORMITY WITH CONCAVITY OF THE INFERIOR ENDPLATE WHICH MAY BE SLIGHTLY MORE PRONOUNCED WHEN COMPARED TO 01/06/2023. SOME METABOLIC ACTIVITY WITHIN THE VERTEBRAL BODY ALONG THE INFERIOR ENDPLATE IS PRESENT AND COULD BE RELATED TO RECENT FRACTURE. PLEASE CONSIDER MRI PREFERABLY WITHOUT AND WITH IV CONTRAST FOR FURTHER AND MORE DEFINITIVE EVALUATION OF THE LUMBAR SPINE PARTICULARLY THE L1 LEVEL AND HELP COMPLETELY EXCLUDE THE POSSIBILITY OF A PATHOLOGIC FRACTURE.    2. NO CURRENT SUSPICIOUS FOCAL HYPERMETABOLIC ACTIVITY INVOLVING THE SOFT TISSUES OF THE  NECK, CHEST, ABDOMEN OR PELVIS TO SUGGEST MALIGNANCY-METASTASIS WHEN COMPARED TO 07/04/2022.    3. PREVIOUSLY SEEN HYPERMETABOLIC LEFT AXILLARY LESION-LYMPH NODE ON 07/04/2022 IS NO LONGER PRESENT.    4. PERSISTENT IMAGING FINDINGS SUGGESTING A POSSIBLE DEGREE OF NONSPECIFIC ENTERITIS -COLITIS WHEN COMPARED TO CT FROM 01-06-2023. PLEASE CORRELATE WITH CLINICAL FINDINGS AND PATIENT HISTORY.        Radiologist location ID: [id number]         LABS:   CBC  Diff   Lab Results   Component Value Date/Time    WBC 3.7 (L) 03/14/2023 09:11 AM    HGB 10.9 03/14/2023 09:11 AM    HCT 32.8 03/14/2023 09:11 AM    PLTCNT 154 03/14/2023 09:11 AM    RBC 3.83 03/14/2023 09:11 AM    MCV 85.8 03/14/2023 09:11 AM    MCHC 33.1 03/14/2023 09:11 AM    MCH 28.4 03/14/2023 09:11 AM    RDW 15.6 03/14/2023 09:11 AM    MPV 9.0 03/14/2023 09:11 AM    Lab Results   Component Value Date/Time    PMNS 42 (L) 03/14/2023 09:11 AM    LYMPHOCYTES 46 (H) 03/14/2023 09:11 AM    EOSINOPHIL 2 03/14/2023 09:11 AM    MONOCYTES 9 03/14/2023 09:11 AM    BASOPHILS 1 03/14/2023 09:11 AM    BASOPHILS 0.00 03/14/2023 09:11 AM    PMNABS 1.60 (L) 03/14/2023 09:11 AM    LYMPHSABS 1.70 03/14/2023 09:11 AM    EOSABS 0.10 03/14/2023 09:11 AM    MONOSABS 0.30 03/14/2023 09:11 AM    BASABS 0.04 05/30/2022 05:40 AM            Comprehensive Metabolic Profile    Lab Results   Component Value Date    SODIUM 139 03/14/2023    POTASSIUM 4.5 03/14/2023    CHLORIDE 107 03/14/2023  CO2 31 03/14/2023    ANIONGAP 1 (L) 03/14/2023    BUN 20 03/14/2023    CREATININE 0.94 03/14/2023    ALBUMIN 3.4 (L) 03/14/2023    CALCIUM 10.4 (H) 03/14/2023    GLUCOSENF 136 (H) 03/14/2023    ALKPHOS 135 (H) 03/14/2023    ALT 50 03/14/2023    AST 48 (H) 03/14/2023    TOTBILIRUBIN 0.4 03/14/2023    TOTALPROTEIN 6.3 (L) 03/14/2023         ESTIMATED GFR   Date Value Ref Range Status   03/14/2023 61 >59 mL/min/1.19m^2 Final     ASSESSMENT:      ICD-10-CM    1. Breast cancer (CMS HCC)  C50.919       2.  History of lung cancer  Z85.118       3. History of endometrial cancer  Z85.42       4. Traumatic compression fracture of lumbar vertebra with routine healing  S32.000D                PLAN:   1. Breast cancer:  Status post resection of an involved lymph node that was 2.3 cm on PET/CT.  No primary lesion was seen on imaging studies.  T0 N1 M0, stage IIA.  ER negative, HER2 Neu positive.  After resection of the node, she was rendered cancer-free radiographically.  She is on treatment with adjuvant paclitaxel and trastuzumab.  We had previously discussed the options of either no additional surgery and taking our chances versus radiation of the breast versus mastectomy.  Radiation to the axillary area will likely be required due to the lymph node that was known to be involved.  Her cardiac function is acceptable, and we will make arrangements to resume trastuzumab adjuvant therapy.  Oncology for additional discussion.  2. History of lung cancer:  Continue surveillance.    3. History of gynecologic cancer: Continue surveillance as well.  4. Compression fracture: The patient had a recent compression fracture after suffering a fall.  I do not have any reason to suspect that it was pathologic in nature.  We discussed use of Prolia in order to prevent future skeletal related events.  She received her 1st dose of Prolia on 02/12/23 and is scheduled for her next dose 08/20/23.      Cory Szymanowski was given the chance to ask questions, and these were answered to their satisfaction. The patient is welcome to call with any questions or concerns in the meantime.     On the day of the encounter, a total of 34 minutes was spent on this patient encounter including review of historical information, examination, documentation and post-visit activities.   Return in about 3 weeks (around 04/04/2023).     Benjaman Pott, APRN,FNP-BC ,03/14/2023 ,09:52     The patient's insurance company bears full legal and financial responsibility resulting  from any deviations that they cause to my recommended treatment plan.   CC:  Charm Rings, MD  [address]  [place] [address]    Charm Rings, MD  [address]  [place],  [place] [address]    This note was partially generated using MModal Fluency Direct system, and there may be some incorrect words, spellings, and punctuation that were not noted in checking the note before saving.

## 2023-03-15 ENCOUNTER — Encounter (INDEPENDENT_AMBULATORY_CARE_PROVIDER_SITE_OTHER): Payer: Self-pay | Admitting: HEMATOLOGY-ONCOLOGY

## 2023-04-01 ENCOUNTER — Other Ambulatory Visit (INDEPENDENT_AMBULATORY_CARE_PROVIDER_SITE_OTHER): Payer: Self-pay | Admitting: HEMATOLOGY-ONCOLOGY

## 2023-04-01 ENCOUNTER — Encounter (INDEPENDENT_AMBULATORY_CARE_PROVIDER_SITE_OTHER): Payer: Self-pay | Admitting: HEMATOLOGY-ONCOLOGY

## 2023-04-03 ENCOUNTER — Encounter (INDEPENDENT_AMBULATORY_CARE_PROVIDER_SITE_OTHER): Payer: Self-pay | Admitting: HEMATOLOGY-ONCOLOGY

## 2023-04-04 ENCOUNTER — Other Ambulatory Visit: Payer: Self-pay

## 2023-04-04 ENCOUNTER — Encounter (INDEPENDENT_AMBULATORY_CARE_PROVIDER_SITE_OTHER): Payer: Self-pay | Admitting: HEMATOLOGY-ONCOLOGY

## 2023-04-04 ENCOUNTER — Encounter (INDEPENDENT_AMBULATORY_CARE_PROVIDER_SITE_OTHER): Payer: Self-pay | Admitting: NURSE PRACTITIONER

## 2023-04-04 ENCOUNTER — Ambulatory Visit (INDEPENDENT_AMBULATORY_CARE_PROVIDER_SITE_OTHER)
Admission: RE | Admit: 2023-04-04 | Discharge: 2023-04-04 | Disposition: A | Discharge status: Home / Self Care | Payer: Medicare Other | Source: Ambulatory Visit | Attending: NURSE PRACTITIONER

## 2023-04-04 ENCOUNTER — Ambulatory Visit (HOSPITAL_BASED_OUTPATIENT_CLINIC_OR_DEPARTMENT_OTHER): Payer: Medicare Other | Admitting: NURSE PRACTITIONER

## 2023-04-04 ENCOUNTER — Ambulatory Visit
Admission: RE | Admit: 2023-04-04 | Discharge: 2023-04-04 | Disposition: A | Discharge status: Home / Self Care | Payer: Medicare Other | Source: Ambulatory Visit | Attending: HEMATOLOGY-ONCOLOGY | Admitting: HEMATOLOGY-ONCOLOGY

## 2023-04-04 VITALS — BP 101/67 | HR 83 | Temp 96.9°F | Ht 64.0 in | Wt 183.0 lb

## 2023-04-04 VITALS — BP 137/74 | HR 64 | Temp 98.0°F | Resp 20

## 2023-04-04 DIAGNOSIS — Z5112 Encounter for antineoplastic immunotherapy: Secondary | ICD-10-CM | POA: Insufficient documentation

## 2023-04-04 DIAGNOSIS — I82412 Acute embolism and thrombosis of left femoral vein: Secondary | ICD-10-CM

## 2023-04-04 DIAGNOSIS — Z5181 Encounter for therapeutic drug level monitoring: Secondary | ICD-10-CM

## 2023-04-04 DIAGNOSIS — Z79899 Other long term (current) drug therapy: Secondary | ICD-10-CM

## 2023-04-04 DIAGNOSIS — C50919 Malignant neoplasm of unspecified site of unspecified female breast: Secondary | ICD-10-CM

## 2023-04-04 DIAGNOSIS — B029 Zoster without complications: Secondary | ICD-10-CM | POA: Insufficient documentation

## 2023-04-04 DIAGNOSIS — Z8542 Personal history of malignant neoplasm of other parts of uterus: Secondary | ICD-10-CM | POA: Insufficient documentation

## 2023-04-04 DIAGNOSIS — Z85118 Personal history of other malignant neoplasm of bronchus and lung: Secondary | ICD-10-CM | POA: Insufficient documentation

## 2023-04-04 DIAGNOSIS — Z86718 Personal history of other venous thrombosis and embolism: Secondary | ICD-10-CM | POA: Insufficient documentation

## 2023-04-04 DIAGNOSIS — Z79620 Long term (current) use of immunosuppressive biologic: Secondary | ICD-10-CM | POA: Insufficient documentation

## 2023-04-04 LAB — CBC WITH DIFF
BASOPHIL #: 0 10*3/uL (ref 0.00–0.10)
BASOPHIL %: 1 % (ref 0–1)
EOSINOPHIL #: 0.1 10*3/uL (ref 0.00–0.50)
EOSINOPHIL %: 1 % (ref 1–7)
HCT: 36 % (ref 31.2–41.9)
HGB: 11.8 g/dL (ref 10.9–14.3)
LYMPHOCYTE #: 1.7 10*3/uL (ref 1.00–3.00)
LYMPHOCYTE %: 42 % (ref 16–44)
MCH: 27.8 pg (ref 24.7–32.8)
MCHC: 32.7 g/dL (ref 32.3–35.6)
MCV: 85.2 fL (ref 75.5–95.3)
MONOCYTE #: 0.4 10*3/uL (ref 0.30–1.00)
MONOCYTE %: 9 % (ref 5–13)
MPV: 9.2 fL (ref 7.9–10.8)
NEUTROPHIL #: 1.8 10*3/uL — ABNORMAL LOW (ref 1.85–7.80)
NEUTROPHIL %: 46 % (ref 43–77)
PLATELETS: 128 10*3/uL — ABNORMAL LOW (ref 140–440)
RBC: 4.23 10*6/uL (ref 3.63–4.92)
RDW: 15.2 % (ref 12.3–17.7)
WBC: 3.9 10*3/uL (ref 3.8–11.8)

## 2023-04-04 LAB — COMPREHENSIVE METABOLIC PANEL, NON-FASTING
ALBUMIN/GLOBULIN RATIO: 1.2 (ref 0.8–1.4)
ALBUMIN: 3.6 g/dL (ref 3.5–5.7)
ALKALINE PHOSPHATASE: 115 U/L — ABNORMAL HIGH (ref 34–104)
ALT (SGPT): 29 U/L (ref 7–52)
ANION GAP: 5 mmol/L (ref 4–13)
AST (SGOT): 36 U/L (ref 13–39)
BILIRUBIN TOTAL: 0.5 mg/dL (ref 0.3–1.0)
BUN/CREA RATIO: 16 (ref 6–22)
BUN: 15 mg/dL (ref 7–25)
CALCIUM, CORRECTED: 10.4 mg/dL (ref 8.9–10.8)
CALCIUM: 10.1 mg/dL (ref 8.6–10.3)
CHLORIDE: 105 mmol/L (ref 98–107)
CO2 TOTAL: 28 mmol/L (ref 21–31)
CREATININE: 0.96 mg/dL (ref 0.60–1.30)
ESTIMATED GFR: 60 mL/min/{1.73_m2} (ref >59)
GLOBULIN: 3 (ref 2.0–3.5)
GLUCOSE: 169 mg/dL — ABNORMAL HIGH (ref 74–109)
OSMOLALITY, CALCULATED: 281 mosm/kg (ref 270–290)
POTASSIUM: 4.2 mmol/L (ref 3.5–5.1)
PROTEIN TOTAL: 6.6 g/dL (ref 6.4–8.9)
SODIUM: 138 mmol/L (ref 136–145)

## 2023-04-04 LAB — MAGNESIUM: MAGNESIUM: 1.6 mg/dL — ABNORMAL LOW (ref 1.9–2.7)

## 2023-04-04 MED ORDER — SODIUM CHLORIDE 0.9% FLUSH BAG - 250 ML
INTRAVENOUS | Status: DC | PRN
Start: 2023-04-04 — End: 2023-04-05

## 2023-04-04 MED ORDER — MAGNESIUM SULFATE 1 GRAM/100 ML IN DEXTROSE 5 % INTRAVENOUS PIGGYBACK
INJECTION | INTRAVENOUS | Status: AC
Start: 2023-04-04 — End: 2023-04-04
  Filled 2023-04-04: qty 200

## 2023-04-04 MED ORDER — MEPERIDINE (PF) 25 MG/ML INJECTION SOLUTION
12.5000 mg | Freq: Once | INTRAMUSCULAR | Status: DC | PRN
Start: 2023-04-04 — End: 2023-04-05

## 2023-04-04 MED ORDER — ALBUTEROL SULFATE 2.5 MG/3 ML (0.083 %) SOLUTION FOR NEBULIZATION
2.5000 mg | INHALATION_SOLUTION | Freq: Once | RESPIRATORY_TRACT | Status: DC | PRN
Start: 2023-04-04 — End: 2023-04-05

## 2023-04-04 MED ORDER — TRASTUZUMAB-ANNS 420 MG INTRAVENOUS SOLUTION
6.0000 mg/kg | Freq: Once | INTRAVENOUS | Status: AC
Start: 2023-04-04 — End: 2023-04-04
  Administered 2023-04-04: 0 mg via INTRAVENOUS
  Administered 2023-04-04: 525 mg via INTRAVENOUS
  Filled 2023-04-04: qty 25

## 2023-04-04 MED ORDER — EPINEPHRINE 1 MG/ML (1 ML) INJECTION SOLUTION
0.3000 mg | Freq: Once | INTRAMUSCULAR | Status: DC | PRN
Start: 2023-04-04 — End: 2023-04-05

## 2023-04-04 MED ORDER — DIPHENHYDRAMINE 50 MG/ML INJECTION SOLUTION
50.0000 mg | Freq: Once | INTRAMUSCULAR | Status: DC | PRN
Start: 2023-04-04 — End: 2023-04-05

## 2023-04-04 MED ORDER — ALBUTEROL SULFATE HFA 90 MCG/ACTUATION AEROSOL INHALER - RN
2.0000 | Freq: Once | RESPIRATORY_TRACT | Status: DC | PRN
Start: 2023-04-04 — End: 2023-04-05

## 2023-04-04 MED ORDER — HYDROCORTISONE SOD SUCCINATE 100 MG/2 ML VIAL WRAPPER
100.0000 mg | Freq: Once | INTRAMUSCULAR | Status: DC | PRN
Start: 2023-04-04 — End: 2023-04-05

## 2023-04-04 MED ORDER — DEXTROSE 5% IN WATER (D5W) FLUSH BAG - 250 ML
INTRAVENOUS | Status: DC | PRN
Start: 2023-04-04 — End: 2023-04-05

## 2023-04-04 MED ORDER — MAGNESIUM SULFATE 1 GRAM/100 ML IN DEXTROSE 5 % INTRAVENOUS PIGGYBACK
1.0000 g | INJECTION | INTRAVENOUS | Status: AC
Start: 2023-04-04 — End: 2023-04-04
  Administered 2023-04-04: 1 g via INTRAVENOUS
  Administered 2023-04-04: 0 g via INTRAVENOUS
  Administered 2023-04-04: 1 g via INTRAVENOUS
  Administered 2023-04-04: 0 g via INTRAVENOUS

## 2023-04-04 MED ORDER — DIPHENHYDRAMINE 50 MG/ML INJECTION SOLUTION
25.0000 mg | Freq: Once | INTRAMUSCULAR | Status: DC | PRN
Start: 2023-04-04 — End: 2023-04-05

## 2023-04-04 MED ORDER — FAMOTIDINE (PF) 20 MG/2 ML INTRAVENOUS SOLUTION
20.0000 mg | Freq: Once | INTRAVENOUS | Status: DC | PRN
Start: 2023-04-04 — End: 2023-04-05

## 2023-04-04 NOTE — Nurses Notes (Signed)
1003: Patient, daughter and husband to room 220. Has already seen provider and had labs drawn.Evans Lance, RN  1020: Right port accessed per protocol.Evans Lance, RN  [phone]: 525 mg Kanjinti given IV per orders. Evans Lance, RN  [phone] : A total of 2 gms macnesium given IV-each over 30 minutes per pharmacy. Evans Lance, RN  1255: Infusions complete.  Port de-accessed per policy. Evans Lance, RN  [phone]: Pt left via w/c with family. No complaints voiced other than back pain. Evans Lance, RN

## 2023-04-04 NOTE — Cancer Center Note (Signed)
Department of Hematology/Oncology  Progress Note   Name: Kelsey Preston  IRJ:[id number]  Date of Birth: 02/24/1941  Encounter Date: 04/04/2023    REFERRING PROVIDER:  Charm Rings, MD  [address]  [place],  [place] [address]    REASON FOR OFFICE VISIT:  Breast Cancer     HISTORY OF PRESENT ILLNESS:  Kelsey Preston is a 83 y.o. female who presents today for follow up of breast cancer     The patient has a history of non-small-cell lung cancer, status post lobectomy in 2014.      She also has a history of endometrial cancer, and is status post surgical resection of that cancer as well.    More recently, the patient was found to have a left axillary lesion measuring 2.3 cm on PET-CT scan.  For some reason, I can not find an actual size measurement on the pathology report.  Multiple imaging studies of the breast were performed, but no primary lesion was found.  The malignant lesion was definitively classified as a lymph node.    Receptor staining showed that it was ER negative and HER2 Neu positive.  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    The patient was hospitalized from 7/22 to 10/09/22 while on treatment with paclitaxel and trastuzumab due to neutropenia and hypotension. She has also had electrolyte issues, with her magnesium level being somewhat low.  Her Echo completed on 05/31/22 showed an Ejection Fraction of 60-65%. Her follow up Echo on 09/24/22 showed EF of 54% and Echo on 09/24/22 showed EF of 50-55%. Dr. [name] was notified of these results and he recommended holding Kinjinti and repeating Echo prior to her next dose of Kinjinti. She had a recent follow up echocardiogram completed on 01/23/23 which showed complete recovery.   In December 2024 she developed shingles on her left chest, around her left side and back. She completed Valacyclovir and was placed on Gabapentin 100 mg TID to help with the pain. She continues to have some pain, itching and burning in the affected area but otherwise recovering  well.    ROS:   Pertinent review of systems as discussed in HPI    HISTORY:  Past Medical History:   Diagnosis Date    Diabetes mellitus, type 2 (CMS HCC)     Embolism (CMS HCC)     left leg    Endometrial cancer (CMS HCC)     HTN (hypertension)     Hx of breast cancer     Lung cancer (CMS HCC)     Macular degeneration (senile) of retina      Past Surgical History:   Procedure Laterality Date    HIP SURGERY Left     HX CHOLECYSTECTOMY      HX COLONOSCOPY      HX HYSTERECTOMY      HX LOBECTOMY Left     LUNG CANCER SURGERY      PORTACATH PLACEMENT       Social History     Socioeconomic History    Marital status: Married     Spouse name: Not on file    Number of children: Not on file    Years of education: Not on file    Highest education level: Not on file   Occupational History    Not on file   Tobacco Use    Smoking status: Never    Smokeless tobacco: Never   Vaping Use    Vaping status: Never Used  Substance and Sexual Activity    Alcohol use: Never    Drug use: Never    Sexual activity: Not Currently   Other Topics Concern    Not on file   Social History Narrative    Not on file     Social Determinants of Health     Financial Resource Strain: Medium Risk (01/29/2023)    Received from [facility]    Overall Financial Resource Strain (CARDIA)     Difficulty of Paying Living Expenses: Somewhat hard   Transportation Needs: No Transportation Needs (01/29/2023)    Received from [facility] - Transportation     Lack of Transportation (Medical): No     Lack of Transportation (Non-Medical): No   Social Connections: Moderately Integrated (01/29/2023)    Received from [facility]    Social Connection and Isolation Panel [NHANES]     Frequency of Communication with Friends and Family: More than three times a week     Frequency of Social Gatherings with Friends and Family: More than three times a week     Attends Religious Services: More than 4 times per year     Active Member of [employer] or Organizations:  No     Attends Banker Meetings: Never     Marital Status: Married   Catering manager Violence: Not At Risk (01/29/2023)    Received from [employer], Afraid, Rape, and Kick questionnaire     Fear of Current or Ex-Partner: No     Emotionally Abused: No     Physically Abused: No     Sexually Abused: No   Housing Stability: Low Risk  (01/29/2023)    Received from [facility] Stability Vital Sign     Unable to Pay for Housing in the Last Year: No     Number of Times Moved in the Last Year: 1     Homeless in the Last Year: No     Family Medical History:       Problem Relation (Age of Onset)    Cancer Brother            Current Outpatient Medications   Medication Sig    atorvastatin (LIPITOR) 40 mg Oral Tablet Take 1 Tablet (40 mg total) by mouth Every evening for 30 days    carvediloL (COREG) 12.5 mg Oral Tablet Take 1 Tablet (12.5 mg total) by mouth Twice daily    esomeprazole magnesium (NEXIUM) 40 mg Oral Capsule, Delayed Release(E.C.) Take 1 Capsule (40 mg total) by mouth Every morning before breakfast    ezetimibe (ZETIA) 10 mg Oral Tablet Take 1 Tablet (10 mg total) by mouth Every evening    famotidine (PEPCID) 40 mg Oral Tablet Take 1 Tablet (40 mg total) by mouth Once a day    gabapentin (NEURONTIN) 100 mg Oral Capsule Take 1 Capsule (100 mg total) by mouth Three times a day    gabapentin (NEURONTIN) 100 mg Oral Capsule 100 mg 3 TIMES DAILY (route: oral)    lidocaine-prilocaine (EMLA) 2.5-2.5 % Cream One time    magnesium oxide (MAG-OX) 400 mg Oral Tablet Take 1 Tablet (400 mg total) by mouth Twice daily    metFORMIN (GLUCOPHAGE XR) 500 mg Oral Tablet Sustained Release 24 hr Take 1 Tablet (500 mg total) by mouth Twice daily    methocarbamoL (ROBAXIN) 750 mg Oral Tablet Take 1 Tablet (750 mg total) by mouth Four times a day  multivitamin with iron Oral Tablet Take 1 Tablet by mouth Once a day    ondansetron (ZOFRAN ODT) 4 mg Oral Tablet, Rapid Dissolve Take 1 Tablet  (4 mg total) by mouth Every 8 hours as needed for Nausea/Vomiting Indications: prevent nausea and vomiting from cancer chemotherapy    sertraline (ZOLOFT) 50 mg Oral Tablet Take 1 Tablet (50 mg total) by mouth Once a day    traMADoL (ULTRAM) 50 mg Oral Tablet Take 1 Tablet (50 mg total) by mouth Every 4 hours as needed for Pain    vit C/E/Zn/coppr/lutein/zeaxan (PRESERVISION AREDS-2 ORAL) Take 1 Tablet by mouth Twice daily    XARELTO 20 mg Oral Tablet TAKE 1 TABLET BY MOUTH EVERY EVENING WITH DINNER     Allergies   Allergen Reactions    Adhesive Rash    Ceclor [Cefaclor]  Other Adverse Reaction (Add comment)     Pt states a Ceclor pill gave her blisters in her mouth.       PHYSICAL EXAM:  BP 101/67 (Site: Left Arm, Patient Position: Sitting, Cuff Size: Adult)   Pulse 83   Temp 36.1 C (96.9 F) (Temporal)   Ht 1.626 m (5\' 4" )   Wt 83 kg (183 lb)   SpO2 95%   BMI 31.41 kg/m        ECOG Status: (1) Restricted in physically strenuous activity, ambulatory and able to do work of light nature   Physical Exam  Vitals and nursing note reviewed.   Constitutional:       Appearance: Normal appearance.   HENT:      Head: Normocephalic.      Nose: Nose normal.      Mouth/Throat:      Mouth: Mucous membranes are moist.      Pharynx: Oropharynx is clear.   Eyes:      General: No scleral icterus.     Extraocular Movements: Extraocular movements intact.   Cardiovascular:      Rate and Rhythm: Normal rate and regular rhythm.      Pulses: Normal pulses.      Heart sounds: Normal heart sounds.   Pulmonary:      Effort: Pulmonary effort is normal.      Breath sounds: Normal breath sounds.   Abdominal:      General: Bowel sounds are normal.      Palpations: Abdomen is soft.   Musculoskeletal:         General: Normal range of motion.      Cervical back: Normal range of motion and neck supple.   Skin:     General: Skin is warm and dry.   Neurological:      General: No focal deficit present.      Mental Status: She is alert and  oriented to person, place, and time. Mental status is at baseline.   Psychiatric:         Mood and Affect: Mood normal.         Behavior: Behavior normal.         Thought Content: Thought content normal.         Judgment: Judgment normal.          DIAGNOSTIC DATA:  Results for orders placed during the hospital encounter of 01/23/23    PET ZO:XWRU (HEAD TO THIGH) WO IV CONTRAST    Narrative  Malary Dawes    RADIOLOGIST: Karolee Stamps    PET CT SUBSEQUENT: BODY (HEAD TO THIGH) WO IV CONTRAST performed on  01/23/2023 1:38 PM    CLINICAL HISTORY: C50.919: Breast cancer (CMS HCC).  BREAST CANCER/CURRENTLY UNDER GOING TREATMENT FOR LEFT BREAST CANCER/HX OF ENDOMETRIAL AND LUNG CA EXCISIONAL BIOPSY LEFT AXILLA 08/07/2022.    Subsequent Treatment Strategy.    TECHNIQUE:  F-18 fluorodeoxyglucose intravenously with PET-CT from the skull base to the proximal thighs  Uptake Time: 60 minutes  Serum Glucose: 111 mg/dL at the time of injection  SUV Measurement Type: SUVmax  SUV Normalization Method: Body weight    RADIOPHARMACEUTICAL: 12.4 millicuries F-18 FDG    COMPARISON: PET/CT from 07/04/2022. CT abdomen and pelvis from 01-09-23.    FINDINGS:  Reference Background Values:  Mediastinal Blood Pool: 2.47 SUV max  Volumetric Normal Liver: 3.39 SUV max    HEAD AND NECK: No suspicious focal hypermetabolic activity. No hypermetabolic cervical or supraclavicular lymph nodes. No hypermetabolic parotid gland lesion is seen. No suspicious hypermetabolic activity about the pharynx.    CHEST: The previously seen hypermetabolic left axillary lesion-lymph node on PET/CT from 07/04/2022 is no longer identified. There are no current hypermetabolic mediastinal, hilar, internal mammary or axillary lymph nodes identified. No hypermetabolic lung lesion. No pleural effusion.    ABDOMEN AND PELVIS: No suspicious focal hypermetabolic activity is identified. No ascites. No hydronephrosis. Genitourinary activity appears physiologic. Left adrenal gland  nodularity is unchanged. Stable borderline enlargement of the spleen. No convincing focal hypermetabolic activity involving the stomach to confirm gastritis or focal active inflammation.    The majority of the colon is again seen to be partially decompressed similar to CT from 2023/01/09 with some liquid stool again suggested within the colon which may be related to diarrheal state. No suspicious focal increased hypermetabolic activity within the colon is seen. Generalized metabolic activity throughout the colon as well as rectum is present and may be physiologic. Please correlate with clinical history for possible degree of generalized nonspecific enteritis-colitis with possible diarrhea. These findings are also suggested on the prior CT from 2023-01-09.    MUSCULOSKELETAL: Continued slight increase concavity of the L1 vertebral body inferior endplate is suggested on CT localizer images compatible with a compression fracture without bony retropulsion when compared to 01/09/23. Some relatively low level metabolic activity essentially paralleling the L1 vertebral body inferior endplate is present without discrete bone lesion seen on CT localizer images.    No osseous destructive lesion is seen. Postsurgical hardware about the proximal left femur again partially visualized. Discogenic and degenerative arthritic changes of the spine again seen at multiple levels. No retroperitoneal hematoma. Psoas musculature is grossly symmetric.    Impression  1. L1 VERTEBRAL BODY MILD COMPRESSION FRACTURE DEFORMITY WITH CONCAVITY OF THE INFERIOR ENDPLATE WHICH MAY BE SLIGHTLY MORE PRONOUNCED WHEN COMPARED TO January 09, 2023. SOME METABOLIC ACTIVITY WITHIN THE VERTEBRAL BODY ALONG THE INFERIOR ENDPLATE IS PRESENT AND COULD BE RELATED TO RECENT FRACTURE. PLEASE CONSIDER MRI PREFERABLY WITHOUT AND WITH IV CONTRAST FOR FURTHER AND MORE DEFINITIVE EVALUATION OF THE LUMBAR SPINE PARTICULARLY THE L1 LEVEL AND HELP COMPLETELY EXCLUDE THE  POSSIBILITY OF A PATHOLOGIC FRACTURE.    2. NO CURRENT SUSPICIOUS FOCAL HYPERMETABOLIC ACTIVITY INVOLVING THE SOFT TISSUES OF THE NECK, CHEST, ABDOMEN OR PELVIS TO SUGGEST MALIGNANCY-METASTASIS WHEN COMPARED TO 07/04/2022.    3. PREVIOUSLY SEEN HYPERMETABOLIC LEFT AXILLARY LESION-LYMPH NODE ON 07/04/2022 IS NO LONGER PRESENT.    4. PERSISTENT IMAGING FINDINGS SUGGESTING A POSSIBLE DEGREE OF NONSPECIFIC ENTERITIS -COLITIS WHEN COMPARED TO CT FROM 2023/01/09. PLEASE CORRELATE WITH CLINICAL FINDINGS AND PATIENT HISTORY.        Radiologist  location ID: [id number]         LABS:   CBC  Diff   Lab Results   Component Value Date/Time    WBC 3.9 04/04/2023 09:18 AM    HGB 11.8 04/04/2023 09:18 AM    HCT 36.0 04/04/2023 09:18 AM    PLTCNT 128 (L) 04/04/2023 09:18 AM    RBC 4.23 04/04/2023 09:18 AM    MCV 85.2 04/04/2023 09:18 AM    MCHC 32.7 04/04/2023 09:18 AM    MCH 27.8 04/04/2023 09:18 AM    RDW 15.2 04/04/2023 09:18 AM    MPV 9.2 04/04/2023 09:18 AM    Lab Results   Component Value Date/Time    PMNS 46 04/04/2023 09:18 AM    LYMPHOCYTES 42 04/04/2023 09:18 AM    EOSINOPHIL 1 04/04/2023 09:18 AM    MONOCYTES 9 04/04/2023 09:18 AM    BASOPHILS 1 04/04/2023 09:18 AM    BASOPHILS 0.00 04/04/2023 09:18 AM    PMNABS 1.80 (L) 04/04/2023 09:18 AM    LYMPHSABS 1.70 04/04/2023 09:18 AM    EOSABS 0.10 04/04/2023 09:18 AM    MONOSABS 0.40 04/04/2023 09:18 AM    BASABS 0.04 05/30/2022 05:40 AM            Comprehensive Metabolic Profile    Lab Results   Component Value Date    SODIUM 139 03/14/2023    POTASSIUM 4.5 03/14/2023    CHLORIDE 107 03/14/2023    CO2 31 03/14/2023    ANIONGAP 1 (L) 03/14/2023    BUN 20 03/14/2023    CREATININE 0.94 03/14/2023    ALBUMIN 3.4 (L) 03/14/2023    CALCIUM 10.4 (H) 03/14/2023    GLUCOSENF 136 (H) 03/14/2023    ALKPHOS 135 (H) 03/14/2023    ALT 50 03/14/2023    AST 48 (H) 03/14/2023    TOTBILIRUBIN 0.4 03/14/2023    TOTALPROTEIN 6.3 (L) 03/14/2023         ESTIMATED GFR   Date Value Ref Range Status    03/14/2023 61 >59 mL/min/1.68m^2 Final     ASSESSMENT:      ICD-10-CM    1. Breast cancer (CMS HCC)  C50.919       2. History of lung cancer  Z85.118       3. History of endometrial cancer  Z85.42       4. Encounter for monitoring cardiotoxic drug therapy  Z51.81 TRANSTHORACIC ECHOCARDIOGRAM - ADULT    Z79.899                  PLAN:   1. Breast cancer:  Status post resection of an involved lymph node that was 2.3 cm on PET/CT.  No primary lesion was seen on imaging studies.  T0 N1 M0, stage IIA.  ER negative, HER2 Neu positive.  After resection of the node, she was rendered cancer-free radiographically.  She is on treatment with adjuvant paclitaxel and trastuzumab.  We had previously discussed the options of either no additional surgery and taking our chances versus radiation of the breast versus mastectomy.  Radiation to the axillary area will likely be required due to the lymph node that was known to be involved.  Her cardiac function is acceptable, and she will continue trastuzumab adjuvant therapy. Her next ECHO is scheduled on 04/25/23 @ 2:00 pm.   2. History of lung cancer:  Continue surveillance.    3. History of gynecologic cancer: Continue surveillance as well.  4. Compression fracture: The patient had a recent  compression fracture after suffering a fall.  I do not have any reason to suspect that it was pathologic in nature.  We discussed use of Prolia in order to prevent future skeletal related events.  She received her 1st dose of Prolia on 02/12/23 and is scheduled for her next dose 08/20/23.    5. Shingles: resolving, post Acyclovir and currently on Gabapentin.     Aimsley Walstad was given the chance to ask questions, and these were answered to their satisfaction. The patient is welcome to call with any questions or concerns in the meantime.     On the day of the encounter, a total of 46 minutes was spent on this patient encounter including review of historical information, examination, documentation and  post-visit activities.   Return in about 3 weeks (around 04/25/2023).     Benjaman Pott, APRN,FNP-BC ,04/04/2023 ,09:58     The patient's insurance company bears full legal and financial responsibility resulting from any deviations that they cause to my recommended treatment plan.   CC:  Charm Rings, MD  [address]  [place] [address]    Charm Rings, MD  [address]  [place],  [place] [address]    This note was partially generated using MModal Fluency Direct system, and there may be some incorrect words, spellings, and punctuation that were not noted in checking the note before saving.

## 2023-04-08 ENCOUNTER — Encounter (INDEPENDENT_AMBULATORY_CARE_PROVIDER_SITE_OTHER): Payer: Self-pay | Admitting: HEMATOLOGY-ONCOLOGY

## 2023-04-11 ENCOUNTER — Other Ambulatory Visit: Payer: Self-pay

## 2023-04-11 ENCOUNTER — Ambulatory Visit
Admission: RE | Admit: 2023-04-11 | Discharge: 2023-04-11 | Disposition: A | Discharge status: Home / Self Care | Payer: Medicare Other | Source: Ambulatory Visit | Attending: NURSE PRACTITIONER | Admitting: NURSE PRACTITIONER

## 2023-04-11 LAB — COMPREHENSIVE METABOLIC PANEL, NON-FASTING
ALBUMIN/GLOBULIN RATIO: 1.2 (ref 0.8–1.4)
ALBUMIN: 3.5 g/dL (ref 3.5–5.7)
ALKALINE PHOSPHATASE: 128 U/L — ABNORMAL HIGH (ref 34–104)
ALT (SGPT): 64 U/L — ABNORMAL HIGH (ref 7–52)
ANION GAP: 3 mmol/L — ABNORMAL LOW (ref 4–13)
AST (SGOT): 63 U/L — ABNORMAL HIGH (ref 13–39)
BILIRUBIN TOTAL: 0.5 mg/dL (ref 0.3–1.0)
BUN/CREA RATIO: 22 (ref 6–22)
BUN: 22 mg/dL (ref 7–25)
CALCIUM, CORRECTED: 10.2 mg/dL (ref 8.9–10.8)
CALCIUM: 9.8 mg/dL (ref 8.6–10.3)
CHLORIDE: 105 mmol/L (ref 98–107)
CO2 TOTAL: 29 mmol/L (ref 21–31)
CREATININE: 0.99 mg/dL (ref 0.60–1.30)
ESTIMATED GFR: 58 mL/min/{1.73_m2} — ABNORMAL LOW (ref >59)
GLOBULIN: 3 (ref 2.0–3.5)
GLUCOSE: 146 mg/dL — ABNORMAL HIGH (ref 74–109)
OSMOLALITY, CALCULATED: 280 mosm/kg (ref 270–290)
POTASSIUM: 4.7 mmol/L (ref 3.5–5.1)
PROTEIN TOTAL: 6.5 g/dL (ref 6.4–8.9)
SODIUM: 137 mmol/L (ref 136–145)

## 2023-04-11 LAB — CBC WITH DIFF
BASOPHIL #: 0 10*3/uL (ref 0.00–0.10)
BASOPHIL %: 1 % (ref 0–1)
EOSINOPHIL #: 0.1 10*3/uL (ref 0.00–0.50)
EOSINOPHIL %: 2 % (ref 1–7)
HCT: 34.6 % (ref 31.2–41.9)
HGB: 11.4 g/dL (ref 10.9–14.3)
LYMPHOCYTE #: 1.8 10*3/uL (ref 1.00–3.00)
LYMPHOCYTE %: 44 % (ref 16–44)
MCH: 27.9 pg (ref 24.7–32.8)
MCHC: 32.9 g/dL (ref 32.3–35.6)
MCV: 84.8 fL (ref 75.5–95.3)
MONOCYTE #: 0.4 10*3/uL (ref 0.30–1.00)
MONOCYTE %: 9 % (ref 5–13)
MPV: 9.2 fL (ref 7.9–10.8)
NEUTROPHIL #: 1.8 10*3/uL — ABNORMAL LOW (ref 1.85–7.80)
NEUTROPHIL %: 45 % (ref 43–77)
PLATELETS: 122 10*3/uL — ABNORMAL LOW (ref 140–440)
RBC: 4.09 10*6/uL (ref 3.63–4.92)
RDW: 15.3 % (ref 12.3–17.7)
WBC: 4.1 10*3/uL (ref 3.8–11.8)

## 2023-04-11 LAB — MAGNESIUM: MAGNESIUM: 1.8 mg/dL — ABNORMAL LOW (ref 1.9–2.7)

## 2023-04-11 NOTE — Nurses Notes (Signed)
1226- Arrived to unit in wheelchair accompanied by daughter. Here for labs, possible Mg replacement. Leeanne Deed, RN   [phone]- Wishes to have labs collected peripherally today. VSS. No complaints voiced. States she is feeling well. Lungs clear. Edema noted to right ankle. Leeanne Deed, RN   1340- Labs resulted. Mg 1.8, no replacement needed. Patient and daughter made aware. Leeanne Deed, RN   [phone]- Left unit in wheelchair with daughter. Leeanne Deed, RN

## 2023-04-18 ENCOUNTER — Ambulatory Visit (HOSPITAL_COMMUNITY): Payer: Medicare Other

## 2023-04-22 ENCOUNTER — Encounter (INDEPENDENT_AMBULATORY_CARE_PROVIDER_SITE_OTHER): Payer: Self-pay | Admitting: HEMATOLOGY-ONCOLOGY

## 2023-04-23 ENCOUNTER — Encounter (INDEPENDENT_AMBULATORY_CARE_PROVIDER_SITE_OTHER): Payer: Self-pay | Admitting: HEMATOLOGY-ONCOLOGY

## 2023-04-24 ENCOUNTER — Other Ambulatory Visit (INDEPENDENT_AMBULATORY_CARE_PROVIDER_SITE_OTHER): Payer: Self-pay | Admitting: HEMATOLOGY-ONCOLOGY

## 2023-04-25 ENCOUNTER — Other Ambulatory Visit: Payer: Self-pay

## 2023-04-25 ENCOUNTER — Ambulatory Visit (HOSPITAL_BASED_OUTPATIENT_CLINIC_OR_DEPARTMENT_OTHER)
Admission: RE | Admit: 2023-04-25 | Discharge: 2023-04-25 | Disposition: A | Discharge status: Home / Self Care | Payer: Medicare Other | Source: Ambulatory Visit | Attending: NURSE PRACTITIONER | Admitting: NURSE PRACTITIONER

## 2023-04-25 ENCOUNTER — Ambulatory Visit (HOSPITAL_BASED_OUTPATIENT_CLINIC_OR_DEPARTMENT_OTHER): Payer: Medicare Other | Admitting: NURSE PRACTITIONER

## 2023-04-25 ENCOUNTER — Ambulatory Visit (INDEPENDENT_AMBULATORY_CARE_PROVIDER_SITE_OTHER)
Admission: RE | Admit: 2023-04-25 | Discharge: 2023-04-25 | Disposition: A | Discharge status: Home / Self Care | Payer: Medicare Other | Source: Ambulatory Visit | Attending: NURSE PRACTITIONER | Admitting: NURSE PRACTITIONER

## 2023-04-25 ENCOUNTER — Ambulatory Visit
Admission: RE | Admit: 2023-04-25 | Discharge: 2023-04-25 | Disposition: A | Discharge status: Home / Self Care | Payer: Medicare Other | Source: Ambulatory Visit | Attending: HEMATOLOGY-ONCOLOGY | Admitting: HEMATOLOGY-ONCOLOGY

## 2023-04-25 ENCOUNTER — Encounter (INDEPENDENT_AMBULATORY_CARE_PROVIDER_SITE_OTHER): Payer: Self-pay | Admitting: NURSE PRACTITIONER

## 2023-04-25 VITALS — BP 136/86 | HR 76 | Temp 96.7°F | Ht 64.0 in | Wt 192.1 lb

## 2023-04-25 DIAGNOSIS — Z8542 Personal history of malignant neoplasm of other parts of uterus: Secondary | ICD-10-CM | POA: Insufficient documentation

## 2023-04-25 DIAGNOSIS — Z85118 Personal history of other malignant neoplasm of bronchus and lung: Secondary | ICD-10-CM | POA: Insufficient documentation

## 2023-04-25 DIAGNOSIS — Z08 Encounter for follow-up examination after completed treatment for malignant neoplasm: Secondary | ICD-10-CM | POA: Insufficient documentation

## 2023-04-25 DIAGNOSIS — B029 Zoster without complications: Secondary | ICD-10-CM | POA: Insufficient documentation

## 2023-04-25 DIAGNOSIS — Z79624 Long term (current) use of inhibitors of nucleotide synthesis: Secondary | ICD-10-CM | POA: Insufficient documentation

## 2023-04-25 DIAGNOSIS — I77810 Thoracic aortic ectasia: Secondary | ICD-10-CM

## 2023-04-25 DIAGNOSIS — C50919 Malignant neoplasm of unspecified site of unspecified female breast: Secondary | ICD-10-CM

## 2023-04-25 DIAGNOSIS — Z5181 Encounter for therapeutic drug level monitoring: Secondary | ICD-10-CM

## 2023-04-25 DIAGNOSIS — R531 Weakness: Secondary | ICD-10-CM | POA: Insufficient documentation

## 2023-04-25 DIAGNOSIS — Z853 Personal history of malignant neoplasm of breast: Secondary | ICD-10-CM | POA: Insufficient documentation

## 2023-04-25 DIAGNOSIS — Z79899 Other long term (current) drug therapy: Secondary | ICD-10-CM

## 2023-04-25 DIAGNOSIS — C50912 Malignant neoplasm of unspecified site of left female breast: Secondary | ICD-10-CM

## 2023-04-25 DIAGNOSIS — I517 Cardiomegaly: Secondary | ICD-10-CM

## 2023-04-25 DIAGNOSIS — Z993 Dependence on wheelchair: Secondary | ICD-10-CM | POA: Insufficient documentation

## 2023-04-25 DIAGNOSIS — I82412 Acute embolism and thrombosis of left femoral vein: Secondary | ICD-10-CM

## 2023-04-25 DIAGNOSIS — Z902 Acquired absence of lung [part of]: Secondary | ICD-10-CM | POA: Insufficient documentation

## 2023-04-25 DIAGNOSIS — Z171 Estrogen receptor negative status [ER-]: Secondary | ICD-10-CM

## 2023-04-25 LAB — CBC WITH DIFF
BASOPHIL #: 0 10*3/uL (ref 0.00–0.10)
BASOPHIL %: 1 % (ref 0–1)
EOSINOPHIL #: 0.1 10*3/uL (ref 0.00–0.50)
EOSINOPHIL %: 3 % (ref 1–7)
HCT: 34.2 % (ref 31.2–41.9)
HGB: 11.2 g/dL (ref 10.9–14.3)
LYMPHOCYTE #: 1.7 10*3/uL (ref 1.10–3.10)
LYMPHOCYTE %: 43 % (ref 16–46)
MCH: 27.6 pg (ref 24.7–32.8)
MCHC: 32.7 g/dL (ref 32.3–35.6)
MCV: 84.5 fL (ref 75.5–95.3)
MONOCYTE #: 0.4 10*3/uL (ref 0.20–0.90)
MONOCYTE %: 10 % (ref 4–11)
MPV: 9.2 fL (ref 7.9–10.8)
NEUTROPHIL #: 1.7 10*3/uL — ABNORMAL LOW (ref 1.90–8.20)
NEUTROPHIL %: 44 % (ref 43–77)
PLATELETS: 139 10*3/uL — ABNORMAL LOW (ref 140–440)
RBC: 4.05 10*6/uL (ref 3.63–4.92)
RDW: 15.9 % (ref 12.3–17.7)
WBC: 3.9 10*3/uL (ref 3.8–11.8)

## 2023-04-25 LAB — COMPREHENSIVE METABOLIC PANEL, NON-FASTING
ALBUMIN/GLOBULIN RATIO: 1.1 (ref 0.8–1.4)
ALBUMIN: 3.4 g/dL — ABNORMAL LOW (ref 3.5–5.7)
ALKALINE PHOSPHATASE: 94 U/L (ref 34–104)
ALT (SGPT): 27 U/L (ref 7–52)
ANION GAP: 5 mmol/L (ref 4–13)
AST (SGOT): 32 U/L (ref 13–39)
BILIRUBIN TOTAL: 0.5 mg/dL (ref 0.3–1.0)
BUN/CREA RATIO: 15 (ref 6–22)
BUN: 14 mg/dL (ref 7–25)
CALCIUM, CORRECTED: 10.1 mg/dL (ref 8.9–10.8)
CALCIUM: 9.6 mg/dL (ref 8.6–10.3)
CHLORIDE: 107 mmol/L (ref 98–107)
CO2 TOTAL: 27 mmol/L (ref 21–31)
CREATININE: 0.93 mg/dL (ref 0.60–1.30)
ESTIMATED GFR: 62 mL/min/{1.73_m2} (ref >59)
GLOBULIN: 3 (ref 2.0–3.5)
GLUCOSE: 135 mg/dL — ABNORMAL HIGH (ref 74–109)
OSMOLALITY, CALCULATED: 280 mosm/kg (ref 270–290)
POTASSIUM: 4.2 mmol/L (ref 3.5–5.1)
PROTEIN TOTAL: 6.4 g/dL (ref 6.4–8.9)
SODIUM: 139 mmol/L (ref 136–145)

## 2023-04-25 LAB — MAGNESIUM: MAGNESIUM: 1.5 mg/dL — ABNORMAL LOW (ref 1.9–2.7)

## 2023-04-25 MED ORDER — HYDROCORTISONE SOD SUCCINATE 100 MG/2 ML VIAL WRAPPER
100.0000 mg | Freq: Once | INTRAMUSCULAR | Status: DC | PRN
Start: 2023-04-25 — End: 2023-04-26

## 2023-04-25 MED ORDER — DIPHENHYDRAMINE 50 MG/ML INJECTION SOLUTION
50.0000 mg | Freq: Once | INTRAMUSCULAR | Status: DC | PRN
Start: 2023-04-25 — End: 2023-04-26

## 2023-04-25 MED ORDER — MAGNESIUM SULFATE 1 GRAM/100 ML IN DEXTROSE 5 % INTRAVENOUS PIGGYBACK
1.0000 g | INJECTION | INTRAVENOUS | Status: AC
Start: 2023-04-25 — End: 2023-04-25
  Administered 2023-04-25 (×2): 0 g via INTRAVENOUS
  Administered 2023-04-25 (×2): 1 g via INTRAVENOUS
  Filled 2023-04-25 (×2): qty 100

## 2023-04-25 MED ORDER — ALBUTEROL SULFATE 2.5 MG/3 ML (0.083 %) SOLUTION FOR NEBULIZATION
2.5000 mg | INHALATION_SOLUTION | Freq: Once | RESPIRATORY_TRACT | Status: DC | PRN
Start: 2023-04-25 — End: 2023-04-26

## 2023-04-25 MED ORDER — ALBUTEROL SULFATE HFA 90 MCG/ACTUATION AEROSOL INHALER - RN
2.0000 | Freq: Once | RESPIRATORY_TRACT | Status: DC | PRN
Start: 2023-04-25 — End: 2023-04-26

## 2023-04-25 MED ORDER — EPINEPHRINE 1 MG/ML (1 ML) INJECTION SOLUTION
0.3000 mg | Freq: Once | INTRAMUSCULAR | Status: DC | PRN
Start: 2023-04-25 — End: 2023-04-26

## 2023-04-25 MED ORDER — DEXTROSE 5% IN WATER (D5W) FLUSH BAG - 250 ML
INTRAVENOUS | Status: DC | PRN
Start: 2023-04-25 — End: 2023-04-26

## 2023-04-25 MED ORDER — SODIUM CHLORIDE 0.9% FLUSH BAG - 250 ML
INTRAVENOUS | Status: DC | PRN
Start: 2023-04-25 — End: 2023-04-26

## 2023-04-25 MED ORDER — ONDANSETRON 4 MG DISINTEGRATING TABLET
4.0000 mg | ORAL_TABLET | Freq: Three times a day (TID) | ORAL | 3 refills | Status: DC | PRN
Start: 2023-04-25 — End: 2023-10-17

## 2023-04-25 MED ORDER — FAMOTIDINE (PF) 20 MG/2 ML INTRAVENOUS SOLUTION
20.0000 mg | Freq: Once | INTRAVENOUS | Status: DC | PRN
Start: 2023-04-25 — End: 2023-04-26

## 2023-04-25 MED ORDER — TRASTUZUMAB-ANNS 420 MG INTRAVENOUS SOLUTION
6.0000 mg/kg | Freq: Once | INTRAVENOUS | Status: AC
Start: 2023-04-25 — End: 2023-04-25
  Administered 2023-04-25: 0 mg via INTRAVENOUS
  Administered 2023-04-25: 525 mg via INTRAVENOUS
  Filled 2023-04-25: qty 25

## 2023-04-25 MED ORDER — MEPERIDINE (PF) 25 MG/ML INJECTION SOLUTION
12.5000 mg | Freq: Once | INTRAMUSCULAR | Status: DC | PRN
Start: 2023-04-25 — End: 2023-04-26

## 2023-04-25 MED ORDER — DIPHENHYDRAMINE 50 MG/ML INJECTION SOLUTION
25.0000 mg | Freq: Once | INTRAMUSCULAR | Status: DC | PRN
Start: 2023-04-25 — End: 2023-04-26

## 2023-04-25 NOTE — Nurses Notes (Signed)
1019: Patient, daughter and husband to room 226. Has already seen provider and had labs drawn.Evans Lance, RN  [phone]: Right port accessed per protocol.Evans Lance, RN  [phone]: A total of 2 gms macnesium given IV-each over 30 minutes per pharmacy.  For mag level 1.5. Evans Lance, RN  [phone]: 525 mg Kanjinti given IV per orders. Evans Lance, RN  1300: Infusions complete.  Port de-accessed per policy. Evans Lance, RN  [phone]: Pt left via w/c with family. No complaints voiced other than continued back pain. Evans Lance, RN

## 2023-04-25 NOTE — Cancer Center Note (Signed)
Department of Hematology/Oncology  Progress Note   Name: Kelsey Preston  ZOX:[id number]  Date of Birth: 1940/08/17  Encounter Date: 04/25/2023    REFERRING PROVIDER:  Charm Rings, MD  [address]  [place],  [place] [address]    REASON FOR OFFICE VISIT:  Breast Cancer     HISTORY OF PRESENT ILLNESS:  Kelsey Preston is a 83 y.o. female who presents today for follow up of breast cancer     The patient has a history of non-small-cell lung cancer, status post lobectomy in 2014.      She also has a history of endometrial cancer, and is status post surgical resection of that cancer as well.    More recently, the patient was found to have a left axillary lesion measuring 2.3 cm on PET-CT scan.  For some reason, I can not find an actual size measurement on the pathology report.  Multiple imaging studies of the breast were performed, but no primary lesion was found.  The malignant lesion was definitively classified as a lymph node.    Receptor staining showed that it was ER negative and HER2 Neu positive.  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    The patient was hospitalized from 7/22 to 10/09/22 while on treatment with paclitaxel and trastuzumab due to neutropenia and hypotension. She has also had electrolyte issues, with her magnesium level being somewhat low.  Her Echo completed on 05/31/22 showed an Ejection Fraction of 60-65%. Her follow up Echo on 09/24/22 showed EF of 54% and Echo on 09/24/22 showed EF of 50-55%. Dr. [name] was notified of these results and he recommended holding Kinjinti and repeating Echo prior to her next dose of Kinjinti. She had a recent follow up echocardiogram completed on 01/23/23 which showed complete recovery.   In December 2024 she developed shingles on her left chest, around her left side and back. She completed Valacyclovir and was placed on Gabapentin 100 mg TID to help with the pain. She continues to have some pain, itching and burning in the affected area but otherwise recovering  well.      04/23/23:  Patient is here today for treatment with Kanjinti for breast cancer.  She reports that she is doing well.  Her husband reports that he has noticed improvement in her.  She does still have weakness and uses a walker and wheelchair as needed to get around.  She has an echo scheduled for later today.    ROS:   Pertinent review of systems as discussed in HPI    HISTORY:  Past Medical History:   Diagnosis Date    Diabetes mellitus, type 2 (CMS HCC)     Embolism (CMS HCC)     left leg    Endometrial cancer (CMS HCC)     HTN (hypertension)     Hx of breast cancer     Lung cancer (CMS HCC)     Macular degeneration (senile) of retina      Past Surgical History:   Procedure Laterality Date    HIP SURGERY Left     HX CHOLECYSTECTOMY      HX COLONOSCOPY      HX HYSTERECTOMY      HX LOBECTOMY Left     LUNG CANCER SURGERY      PORTACATH PLACEMENT       Social History     Socioeconomic History    Marital status: Married     Spouse name: Not on file  Number of children: Not on file    Years of education: Not on file    Highest education level: Not on file   Occupational History    Not on file   Tobacco Use    Smoking status: Never    Smokeless tobacco: Never   Vaping Use    Vaping status: Never Used   Substance and Sexual Activity    Alcohol use: Never    Drug use: Never    Sexual activity: Not Currently   Other Topics Concern    Not on file   Social History Narrative    Not on file     Social Determinants of Health     Financial Resource Strain: Medium Risk (01/29/2023)    Received from [facility]    Overall Financial Resource Strain (CARDIA)     Difficulty of Paying Living Expenses: Somewhat hard   Transportation Needs: No Transportation Needs (01/29/2023)    Received from [facility] - Transportation     Lack of Transportation (Medical): No     Lack of Transportation (Non-Medical): No   Social Connections: Moderately Integrated (01/29/2023)    Received from [facility]    Social  Connection and Isolation Panel [NHANES]     Frequency of Communication with Friends and Family: More than three times a week     Frequency of Social Gatherings with Friends and Family: More than three times a week     Attends Religious Services: More than 4 times per year     Active Member of [employer] or Organizations: No     Attends Banker Meetings: Never     Marital Status: Married   Catering manager Violence: Not At Risk (01/29/2023)    Received from [employer], Afraid, Rape, and Kick questionnaire     Fear of Current or Ex-Partner: No     Emotionally Abused: No     Physically Abused: No     Sexually Abused: No   Housing Stability: Low Risk  (01/29/2023)    Received from [facility] Stability Vital Sign     Unable to Pay for Housing in the Last Year: No     Number of Times Moved in the Last Year: 1     Homeless in the Last Year: No     Family Medical History:       Problem Relation (Age of Onset)    Cancer Brother            Current Outpatient Medications   Medication Sig    atorvastatin (LIPITOR) 40 mg Oral Tablet Take 1 Tablet (40 mg total) by mouth Every evening for 30 days    carvediloL (COREG) 12.5 mg Oral Tablet Take 1 Tablet (12.5 mg total) by mouth Twice daily    esomeprazole magnesium (NEXIUM) 40 mg Oral Capsule, Delayed Release(E.C.) Take 1 Capsule (40 mg total) by mouth Every morning before breakfast    ezetimibe (ZETIA) 10 mg Oral Tablet Take 1 Tablet (10 mg total) by mouth Every evening    famotidine (PEPCID) 40 mg Oral Tablet Take 1 Tablet (40 mg total) by mouth Once a day    gabapentin (NEURONTIN) 100 mg Oral Capsule Take 1 Capsule (100 mg total) by mouth Three times a day    lidocaine-prilocaine (EMLA) 2.5-2.5 % Cream One time    magnesium oxide (MAG-OX) 400 mg Oral Tablet Take 1 Tablet (400 mg total) by mouth  Twice daily    metFORMIN (GLUCOPHAGE XR) 500 mg Oral Tablet Sustained Release 24 hr Take 1 Tablet (500 mg total) by mouth Twice daily     methocarbamoL (ROBAXIN) 750 mg Oral Tablet Take 1 Tablet (750 mg total) by mouth Four times a day    multivitamin with iron Oral Tablet Take 1 Tablet by mouth Once a day    ondansetron (ZOFRAN ODT) 4 mg Oral Tablet, Rapid Dissolve Take 1 Tablet (4 mg total) by mouth Every 8 hours as needed for Nausea/Vomiting Indications: prevent nausea and vomiting from cancer chemotherapy    sertraline (ZOLOFT) 50 mg Oral Tablet Take 1 Tablet (50 mg total) by mouth Once a day    traMADoL (ULTRAM) 50 mg Oral Tablet Take 1 Tablet (50 mg total) by mouth Every 4 hours as needed for Pain    vit C/E/Zn/coppr/lutein/zeaxan (PRESERVISION AREDS-2 ORAL) Take 1 Tablet by mouth Twice daily    XARELTO 20 mg Oral Tablet TAKE 1 TABLET BY MOUTH EVERY EVENING WITH DINNER     Allergies   Allergen Reactions    Adhesive Rash    Ceclor [Cefaclor]  Other Adverse Reaction (Add comment)     Pt states a Ceclor pill gave her blisters in her mouth.       PHYSICAL EXAM:  BP 136/86 (Site: Left Arm, Patient Position: Sitting, Cuff Size: Adult)   Pulse 76   Temp (!) 35.9 C (96.7 F) (Temporal)   Ht 1.626 m (5\' 4" )   Wt 87.1 kg (192 lb 1.6 oz)   SpO2 97%   BMI 32.97 kg/m        ECOG Status: (1) Restricted in physically strenuous activity, ambulatory and able to do work of light nature   Physical Exam  Vitals and nursing note reviewed.   Constitutional:       Appearance: Normal appearance.   HENT:      Head: Normocephalic.      Nose: Nose normal.      Mouth/Throat:      Mouth: Mucous membranes are moist.      Pharynx: Oropharynx is clear.   Eyes:      General: No scleral icterus.     Extraocular Movements: Extraocular movements intact.   Cardiovascular:      Rate and Rhythm: Normal rate and regular rhythm.      Pulses: Normal pulses.      Heart sounds: Normal heart sounds.   Pulmonary:      Effort: Pulmonary effort is normal.      Breath sounds: Normal breath sounds.   Abdominal:      General: Bowel sounds are normal.      Palpations: Abdomen is soft.    Musculoskeletal:         General: Normal range of motion.      Cervical back: Normal range of motion and neck supple.   Skin:     General: Skin is warm and dry.   Neurological:      General: No focal deficit present.      Mental Status: She is alert and oriented to person, place, and time. Mental status is at baseline.   Psychiatric:         Mood and Affect: Mood normal.         Behavior: Behavior normal.         Thought Content: Thought content normal.         Judgment: Judgment normal.  DIAGNOSTIC DATA:  Results for orders placed during the hospital encounter of 01/23/23    PET NW:GNFA (HEAD TO THIGH) WO IV CONTRAST    Narrative  Nakhia Ardis    RADIOLOGIST: Karolee Stamps    PET CT SUBSEQUENT: BODY (HEAD TO THIGH) WO IV CONTRAST performed on 01/23/2023 1:38 PM    CLINICAL HISTORY: C50.919: Breast cancer (CMS HCC).  BREAST CANCER/CURRENTLY UNDER GOING TREATMENT FOR LEFT BREAST CANCER/HX OF ENDOMETRIAL AND LUNG CA EXCISIONAL BIOPSY LEFT AXILLA 08/07/2022.    Subsequent Treatment Strategy.    TECHNIQUE:  F-18 fluorodeoxyglucose intravenously with PET-CT from the skull base to the proximal thighs  Uptake Time: 60 minutes  Serum Glucose: 111 mg/dL at the time of injection  SUV Measurement Type: SUVmax  SUV Normalization Method: Body weight    RADIOPHARMACEUTICAL: 12.4 millicuries F-18 FDG    COMPARISON: PET/CT from 07/04/2022. CT abdomen and pelvis from 01/26/2023.    FINDINGS:  Reference Background Values:  Mediastinal Blood Pool: 2.47 SUV max  Volumetric Normal Liver: 3.39 SUV max    HEAD AND NECK: No suspicious focal hypermetabolic activity. No hypermetabolic cervical or supraclavicular lymph nodes. No hypermetabolic parotid gland lesion is seen. No suspicious hypermetabolic activity about the pharynx.    CHEST: The previously seen hypermetabolic left axillary lesion-lymph node on PET/CT from 07/04/2022 is no longer identified. There are no current hypermetabolic mediastinal, hilar, internal mammary or  axillary lymph nodes identified. No hypermetabolic lung lesion. No pleural effusion.    ABDOMEN AND PELVIS: No suspicious focal hypermetabolic activity is identified. No ascites. No hydronephrosis. Genitourinary activity appears physiologic. Left adrenal gland nodularity is unchanged. Stable borderline enlargement of the spleen. No convincing focal hypermetabolic activity involving the stomach to confirm gastritis or focal active inflammation.    The majority of the colon is again seen to be partially decompressed similar to CT from 01-26-2023 with some liquid stool again suggested within the colon which may be related to diarrheal state. No suspicious focal increased hypermetabolic activity within the colon is seen. Generalized metabolic activity throughout the colon as well as rectum is present and may be physiologic. Please correlate with clinical history for possible degree of generalized nonspecific enteritis-colitis with possible diarrhea. These findings are also suggested on the prior CT from January 26, 2023.    MUSCULOSKELETAL: Continued slight increase concavity of the L1 vertebral body inferior endplate is suggested on CT localizer images compatible with a compression fracture without bony retropulsion when compared to 01/26/2023. Some relatively low level metabolic activity essentially paralleling the L1 vertebral body inferior endplate is present without discrete bone lesion seen on CT localizer images.    No osseous destructive lesion is seen. Postsurgical hardware about the proximal left femur again partially visualized. Discogenic and degenerative arthritic changes of the spine again seen at multiple levels. No retroperitoneal hematoma. Psoas musculature is grossly symmetric.    Impression  1. L1 VERTEBRAL BODY MILD COMPRESSION FRACTURE DEFORMITY WITH CONCAVITY OF THE INFERIOR ENDPLATE WHICH MAY BE SLIGHTLY MORE PRONOUNCED WHEN COMPARED TO 01/26/23. SOME METABOLIC ACTIVITY WITHIN THE VERTEBRAL BODY ALONG  THE INFERIOR ENDPLATE IS PRESENT AND COULD BE RELATED TO RECENT FRACTURE. PLEASE CONSIDER MRI PREFERABLY WITHOUT AND WITH IV CONTRAST FOR FURTHER AND MORE DEFINITIVE EVALUATION OF THE LUMBAR SPINE PARTICULARLY THE L1 LEVEL AND HELP COMPLETELY EXCLUDE THE POSSIBILITY OF A PATHOLOGIC FRACTURE.    2. NO CURRENT SUSPICIOUS FOCAL HYPERMETABOLIC ACTIVITY INVOLVING THE SOFT TISSUES OF THE NECK, CHEST, ABDOMEN OR PELVIS TO SUGGEST MALIGNANCY-METASTASIS WHEN COMPARED TO 07/04/2022.  3. PREVIOUSLY SEEN HYPERMETABOLIC LEFT AXILLARY LESION-LYMPH NODE ON 07/04/2022 IS NO LONGER PRESENT.    4. PERSISTENT IMAGING FINDINGS SUGGESTING A POSSIBLE DEGREE OF NONSPECIFIC ENTERITIS -COLITIS WHEN COMPARED TO CT FROM 01/05/2023. PLEASE CORRELATE WITH CLINICAL FINDINGS AND PATIENT HISTORY.        Radiologist location ID: [id number]         LABS:   CBC  Diff   Lab Results   Component Value Date/Time    WBC 3.9 04/25/2023 09:23 AM    HGB 11.2 04/25/2023 09:23 AM    HCT 34.2 04/25/2023 09:23 AM    PLTCNT 139 (L) 04/25/2023 09:23 AM    RBC 4.05 04/25/2023 09:23 AM    MCV 84.5 04/25/2023 09:23 AM    MCHC 32.7 04/25/2023 09:23 AM    MCH 27.6 04/25/2023 09:23 AM    RDW 15.9 04/25/2023 09:23 AM    MPV 9.2 04/25/2023 09:23 AM    Lab Results   Component Value Date/Time    PMNS 44 04/25/2023 09:23 AM    LYMPHOCYTES 43 04/25/2023 09:23 AM    EOSINOPHIL 3 04/25/2023 09:23 AM    MONOCYTES 10 04/25/2023 09:23 AM    BASOPHILS 1 04/25/2023 09:23 AM    BASOPHILS 0.00 04/25/2023 09:23 AM    PMNABS 1.70 (L) 04/25/2023 09:23 AM    LYMPHSABS 1.70 04/25/2023 09:23 AM    EOSABS 0.10 04/25/2023 09:23 AM    MONOSABS 0.40 04/25/2023 09:23 AM    BASABS 0.04 05/30/2022 05:40 AM            Comprehensive Metabolic Profile    Lab Results   Component Value Date    SODIUM 139 04/25/2023    POTASSIUM 4.2 04/25/2023    CHLORIDE 107 04/25/2023    CO2 27 04/25/2023    ANIONGAP 5 04/25/2023    BUN 14 04/25/2023    CREATININE 0.93 04/25/2023    ALBUMIN 3.4 (L) 04/25/2023     CALCIUM 9.6 04/25/2023    GLUCOSENF 135 (H) 04/25/2023    ALKPHOS 94 04/25/2023    ALT 27 04/25/2023    AST 32 04/25/2023    TOTBILIRUBIN 0.5 04/25/2023    TOTALPROTEIN 6.4 04/25/2023         ESTIMATED GFR   Date Value Ref Range Status   04/25/2023 62 >59 mL/min/1.1m^2 Final     ASSESSMENT:      ICD-10-CM    1. Malignant neoplasm of left breast in female, estrogen receptor negative, unspecified site of breast (CMS HCC)  C50.912     Z17.1       2. History of lung cancer  Z85.118       3. History of endometrial cancer  Z85.42       4. Encounter for monitoring cardiotoxic drug therapy  Z51.81     Z79.899       5. Hypomagnesemia  E83.42                    PLAN:   1. Breast cancer:  Status post resection of an involved lymph node that was 2.3 cm on PET/CT.  No primary lesion was seen on imaging studies.  T0 N1 M0, stage IIA.  ER negative, HER2 Neu positive.  After resection of the node, she was rendered cancer-free radiographically.  She is on treatment with adjuvant paclitaxel and trastuzumab.  We had previously discussed the options of either no additional surgery and taking our chances versus radiation of the breast versus mastectomy.  Radiation to  the axillary area will likely be required due to the lymph node that was known to be involved.  Patient has seen Dr. Midge Minium and according to his note he would favor radiation if her Signatera was positive, however her Signatera was negative on 02/18/2023.  Her cardiac function is acceptable, and she will continue trastuzumab adjuvant therapy. Her next ECHO is scheduled today @ 2:00 pm.   2. History of lung cancer:  Continue surveillance.    3. History of gynecologic cancer: Continue surveillance as well.  4. Compression fracture: The patient had a recent compression fracture after suffering a fall.  I do not have any reason to suspect that it was pathologic in nature.  We discussed use of Prolia in order to prevent future skeletal related events.  She received her 1st  dose of Prolia on 02/12/23 and is scheduled for her next dose 08/20/23.    5. Shingles: resolving, post Acyclovir and currently on Gabapentin.     Cornella Emmer was given the chance to ask questions, and these were answered to their satisfaction. The patient is welcome to call with any questions or concerns in the meantime.     On the day of the encounter, a total of 40 minutes was spent on this patient encounter including review of historical information, examination, documentation and post-visit activities.   Return in about 3 weeks (around 05/16/2023).   Marvene Staff APRN, FNP-BC, AOCNP, 04/25/2023 , 10:35   You can see your note(s) in [facility]. It is common for you to encounter certain medical terminology which may be unfamiliar to you. You might see results before your provider does so please give at least 2 business days for review. Please have this understanding, that NOT all abnormal results are significant. Our office will contact you for any urgent or emergent action if necessary. If you have any questions or concerns, feel free to send a MyChart message or call the office. Please call with any new or concerning symptoms.   The patient's insurance company bears full legal and financial responsibility resulting from any deviations that they cause to my recommended treatment plan.   CC:  Charm Rings, MD  [address]  [place] [address]    Charm Rings, MD  [address]  [place],  [place] [address]    This note was partially generated using MModal Fluency Direct system, and there may be some incorrect words, spellings, and punctuation that were not noted in checking the note before saving.

## 2023-04-29 ENCOUNTER — Encounter (INDEPENDENT_AMBULATORY_CARE_PROVIDER_SITE_OTHER): Payer: Self-pay | Admitting: HEMATOLOGY-ONCOLOGY

## 2023-05-01 ENCOUNTER — Encounter (INDEPENDENT_AMBULATORY_CARE_PROVIDER_SITE_OTHER): Payer: Self-pay | Admitting: HEMATOLOGY-ONCOLOGY

## 2023-05-02 ENCOUNTER — Ambulatory Visit (HOSPITAL_COMMUNITY): Payer: Medicare Other

## 2023-05-07 ENCOUNTER — Encounter (INDEPENDENT_AMBULATORY_CARE_PROVIDER_SITE_OTHER): Payer: Self-pay | Admitting: HEMATOLOGY-ONCOLOGY

## 2023-05-09 ENCOUNTER — Encounter (INDEPENDENT_AMBULATORY_CARE_PROVIDER_SITE_OTHER): Payer: Self-pay | Admitting: HEMATOLOGY-ONCOLOGY

## 2023-05-09 ENCOUNTER — Ambulatory Visit (HOSPITAL_COMMUNITY): Payer: Medicare Other

## 2023-05-13 LAB — SURGICAL PATHOLOGY SPECIMEN

## 2023-05-16 ENCOUNTER — Ambulatory Visit (INDEPENDENT_AMBULATORY_CARE_PROVIDER_SITE_OTHER)
Admission: RE | Admit: 2023-05-16 | Discharge: 2023-05-16 | Disposition: A | Discharge status: Home / Self Care | Payer: Self-pay | Source: Ambulatory Visit | Attending: NURSE PRACTITIONER

## 2023-05-16 ENCOUNTER — Other Ambulatory Visit (INDEPENDENT_AMBULATORY_CARE_PROVIDER_SITE_OTHER): Payer: Self-pay | Admitting: HEMATOLOGY-ONCOLOGY

## 2023-05-16 ENCOUNTER — Other Ambulatory Visit: Payer: Self-pay

## 2023-05-16 ENCOUNTER — Ambulatory Visit
Admission: RE | Admit: 2023-05-16 | Discharge: 2023-05-16 | Disposition: A | Discharge status: Home / Self Care | Payer: Medicare Other | Source: Ambulatory Visit | Attending: NURSE PRACTITIONER | Admitting: NURSE PRACTITIONER

## 2023-05-16 ENCOUNTER — Encounter (INDEPENDENT_AMBULATORY_CARE_PROVIDER_SITE_OTHER): Payer: Self-pay | Admitting: NURSE PRACTITIONER

## 2023-05-16 ENCOUNTER — Ambulatory Visit (INDEPENDENT_AMBULATORY_CARE_PROVIDER_SITE_OTHER): Payer: Self-pay | Admitting: NURSE PRACTITIONER

## 2023-05-16 ENCOUNTER — Ambulatory Visit (HOSPITAL_COMMUNITY): Payer: Medicare Other

## 2023-05-16 VITALS — BP 140/70 | HR 68 | Temp 97.5°F | Ht 64.0 in | Wt 191.3 lb

## 2023-05-16 VITALS — BP 153/80 | HR 73 | Temp 97.3°F | Resp 18

## 2023-05-16 DIAGNOSIS — Z87312 Personal history of (healed) stress fracture: Secondary | ICD-10-CM | POA: Insufficient documentation

## 2023-05-16 DIAGNOSIS — R12 Heartburn: Secondary | ICD-10-CM | POA: Insufficient documentation

## 2023-05-16 DIAGNOSIS — Z902 Acquired absence of lung [part of]: Secondary | ICD-10-CM | POA: Insufficient documentation

## 2023-05-16 DIAGNOSIS — A419 Sepsis, unspecified organism: Secondary | ICD-10-CM

## 2023-05-16 DIAGNOSIS — C50919 Malignant neoplasm of unspecified site of unspecified female breast: Secondary | ICD-10-CM

## 2023-05-16 DIAGNOSIS — Z5112 Encounter for antineoplastic immunotherapy: Secondary | ICD-10-CM | POA: Insufficient documentation

## 2023-05-16 DIAGNOSIS — Z79620 Long term (current) use of immunosuppressive biologic: Secondary | ICD-10-CM | POA: Insufficient documentation

## 2023-05-16 DIAGNOSIS — Z86718 Personal history of other venous thrombosis and embolism: Secondary | ICD-10-CM | POA: Insufficient documentation

## 2023-05-16 DIAGNOSIS — Z171 Estrogen receptor negative status [ER-]: Secondary | ICD-10-CM | POA: Insufficient documentation

## 2023-05-16 DIAGNOSIS — Z85118 Personal history of other malignant neoplasm of bronchus and lung: Secondary | ICD-10-CM | POA: Insufficient documentation

## 2023-05-16 DIAGNOSIS — Z90710 Acquired absence of both cervix and uterus: Secondary | ICD-10-CM | POA: Insufficient documentation

## 2023-05-16 DIAGNOSIS — I82412 Acute embolism and thrombosis of left femoral vein: Secondary | ICD-10-CM

## 2023-05-16 DIAGNOSIS — Z9889 Other specified postprocedural states: Secondary | ICD-10-CM | POA: Insufficient documentation

## 2023-05-16 DIAGNOSIS — B029 Zoster without complications: Secondary | ICD-10-CM | POA: Insufficient documentation

## 2023-05-16 DIAGNOSIS — Z79899 Other long term (current) drug therapy: Secondary | ICD-10-CM | POA: Insufficient documentation

## 2023-05-16 DIAGNOSIS — Z1731 Human epidermal growth factor receptor 2 positive status: Secondary | ICD-10-CM | POA: Insufficient documentation

## 2023-05-16 DIAGNOSIS — C50612 Malignant neoplasm of axillary tail of left female breast: Secondary | ICD-10-CM | POA: Insufficient documentation

## 2023-05-16 DIAGNOSIS — Z8542 Personal history of malignant neoplasm of other parts of uterus: Secondary | ICD-10-CM | POA: Insufficient documentation

## 2023-05-16 LAB — CBC WITH DIFF
BASOPHIL #: 0 10*3/uL (ref 0.00–0.10)
BASOPHIL %: 0 % (ref 0–1)
EOSINOPHIL #: 0.1 10*3/uL (ref 0.00–0.50)
EOSINOPHIL %: 2 % (ref 1–7)
HCT: 33.9 % (ref 31.2–41.9)
HGB: 11 g/dL (ref 10.9–14.3)
LYMPHOCYTE #: 1.2 10*3/uL (ref 1.10–3.10)
LYMPHOCYTE %: 32 % (ref 16–46)
MCH: 27.5 pg (ref 24.7–32.8)
MCHC: 32.5 g/dL (ref 32.3–35.6)
MCV: 84.7 fL (ref 75.5–95.3)
MONOCYTE #: 0.4 10*3/uL (ref 0.20–0.90)
MONOCYTE %: 10 % (ref 4–11)
MPV: 9.2 fL (ref 7.9–10.8)
NEUTROPHIL #: 2.1 10*3/uL (ref 1.90–8.20)
NEUTROPHIL %: 56 % (ref 43–77)
PLATELETS: 137 10*3/uL — ABNORMAL LOW (ref 140–440)
RBC: 4 10*6/uL (ref 3.63–4.92)
RDW: 16.4 % (ref 12.3–17.7)
WBC: 3.7 10*3/uL — ABNORMAL LOW (ref 3.8–11.8)

## 2023-05-16 LAB — COMPREHENSIVE METABOLIC PANEL, NON-FASTING
ALBUMIN/GLOBULIN RATIO: 1.3 (ref 0.8–1.4)
ALBUMIN: 3.4 g/dL — ABNORMAL LOW (ref 3.5–5.7)
ALKALINE PHOSPHATASE: 88 U/L (ref 34–104)
ALT (SGPT): 19 U/L (ref 7–52)
ANION GAP: 5 mmol/L (ref 4–13)
AST (SGOT): 27 U/L (ref 13–39)
BILIRUBIN TOTAL: 0.7 mg/dL (ref 0.3–1.0)
BUN/CREA RATIO: 13 (ref 6–22)
BUN: 14 mg/dL (ref 7–25)
CALCIUM, CORRECTED: 10.6 mg/dL (ref 8.9–10.8)
CALCIUM: 10.1 mg/dL (ref 8.6–10.3)
CHLORIDE: 104 mmol/L (ref 98–107)
CO2 TOTAL: 29 mmol/L (ref 21–31)
CREATININE: 1.04 mg/dL (ref 0.60–1.30)
ESTIMATED GFR: 54 mL/min/{1.73_m2} — ABNORMAL LOW (ref >59)
GLOBULIN: 2.7 (ref 2.0–3.5)
GLUCOSE: 194 mg/dL — ABNORMAL HIGH (ref 74–109)
OSMOLALITY, CALCULATED: 282 mosm/kg (ref 270–290)
POTASSIUM: 4.5 mmol/L (ref 3.5–5.1)
PROTEIN TOTAL: 6.1 g/dL — ABNORMAL LOW (ref 6.4–8.9)
SODIUM: 138 mmol/L (ref 136–145)

## 2023-05-16 LAB — MAGNESIUM: MAGNESIUM: 1.7 mg/dL — ABNORMAL LOW (ref 1.9–2.7)

## 2023-05-16 MED ORDER — ALBUTEROL SULFATE 2.5 MG/3 ML (0.083 %) SOLUTION FOR NEBULIZATION
2.5000 mg | INHALATION_SOLUTION | Freq: Once | RESPIRATORY_TRACT | Status: DC | PRN
Start: 2023-05-16 — End: 2023-05-17

## 2023-05-16 MED ORDER — DEXTROSE 5% IN WATER (D5W) FLUSH BAG - 250 ML
INTRAVENOUS | Status: DC | PRN
Start: 2023-05-16 — End: 2023-05-17

## 2023-05-16 MED ORDER — FAMOTIDINE 40 MG TABLET
40.0000 mg | ORAL_TABLET | Freq: Two times a day (BID) | ORAL | 2 refills | Status: AC
Start: 2023-05-16 — End: ?

## 2023-05-16 MED ORDER — SODIUM CHLORIDE 0.9% FLUSH BAG - 250 ML
INTRAVENOUS | Status: DC | PRN
Start: 2023-05-16 — End: 2023-05-17

## 2023-05-16 MED ORDER — MAGNESIUM SULFATE 1 GRAM/100 ML IN DEXTROSE 5 % INTRAVENOUS PIGGYBACK
1.0000 g | INJECTION | INTRAVENOUS | Status: AC
Start: 2023-05-16 — End: 2023-05-16
  Administered 2023-05-16: 0 g via INTRAVENOUS
  Administered 2023-05-16 (×2): 1 g via INTRAVENOUS
  Administered 2023-05-16: 0 g via INTRAVENOUS
  Filled 2023-05-16: qty 100

## 2023-05-16 MED ORDER — EPINEPHRINE 1 MG/ML (1 ML) INJECTION SOLUTION
0.3000 mg | Freq: Once | INTRAMUSCULAR | Status: DC | PRN
Start: 2023-05-16 — End: 2023-05-17

## 2023-05-16 MED ORDER — FAMOTIDINE (PF) 20 MG/2 ML INTRAVENOUS SOLUTION
20.0000 mg | Freq: Once | INTRAVENOUS | Status: DC | PRN
Start: 2023-05-16 — End: 2023-05-17

## 2023-05-16 MED ORDER — GABAPENTIN 100 MG CAPSULE
100.0000 mg | ORAL_CAPSULE | Freq: Three times a day (TID) | ORAL | 0 refills | Status: DC
Start: 2023-05-16 — End: 2023-07-01

## 2023-05-16 MED ORDER — DIPHENHYDRAMINE 50 MG/ML INJECTION SOLUTION
50.0000 mg | Freq: Once | INTRAMUSCULAR | Status: DC | PRN
Start: 2023-05-16 — End: 2023-05-17

## 2023-05-16 MED ORDER — HYDROCORTISONE SOD SUCCINATE 100 MG/2 ML VIAL WRAPPER
100.0000 mg | Freq: Once | INTRAMUSCULAR | Status: DC | PRN
Start: 2023-05-16 — End: 2023-05-17

## 2023-05-16 MED ORDER — DIPHENHYDRAMINE 50 MG/ML INJECTION SOLUTION
25.0000 mg | Freq: Once | INTRAMUSCULAR | Status: DC | PRN
Start: 2023-05-16 — End: 2023-05-17

## 2023-05-16 MED ORDER — TRASTUZUMAB-ANNS 420 MG INTRAVENOUS SOLUTION
6.0000 mg/kg | Freq: Once | INTRAVENOUS | Status: AC
Start: 2023-05-16 — End: 2023-05-16
  Administered 2023-05-16: 525 mg via INTRAVENOUS
  Administered 2023-05-16: 0 mg via INTRAVENOUS
  Filled 2023-05-16: qty 25

## 2023-05-16 MED ORDER — MEPERIDINE (PF) 25 MG/ML INJECTION SOLUTION
12.5000 mg | Freq: Once | INTRAMUSCULAR | Status: DC | PRN
Start: 2023-05-16 — End: 2023-05-17

## 2023-05-16 MED ORDER — ALBUTEROL SULFATE HFA 90 MCG/ACTUATION AEROSOL INHALER - RN
2.0000 | Freq: Once | RESPIRATORY_TRACT | Status: DC | PRN
Start: 2023-05-16 — End: 2023-05-17

## 2023-05-16 NOTE — Nurses Notes (Signed)
 1034-Patient to room by wheelchair from doctor's office.  Patient reports recent N/V and diarrhea.  Prn medications at home controlling those symptoms  Good appetite reported.  Patient is oriented x4, lung sounds are clear, and normal heart sounds present.  Vitals are stable.Salli Quarry, RN       Patient Assessment/Symptom Management Patient Has MD Appointment Today   Key: (+) Symptom present           (-)  Symptom not present If Symptom is Positive(+) a Nursing Note is required   Edema -   Nausea +   Vomiting +   Inability to eat/drink -   Mouth Sores -   Diarrhea +   Constipation (? Last BM) -   Fatigue that interferes with ADL's +   Numbness/Tingling -change -   Other -   Fever/Signs & Symptoms of infection -   Nurse Initials KB         1045-PAC accessed and flushed per protocol.  Blood return obtained.Salli Quarry, RN  1059-Magnesium infusion started (2 grams total to infuse).Salli Quarry, RN  1130-KANJINTI infusion started.Salli Quarry, RN  1200-KANJINTI infusion complete.Salli Quarry, RN  1204-Magnesium infusion complete.Salli Quarry, RN  1235-PAC flushed and deaccessed.  Blood return obtained prior to Janeece Riggers, RN  1237-Patient checking out at this time.  Vitals are stable.Salli Quarry, RN

## 2023-05-16 NOTE — Cancer Center Note (Signed)
 Department of Hematology/Oncology  Progress Note   Name: Kelsey Preston  JWJ:[id number]  Date of Birth: 1940-05-26  Encounter Date: 05/16/2023    REFERRING PROVIDER:  Charm Rings, MD  [address]  [place],  [place] [address]    REASON FOR OFFICE VISIT:  Breast Cancer     HISTORY OF PRESENT ILLNESS:  Kelsey Preston is a 83 y.o. female who presents today for follow up of breast cancer     The patient has a history of non-small-cell lung cancer, status post lobectomy in 2014.      She also has a history of endometrial cancer, and is status post surgical resection of that cancer as well.    More recently, the patient was found to have a left axillary lesion measuring 2.3 cm on PET-CT scan.  For some reason, I can not find an actual size measurement on the pathology report.  Multiple imaging studies of the breast were performed, but no primary lesion was found.  The malignant lesion was definitively classified as a lymph node.    Receptor staining showed that it was ER negative and HER2 Neu positive.  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    The patient was hospitalized from 7/22 to 10/09/22 while on treatment with paclitaxel and trastuzumab due to neutropenia and hypotension. She has also had electrolyte issues, with her magnesium level being somewhat low.  Her Echo completed on 05/31/22 showed an Ejection Fraction of 60-65%. Her follow up Echo on 09/24/22 showed EF of 54% and Echo on 09/24/22 showed EF of 50-55%. Dr. [name] was notified of these results and he recommended holding Kinjinti and repeating Echo prior to her next dose of Kinjinti. She had a recent follow up echocardiogram completed on 01/23/23 which showed complete recovery.   In December 2024 she developed shingles on her left chest, around her left side and back. She completed Valacyclovir and was placed on Gabapentin 100 mg TID to help with the pain. She continues to have some pain, itching and burning in the affected area but otherwise recovering  well.      04/23/23:  Patient is here today for treatment with Kanjinti for breast cancer.  She reports that she is doing well.  Her husband reports that he has noticed improvement in her.  She does still have weakness and uses a walker and wheelchair as needed to get around.  She has an echo scheduled for later today.    05/16/23:  Patient is here for follow up of breast cancer and treatment with Kanjinti.  She reports that she is having increased issues with heartburn.  This caused her throw up last night.  She denies any other new symptoms.  She is continued issues with pain from her shingles.        ROS:   Pertinent review of systems as discussed in HPI    HISTORY:  Past Medical History:   Diagnosis Date    Diabetes mellitus, type 2 (CMS HCC)     Embolism (CMS HCC)     left leg    Endometrial cancer (CMS HCC)     HTN (hypertension)     Hx of breast cancer     Lung cancer (CMS HCC)     Macular degeneration (senile) of retina      Past Surgical History:   Procedure Laterality Date    HIP SURGERY Left     HX CHOLECYSTECTOMY      HX COLONOSCOPY  HX HYSTERECTOMY      HX LOBECTOMY Left     LUNG CANCER SURGERY      PORTACATH PLACEMENT       Social History     Socioeconomic History    Marital status: Married     Spouse name: Not on file    Number of children: Not on file    Years of education: Not on file    Highest education level: Not on file   Occupational History    Not on file   Tobacco Use    Smoking status: Never    Smokeless tobacco: Never   Vaping Use    Vaping status: Never Used   Substance and Sexual Activity    Alcohol use: Never    Drug use: Never    Sexual activity: Not Currently   Other Topics Concern    Not on file   Social History Narrative    Not on file     Social Determinants of Health     Financial Resource Strain: Medium Risk (01/29/2023)    Received from [facility]    Overall Financial Resource Strain (CARDIA)     Difficulty of Paying Living Expenses: Somewhat hard   Transportation Needs:  No Transportation Needs (01/29/2023)    Received from [facility] - Transportation     Lack of Transportation (Medical): No     Lack of Transportation (Non-Medical): No   Social Connections: Moderately Integrated (01/29/2023)    Received from [facility]    Social Connection and Isolation Panel [NHANES]     Frequency of Communication with Friends and Family: More than three times a week     Frequency of Social Gatherings with Friends and Family: More than three times a week     Attends Religious Services: More than 4 times per year     Active Member of [employer] or Organizations: No     Attends Banker Meetings: Never     Marital Status: Married   Catering manager Violence: Not At Risk (01/29/2023)    Received from [employer], Afraid, Rape, and Kick questionnaire     Fear of Current or Ex-Partner: No     Emotionally Abused: No     Physically Abused: No     Sexually Abused: No   Housing Stability: Low Risk  (01/29/2023)    Received from [facility] Stability Vital Sign     Unable to Pay for Housing in the Last Year: No     Number of Times Moved in the Last Year: 1     Homeless in the Last Year: No     Family Medical History:       Problem Relation (Age of Onset)    Cancer Brother            Current Outpatient Medications   Medication Sig    atorvastatin (LIPITOR) 40 mg Oral Tablet Take 1 Tablet (40 mg total) by mouth Every evening for 30 days    carvediloL (COREG) 12.5 mg Oral Tablet Take 1 Tablet (12.5 mg total) by mouth Twice daily    esomeprazole magnesium (NEXIUM) 40 mg Oral Capsule, Delayed Release(E.C.) Take 1 Capsule (40 mg total) by mouth Every morning before breakfast    ezetimibe (ZETIA) 10 mg Oral Tablet Take 1 Tablet (10 mg total) by mouth Every evening    famotidine (PEPCID) 40 mg Oral Tablet Take 1 Tablet (40  mg total) by mouth Twice daily    gabapentin (NEURONTIN) 100 mg Oral Capsule Take 1 Capsule (100 mg total) by mouth Three times a day     lidocaine-prilocaine (EMLA) 2.5-2.5 % Cream One time    magnesium oxide (MAG-OX) 400 mg Oral Tablet Take 1 Tablet (400 mg total) by mouth Twice daily    metFORMIN (GLUCOPHAGE XR) 500 mg Oral Tablet Sustained Release 24 hr Take 1 Tablet (500 mg total) by mouth Twice daily    multivitamin with iron Oral Tablet Take 1 Tablet by mouth Once a day    ondansetron (ZOFRAN ODT) 4 mg Oral Tablet, Rapid Dissolve Take 1 Tablet (4 mg total) by mouth Every 8 hours as needed for Nausea/Vomiting Indications: prevent nausea and vomiting from cancer chemotherapy    sertraline (ZOLOFT) 50 mg Oral Tablet Take 1 Tablet (50 mg total) by mouth Once a day    traMADoL (ULTRAM) 50 mg Oral Tablet Take 1 Tablet (50 mg total) by mouth Every 4 hours as needed for Pain    vit C/E/Zn/coppr/lutein/zeaxan (PRESERVISION AREDS-2 ORAL) Take 1 Tablet by mouth Twice daily    XARELTO 20 mg Oral Tablet TAKE 1 TABLET BY MOUTH EVERY EVENING WITH DINNER     Allergies   Allergen Reactions    Adhesive Rash    Ceclor [Cefaclor]  Other Adverse Reaction (Add comment)     Pt states a Ceclor pill gave her blisters in her mouth.       PHYSICAL EXAM:  BP (!) 140/70 (Site: Left Arm, Patient Position: Sitting, Cuff Size: Adult)   Pulse 68   Temp 36.4 C (97.5 F) (Temporal)   Ht 1.626 m (5\' 4" )   Wt 86.8 kg (191 lb 4.8 oz)   SpO2 96%   BMI 32.84 kg/m        ECOG Status: (1) Restricted in physically strenuous activity, ambulatory and able to do work of light nature   Physical Exam  Vitals and nursing note reviewed.   Constitutional:       Appearance: Normal appearance.   HENT:      Head: Normocephalic.      Nose: Nose normal.      Mouth/Throat:      Mouth: Mucous membranes are moist.      Pharynx: Oropharynx is clear.   Eyes:      General: No scleral icterus.     Extraocular Movements: Extraocular movements intact.   Cardiovascular:      Rate and Rhythm: Normal rate and regular rhythm.      Pulses: Normal pulses.      Heart sounds: Normal heart sounds.    Pulmonary:      Effort: Pulmonary effort is normal.      Breath sounds: Normal breath sounds.   Abdominal:      General: Bowel sounds are normal.      Palpations: Abdomen is soft.   Musculoskeletal:         General: Normal range of motion.      Cervical back: Normal range of motion and neck supple.   Skin:     General: Skin is warm and dry.   Neurological:      General: No focal deficit present.      Mental Status: She is alert and oriented to person, place, and time. Mental status is at baseline.   Psychiatric:         Mood and Affect: Mood normal.         Behavior:  Behavior normal.         Thought Content: Thought content normal.         Judgment: Judgment normal.       ECHO:  Results for orders placed during the hospital encounter of 04/25/23    TRANSTHORACIC ECHOCARDIOGRAM - ADULT    Narrative  **See full report in linked PDF document**  [facility]  [address] [place], [place] [address]    Transthoracic Echocardiographic Report    ______________________________________________________________________________  Name: Kelsey, Preston                                     MRN: [id number]               Weight: 206 lb  Study Date: 04/25/2023 01:38 PM                         DOB: Apr 10, 1940             Height: 64 in  Gender: Female                                          Age: 20 yrs                 BSA: 2.0 m2  Accession #: [id number]                              BP: 150/77 mmHg  Patient Location: PRN NON INVASIVE CARD PRN  Ordering Provider: Benjaman Pott  Tech: Jannett Celestine    ______________________________________________________________________________  Procedure:  Transthoracic complete echo with contrast, 2D, spectral and tissue Doppler, color flow Doppler, M-mode.    Quality:  The study images were of technically adequate quality.    Indications: Encounter for monitoring cardiotoxic drug therapy,Encounter for monitoring cardiotoxic drug therapy    Conclusions:  Normal left ventricular size and  systolic function; concentric remodeling.  No regional wall motion abnormlities.  Left ventricular EF: 65%.  Normal diastolic function.  Normal right ventricular size and systolic function.  Normal RA pressure (3 mmHg).  No pulmonary hypertension (RVSP ).  Normal aortic root and ascending aorta size.  No pericardial effusion.  No significant valvular heart disease. No aortic stenosis.  The ascending aorta is mildly dilated.  No acute changes are noted since prior examination of November 2024.    Findings  Left Ventricle:   Normal left ventricular size. Normal geometry. Left ventricular systolic function is normal. The left ventricular ejection fraction by visual assessment is estimated to be 65%. No  segmental/regional wall motion abnormalities identified. Left ventricular diastolic function could not be assessed due to the presence of AFib.  Right Ventricle:   Normal right ventricular size. Normal right ventricular systolic function. Right ventricular systolic pressure is normal.  Left Atrium:   Severely dilated left atrium.  Right Atrium:   The right atrium is of normal size.  Mitral Valve:   The mitral valve is normal. Trace mitral regurgitation present.  Tricuspid Valve:   The tricuspid valve is normal.  Aortic Valve:   The aortic valve is mildly calcified.  Pulmonic Valve:   The pulmonic valve is normal.  Atrial Septum:   The interatrial septum is normal in appearance.  IVC/Hepatic Veins:   Normal IVC size with >50% inspiratory collapse (estimated RA pressure 3 mmHg).  Aorta:   The aortic root is of normal size. The ascending aorta is mildly dilated.  Pericardium/Pleural space:   Normal pericardium with no pericardial effusion.    Electronically signed by: M.D. Lerry Liner on 04/25/2023 03:58 PM        DIAGNOSTIC DATA:  Results for orders placed during the hospital encounter of 01/23/23    PET ZO:XWRU (HEAD TO THIGH) WO IV CONTRAST    Narrative  Kelsey Preston    RADIOLOGIST: Karolee Stamps    PET CT  SUBSEQUENT: BODY (HEAD TO THIGH) WO IV CONTRAST performed on 01/23/2023 1:38 PM    CLINICAL HISTORY: C50.919: Breast cancer (CMS HCC).  BREAST CANCER/CURRENTLY UNDER GOING TREATMENT FOR LEFT BREAST CANCER/HX OF ENDOMETRIAL AND LUNG CA EXCISIONAL BIOPSY LEFT AXILLA 08/07/2022.    Subsequent Treatment Strategy.    TECHNIQUE:  F-18 fluorodeoxyglucose intravenously with PET-CT from the skull base to the proximal thighs  Uptake Time: 60 minutes  Serum Glucose: 111 mg/dL at the time of injection  SUV Measurement Type: SUVmax  SUV Normalization Method: Body weight    RADIOPHARMACEUTICAL: 12.4 millicuries F-18 FDG    COMPARISON: PET/CT from 07/04/2022. CT abdomen and pelvis from 01-07-23.    FINDINGS:  Reference Background Values:  Mediastinal Blood Pool: 2.47 SUV max  Volumetric Normal Liver: 3.39 SUV max    HEAD AND NECK: No suspicious focal hypermetabolic activity. No hypermetabolic cervical or supraclavicular lymph nodes. No hypermetabolic parotid gland lesion is seen. No suspicious hypermetabolic activity about the pharynx.    CHEST: The previously seen hypermetabolic left axillary lesion-lymph node on PET/CT from 07/04/2022 is no longer identified. There are no current hypermetabolic mediastinal, hilar, internal mammary or axillary lymph nodes identified. No hypermetabolic lung lesion. No pleural effusion.    ABDOMEN AND PELVIS: No suspicious focal hypermetabolic activity is identified. No ascites. No hydronephrosis. Genitourinary activity appears physiologic. Left adrenal gland nodularity is unchanged. Stable borderline enlargement of the spleen. No convincing focal hypermetabolic activity involving the stomach to confirm gastritis or focal active inflammation.    The majority of the colon is again seen to be partially decompressed similar to CT from 2023-01-07 with some liquid stool again suggested within the colon which may be related to diarrheal state. No suspicious focal increased hypermetabolic activity within  the colon is seen. Generalized metabolic activity throughout the colon as well as rectum is present and may be physiologic. Please correlate with clinical history for possible degree of generalized nonspecific enteritis-colitis with possible diarrhea. These findings are also suggested on the prior CT from 01-07-2023.    MUSCULOSKELETAL: Continued slight increase concavity of the L1 vertebral body inferior endplate is suggested on CT localizer images compatible with a compression fracture without bony retropulsion when compared to 01/07/2023. Some relatively low level metabolic activity essentially paralleling the L1 vertebral body inferior endplate is present without discrete bone lesion seen on CT localizer images.    No osseous destructive lesion is seen. Postsurgical hardware about the proximal left femur again partially visualized. Discogenic and degenerative arthritic changes of the spine again seen at multiple levels. No retroperitoneal hematoma. Psoas musculature is grossly symmetric.    Impression  1. L1 VERTEBRAL BODY MILD COMPRESSION FRACTURE DEFORMITY WITH CONCAVITY OF THE INFERIOR ENDPLATE WHICH MAY BE SLIGHTLY MORE PRONOUNCED WHEN COMPARED TO 01-07-2023. SOME METABOLIC ACTIVITY WITHIN THE VERTEBRAL BODY ALONG THE INFERIOR ENDPLATE IS PRESENT AND COULD BE RELATED TO RECENT  FRACTURE. PLEASE CONSIDER MRI PREFERABLY WITHOUT AND WITH IV CONTRAST FOR FURTHER AND MORE DEFINITIVE EVALUATION OF THE LUMBAR SPINE PARTICULARLY THE L1 LEVEL AND HELP COMPLETELY EXCLUDE THE POSSIBILITY OF A PATHOLOGIC FRACTURE.    2. NO CURRENT SUSPICIOUS FOCAL HYPERMETABOLIC ACTIVITY INVOLVING THE SOFT TISSUES OF THE NECK, CHEST, ABDOMEN OR PELVIS TO SUGGEST MALIGNANCY-METASTASIS WHEN COMPARED TO 07/04/2022.    3. PREVIOUSLY SEEN HYPERMETABOLIC LEFT AXILLARY LESION-LYMPH NODE ON 07/04/2022 IS NO LONGER PRESENT.    4. PERSISTENT IMAGING FINDINGS SUGGESTING A POSSIBLE DEGREE OF NONSPECIFIC ENTERITIS -COLITIS WHEN COMPARED TO CT FROM  01/05/2023. PLEASE CORRELATE WITH CLINICAL FINDINGS AND PATIENT HISTORY.        Radiologist location ID: [id number]         LABS:   CBC  Diff   Lab Results   Component Value Date/Time    WBC 3.7 (L) 05/16/2023 09:21 AM    HGB 11.0 05/16/2023 09:21 AM    HCT 33.9 05/16/2023 09:21 AM    PLTCNT 137 (L) 05/16/2023 09:21 AM    RBC 4.00 05/16/2023 09:21 AM    MCV 84.7 05/16/2023 09:21 AM    MCHC 32.5 05/16/2023 09:21 AM    MCH 27.5 05/16/2023 09:21 AM    RDW 16.4 05/16/2023 09:21 AM    MPV 9.2 05/16/2023 09:21 AM    Lab Results   Component Value Date/Time    PMNS 56 05/16/2023 09:21 AM    LYMPHOCYTES 32 05/16/2023 09:21 AM    EOSINOPHIL 2 05/16/2023 09:21 AM    MONOCYTES 10 05/16/2023 09:21 AM    BASOPHILS 0 05/16/2023 09:21 AM    BASOPHILS 0.00 05/16/2023 09:21 AM    PMNABS 2.10 05/16/2023 09:21 AM    LYMPHSABS 1.20 05/16/2023 09:21 AM    EOSABS 0.10 05/16/2023 09:21 AM    MONOSABS 0.40 05/16/2023 09:21 AM    BASABS 0.04 05/30/2022 05:40 AM            Comprehensive Metabolic Profile    Lab Results   Component Value Date    SODIUM 138 05/16/2023    POTASSIUM 4.5 05/16/2023    CHLORIDE 104 05/16/2023    CO2 29 05/16/2023    ANIONGAP 5 05/16/2023    BUN 14 05/16/2023    CREATININE 1.04 05/16/2023    ALBUMIN 3.4 (L) 05/16/2023    CALCIUM 10.1 05/16/2023    GLUCOSENF 194 (H) 05/16/2023    ALKPHOS 88 05/16/2023    ALT 19 05/16/2023    AST 27 05/16/2023    TOTBILIRUBIN 0.7 05/16/2023    TOTALPROTEIN 6.1 (L) 05/16/2023         ESTIMATED GFR   Date Value Ref Range Status   05/16/2023 54 (L) >59 mL/min/1.73m^2 Final     ASSESSMENT:      ICD-10-CM    1. Heartburn  R12 famotidine (PEPCID) 40 mg Oral Tablet                   PLAN:   1. Breast cancer:    Status post resection of an involved lymph node that was 2.3 cm on PET/CT.  No primary lesion was seen on imaging studies.  T0 N1 M0, stage IIA.  ER negative, HER2 Neu positive.  After resection of the node, she was rendered cancer-free radiographically.  She is on treatment with  adjuvant paclitaxel and trastuzumab.  We had previously discussed the options of either no additional surgery and taking our chances versus radiation of the breast versus mastectomy.  Radiation to the  axillary area will likely be required due to the lymph node that was known to be involved.  Patient has seen Dr. Midge Minium and according to his note he would favor radiation if her Signatera was positive, however her Signatera was negative on 02/18/2023.    Her cardiac function is acceptable, and she will continue trastuzumab adjuvant therapy. Her next ECHO is due around 08/13/23  2. History of lung cancer:    Continue surveillance.    3. History of gynecologic cancer:   Continue surveillance as well.  4. Compression fracture:   The patient had a recent compression fracture after suffering a fall.  I do not have any reason to suspect that it was pathologic in nature.  We discussed use of Prolia in order to prevent future skeletal related events.  She received her 1st dose of Prolia on 02/12/23 and is scheduled for her next dose 08/20/23.    5. Shingles:   resolving, post Acyclovir and currently on Gabapentin. I will see in a refill for this today.   6. Heartburn:   Increase Pepcid to 40 mg BID for short term control.     Kelsey Preston was given the chance to ask questions, and these were answered to their satisfaction. The patient is welcome to call with any questions or concerns in the meantime.     On the day of the encounter, a total of 30 minutes was spent on this patient encounter including review of historical information, examination, documentation and post-visit activities.   Return in about 3 weeks (around 06/06/2023).   Kelsey Staff APRN, FNP-BC, AOCNP, 05/16/2023 , 10:33   You can see your note(s) in [facility]. It is common for you to encounter certain medical terminology which may be unfamiliar to you. You might see results before your provider does so please give at least 2 business days for review. Please have  this understanding, that NOT all abnormal results are significant. Our office will contact you for any urgent or emergent action if necessary. If you have any questions or concerns, feel free to send a MyChart message or call the office. Please call with any new or concerning symptoms.   The patient's insurance company bears full legal and financial responsibility resulting from any deviations that they cause to my recommended treatment plan.   CC:  Charm Rings, MD  [address]  [place] [address]    Charm Rings, MD  [address]  [place],  [place] [address]    This note was partially generated using MModal Fluency Direct system, and there may be some incorrect words, spellings, and punctuation that were not noted in checking the note before saving.

## 2023-05-20 ENCOUNTER — Other Ambulatory Visit: Payer: Self-pay

## 2023-05-20 ENCOUNTER — Ambulatory Visit: Payer: Medicare Other

## 2023-05-20 ENCOUNTER — Ambulatory Visit
Admission: RE | Admit: 2023-05-20 | Discharge: 2023-05-20 | Disposition: A | Discharge status: Still In Hospital | Payer: Medicare Other | Source: Ambulatory Visit | Attending: RADIATION ONCOLOGY | Admitting: RADIATION ONCOLOGY

## 2023-05-20 ENCOUNTER — Ambulatory Visit (HOSPITAL_BASED_OUTPATIENT_CLINIC_OR_DEPARTMENT_OTHER)

## 2023-05-20 DIAGNOSIS — Z171 Estrogen receptor negative status [ER-]: Secondary | ICD-10-CM

## 2023-05-20 DIAGNOSIS — Z85118 Personal history of other malignant neoplasm of bronchus and lung: Secondary | ICD-10-CM | POA: Insufficient documentation

## 2023-05-20 DIAGNOSIS — C50919 Malignant neoplasm of unspecified site of unspecified female breast: Secondary | ICD-10-CM

## 2023-05-20 DIAGNOSIS — C50912 Malignant neoplasm of unspecified site of left female breast: Secondary | ICD-10-CM | POA: Insufficient documentation

## 2023-05-20 DIAGNOSIS — Z1731 Human epidermal growth factor receptor 2 positive status: Secondary | ICD-10-CM

## 2023-05-20 DIAGNOSIS — Z08 Encounter for follow-up examination after completed treatment for malignant neoplasm: Secondary | ICD-10-CM | POA: Insufficient documentation

## 2023-05-20 DIAGNOSIS — Z1722 Progesterone receptor negative status: Secondary | ICD-10-CM

## 2023-05-20 DIAGNOSIS — Z853 Personal history of malignant neoplasm of breast: Secondary | ICD-10-CM | POA: Insufficient documentation

## 2023-05-20 DIAGNOSIS — Z79620 Long term (current) use of immunosuppressive biologic: Secondary | ICD-10-CM | POA: Insufficient documentation

## 2023-05-20 NOTE — Progress Notes (Signed)
 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  She has completed adjuvant paclitaxel times 12 weeks in his now continuing trastuzumab.       Assessment:  83 year old doing well continuing on trastuzumab with no signs of recurrence on Signatera 3 months ago.    Oncology disease status: The patient has no clinical or biochemical evidence of cancer at this visit    Recommendations:  Repeat signal tear at this time and follow up in 3 months.  She is instructed to call in the interim should she have any problems or concerns.    The indications, time course, benefits, risks and side effects of radiation treatment were explained to the patient, and her questions were answered to her apparent satisfaction. I encouraged her to contact us at any time should she have any further questions or concerns. I personally saw and examined the patient, and reviewed all prior imaging and pathologic findings with her. I spent greater than 50% of a 30 minute visit in discussion of the patient's diagnosis and management.    FULL NOTE     Interval History :   Kelsey Preston is a 83 y.o. female with a history of 2 prior cancers in the distant past.  She had prior resection for a endometrial cancer as well as a primary lung cancer.  She has done well without evidence of recurrence of either of these cancers.  She was noted to have an enlarging axillary lymph node.  Testing including PET-CT showed uptake in the lymph node.  Biopsy was performed April of the 2nd that showed cancer.  Lymph node was resected May 21st.  Pathology showed 2.6 cm lymph node that was essentially entirely replaced by tumor.  Caris testing showed this to be ER negative PR negative HER2 Neu positive.  She was seen by Dr. [name] in his completed  course of paclitaxel with trastuzumab.  She will now continue trismus pertuzumab for entire year.  She has had some difficulty with neuropathy.  She has had 2 falls resulting in hip fracture and compression fracture of her spine.     She returns today with no new complaints and a recent mammogram dated May 14, 2023 that shows normal findings.  Her last Signatera in January was negative.  She has no new breast complaints.  She has had shingles but is doing better.    Pain Assessment:  Musculoskeletal aches and pains    Past Medical/Surgical History:  Past Medical History:   Diagnosis Date    Diabetes mellitus, type 2 (CMS HCC)     Embolism (CMS HCC)     left leg    Endometrial cancer (CMS HCC)     HTN (hypertension)     Hx of breast cancer     Lung cancer (CMS HCC)     Macular degeneration (senile) of retina          Past Surgical History:   Procedure Laterality Date    HIP SURGERY Left     HX CHOLECYSTECTOMY      HX COLONOSCOPY      HX HYSTERECTOMY      HX LOBECTOMY Left     LUNG CANCER SURGERY  PORTACATH PLACEMENT             Family History:   Family Medical History:       Problem Relation (Age of Onset)    Cancer Brother              Social History:   Social History     Socioeconomic History    Marital status: Married     Spouse name: Not on file    Number of children: Not on file    Years of education: Not on file    Highest education level: Not on file   Occupational History    Not on file   Tobacco Use    Smoking status: Never    Smokeless tobacco: Never   Vaping Use    Vaping status: Never Used   Substance and Sexual Activity    Alcohol use: Never    Drug use: Never    Sexual activity: Not Currently   Other Topics Concern    Not on file   Social History Narrative    Not on file     Social Determinants of Health     Financial Resource Strain: Medium Risk (01/29/2023)    Received from [facility]    Overall Financial Resource Strain (CARDIA)     Difficulty of Paying Living Expenses: Somewhat hard    Transportation Needs: No Transportation Needs (01/29/2023)    Received from [facility] - [facility]     Lack of Transportation (Medical): No     Lack of Transportation (Non-Medical): No   Social Connections: Moderately Integrated (01/29/2023)    Received from [facility]    Social Connection and Isolation Panel [NHANES]     Frequency of Communication with Friends and Family: More than three times a week     Frequency of Social Gatherings with Friends and Family: More than three times a week     Attends Religious Services: More than 4 times per year     Active Member of [employer] or Organizations: No     Attends Banker Meetings: Never     Marital Status: Married   Catering manager Violence: Not At Risk (01/29/2023)    Received from [employer], Afraid, Rape, and Kick questionnaire     Fear of Current or Ex-Partner: No     Emotionally Abused: No     Physically Abused: No     Sexually Abused: No   Housing Stability: Low Risk  (01/29/2023)    Received from [facility] Stability Vital Sign     Unable to Pay for Housing in the Last Year: No     Number of Times Moved in the Last Year: 1     Homeless in the Last Year: No       ALLERGIES:   Allergies   Allergen Reactions    Adhesive Rash    Ceclor [Cefaclor]  Other Adverse Reaction (Add comment)     Pt states a Ceclor pill gave her blisters in her mouth.        MEDICATIONS:   Current Outpatient Medications   Medication Instructions    atorvastatin (LIPITOR) 40 mg, Oral, EVERY EVENING    carvediloL (COREG) 12.5 mg, 2 TIMES DAILY    esomeprazole magnesium (NEXIUM) 40 mg, EVERY MORNING BEFORE BREAKFAST    ezetimibe (ZETIA) 10 mg, EVERY EVENING    famotidine (PEPCID) 40 mg, Oral, 2 TIMES DAILY  gabapentin (NEURONTIN) 100 mg, Oral, 3 TIMES DAILY    lidocaine-prilocaine (EMLA) 2.5-2.5 % Cream ONCE    magnesium oxide (MAG-OX) 400 mg, 2 TIMES DAILY    metFORMIN (GLUCOPHAGE XR) 500 mg, 2 TIMES DAILY     multivitamin with iron Oral Tablet 1 Tablet, DAILY    ondansetron (ZOFRAN ODT) 4 mg, Oral, EVERY 8 HOURS PRN    sertraline (ZOLOFT) 50 mg, DAILY    traMADoL (ULTRAM) 50 mg, Oral, EVERY 4 HOURS PRN    vit C/E/Zn/coppr/lutein/zeaxan (PRESERVISION AREDS-2 ORAL) 1 Tablet, 2 TIMES DAILY    Xarelto 20 mg, Oral, EVERY EVENING AFTER DINNER        REVIEW OF SYSTEMS  Pertinent review of systems as discussed in Interval History.      Objective:     Vitals:    05/20/23 0944   BP: (!) 152/81   Pulse: 69   Temp: 36 C (96.8 F)   Weight: 87.1 kg (192 lb)             PHYSICAL EXAMINATION  Physical Exam  Constitutional:       Appearance: Normal appearance.   HENT:      Head: Normocephalic.   Eyes:      Extraocular Movements: Extraocular movements intact.      Pupils: Pupils are equal, round, and reactive to light.   Pulmonary:      Effort: Pulmonary effort is normal.   Musculoskeletal:         General: Normal range of motion.   Skin:     General: Skin is warm and dry.   Neurological:      General: No focal deficit present.      Mental Status: She is alert and oriented to person, place, and time.   Psychiatric:         Mood and Affect: Mood normal.         Behavior: Behavior normal.          LABS/IMAGING: All relevant labs and imaging were reviewed as per HPI.      Molli Barrows, MD 05/20/2023, 09:56    ZO:[id number]@

## 2023-05-23 ENCOUNTER — Ambulatory Visit: Payer: Medicare Other

## 2023-05-29 ENCOUNTER — Encounter (INDEPENDENT_AMBULATORY_CARE_PROVIDER_SITE_OTHER): Payer: Self-pay | Admitting: HEMATOLOGY-ONCOLOGY

## 2023-05-30 ENCOUNTER — Encounter (HOSPITAL_COMMUNITY): Payer: Self-pay

## 2023-05-30 ENCOUNTER — Ambulatory Visit
Admission: RE | Admit: 2023-05-30 | Discharge: 2023-05-30 | Disposition: A | Discharge status: Home / Self Care | Payer: Medicare Other | Source: Ambulatory Visit | Attending: HEMATOLOGY-ONCOLOGY

## 2023-05-30 ENCOUNTER — Other Ambulatory Visit: Payer: Self-pay

## 2023-05-30 LAB — CBC WITH DIFF
BASOPHIL #: 0 10*3/uL (ref 0.00–0.10)
BASOPHIL %: 1 % (ref 0–1)
EOSINOPHIL #: 0.1 10*3/uL (ref 0.00–0.50)
EOSINOPHIL %: 2 % (ref 1–7)
HCT: 34.5 % (ref 31.2–41.9)
HGB: 11.3 g/dL (ref 10.9–14.3)
LYMPHOCYTE #: 1.4 10*3/uL (ref 1.10–3.10)
LYMPHOCYTE %: 40 % (ref 16–46)
MCH: 27.9 pg (ref 24.7–32.8)
MCHC: 32.7 g/dL (ref 32.3–35.6)
MCV: 85.3 fL (ref 75.5–95.3)
MONOCYTE #: 0.4 10*3/uL (ref 0.20–0.90)
MONOCYTE %: 12 % — ABNORMAL HIGH (ref 4–11)
MPV: 9 fL (ref 7.9–10.8)
NEUTROPHIL #: 1.6 10*3/uL — ABNORMAL LOW (ref 1.90–8.20)
NEUTROPHIL %: 46 % (ref 43–77)
PLATELETS: 126 10*3/uL — ABNORMAL LOW (ref 140–440)
RBC: 4.04 10*6/uL (ref 3.63–4.92)
RDW: 17.1 % (ref 12.3–17.7)
WBC: 3.5 10*3/uL — ABNORMAL LOW (ref 3.8–11.8)

## 2023-05-30 LAB — COMPREHENSIVE METABOLIC PANEL, NON-FASTING
ALBUMIN/GLOBULIN RATIO: 1.3 (ref 0.8–1.4)
ALBUMIN: 3.5 g/dL (ref 3.5–5.7)
ALKALINE PHOSPHATASE: 79 U/L (ref 34–104)
ALT (SGPT): 21 U/L (ref 7–52)
ANION GAP: 4 mmol/L (ref 4–13)
AST (SGOT): 30 U/L (ref 13–39)
BILIRUBIN TOTAL: 0.6 mg/dL (ref 0.3–1.0)
BUN/CREA RATIO: 21 (ref 6–22)
BUN: 23 mg/dL (ref 7–25)
CALCIUM, CORRECTED: 10.2 mg/dL (ref 8.9–10.8)
CALCIUM: 9.8 mg/dL (ref 8.6–10.3)
CHLORIDE: 106 mmol/L (ref 98–107)
CO2 TOTAL: 28 mmol/L (ref 21–31)
CREATININE: 1.08 mg/dL (ref 0.60–1.30)
ESTIMATED GFR: 52 mL/min/{1.73_m2} — ABNORMAL LOW (ref >59)
GLOBULIN: 2.8 (ref 2.0–3.5)
GLUCOSE: 108 mg/dL (ref 74–109)
OSMOLALITY, CALCULATED: 280 mosm/kg (ref 270–290)
POTASSIUM: 3.9 mmol/L (ref 3.5–5.1)
PROTEIN TOTAL: 6.3 g/dL — ABNORMAL LOW (ref 6.4–8.9)
SODIUM: 138 mmol/L (ref 136–145)

## 2023-05-30 LAB — MAGNESIUM: MAGNESIUM: 1.7 mg/dL — ABNORMAL LOW (ref 1.9–2.7)

## 2023-05-30 MED ORDER — MAGNESIUM SULFATE 1 GRAM/100 ML IN DEXTROSE 5 % INTRAVENOUS PIGGYBACK
1.0000 g | INJECTION | INTRAVENOUS | Status: AC
Start: 2023-05-30 — End: 2023-05-30
  Administered 2023-05-30: 0 g via INTRAVENOUS
  Administered 2023-05-30: 1 g via INTRAVENOUS
  Administered 2023-05-30: 0 g via INTRAVENOUS
  Administered 2023-05-30: 1 g via INTRAVENOUS
  Filled 2023-05-30: qty 100

## 2023-05-30 NOTE — Nurses Notes (Signed)
 1010-Patient here today for magnesium replacement.  To room by wheelchair with husband.  Patient has no complaints today.  Lung sounds are clear and normal heart sounds are present.  Vitals are stable.Salli Quarry, RN  1020-PAC accessed and flushed per protocol.  Blood return obtained.Salli Quarry, RN  1125-Magnesium infusion started (2 grams total).Salli Quarry, RN  1226-Magnesium infusion completed.Salli Quarry, RN  1330-PAC flushed and deaccessed.  Blood return obtained.Salli Quarry, RN  1333-Patient checking out.  Vitals are stable.Salli Quarry, RN

## 2023-06-03 ENCOUNTER — Encounter (INDEPENDENT_AMBULATORY_CARE_PROVIDER_SITE_OTHER): Payer: Self-pay | Admitting: HEMATOLOGY-ONCOLOGY

## 2023-06-04 ENCOUNTER — Other Ambulatory Visit (INDEPENDENT_AMBULATORY_CARE_PROVIDER_SITE_OTHER): Payer: Self-pay | Admitting: HEMATOLOGY-ONCOLOGY

## 2023-06-06 ENCOUNTER — Encounter (INDEPENDENT_AMBULATORY_CARE_PROVIDER_SITE_OTHER): Payer: Self-pay | Admitting: NURSE PRACTITIONER

## 2023-06-06 ENCOUNTER — Ambulatory Visit
Admission: RE | Admit: 2023-06-06 | Discharge: 2023-06-06 | Disposition: A | Discharge status: Home / Self Care | Payer: Self-pay | Source: Ambulatory Visit | Attending: NURSE PRACTITIONER | Admitting: NURSE PRACTITIONER

## 2023-06-06 ENCOUNTER — Ambulatory Visit (INDEPENDENT_AMBULATORY_CARE_PROVIDER_SITE_OTHER): Payer: Self-pay | Admitting: NURSE PRACTITIONER

## 2023-06-06 ENCOUNTER — Ambulatory Visit (INDEPENDENT_AMBULATORY_CARE_PROVIDER_SITE_OTHER)
Admission: RE | Admit: 2023-06-06 | Discharge: 2023-06-06 | Disposition: A | Discharge status: Home / Self Care | Payer: Self-pay | Source: Ambulatory Visit | Attending: NURSE PRACTITIONER | Admitting: NURSE PRACTITIONER

## 2023-06-06 ENCOUNTER — Other Ambulatory Visit: Payer: Self-pay

## 2023-06-06 VITALS — BP 155/80 | HR 65 | Temp 97.6°F | Resp 20

## 2023-06-06 VITALS — BP 134/69 | HR 68 | Temp 97.3°F | Ht 64.0 in | Wt 190.5 lb

## 2023-06-06 DIAGNOSIS — S32010D Wedge compression fracture of first lumbar vertebra, subsequent encounter for fracture with routine healing: Secondary | ICD-10-CM | POA: Insufficient documentation

## 2023-06-06 DIAGNOSIS — B029 Zoster without complications: Secondary | ICD-10-CM

## 2023-06-06 DIAGNOSIS — Z171 Estrogen receptor negative status [ER-]: Secondary | ICD-10-CM | POA: Insufficient documentation

## 2023-06-06 DIAGNOSIS — C50919 Malignant neoplasm of unspecified site of unspecified female breast: Secondary | ICD-10-CM

## 2023-06-06 DIAGNOSIS — Z5181 Encounter for therapeutic drug level monitoring: Secondary | ICD-10-CM

## 2023-06-06 DIAGNOSIS — Z79620 Long term (current) use of immunosuppressive biologic: Secondary | ICD-10-CM | POA: Insufficient documentation

## 2023-06-06 DIAGNOSIS — Z902 Acquired absence of lung [part of]: Secondary | ICD-10-CM | POA: Insufficient documentation

## 2023-06-06 DIAGNOSIS — Z79899 Other long term (current) drug therapy: Secondary | ICD-10-CM

## 2023-06-06 DIAGNOSIS — C50912 Malignant neoplasm of unspecified site of left female breast: Secondary | ICD-10-CM | POA: Insufficient documentation

## 2023-06-06 DIAGNOSIS — Z85118 Personal history of other malignant neoplasm of bronchus and lung: Secondary | ICD-10-CM | POA: Insufficient documentation

## 2023-06-06 DIAGNOSIS — Z5112 Encounter for antineoplastic immunotherapy: Secondary | ICD-10-CM | POA: Insufficient documentation

## 2023-06-06 DIAGNOSIS — Z8544 Personal history of malignant neoplasm of other female genital organs: Secondary | ICD-10-CM

## 2023-06-06 DIAGNOSIS — Z9221 Personal history of antineoplastic chemotherapy: Secondary | ICD-10-CM | POA: Insufficient documentation

## 2023-06-06 DIAGNOSIS — I82412 Acute embolism and thrombosis of left femoral vein: Secondary | ICD-10-CM | POA: Insufficient documentation

## 2023-06-06 DIAGNOSIS — R12 Heartburn: Secondary | ICD-10-CM | POA: Insufficient documentation

## 2023-06-06 DIAGNOSIS — Z1731 Human epidermal growth factor receptor 2 positive status: Secondary | ICD-10-CM | POA: Insufficient documentation

## 2023-06-06 DIAGNOSIS — Z8542 Personal history of malignant neoplasm of other parts of uterus: Secondary | ICD-10-CM | POA: Insufficient documentation

## 2023-06-06 DIAGNOSIS — W19XXXD Unspecified fall, subsequent encounter: Secondary | ICD-10-CM | POA: Insufficient documentation

## 2023-06-06 DIAGNOSIS — Z9889 Other specified postprocedural states: Secondary | ICD-10-CM | POA: Insufficient documentation

## 2023-06-06 LAB — CBC WITH DIFF
BASOPHIL #: 0 10*3/uL (ref 0.00–0.10)
BASOPHIL %: 1 % (ref 0–1)
EOSINOPHIL #: 0.1 10*3/uL (ref 0.00–0.50)
EOSINOPHIL %: 2 % (ref 1–7)
HCT: 34.4 % (ref 31.2–41.9)
HGB: 11.5 g/dL (ref 10.9–14.3)
LYMPHOCYTE #: 1.4 10*3/uL (ref 1.10–3.10)
LYMPHOCYTE %: 35 % (ref 16–46)
MCH: 28.5 pg (ref 24.7–32.8)
MCHC: 33.3 g/dL (ref 32.3–35.6)
MCV: 85.5 fL (ref 75.5–95.3)
MONOCYTE #: 0.3 10*3/uL (ref 0.20–0.90)
MONOCYTE %: 8 % (ref 4–11)
MPV: 8.9 fL (ref 7.9–10.8)
NEUTROPHIL #: 2.1 10*3/uL (ref 1.90–8.20)
NEUTROPHIL %: 54 % (ref 43–77)
PLATELETS: 153 10*3/uL (ref 140–440)
RBC: 4.02 10*6/uL (ref 3.63–4.92)
RDW: 16.7 % (ref 12.3–17.7)
WBC: 4 10*3/uL (ref 3.8–11.8)

## 2023-06-06 LAB — COMPREHENSIVE METABOLIC PANEL, NON-FASTING
ALBUMIN/GLOBULIN RATIO: 1.2 (ref 0.8–1.4)
ALBUMIN: 3.6 g/dL (ref 3.5–5.7)
ALKALINE PHOSPHATASE: 82 U/L (ref 34–104)
ALT (SGPT): 22 U/L (ref 7–52)
AST (SGOT): 28 U/L (ref 13–39)
BILIRUBIN TOTAL: 0.8 mg/dL (ref 0.3–1.0)
BUN/CREA RATIO: 15 (ref 6–22)
BUN: 13 mg/dL (ref 7–25)
CALCIUM, CORRECTED: 10.1 mg/dL (ref 8.9–10.8)
CALCIUM: 9.8 mg/dL (ref 8.6–10.3)
CHLORIDE: 108 mmol/L — ABNORMAL HIGH (ref 98–107)
CO2 TOTAL: 32 mmol/L — ABNORMAL HIGH (ref 21–31)
CREATININE: 0.89 mg/dL (ref 0.60–1.30)
ESTIMATED GFR: 65 mL/min/{1.73_m2} (ref >59)
GLOBULIN: 2.9 (ref 2.0–3.5)
GLUCOSE: 110 mg/dL — ABNORMAL HIGH (ref 74–109)
OSMOLALITY, CALCULATED: 280 mosm/kg (ref 270–290)
POTASSIUM: 4.6 mmol/L (ref 3.5–5.1)
PROTEIN TOTAL: 6.5 g/dL (ref 6.4–8.9)
SODIUM: 140 mmol/L (ref 136–145)

## 2023-06-06 LAB — MAGNESIUM: MAGNESIUM: 1.6 mg/dL — ABNORMAL LOW (ref 1.9–2.7)

## 2023-06-06 MED ORDER — MAGNESIUM SULFATE 1 GRAM/100 ML IN DEXTROSE 5 % INTRAVENOUS PIGGYBACK
1.0000 g | INJECTION | INTRAVENOUS | Status: AC
Start: 2023-06-06 — End: 2023-06-06
  Administered 2023-06-06 (×2): 0 g via INTRAVENOUS
  Administered 2023-06-06 (×2): 1 g via INTRAVENOUS
  Filled 2023-06-06 (×2): qty 100

## 2023-06-06 MED ORDER — TRASTUZUMAB-ANNS 420 MG INTRAVENOUS SOLUTION
6.0000 mg/kg | Freq: Once | INTRAVENOUS | Status: AC
Start: 2023-06-06 — End: 2023-06-06
  Administered 2023-06-06: 0 mg via INTRAVENOUS
  Administered 2023-06-06: 525 mg via INTRAVENOUS
  Filled 2023-06-06: qty 25

## 2023-06-06 NOTE — Nurses Notes (Signed)
 1029: Patient, daughter and husband to room 225. Has already seen provider and had labs drawn.Malva Search, RN  [phone]: Right port accessed per protocol.Malva Search, RN  1051- : A total of 2 gms macnesium given IV-each over 30 minutes per pharmacy.  For mag level 1.6 . Malva Search, RN  [phone]: 525 mg Kanjinti  given IV per orders. Malva Search, RN  [phone]: Infusions complete.  Port de-accessed per policy. Malva Search, RN  [phone]: Pt left via w/c with family. No complaints voiced other than continued back pain. Malva Search, RN

## 2023-06-06 NOTE — Cancer Center Note (Signed)
 Department of Hematology/Oncology  Progress Note   Name: Kelsey Preston  ZOX:[id number]  Date of Birth: 18-Jul-1940  Encounter Date: 06/06/2023    REFERRING PROVIDER:  Sherie Dine, MD  [address]  [place],  [place] [address]    REASON FOR OFFICE VISIT:  Breast Cancer     HISTORY OF PRESENT ILLNESS:  Kelsey Preston is a 83 y.o. female who presents today for follow up of breast cancer     The patient has a history of non-small-cell lung cancer, status post lobectomy in 2014.      She also has a history of endometrial cancer, and is status post surgical resection of that cancer as well.    More recently, the patient was found to have a left axillary lesion measuring 2.3 cm on PET-CT scan.  For some reason, I can not find an actual size measurement on the pathology report.  Multiple imaging studies of the breast were performed, but no primary lesion was found.  The malignant lesion was definitively classified as a lymph node.    Receptor staining showed that it was ER negative and HER2 Neu positive.  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    The patient was hospitalized from 7/22 to 10/09/22 while on treatment with paclitaxel  and trastuzumab  due to neutropenia and hypotension. She has also had electrolyte issues, with her magnesium  level being somewhat low.  Her Echo completed on 05/31/22 showed an Ejection Fraction of 60-65%. Her follow up Echo on 09/24/22 showed EF of 54% and Echo on 09/24/22 showed EF of 50-55%. Dr. Latisha Preston was notified of these results and he recommended holding Kinjinti and repeating Echo prior to her next dose of Kinjinti. She had a recent follow up echocardiogram completed on 01/23/23 which showed complete recovery.   In December 2024 she developed shingles on her left chest, around her left side and back. She completed Valacyclovir and was placed on Gabapentin  100 mg TID to help with the pain. She continues to have some pain, itching and burning in the affected area but otherwise recovering  well.      04/23/23:  Patient is here today for treatment with Kanjinti  for breast cancer.  She reports that she is doing well.  Her husband reports that he has noticed improvement in her.  She does still have weakness and uses a walker and wheelchair as needed to get around.  She has an echo scheduled for later today.    05/16/23:  Patient is here for follow up of breast cancer and treatment with Kanjinti .  She reports that she is having increased issues with heartburn.  This caused her throw up last night.  She denies any other new symptoms.  She is continued issues with pain from her shingles.        06/06/23:  Patient is here for follow up of breast cancer and treatment with Kanjinti .  She reports her heartburn has improved at this time.  She denies any other issues at this time.     ROS:   Pertinent review of systems as discussed in HPI    HISTORY:  Past Medical History:   Diagnosis Date    Diabetes mellitus, type 2 (CMS HCC)     Embolism (CMS HCC)     left leg    Endometrial cancer (CMS HCC)     HTN (hypertension)     Hx of breast cancer     Lung cancer (CMS HCC)     Macular  degeneration (senile) of retina      Past Surgical History:   Procedure Laterality Date    HIP SURGERY Left     HX CHOLECYSTECTOMY      HX COLONOSCOPY      HX HYSTERECTOMY      HX LOBECTOMY Left     LUNG CANCER SURGERY      PORTACATH PLACEMENT       Social History     Socioeconomic History    Marital status: Married     Spouse name: Not on file    Number of children: Not on file    Years of education: Not on file    Highest education level: Not on file   Occupational History    Not on file   Tobacco Use    Smoking status: Never    Smokeless tobacco: Never   Vaping Use    Vaping status: Never Used   Substance and Sexual Activity    Alcohol  use: Never    Drug use: Never    Sexual activity: Not Currently   Other Topics Concern    Not on file   Social History Narrative    Not on file     Social Determinants of Health     Financial Resource  Strain: Medium Risk (01/29/2023)    Received from [facility]    Overall Financial Resource Strain (CARDIA)     Difficulty of Paying Living Expenses: Somewhat hard   Transportation Needs: No Transportation Needs (01/29/2023)    Received from [facility] - Transportation     Lack of Transportation (Medical): No     Lack of Transportation (Non-Medical): No   Social Connections: Moderately Integrated (01/29/2023)    Received from [facility]    Social Connection and Isolation Panel [NHANES]     Frequency of Communication with Friends and Family: More than three times a week     Frequency of Social Gatherings with Friends and Family: More than three times a week     Attends Religious Services: More than 4 times per year     Active Member of [employer] or Organizations: No     Attends Banker Meetings: Never     Marital Status: Married   Catering manager Violence: Not At Risk (01/29/2023)    Received from [employer], Afraid, Rape, and Kick questionnaire     Fear of Current or Ex-Partner: No     Emotionally Abused: No     Physically Abused: No     Sexually Abused: No   Housing Stability: Low Risk  (01/29/2023)    Received from [facility] Stability Vital Sign     Unable to Pay for Housing in the Last Year: No     Number of Times Moved in the Last Year: 1     Homeless in the Last Year: No     Family Medical History:       Problem Relation (Age of Onset)    Cancer Brother            Current Outpatient Medications   Medication Sig    atorvastatin  (LIPITOR) 40 mg Oral Tablet Take 1 Tablet (40 mg total) by mouth Every evening for 30 days    carvediloL  (COREG ) 12.5 mg Oral Tablet Take 1 Tablet (12.5 mg total) by mouth Twice daily    esomeprazole magnesium  (NEXIUM) 40 mg Oral Capsule, Delayed Release(E.C.) Take 1 Capsule (  40 mg total) by mouth Every morning before breakfast    ezetimibe  (ZETIA ) 10 mg Oral Tablet Take 1 Tablet (10 mg total) by mouth Every evening     famotidine  (PEPCID ) 40 mg Oral Tablet Take 1 Tablet (40 mg total) by mouth Twice daily    gabapentin  (NEURONTIN ) 100 mg Oral Capsule Take 1 Capsule (100 mg total) by mouth Three times a day    lidocaine -prilocaine  (EMLA ) 2.5-2.5 % Cream One time    magnesium  oxide (MAG-OX) 400 mg Oral Tablet Take 1 Tablet (400 mg total) by mouth Twice daily    metFORMIN (GLUCOPHAGE XR) 500 mg Oral Tablet Sustained Release 24 hr Take 1 Tablet (500 mg total) by mouth Twice daily    multivitamin with iron Oral Tablet Take 1 Tablet by mouth Daily    ondansetron  (ZOFRAN  ODT) 4 mg Oral Tablet, Rapid Dissolve Take 1 Tablet (4 mg total) by mouth Every 8 hours as needed for Nausea/Vomiting Indications: prevent nausea and vomiting from cancer chemotherapy    sertraline  (ZOLOFT ) 50 mg Oral Tablet Take 1 Tablet (50 mg total) by mouth Daily    traMADoL  (ULTRAM ) 50 mg Oral Tablet Take 1 Tablet (50 mg total) by mouth Every 4 hours as needed for Pain    vit C/E/Zn/coppr/lutein/zeaxan (PRESERVISION AREDS-2 ORAL) Take 1 Tablet by mouth Twice daily    XARELTO  20 mg Oral Tablet TAKE 1 TABLET BY MOUTH EVERY EVENING WITH DINNER     Allergies   Allergen Reactions    Adhesive Rash    Ceclor [Cefaclor]  Other Adverse Reaction (Add comment)     Pt states a Ceclor pill gave her blisters in her mouth.       PHYSICAL EXAM:  BP 134/69 (Site: Left Arm, Patient Position: Sitting, Cuff Size: Adult)   Pulse 68   Temp 36.3 C (97.3 F) (Temporal)   Ht 1.626 m (5\' 4" )   Wt 86.4 kg (190 lb 8 oz)   SpO2 100%   BMI 32.70 kg/m        ECOG Status: (1) Restricted in physically strenuous activity, ambulatory and able to do work of light nature   Physical Exam  Vitals and nursing note reviewed.   Constitutional:       Appearance: Normal appearance.   HENT:      Head: Normocephalic.      Nose: Nose normal.      Mouth/Throat:      Mouth: Mucous membranes are moist.      Pharynx: Oropharynx is clear.   Eyes:      General: No scleral icterus.     Extraocular Movements:  Extraocular movements intact.   Cardiovascular:      Rate and Rhythm: Normal rate and regular rhythm.      Pulses: Normal pulses.      Heart sounds: Normal heart sounds.   Pulmonary:      Effort: Pulmonary effort is normal.      Breath sounds: Normal breath sounds.   Abdominal:      General: Bowel sounds are normal.      Palpations: Abdomen is soft.   Musculoskeletal:         General: Normal range of motion.      Cervical back: Normal range of motion and neck supple.   Skin:     General: Skin is warm and dry.   Neurological:      General: No focal deficit present.      Mental Status: She is alert and  oriented to person, place, and time. Mental status is at baseline.   Psychiatric:         Mood and Affect: Mood normal.         Behavior: Behavior normal.         Thought Content: Thought content normal.         Judgment: Judgment normal.       ECHO:  Results for orders placed during the hospital encounter of 04/25/23    TRANSTHORACIC ECHOCARDIOGRAM - ADULT    Narrative  **See full report in linked PDF document**  [facility]  [address] [place], [place] [address]    Transthoracic Echocardiographic Report    ______________________________________________________________________________  Name: Kelsey Preston, Kelsey Preston                                     MRN: [id number]               Weight: 206 lb  Study Date: 04/25/2023 01:38 PM                         DOB: 1941/03/03             Height: 64 in  Gender: Female                                          Age: 80 yrs                 BSA: 2.0 m2  Accession #: [id number]                              BP: 150/77 mmHg  Patient Location: PRN NON INVASIVE CARD PRN  Ordering Provider: Marzette Solders  Tech: Terrie Fetter    ______________________________________________________________________________  Procedure:  Transthoracic complete echo with contrast, 2D, spectral and tissue Doppler, color flow Doppler, M-mode.    Quality:  The study images were of technically adequate  quality.    Indications: Encounter for monitoring cardiotoxic drug therapy,Encounter for monitoring cardiotoxic drug therapy    Conclusions:  Normal left ventricular size and systolic function; concentric remodeling.  No regional wall motion abnormlities.  Left ventricular EF: 65%.  Normal diastolic function.  Normal right ventricular size and systolic function.  Normal RA pressure (3 mmHg).  No pulmonary hypertension (RVSP ).  Normal aortic root and ascending aorta size.  No pericardial effusion.  No significant valvular heart disease. No aortic stenosis.  The ascending aorta is mildly dilated.  No acute changes are noted since prior examination of November 2024.    Findings  Left Ventricle:   Normal left ventricular size. Normal geometry. Left ventricular systolic function is normal. The left ventricular ejection fraction by visual assessment is estimated to be 65%. No  segmental/regional wall motion abnormalities identified. Left ventricular diastolic function could not be assessed due to the presence of AFib.  Right Ventricle:   Normal right ventricular size. Normal right ventricular systolic function. Right ventricular systolic pressure is normal.  Left Atrium:   Severely dilated left atrium.  Right Atrium:   The right atrium is of normal size.  Mitral Valve:   The mitral valve is normal. Trace mitral regurgitation present.  Tricuspid Valve:   The tricuspid  valve is normal.  Aortic Valve:   The aortic valve is mildly calcified.  Pulmonic Valve:   The pulmonic valve is normal.  Atrial Septum:   The interatrial septum is normal in appearance.  IVC/Hepatic Veins:   Normal IVC size with >50% inspiratory collapse (estimated RA pressure 3 mmHg).  Aorta:   The aortic root is of normal size. The ascending aorta is mildly dilated.  Pericardium/Pleural space:   Normal pericardium with no pericardial effusion.    Electronically signed by: M.D. Huey Madrid on 04/25/2023 03:58 PM        DIAGNOSTIC DATA:  Results  for orders placed during the hospital encounter of 01/23/23    PET NG:EXBM (HEAD TO THIGH) WO IV CONTRAST    Narrative  Deborra Fritsche    RADIOLOGIST: Baxter Limber    PET CT SUBSEQUENT: BODY (HEAD TO THIGH) WO IV CONTRAST performed on 01/23/2023 1:38 PM    CLINICAL HISTORY: C50.919: Breast cancer (CMS HCC).  BREAST CANCER/CURRENTLY UNDER GOING TREATMENT FOR LEFT BREAST CANCER/HX OF ENDOMETRIAL AND LUNG CA EXCISIONAL BIOPSY LEFT AXILLA 08/07/2022.    Subsequent Treatment Strategy.    TECHNIQUE:  F-18 fluorodeoxyglucose intravenously with PET-CT from the skull base to the proximal thighs  Uptake Time: 60 minutes  Serum Glucose: 111 mg/dL at the time of injection  SUV Measurement Type: SUVmax  SUV Normalization Method: Body weight    RADIOPHARMACEUTICAL: 12.4 millicuries F-18 FDG    COMPARISON: PET/CT from 07/04/2022. CT abdomen and pelvis from 01/05/2023.    FINDINGS:  Reference Background Values:  Mediastinal Blood Pool: 2.47 SUV max  Volumetric Normal Liver: 3.39 SUV max    HEAD AND NECK: No suspicious focal hypermetabolic activity. No hypermetabolic cervical or supraclavicular lymph nodes. No hypermetabolic parotid gland lesion is seen. No suspicious hypermetabolic activity about the pharynx.    CHEST: The previously seen hypermetabolic left axillary lesion-lymph node on PET/CT from 07/04/2022 is no longer identified. There are no current hypermetabolic mediastinal, hilar, internal mammary or axillary lymph nodes identified. No hypermetabolic lung lesion. No pleural effusion.    ABDOMEN AND PELVIS: No suspicious focal hypermetabolic activity is identified. No ascites. No hydronephrosis. Genitourinary activity appears physiologic. Left adrenal gland nodularity is unchanged. Stable borderline enlargement of the spleen. No convincing focal hypermetabolic activity involving the stomach to confirm gastritis or focal active inflammation.    The majority of the colon is again seen to be partially decompressed similar to CT  from 01/05/2023 with some liquid stool again suggested within the colon which may be related to diarrheal state. No suspicious focal increased hypermetabolic activity within the colon is seen. Generalized metabolic activity throughout the colon as well as rectum is present and may be physiologic. Please correlate with clinical history for possible degree of generalized nonspecific enteritis-colitis with possible diarrhea. These findings are also suggested on the prior CT from 01/05/2023.    MUSCULOSKELETAL: Continued slight increase concavity of the L1 vertebral body inferior endplate is suggested on CT localizer images compatible with a compression fracture without bony retropulsion when compared to 01/05/2023. Some relatively low level metabolic activity essentially paralleling the L1 vertebral body inferior endplate is present without discrete bone lesion seen on CT localizer images.    No osseous destructive lesion is seen. Postsurgical hardware about the proximal left femur again partially visualized. Discogenic and degenerative arthritic changes of the spine again seen at multiple levels. No retroperitoneal hematoma. Psoas musculature is grossly symmetric.    Impression  1. L1 VERTEBRAL BODY MILD COMPRESSION  FRACTURE DEFORMITY WITH CONCAVITY OF THE INFERIOR ENDPLATE WHICH MAY BE SLIGHTLY MORE PRONOUNCED WHEN COMPARED TO February 02, 2023. SOME METABOLIC ACTIVITY WITHIN THE VERTEBRAL BODY ALONG THE INFERIOR ENDPLATE IS PRESENT AND COULD BE RELATED TO RECENT FRACTURE. PLEASE CONSIDER MRI PREFERABLY WITHOUT AND WITH IV CONTRAST FOR FURTHER AND MORE DEFINITIVE EVALUATION OF THE LUMBAR SPINE PARTICULARLY THE L1 LEVEL AND HELP COMPLETELY EXCLUDE THE POSSIBILITY OF A PATHOLOGIC FRACTURE.    2. NO CURRENT SUSPICIOUS FOCAL HYPERMETABOLIC ACTIVITY INVOLVING THE SOFT TISSUES OF THE NECK, CHEST, ABDOMEN OR PELVIS TO SUGGEST MALIGNANCY-METASTASIS WHEN COMPARED TO 07/04/2022.    3. PREVIOUSLY SEEN HYPERMETABOLIC LEFT AXILLARY  LESION-LYMPH NODE ON 07/04/2022 IS NO LONGER PRESENT.    4. PERSISTENT IMAGING FINDINGS SUGGESTING A POSSIBLE DEGREE OF NONSPECIFIC ENTERITIS -COLITIS WHEN COMPARED TO CT FROM 2023-02-02. PLEASE CORRELATE WITH CLINICAL FINDINGS AND PATIENT HISTORY.        Radiologist location ID: [id number]         LABS:   CBC  Diff   Lab Results   Component Value Date/Time    WBC 4.0 06/06/2023 09:10 AM    HGB 11.5 06/06/2023 09:10 AM    HCT 34.4 06/06/2023 09:10 AM    PLTCNT 153 06/06/2023 09:10 AM    RBC 4.02 06/06/2023 09:10 AM    MCV 85.5 06/06/2023 09:10 AM    MCHC 33.3 06/06/2023 09:10 AM    MCH 28.5 06/06/2023 09:10 AM    RDW 16.7 06/06/2023 09:10 AM    MPV 8.9 06/06/2023 09:10 AM    Lab Results   Component Value Date/Time    PMNS 54 06/06/2023 09:10 AM    LYMPHOCYTES 35 06/06/2023 09:10 AM    EOSINOPHIL 2 06/06/2023 09:10 AM    MONOCYTES 8 06/06/2023 09:10 AM    BASOPHILS 1 06/06/2023 09:10 AM    BASOPHILS 0.00 06/06/2023 09:10 AM    PMNABS 2.10 06/06/2023 09:10 AM    LYMPHSABS 1.40 06/06/2023 09:10 AM    EOSABS 0.10 06/06/2023 09:10 AM    MONOSABS 0.30 06/06/2023 09:10 AM    BASABS 0.04 05/30/2022 05:40 AM            Comprehensive Metabolic Profile    Lab Results   Component Value Date    SODIUM 140 06/06/2023    POTASSIUM 4.6 06/06/2023    CHLORIDE 108 (H) 06/06/2023    CO2 32 (H) 06/06/2023    ANIONGAP 4 05/30/2023    BUN 13 06/06/2023    CREATININE 0.89 06/06/2023    ALBUMIN 3.6 06/06/2023    CALCIUM 9.8 06/06/2023    GLUCOSENF 110 (H) 06/06/2023    ALKPHOS 82 06/06/2023    ALT 22 06/06/2023    AST 28 06/06/2023    TOTBILIRUBIN 0.8 06/06/2023    TOTALPROTEIN 6.5 06/06/2023         ESTIMATED GFR   Date Value Ref Range Status   06/06/2023 65 >59 mL/min/1.38m^2 Final     ASSESSMENT:      ICD-10-CM    1. Malignant neoplasm of left breast in female, estrogen receptor negative, unspecified site of breast (CMS HCC)  C50.912     Z17.1       2. Encounter for monitoring cardiotoxic drug therapy  Z51.81     Z79.899                     PLAN:   1. Breast cancer:    Status post resection of an involved lymph node that was 2.3 cm on PET/CT.  No primary lesion was seen on imaging studies.  T0 N1 M0, stage IIA.  ER negative, HER2 Neu positive.  After resection of the node, she was rendered cancer-free radiographically.  She is on treatment with adjuvant paclitaxel  and trastuzumab .  We had previously discussed the options of either no additional surgery and taking our chances versus radiation of the breast versus mastectomy.  Radiation to the axillary area will likely be required due to the lymph node that was known to be involved.  Patient has seen Dr. Asher Lawn and according to his note he would favor radiation if her Signatera was positive, however her Signatera was negative on 02/18/2023.    Her cardiac function is acceptable, and she will continue trastuzumab  adjuvant therapy. Her next ECHO is due around 08/13/23  2. History of lung cancer:    Continue surveillance.    3. History of gynecologic cancer:   Continue surveillance as well.  4. Compression fracture:   The patient had a recent compression fracture after suffering a fall.  I do not have any reason to suspect that it was pathologic in nature.  We discussed use of Prolia  in order to prevent future skeletal related events.  She received her 1st dose of Prolia  on 02/12/23 and is scheduled for her next dose 08/20/23.    5. Shingles:   resolving, post Acyclovir and currently on Gabapentin .   6. Heartburn:   Improved with increase in Pepcid      Mamye Bolds was given the chance to ask questions, and these were answered to their satisfaction. The patient is welcome to call with any questions or concerns in the meantime.     On the day of the encounter, a total of 20 minutes was spent on this patient encounter including review of historical information, examination, documentation and post-visit activities.   Return in about 6 weeks (around 07/18/2023).   Angela Kell APRN, FNP-BC, AOCNP, 06/06/2023 ,  10:24   You can see your note(s) in [facility]. It is common for you to encounter certain medical terminology which may be unfamiliar to you. You might see results before your provider does so please give at least 2 business days for review. Please have this understanding, that NOT all abnormal results are significant. Our office will contact you for any urgent or emergent action if necessary. If you have any questions or concerns, feel free to send a MyChart message or call the office. Please call with any new or concerning symptoms.   The patient's insurance company bears full legal and financial responsibility resulting from any deviations that they cause to my recommended treatment plan.   CC:  Kelsey Dine, MD  [address]  [place] [address]    Kelsey Dine, MD  [address]  [place],  [place] [address]    This note was partially generated using MModal Fluency Direct system, and there may be some incorrect words, spellings, and punctuation that were not noted in checking the note before saving.

## 2023-06-12 ENCOUNTER — Encounter (INDEPENDENT_AMBULATORY_CARE_PROVIDER_SITE_OTHER): Payer: Self-pay | Admitting: HEMATOLOGY-ONCOLOGY

## 2023-06-13 ENCOUNTER — Ambulatory Visit (HOSPITAL_COMMUNITY): Payer: Medicare Other

## 2023-06-20 ENCOUNTER — Ambulatory Visit (HOSPITAL_COMMUNITY)

## 2023-06-24 ENCOUNTER — Encounter (INDEPENDENT_AMBULATORY_CARE_PROVIDER_SITE_OTHER): Payer: Self-pay | Admitting: HEMATOLOGY-ONCOLOGY

## 2023-06-25 ENCOUNTER — Encounter (INDEPENDENT_AMBULATORY_CARE_PROVIDER_SITE_OTHER): Payer: Self-pay | Admitting: HEMATOLOGY-ONCOLOGY

## 2023-06-27 ENCOUNTER — Ambulatory Visit
Admission: RE | Admit: 2023-06-27 | Discharge: 2023-06-27 | Disposition: A | Discharge status: Home / Self Care | Source: Ambulatory Visit | Attending: PHYSICIAN/UNDEFINED PHYSICIAN TYPE | Admitting: PHYSICIAN/UNDEFINED PHYSICIAN TYPE

## 2023-06-27 ENCOUNTER — Other Ambulatory Visit (INDEPENDENT_AMBULATORY_CARE_PROVIDER_SITE_OTHER): Payer: Self-pay | Admitting: HEMATOLOGY-ONCOLOGY

## 2023-06-27 ENCOUNTER — Other Ambulatory Visit: Payer: Self-pay

## 2023-06-27 ENCOUNTER — Encounter (HOSPITAL_COMMUNITY): Payer: Self-pay

## 2023-06-27 VITALS — BP 151/81 | HR 79 | Temp 97.3°F | Resp 20 | Ht 64.0 in | Wt 189.0 lb

## 2023-06-27 DIAGNOSIS — Z5112 Encounter for antineoplastic immunotherapy: Secondary | ICD-10-CM | POA: Insufficient documentation

## 2023-06-27 DIAGNOSIS — C50919 Malignant neoplasm of unspecified site of unspecified female breast: Secondary | ICD-10-CM | POA: Insufficient documentation

## 2023-06-27 DIAGNOSIS — I82412 Acute embolism and thrombosis of left femoral vein: Secondary | ICD-10-CM | POA: Insufficient documentation

## 2023-06-27 LAB — CBC WITH DIFF
BASOPHIL #: 0 10*3/uL (ref 0.00–0.10)
BASOPHIL %: 1 % (ref 0–1)
EOSINOPHIL #: 0.1 10*3/uL (ref 0.00–0.50)
EOSINOPHIL %: 2 % (ref 1–7)
HCT: 34.9 % (ref 31.2–41.9)
HGB: 11.6 g/dL (ref 10.9–14.3)
LYMPHOCYTE #: 1.5 10*3/uL (ref 1.10–3.10)
LYMPHOCYTE %: 36 % (ref 16–46)
MCH: 29.2 pg (ref 24.7–32.8)
MCHC: 33.3 g/dL (ref 32.3–35.6)
MCV: 87.7 fL (ref 75.5–95.3)
MONOCYTE #: 0.4 10*3/uL (ref 0.20–0.90)
MONOCYTE %: 9 % (ref 4–11)
MPV: 9.1 fL (ref 7.9–10.8)
NEUTROPHIL #: 2.1 10*3/uL (ref 1.90–8.20)
NEUTROPHIL %: 52 % (ref 43–77)
PLATELETS: 137 10*3/uL — ABNORMAL LOW (ref 140–440)
RBC: 3.98 10*6/uL (ref 3.63–4.92)
RDW: 17.1 % (ref 12.3–17.7)
WBC: 4.1 10*3/uL (ref 3.8–11.8)

## 2023-06-27 LAB — COMPREHENSIVE METABOLIC PANEL, NON-FASTING
ALBUMIN/GLOBULIN RATIO: 1.3 (ref 0.8–1.4)
ALBUMIN: 3.7 g/dL (ref 3.5–5.7)
ALKALINE PHOSPHATASE: 76 U/L (ref 34–104)
ALT (SGPT): 21 U/L (ref 7–52)
ANION GAP: 5 mmol/L (ref 4–13)
AST (SGOT): 29 U/L (ref 13–39)
BILIRUBIN TOTAL: 0.7 mg/dL (ref 0.3–1.0)
BUN/CREA RATIO: 15 (ref 6–22)
BUN: 16 mg/dL (ref 7–25)
CALCIUM, CORRECTED: 10.6 mg/dL (ref 8.9–10.8)
CALCIUM: 10.4 mg/dL — ABNORMAL HIGH (ref 8.6–10.3)
CHLORIDE: 106 mmol/L (ref 98–107)
CO2 TOTAL: 29 mmol/L (ref 21–31)
CREATININE: 1.08 mg/dL (ref 0.60–1.30)
ESTIMATED GFR: 52 mL/min/{1.73_m2} — ABNORMAL LOW (ref >59)
GLOBULIN: 2.8 (ref 2.0–3.5)
GLUCOSE: 94 mg/dL (ref 74–109)
OSMOLALITY, CALCULATED: 280 mosm/kg (ref 270–290)
POTASSIUM: 3.9 mmol/L (ref 3.5–5.1)
PROTEIN TOTAL: 6.5 g/dL (ref 6.4–8.9)
SODIUM: 140 mmol/L (ref 136–145)

## 2023-06-27 LAB — MAGNESIUM: MAGNESIUM: 1.6 mg/dL — ABNORMAL LOW (ref 1.9–2.7)

## 2023-06-27 MED ORDER — EPINEPHRINE 1 MG/ML (1 ML) INJECTION SOLUTION
0.3000 mg | Freq: Once | INTRAMUSCULAR | Status: DC | PRN
Start: 2023-06-27 — End: 2023-06-28

## 2023-06-27 MED ORDER — FAMOTIDINE (PF) 20 MG/2 ML INTRAVENOUS SOLUTION
20.0000 mg | Freq: Once | INTRAVENOUS | Status: DC | PRN
Start: 2023-06-27 — End: 2023-06-28

## 2023-06-27 MED ORDER — DEXTROSE 5% IN WATER (D5W) FLUSH BAG - 250 ML
INTRAVENOUS | Status: DC | PRN
Start: 2023-06-27 — End: 2023-06-28

## 2023-06-27 MED ORDER — MAGNESIUM SULFATE 1 GRAM/100 ML IN DEXTROSE 5 % INTRAVENOUS PIGGYBACK
1.0000 g | INJECTION | INTRAVENOUS | Status: AC
Start: 2023-06-27 — End: 2023-06-27
  Administered 2023-06-27: 1 g via INTRAVENOUS
  Administered 2023-06-27 (×2): 0 g via INTRAVENOUS
  Administered 2023-06-27: 1 g via INTRAVENOUS
  Filled 2023-06-27 (×2): qty 100

## 2023-06-27 MED ORDER — DIPHENHYDRAMINE 50 MG/ML INJECTION SOLUTION
25.0000 mg | Freq: Once | INTRAMUSCULAR | Status: DC | PRN
Start: 2023-06-27 — End: 2023-06-28

## 2023-06-27 MED ORDER — MEPERIDINE (PF) 25 MG/ML INJECTION SOLUTION
12.5000 mg | Freq: Once | INTRAMUSCULAR | Status: DC | PRN
Start: 2023-06-27 — End: 2023-06-28

## 2023-06-27 MED ORDER — SODIUM CHLORIDE 0.9% FLUSH BAG - 250 ML
INTRAVENOUS | Status: DC | PRN
Start: 2023-06-27 — End: 2023-06-28

## 2023-06-27 MED ORDER — ALBUTEROL SULFATE 2.5 MG/3 ML (0.083 %) SOLUTION FOR NEBULIZATION
2.5000 mg | INHALATION_SOLUTION | Freq: Once | RESPIRATORY_TRACT | Status: DC | PRN
Start: 2023-06-27 — End: 2023-06-28

## 2023-06-27 MED ORDER — DIPHENHYDRAMINE 50 MG/ML INJECTION SOLUTION
50.0000 mg | Freq: Once | INTRAMUSCULAR | Status: DC | PRN
Start: 2023-06-27 — End: 2023-06-28

## 2023-06-27 MED ORDER — TRASTUZUMAB-ANNS 420 MG INTRAVENOUS SOLUTION
6.0000 mg/kg | Freq: Once | INTRAVENOUS | Status: AC
Start: 2023-06-27 — End: 2023-06-27
  Administered 2023-06-27: 0 mg via INTRAVENOUS
  Administered 2023-06-27: 525 mg via INTRAVENOUS
  Filled 2023-06-27: qty 25

## 2023-06-27 MED ORDER — HYDROCORTISONE SOD SUCCINATE 100 MG/2 ML VIAL WRAPPER
100.0000 mg | Freq: Once | INTRAMUSCULAR | Status: DC | PRN
Start: 2023-06-27 — End: 2023-06-28

## 2023-06-27 MED ORDER — ALBUTEROL SULFATE HFA 90 MCG/ACTUATION AEROSOL INHALER - RN
2.0000 | Freq: Once | RESPIRATORY_TRACT | Status: DC | PRN
Start: 2023-06-27 — End: 2023-06-28

## 2023-06-27 NOTE — Nurses Notes (Addendum)
 0917-Arrived to OP ONC in wheelchair and accompanied by daughter.  Here for labs, Kanjinti infusion, and possible Magnesium replacement.  To room 226. Audree Bless, RN  0920-Seated in vascular chair with wheels locked. Call light within reach. Spouse in room. Skin warm and dry.  No voiced complaints of pain or discomfort. HRR.  Lungs clear.  Non-pitting edema noted to bilateral lower extremities. Appetite fair.  Per pt, food still does not taste right.   Continues to have numbness and tingling in fingertips and right foot.  No other voiced complaints offered.  Audree Bless, RN  [phone] obtained via venipuncture by Lenon Radar, MLT at this time.  Labs sent to main  1006-Magnesium 1.6.  Will receive Magnesium Sulfate 2 grams IV per protocol. Audree Bless, RN  1035-Right upper chest PortACath accessed per protocol using sterile technique.  Excellent blood return and flush noted.  Transparent, non-bordered dressing applied.  Tolerated well.   Audree Bless, RN  1036-Magnesium Sulfate 2 grams IV infusion started.  Audree Bless, RN  1137-Magnesium Sulfate infusion complete.  Tolerated well. Audree Bless, RN  1145-Kanjinti 525mg  IV infusion started. Earlena Werst, RN  1215-Kanjinti infusion completed at this time.   Tolerated well.  No voiced complaints offered. Audree Bless, RN  1227-PortACath deaccessed per protocol.  Adhesive bandage with gauze applied.  Tolerated well. Audree Bless, RN  1234-Left OP ONC in wheelchair and accompanied by spouse.  No acute distress noted.  VSS. Audree Bless, RN

## 2023-06-29 ENCOUNTER — Other Ambulatory Visit (INDEPENDENT_AMBULATORY_CARE_PROVIDER_SITE_OTHER): Payer: Self-pay | Admitting: NURSE PRACTITIONER

## 2023-07-01 ENCOUNTER — Encounter (INDEPENDENT_AMBULATORY_CARE_PROVIDER_SITE_OTHER): Payer: Self-pay | Admitting: HEMATOLOGY-ONCOLOGY

## 2023-07-04 ENCOUNTER — Ambulatory Visit (HOSPITAL_COMMUNITY)

## 2023-07-08 ENCOUNTER — Encounter (INDEPENDENT_AMBULATORY_CARE_PROVIDER_SITE_OTHER): Payer: Self-pay | Admitting: HEMATOLOGY-ONCOLOGY

## 2023-07-15 ENCOUNTER — Other Ambulatory Visit (INDEPENDENT_AMBULATORY_CARE_PROVIDER_SITE_OTHER): Payer: Self-pay | Admitting: HEMATOLOGY-ONCOLOGY

## 2023-07-18 ENCOUNTER — Ambulatory Visit (INDEPENDENT_AMBULATORY_CARE_PROVIDER_SITE_OTHER)
Admission: RE | Admit: 2023-07-18 | Discharge: 2023-07-18 | Disposition: A | Discharge status: Home / Self Care | Payer: Self-pay | Source: Ambulatory Visit | Attending: NURSE PRACTITIONER | Admitting: NURSE PRACTITIONER

## 2023-07-18 ENCOUNTER — Ambulatory Visit
Admission: RE | Admit: 2023-07-18 | Discharge: 2023-07-18 | Disposition: A | Discharge status: Home / Self Care | Source: Ambulatory Visit | Attending: HEMATOLOGY-ONCOLOGY | Admitting: HEMATOLOGY-ONCOLOGY

## 2023-07-18 ENCOUNTER — Encounter (INDEPENDENT_AMBULATORY_CARE_PROVIDER_SITE_OTHER): Payer: Self-pay | Admitting: NURSE PRACTITIONER

## 2023-07-18 ENCOUNTER — Ambulatory Visit (INDEPENDENT_AMBULATORY_CARE_PROVIDER_SITE_OTHER): Payer: Self-pay | Admitting: NURSE PRACTITIONER

## 2023-07-18 ENCOUNTER — Other Ambulatory Visit: Payer: Self-pay

## 2023-07-18 ENCOUNTER — Encounter (HOSPITAL_COMMUNITY): Payer: Self-pay

## 2023-07-18 VITALS — BP 133/74 | HR 63 | Temp 97.9°F | Ht 64.0 in | Wt 191.4 lb

## 2023-07-18 DIAGNOSIS — Z8619 Personal history of other infectious and parasitic diseases: Secondary | ICD-10-CM | POA: Insufficient documentation

## 2023-07-18 DIAGNOSIS — Z79624 Long term (current) use of inhibitors of nucleotide synthesis: Secondary | ICD-10-CM | POA: Insufficient documentation

## 2023-07-18 DIAGNOSIS — Z5181 Encounter for therapeutic drug level monitoring: Secondary | ICD-10-CM | POA: Insufficient documentation

## 2023-07-18 DIAGNOSIS — M810 Age-related osteoporosis without current pathological fracture: Secondary | ICD-10-CM

## 2023-07-18 DIAGNOSIS — Z5112 Encounter for antineoplastic immunotherapy: Secondary | ICD-10-CM | POA: Insufficient documentation

## 2023-07-18 DIAGNOSIS — Z79899 Other long term (current) drug therapy: Secondary | ICD-10-CM | POA: Insufficient documentation

## 2023-07-18 DIAGNOSIS — Z79620 Long term (current) use of immunosuppressive biologic: Secondary | ICD-10-CM | POA: Insufficient documentation

## 2023-07-18 DIAGNOSIS — Z8542 Personal history of malignant neoplasm of other parts of uterus: Secondary | ICD-10-CM | POA: Insufficient documentation

## 2023-07-18 DIAGNOSIS — I82412 Acute embolism and thrombosis of left femoral vein: Secondary | ICD-10-CM

## 2023-07-18 DIAGNOSIS — C50912 Malignant neoplasm of unspecified site of left female breast: Secondary | ICD-10-CM

## 2023-07-18 DIAGNOSIS — Z17420 Hormone receptor negative with human epidermal growth factor receptor 2 positive status: Secondary | ICD-10-CM | POA: Insufficient documentation

## 2023-07-18 DIAGNOSIS — S32019D Unspecified fracture of first lumbar vertebra, subsequent encounter for fracture with routine healing: Secondary | ICD-10-CM | POA: Insufficient documentation

## 2023-07-18 DIAGNOSIS — Z85118 Personal history of other malignant neoplasm of bronchus and lung: Secondary | ICD-10-CM | POA: Insufficient documentation

## 2023-07-18 DIAGNOSIS — C50919 Malignant neoplasm of unspecified site of unspecified female breast: Secondary | ICD-10-CM

## 2023-07-18 DIAGNOSIS — Z79633 Long term (current) use of mitotic inhibitor: Secondary | ICD-10-CM | POA: Insufficient documentation

## 2023-07-18 DIAGNOSIS — R12 Heartburn: Secondary | ICD-10-CM | POA: Insufficient documentation

## 2023-07-18 DIAGNOSIS — Z902 Acquired absence of lung [part of]: Secondary | ICD-10-CM | POA: Insufficient documentation

## 2023-07-18 DIAGNOSIS — R197 Diarrhea, unspecified: Secondary | ICD-10-CM | POA: Insufficient documentation

## 2023-07-18 DIAGNOSIS — Z171 Estrogen receptor negative status [ER-]: Secondary | ICD-10-CM

## 2023-07-18 LAB — CBC WITH DIFF
BASOPHIL #: 0 10*3/uL (ref 0.00–0.10)
BASOPHIL %: 1 % (ref 0–1)
EOSINOPHIL #: 0.1 10*3/uL (ref 0.00–0.50)
EOSINOPHIL %: 2 % (ref 1–7)
HCT: 33.7 % (ref 31.2–41.9)
HGB: 11.2 g/dL (ref 10.9–14.3)
LYMPHOCYTE #: 1.3 10*3/uL (ref 1.10–3.10)
LYMPHOCYTE %: 30 % (ref 16–46)
MCH: 29.2 pg (ref 24.7–32.8)
MCHC: 33.3 g/dL (ref 32.3–35.6)
MCV: 87.8 fL (ref 75.5–95.3)
MONOCYTE #: 0.4 10*3/uL (ref 0.20–0.90)
MONOCYTE %: 9 % (ref 4–11)
MPV: 8.8 fL (ref 7.9–10.8)
NEUTROPHIL #: 2.5 10*3/uL (ref 1.90–8.20)
NEUTROPHIL %: 57 % (ref 43–77)
PLATELETS: 137 10*3/uL — ABNORMAL LOW (ref 140–440)
RBC: 3.84 10*6/uL (ref 3.63–4.92)
RDW: 15.7 % (ref 12.3–17.7)
WBC: 4.4 10*3/uL (ref 3.8–11.8)

## 2023-07-18 LAB — MAGNESIUM: MAGNESIUM: 1.7 mg/dL — ABNORMAL LOW (ref 1.9–2.7)

## 2023-07-18 LAB — COMPREHENSIVE METABOLIC PANEL, NON-FASTING
ALBUMIN/GLOBULIN RATIO: 1.4 (ref 0.8–1.4)
ALBUMIN: 3.7 g/dL (ref 3.5–5.7)
ALKALINE PHOSPHATASE: 86 U/L (ref 34–104)
ALT (SGPT): 31 U/L (ref 7–52)
ANION GAP: 4 mmol/L (ref 4–13)
AST (SGOT): 38 U/L (ref 13–39)
BILIRUBIN TOTAL: 0.8 mg/dL (ref 0.3–1.0)
BUN/CREA RATIO: 15 (ref 6–22)
BUN: 16 mg/dL (ref 7–25)
CALCIUM, CORRECTED: 10.7 mg/dL (ref 8.9–10.8)
CALCIUM: 10.5 mg/dL — ABNORMAL HIGH (ref 8.6–10.3)
CHLORIDE: 105 mmol/L (ref 98–107)
CO2 TOTAL: 31 mmol/L (ref 21–31)
CREATININE: 1.1 mg/dL (ref 0.60–1.30)
ESTIMATED GFR: 51 mL/min/{1.73_m2} — ABNORMAL LOW (ref >59)
GLOBULIN: 2.7 (ref 2.0–3.5)
GLUCOSE: 113 mg/dL — ABNORMAL HIGH (ref 74–109)
OSMOLALITY, CALCULATED: 281 mosm/kg (ref 270–290)
POTASSIUM: 4.4 mmol/L (ref 3.5–5.1)
PROTEIN TOTAL: 6.4 g/dL (ref 6.4–8.9)
SODIUM: 140 mmol/L (ref 136–145)

## 2023-07-18 MED ORDER — CHOLECALCIFEROL (VITAMIN D3) 1,250 MCG (50,000 UNIT) CAPSULE: 50000.0000 [IU] | ORAL_CAPSULE | ORAL | 3 refills | Status: DC

## 2023-07-18 MED ORDER — ALBUTEROL SULFATE HFA 90 MCG/ACTUATION AEROSOL INHALER - RN
2.0000 | Freq: Once | RESPIRATORY_TRACT | Status: DC | PRN
Start: 2023-07-18 — End: 2023-07-19

## 2023-07-18 MED ORDER — EPINEPHRINE 1 MG/ML (1 ML) INJECTION SOLUTION
0.3000 mg | Freq: Once | INTRAMUSCULAR | Status: DC | PRN
Start: 2023-07-18 — End: 2023-07-19

## 2023-07-18 MED ORDER — DIPHENHYDRAMINE 50 MG/ML INJECTION SOLUTION
50.0000 mg | Freq: Once | INTRAMUSCULAR | Status: DC | PRN
Start: 2023-07-18 — End: 2023-07-19

## 2023-07-18 MED ORDER — TRASTUZUMAB-ANNS 150 MG INTRAVENOUS SOLUTION
6.0000 mg/kg | Freq: Once | INTRAVENOUS | Status: AC
Start: 2023-07-18 — End: 2023-07-18
  Administered 2023-07-18: 525 mg via INTRAVENOUS
  Administered 2023-07-18: 0 mg via INTRAVENOUS
  Filled 2023-07-18: qty 25

## 2023-07-18 MED ORDER — DEXTROSE 5% IN WATER (D5W) FLUSH BAG - 250 ML
INTRAVENOUS | Status: DC | PRN
Start: 2023-07-18 — End: 2023-07-19

## 2023-07-18 MED ORDER — SODIUM CHLORIDE 0.9% FLUSH BAG - 250 ML
INTRAVENOUS | Status: DC | PRN
Start: 2023-07-18 — End: 2023-07-19

## 2023-07-18 MED ORDER — MEPERIDINE (PF) 25 MG/ML INJECTION SOLUTION
12.5000 mg | Freq: Once | INTRAMUSCULAR | Status: DC | PRN
Start: 2023-07-18 — End: 2023-07-19

## 2023-07-18 MED ORDER — HYDROCORTISONE SOD SUCCINATE 100 MG/2 ML VIAL WRAPPER
100.0000 mg | Freq: Once | INTRAMUSCULAR | Status: DC | PRN
Start: 2023-07-18 — End: 2023-07-19

## 2023-07-18 MED ORDER — DIPHENHYDRAMINE 50 MG/ML INJECTION SOLUTION
25.0000 mg | Freq: Once | INTRAMUSCULAR | Status: DC | PRN
Start: 2023-07-18 — End: 2023-07-19

## 2023-07-18 MED ORDER — ALBUTEROL SULFATE 2.5 MG/3 ML (0.083 %) SOLUTION FOR NEBULIZATION
2.5000 mg | INHALATION_SOLUTION | Freq: Once | RESPIRATORY_TRACT | Status: DC | PRN
Start: 2023-07-18 — End: 2023-07-19

## 2023-07-18 MED ORDER — FAMOTIDINE (PF) 20 MG/2 ML INTRAVENOUS SOLUTION
20.0000 mg | Freq: Once | INTRAVENOUS | Status: DC | PRN
Start: 2023-07-18 — End: 2023-07-19

## 2023-07-18 MED ORDER — MAGNESIUM SULFATE 1 GRAM/100 ML IN DEXTROSE 5 % INTRAVENOUS PIGGYBACK
1.0000 g | INJECTION | INTRAVENOUS | Status: AC
Start: 2023-07-18 — End: 2023-07-18
  Administered 2023-07-18: 1 g via INTRAVENOUS
  Administered 2023-07-18 (×2): 0 g via INTRAVENOUS
  Administered 2023-07-18: 1 g via INTRAVENOUS
  Filled 2023-07-18: qty 100

## 2023-07-18 NOTE — Cancer Center Note (Signed)
 HEMATOLOGY/ONCOLOGY, [facility]  [address]  [place] [address]  Operated by [facility]    Name: Kelsey Preston MRN:  [id number]   Date: 07/18/2023   DOB:  07-13-1940 (82 y.o.)         Vitals:    07/18/23 0823   BP: 133/74   Pulse: 63   Temp: 36.6 C (97.9 F)   TempSrc: Temporal   SpO2: 95%   Weight: 86.8 kg (191 lb 6.4 oz)   Height: 1.626 m (5\' 4" )   BMI: 32.85           Past Medical History:   Diagnosis Date    Diabetes mellitus, type 2     Embolism (CMS HCC)     left leg    Endometrial cancer (CMS HCC)     HTN (hypertension)     Hx of breast cancer     Lung cancer     Macular degeneration (senile) of retina            Diagnosis:  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    Failed/Intolerance to other treatments:  RHEUMOSTEOPTXFAILED: None due to compression fracture at time of diagnosis and current Breast Cancer treatment.     Reason for discontinuation of previous therapies:  N/A current compression fracture and current breast cancer treatment .    Bone Scan T Score: -2.6 on 08/13/19    FRAX Score: High on 08/13/19    History of Osteoporotic Fractures: 01/27/23 CT lumbar spine, New compression fraction of L1 with 30% loss of height and mild spinal canal narrowing.     Indications for Prolia :  Postmenopausal Osteoporosis with current fracture    Outpatient Medications Marked as Taking for the 07/18/23 encounter (Office Visit) with Angela Kell, FNP-BC   Medication Sig    carvediloL  (COREG ) 12.5 mg Oral Tablet Take 1 Tablet (12.5 mg total) by mouth Twice daily    esomeprazole magnesium  (NEXIUM) 40 mg Oral Capsule, Delayed Release(E.C.) Take 1 Capsule (40 mg total) by mouth Every morning before breakfast    ezetimibe  (ZETIA ) 10 mg Oral Tablet Take 1 Tablet (10 mg total) by mouth Every evening    famotidine  (PEPCID ) 40 mg Oral Tablet Take 1 Tablet (40 mg total) by mouth Twice daily    gabapentin  (NEURONTIN ) 100 mg Oral Capsule Take 1  Capsule (100 mg total) by mouth Every night    lidocaine -prilocaine  (EMLA ) 2.5-2.5 % Cream One time    magnesium  oxide (MAG-OX) 400 mg Oral Tablet Take 1 Tablet (400 mg total) by mouth Twice daily    metFORMIN (GLUCOPHAGE XR) 500 mg Oral Tablet Sustained Release 24 hr Take 1 Tablet (500 mg total) by mouth Twice daily    multivitamin with iron Oral Tablet Take 1 Tablet by mouth Daily    ondansetron  (ZOFRAN  ODT) 4 mg Oral Tablet, Rapid Dissolve Take 1 Tablet (4 mg total) by mouth Every 8 hours as needed for Nausea/Vomiting Indications: prevent nausea and vomiting from cancer chemotherapy    sertraline  (ZOLOFT ) 50 mg Oral Tablet Take 1 Tablet (50 mg total) by mouth Daily    traMADoL  (ULTRAM ) 50 mg Oral Tablet Take 1 Tablet (50 mg total) by mouth Every 4 hours as needed for Pain    vit C/E/Zn/coppr/lutein/zeaxan (PRESERVISION AREDS-2 ORAL) Take 1 Tablet by mouth Twice daily    XARELTO  20 mg Oral Tablet TAKE 1 TABLET BY MOUTH EVERY EVENING WITH DINNER  Treatment: Prolia  60 mg/mL SQ injection repeat every six (6) months    Labs:  Lab Results   Component Value Date    ALKPHOS 86 07/18/2023    SODIUM 140 07/18/2023    POTASSIUM 4.4 07/18/2023    CHLORIDE 105 07/18/2023    CO2 31 07/18/2023    ANIONGAP 4 07/18/2023    CALCIUM 10.5 (H) 07/18/2023    GLUCOSENF 113 (H) 07/18/2023    BUN 16 07/18/2023    CREATININE 1.10 07/18/2023    BUNCRRATIO 15 07/18/2023    GFR 51 (L) 07/18/2023    OSMBLD 281 07/18/2023         Currently taking calcium at least 1,000 mg calcium daily:  No  Currently elevated Calcium level    Currently taking vitamin D 1,000 units daily:  Yes    Angela Kell, FNP-BC  07/18/2023, 09:42 07/18/2023

## 2023-07-18 NOTE — Nurses Notes (Addendum)
 0910-Patient to room by wheelchair from doctor's office.  Patient denies N/V and diarrhea.  Good appetite reported.  Patient reports fatigue.  No other complaints.  Lung sounds are clear and normal heart sounds present.  Vitals are stable.  Magnesium  replacement ordered.Irvin Mantel, RN       Patient Assessment/Symptom Management Patient Has MD Appointment Today   Key: (+) Symptom present           (-)  Symptom not present If Symptom is Positive(+) a Nursing Note is required   Edema -   Nausea -   Vomiting -   Inability to eat/drink -   Mouth Sores -   Diarrhea -   Constipation (? Last BM) -   Fatigue that interferes with ADL's +   Numbness/Tingling -change -   Other -   Fever/Signs & Symptoms of infection -   Nurse Initials KB         0912-PAC accessed and flushed per protocol.  Blood return obtained.Irvin Mantel, RN  0915-Magnesium  infusion started (2 grams total to infuse).Irvin Mantel, RN  1016-Magnesium  infusion complete.Irvin Mantel, RN  1036-KANJINTI  infusion started.Irvin Mantel, RN  1106-KANJINTI  infusion complete.  Patient tolerated well.Irvin Mantel, RN  1125-PAC flushed and deaccessed.  Blood return obtained prior to Roxann Copper, RN  1131-Patient checking out at this time.  Vitals are stable.Irvin Mantel, RN

## 2023-07-18 NOTE — Cancer Center Note (Signed)
 Department of Hematology/Oncology  Progress Note   Name: Kelsey Preston  ZOX:[id number]  Date of Birth: 07/20/1940  Encounter Date: 07/18/2023    REFERRING PROVIDER:  Sherie Dine, MD  [address]  [place],  [place] [address]    REASON FOR OFFICE VISIT:  Breast Cancer     HISTORY OF PRESENT ILLNESS:  Kelsey Preston is a 83 y.o. female who presents today for follow up of breast cancer     The patient has a history of non-small-cell lung cancer, status post lobectomy in 2014.      She also has a history of endometrial cancer, and is status post surgical resection of that cancer as well.    More recently, the patient was found to have a left axillary lesion measuring 2.3 cm on PET-CT scan.  For some reason, I can not find an actual size measurement on the pathology report.  Multiple imaging studies of the breast were performed, but no primary lesion was found.  The malignant lesion was definitively classified as a lymph node.    Receptor staining showed that it was ER negative and HER2 Neu positive.  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    The patient was hospitalized from 7/22 to 10/09/22 while on treatment with paclitaxel  and trastuzumab  due to neutropenia and hypotension. She has also had electrolyte issues, with her magnesium  level being somewhat low.  Her Echo completed on 05/31/22 showed an Ejection Fraction of 60-65%. Her follow up Echo on 09/24/22 showed EF of 54% and Echo on 09/24/22 showed EF of 50-55%. Dr. Latisha Poland was notified of these results and he recommended holding Kinjinti and repeating Echo prior to her next dose of Kinjinti. She had a recent follow up echocardiogram completed on 01/23/23 which showed complete recovery.   In December 2024 she developed shingles on her left chest, around her left side and back. She completed Valacyclovir and was placed on Gabapentin  100 mg TID to help with the pain. She continues to have some pain, itching and burning in the affected area but otherwise recovering  well.      04/23/23:  Patient is here today for treatment with Kanjinti  for breast cancer.  She reports that she is doing well.  Her husband reports that he has noticed improvement in her.  She does still have weakness and uses a walker and wheelchair as needed to get around.  She has an echo scheduled for later today.    05/16/23:  Patient is here for follow up of breast cancer and treatment with Kanjinti .  She reports that she is having increased issues with heartburn.  This caused her throw up last night.  She denies any other new symptoms.  She is continued issues with pain from her shingles.        06/06/23:  Patient is here for follow up of breast cancer and treatment with Kanjinti .  She reports her heartburn has improved at this time.  She denies any other issues at this time.     07/18/2023:  Patient is here for follow up of breast cancer and treatment with Kanjinti .   She reports she has a catch in her hip but has been busy this week and more active than usual due to the death of her brother.   She does report diarrhea that is controlled with imodium.   She denies any chest pain or shortness of breath.     ROS:   Pertinent review of systems as discussed  in HPI    HISTORY:  Past Medical History:   Diagnosis Date    Diabetes mellitus, type 2     Embolism (CMS HCC)     left leg    Endometrial cancer (CMS HCC)     HTN (hypertension)     Hx of breast cancer     Lung cancer     Macular degeneration (senile) of retina      Past Surgical History:   Procedure Laterality Date    HIP SURGERY Left     HX CHOLECYSTECTOMY      HX COLONOSCOPY      HX HYSTERECTOMY      HX LOBECTOMY Left     LUNG CANCER SURGERY      PORTACATH PLACEMENT       Social History     Socioeconomic History    Marital status: Married     Spouse name: Not on file    Number of children: Not on file    Years of education: Not on file    Highest education level: Not on file   Occupational History    Not on file   Tobacco Use    Smoking status: Never     Smokeless tobacco: Never   Vaping Use    Vaping status: Never Used   Substance and Sexual Activity    Alcohol  use: Never    Drug use: Never    Sexual activity: Not Currently   Other Topics Concern    Not on file   Social History Narrative    Not on file     Social Determinants of Health     Financial Resource Strain: Medium Risk (01/29/2023)    Received from [facility]    Overall Financial Resource Strain (CARDIA)     Difficulty of Paying Living Expenses: Somewhat hard   Transportation Needs: No Transportation Needs (01/29/2023)    Received from [facility] - Transportation     Lack of Transportation (Medical): No     Lack of Transportation (Non-Medical): No   Social Connections: Moderately Integrated (01/29/2023)    Received from [facility]    Social Connection and Isolation Panel [NHANES]     Frequency of Communication with Friends and Family: More than three times a week     Frequency of Social Gatherings with Friends and Family: More than three times a week     Attends Religious Services: More than 4 times per year     Active Member of [employer] or Organizations: No     Attends Banker Meetings: Never     Marital Status: Married   Catering manager Violence: Not At Risk (01/29/2023)    Received from [employer], Afraid, Rape, and Kick questionnaire     Fear of Current or Ex-Partner: No     Emotionally Abused: No     Physically Abused: No     Sexually Abused: No   Housing Stability: Low Risk  (01/29/2023)    Received from [facility] Stability Vital Sign     Unable to Pay for Housing in the Last Year: No     Number of Times Moved in the Last Year: 1     Homeless in the Last Year: No     Family Medical History:       Problem Relation (Age of Onset)    Cancer Brother  Current Outpatient Medications   Medication Sig    atorvastatin  (LIPITOR) 40 mg Oral Tablet Take 1 Tablet (40 mg total) by mouth Every evening for 30 days    carvediloL   (COREG ) 12.5 mg Oral Tablet Take 1 Tablet (12.5 mg total) by mouth Twice daily    esomeprazole magnesium  (NEXIUM) 40 mg Oral Capsule, Delayed Release(E.C.) Take 1 Capsule (40 mg total) by mouth Every morning before breakfast    ezetimibe  (ZETIA ) 10 mg Oral Tablet Take 1 Tablet (10 mg total) by mouth Every evening    famotidine  (PEPCID ) 40 mg Oral Tablet Take 1 Tablet (40 mg total) by mouth Twice daily    gabapentin  (NEURONTIN ) 100 mg Oral Capsule Take 1 Capsule (100 mg total) by mouth Every night    lidocaine -prilocaine  (EMLA ) 2.5-2.5 % Cream One time    magnesium  oxide (MAG-OX) 400 mg Oral Tablet Take 1 Tablet (400 mg total) by mouth Twice daily    metFORMIN (GLUCOPHAGE XR) 500 mg Oral Tablet Sustained Release 24 hr Take 1 Tablet (500 mg total) by mouth Twice daily    multivitamin with iron Oral Tablet Take 1 Tablet by mouth Daily    ondansetron  (ZOFRAN  ODT) 4 mg Oral Tablet, Rapid Dissolve Take 1 Tablet (4 mg total) by mouth Every 8 hours as needed for Nausea/Vomiting Indications: prevent nausea and vomiting from cancer chemotherapy    sertraline  (ZOLOFT ) 50 mg Oral Tablet Take 1 Tablet (50 mg total) by mouth Daily    traMADoL  (ULTRAM ) 50 mg Oral Tablet Take 1 Tablet (50 mg total) by mouth Every 4 hours as needed for Pain    vit C/E/Zn/coppr/lutein/zeaxan (PRESERVISION AREDS-2 ORAL) Take 1 Tablet by mouth Twice daily    XARELTO  20 mg Oral Tablet TAKE 1 TABLET BY MOUTH EVERY EVENING WITH DINNER     Allergies   Allergen Reactions    Adhesive Rash    Ceclor [Cefaclor]  Other Adverse Reaction (Add comment)     Pt states a Ceclor pill gave her blisters in her mouth.       PHYSICAL EXAM:  BP 133/74 (Site: Left Arm, Patient Position: Sitting, Cuff Size: Adult)   Pulse 63   Temp 36.6 C (97.9 F) (Temporal)   Ht 1.626 m (5\' 4" )   Wt 86.8 kg (191 lb 6.4 oz)   SpO2 95%   BMI 32.85 kg/m        ECOG Status: (1) Restricted in physically strenuous activity, ambulatory and able to do work of light nature   Physical  Exam  Vitals and nursing note reviewed.   Constitutional:       Appearance: Normal appearance.   HENT:      Head: Normocephalic.      Nose: Nose normal.      Mouth/Throat:      Mouth: Mucous membranes are moist.      Pharynx: Oropharynx is clear.   Eyes:      General: No scleral icterus.     Extraocular Movements: Extraocular movements intact.   Cardiovascular:      Rate and Rhythm: Normal rate and regular rhythm.      Pulses: Normal pulses.      Heart sounds: Normal heart sounds.   Pulmonary:      Effort: Pulmonary effort is normal.      Breath sounds: Normal breath sounds.   Abdominal:      General: Bowel sounds are normal.      Palpations: Abdomen is soft.   Musculoskeletal:  General: Normal range of motion.      Cervical back: Normal range of motion and neck supple.   Skin:     General: Skin is warm and dry.   Neurological:      General: No focal deficit present.      Mental Status: She is alert and oriented to person, place, and time. Mental status is at baseline.   Psychiatric:         Mood and Affect: Mood normal.         Behavior: Behavior normal.         Thought Content: Thought content normal.         Judgment: Judgment normal.       ECHO:  Results for orders placed during the hospital encounter of 04/25/23    TRANSTHORACIC ECHOCARDIOGRAM - ADULT    Narrative  **See full report in linked PDF document**  [facility]  [address] [place], [place] [address]    Transthoracic Echocardiographic Report    ______________________________________________________________________________  Name: SAIDE, LANUZA                                     MRN: [id number]               Weight: 206 lb  Study Date: 04/25/2023 01:38 PM                         DOB: 1940/06/13             Height: 64 in  Gender: Female                                          Age: 80 yrs                 BSA: 2.0 m2  Accession #: [id number]                              BP: 150/77 mmHg  Patient Location: PRN NON INVASIVE CARD  PRN  Ordering Provider: Marzette Solders  Tech: Terrie Fetter    ______________________________________________________________________________  Procedure:  Transthoracic complete echo with contrast, 2D, spectral and tissue Doppler, color flow Doppler, M-mode.    Quality:  The study images were of technically adequate quality.    Indications: Encounter for monitoring cardiotoxic drug therapy,Encounter for monitoring cardiotoxic drug therapy    Conclusions:  Normal left ventricular size and systolic function; concentric remodeling.  No regional wall motion abnormlities.  Left ventricular EF: 65%.  Normal diastolic function.  Normal right ventricular size and systolic function.  Normal RA pressure (3 mmHg).  No pulmonary hypertension (RVSP ).  Normal aortic root and ascending aorta size.  No pericardial effusion.  No significant valvular heart disease. No aortic stenosis.  The ascending aorta is mildly dilated.  No acute changes are noted since prior examination of November 2024.    Findings  Left Ventricle:   Normal left ventricular size. Normal geometry. Left ventricular systolic function is normal. The left ventricular ejection fraction by visual assessment is estimated to be 65%. No  segmental/regional wall motion abnormalities identified. Left ventricular diastolic function could not be assessed due to the presence of AFib.  Right Ventricle:   Normal  right ventricular size. Normal right ventricular systolic function. Right ventricular systolic pressure is normal.  Left Atrium:   Severely dilated left atrium.  Right Atrium:   The right atrium is of normal size.  Mitral Valve:   The mitral valve is normal. Trace mitral regurgitation present.  Tricuspid Valve:   The tricuspid valve is normal.  Aortic Valve:   The aortic valve is mildly calcified.  Pulmonic Valve:   The pulmonic valve is normal.  Atrial Septum:   The interatrial septum is normal in appearance.  IVC/Hepatic Veins:   Normal IVC size with >50%  inspiratory collapse (estimated RA pressure 3 mmHg).  Aorta:   The aortic root is of normal size. The ascending aorta is mildly dilated.  Pericardium/Pleural space:   Normal pericardium with no pericardial effusion.    Electronically signed by: M.D. Huey Madrid on 04/25/2023 03:58 PM        DIAGNOSTIC DATA:  Results for orders placed during the hospital encounter of 01/23/23    PET UR:KYHC (HEAD TO THIGH) WO IV CONTRAST    Narrative  Calisa Bielby    RADIOLOGIST: Baxter Limber    PET CT SUBSEQUENT: BODY (HEAD TO THIGH) WO IV CONTRAST performed on 01/23/2023 1:38 PM    CLINICAL HISTORY: C50.919: Breast cancer (CMS HCC).  BREAST CANCER/CURRENTLY UNDER GOING TREATMENT FOR LEFT BREAST CANCER/HX OF ENDOMETRIAL AND LUNG CA EXCISIONAL BIOPSY LEFT AXILLA 08/07/2022.    Subsequent Treatment Strategy.    TECHNIQUE:  F-18 fluorodeoxyglucose intravenously with PET-CT from the skull base to the proximal thighs  Uptake Time: 60 minutes  Serum Glucose: 111 mg/dL at the time of injection  SUV Measurement Type: SUVmax  SUV Normalization Method: Body weight    RADIOPHARMACEUTICAL: 12.4 millicuries F-18 FDG    COMPARISON: PET/CT from 07/04/2022. CT abdomen and pelvis from 01/05/2023.    FINDINGS:  Reference Background Values:  Mediastinal Blood Pool: 2.47 SUV max  Volumetric Normal Liver: 3.39 SUV max    HEAD AND NECK: No suspicious focal hypermetabolic activity. No hypermetabolic cervical or supraclavicular lymph nodes. No hypermetabolic parotid gland lesion is seen. No suspicious hypermetabolic activity about the pharynx.    CHEST: The previously seen hypermetabolic left axillary lesion-lymph node on PET/CT from 07/04/2022 is no longer identified. There are no current hypermetabolic mediastinal, hilar, internal mammary or axillary lymph nodes identified. No hypermetabolic lung lesion. No pleural effusion.    ABDOMEN AND PELVIS: No suspicious focal hypermetabolic activity is identified. No ascites. No hydronephrosis. Genitourinary  activity appears physiologic. Left adrenal gland nodularity is unchanged. Stable borderline enlargement of the spleen. No convincing focal hypermetabolic activity involving the stomach to confirm gastritis or focal active inflammation.    The majority of the colon is again seen to be partially decompressed similar to CT from 01/05/2023 with some liquid stool again suggested within the colon which may be related to diarrheal state. No suspicious focal increased hypermetabolic activity within the colon is seen. Generalized metabolic activity throughout the colon as well as rectum is present and may be physiologic. Please correlate with clinical history for possible degree of generalized nonspecific enteritis-colitis with possible diarrhea. These findings are also suggested on the prior CT from 01/05/2023.    MUSCULOSKELETAL: Continued slight increase concavity of the L1 vertebral body inferior endplate is suggested on CT localizer images compatible with a compression fracture without bony retropulsion when compared to 01/05/2023. Some relatively low level metabolic activity essentially paralleling the L1 vertebral body inferior endplate is present without discrete bone lesion  seen on CT localizer images.    No osseous destructive lesion is seen. Postsurgical hardware about the proximal left femur again partially visualized. Discogenic and degenerative arthritic changes of the spine again seen at multiple levels. No retroperitoneal hematoma. Psoas musculature is grossly symmetric.    Impression  1. L1 VERTEBRAL BODY MILD COMPRESSION FRACTURE DEFORMITY WITH CONCAVITY OF THE INFERIOR ENDPLATE WHICH MAY BE SLIGHTLY MORE PRONOUNCED WHEN COMPARED TO 02/04/2023. SOME METABOLIC ACTIVITY WITHIN THE VERTEBRAL BODY ALONG THE INFERIOR ENDPLATE IS PRESENT AND COULD BE RELATED TO RECENT FRACTURE. PLEASE CONSIDER MRI PREFERABLY WITHOUT AND WITH IV CONTRAST FOR FURTHER AND MORE DEFINITIVE EVALUATION OF THE LUMBAR SPINE PARTICULARLY  THE L1 LEVEL AND HELP COMPLETELY EXCLUDE THE POSSIBILITY OF A PATHOLOGIC FRACTURE.    2. NO CURRENT SUSPICIOUS FOCAL HYPERMETABOLIC ACTIVITY INVOLVING THE SOFT TISSUES OF THE NECK, CHEST, ABDOMEN OR PELVIS TO SUGGEST MALIGNANCY-METASTASIS WHEN COMPARED TO 07/04/2022.    3. PREVIOUSLY SEEN HYPERMETABOLIC LEFT AXILLARY LESION-LYMPH NODE ON 07/04/2022 IS NO LONGER PRESENT.    4. PERSISTENT IMAGING FINDINGS SUGGESTING A POSSIBLE DEGREE OF NONSPECIFIC ENTERITIS -COLITIS WHEN COMPARED TO CT FROM 02-04-23. PLEASE CORRELATE WITH CLINICAL FINDINGS AND PATIENT HISTORY.        Radiologist location ID: [id number]         LABS:   CBC  Diff   Lab Results   Component Value Date/Time    WBC 4.4 07/18/2023 08:07 AM    HGB 11.2 07/18/2023 08:07 AM    HCT 33.7 07/18/2023 08:07 AM    PLTCNT 137 (L) 07/18/2023 08:07 AM    RBC 3.84 07/18/2023 08:07 AM    MCV 87.8 07/18/2023 08:07 AM    MCHC 33.3 07/18/2023 08:07 AM    MCH 29.2 07/18/2023 08:07 AM    RDW 15.7 07/18/2023 08:07 AM    MPV 8.8 07/18/2023 08:07 AM    Lab Results   Component Value Date/Time    PMNS 57 07/18/2023 08:07 AM    LYMPHOCYTES 30 07/18/2023 08:07 AM    EOSINOPHIL 2 07/18/2023 08:07 AM    MONOCYTES 9 07/18/2023 08:07 AM    BASOPHILS 1 07/18/2023 08:07 AM    BASOPHILS 0.00 07/18/2023 08:07 AM    PMNABS 2.50 07/18/2023 08:07 AM    LYMPHSABS 1.30 07/18/2023 08:07 AM    EOSABS 0.10 07/18/2023 08:07 AM    MONOSABS 0.40 07/18/2023 08:07 AM    BASABS 0.04 05/30/2022 05:40 AM            Comprehensive Metabolic Profile    Lab Results   Component Value Date    SODIUM 140 07/18/2023    POTASSIUM 4.4 07/18/2023    CHLORIDE 105 07/18/2023    CO2 31 07/18/2023    ANIONGAP 4 07/18/2023    BUN 16 07/18/2023    CREATININE 1.10 07/18/2023    ALBUMIN 3.7 07/18/2023    CALCIUM 10.5 (H) 07/18/2023    GLUCOSENF 113 (H) 07/18/2023    ALKPHOS 86 07/18/2023    ALT 31 07/18/2023    AST 38 07/18/2023    TOTBILIRUBIN 0.8 07/18/2023    TOTALPROTEIN 6.4 07/18/2023         ESTIMATED GFR   Date  Value Ref Range Status   07/18/2023 51 (L) >59 mL/min/1.89m^2 Final     ASSESSMENT:      ICD-10-CM    1. Malignant neoplasm of left breast in female, estrogen receptor negative, unspecified site of breast (CMS HCC)  C50.912 TRANSTHORACIC ECHOCARDIOGRAM - ADULT    Z17.1  2. Encounter for monitoring cardiotoxic drug therapy  Z51.81 TRANSTHORACIC ECHOCARDIOGRAM - ADULT    Z79.899                      PLAN:   1. Breast cancer:    Status post resection of an involved lymph node that was 2.3 cm on PET/CT.  No primary lesion was seen on imaging studies.  T0 N1 M0, stage IIA.  ER negative, HER2 Neu positive.  After resection of the node, she was rendered cancer-free radiographically.  She is on treatment with adjuvant paclitaxel  and trastuzumab .  We had previously discussed the options of either no additional surgery and taking our chances versus radiation of the breast versus mastectomy.  Radiation to the axillary area will likely be required due to the lymph node that was known to be involved.  Patient has seen Dr. Asher Lawn and according to his note he would favor radiation if her Signatera was positive, however her Signatera was negative on 02/18/2023.    Her cardiac function is acceptable, and she will continue trastuzumab  adjuvant therapy. Her next ECHO is due around 08/13/23  I will have this scheduled and ordered today.  2. History of lung cancer:    Continue surveillance.    3. History of gynecologic cancer:   Continue surveillance as well.  4. Compression fracture:   The patient had a recent compression fracture after suffering a fall.  I do not have any reason to suspect that it was pathologic in nature.  We discussed use of Prolia  in order to prevent future skeletal related events.  She received her 1st dose of Prolia  on 02/12/23 and is scheduled for her next dose 08/20/23.    Prolia  note completed today.  Vitamin D 50000 units ordered weekly.  Hold on calcium as patient is elevated presently.   5. Shingles:    resolving, post Acyclovir and currently on Gabapentin .   6. Heartburn:   Improved with increase in Pepcid      Ishanvi Mcquitty was given the chance to ask questions, and these were answered to their satisfaction. The patient is welcome to call with any questions or concerns in the meantime.       Return in about 6 weeks (around 08/29/2023).   Angela Kell APRN, FNP-BC, AOCNP, 07/18/2023 , 09:41   You can see your note(s) in [facility]. It is common for you to encounter certain medical terminology which may be unfamiliar to you. You might see results before your provider does so please give at least 2 business days for review. Please have this understanding, that NOT all abnormal results are significant. Our office will contact you for any urgent or emergent action if necessary. If you have any questions or concerns, feel free to send a MyChart message or call the office. Please call with any new or concerning symptoms.   The patient's insurance company bears full legal and financial responsibility resulting from any deviations that they cause to my recommended treatment plan.   CC:  Sherie Dine, MD  [address]  [place] [address]    Sherie Dine, MD  [address]  [place],  [place] [address]    This note was partially generated using MModal Fluency Direct system, and there may be some incorrect words, spellings, and punctuation that were not noted in checking the note before saving.

## 2023-07-23 ENCOUNTER — Encounter (INDEPENDENT_AMBULATORY_CARE_PROVIDER_SITE_OTHER): Payer: Self-pay | Admitting: HEMATOLOGY-ONCOLOGY

## 2023-08-05 ENCOUNTER — Encounter (INDEPENDENT_AMBULATORY_CARE_PROVIDER_SITE_OTHER): Payer: Self-pay | Admitting: HEMATOLOGY-ONCOLOGY

## 2023-08-07 ENCOUNTER — Other Ambulatory Visit (INDEPENDENT_AMBULATORY_CARE_PROVIDER_SITE_OTHER): Payer: Self-pay | Admitting: HEMATOLOGY-ONCOLOGY

## 2023-08-08 ENCOUNTER — Encounter (HOSPITAL_COMMUNITY): Payer: Self-pay

## 2023-08-08 ENCOUNTER — Other Ambulatory Visit (INDEPENDENT_AMBULATORY_CARE_PROVIDER_SITE_OTHER): Payer: Self-pay | Admitting: HEMATOLOGY-ONCOLOGY

## 2023-08-08 ENCOUNTER — Other Ambulatory Visit: Payer: Self-pay

## 2023-08-08 ENCOUNTER — Ambulatory Visit
Admission: RE | Admit: 2023-08-08 | Discharge: 2023-08-08 | Disposition: A | Discharge status: Home / Self Care | Source: Ambulatory Visit | Attending: HEMATOLOGY-ONCOLOGY | Admitting: HEMATOLOGY-ONCOLOGY

## 2023-08-08 VITALS — BP 122/75 | HR 63 | Temp 97.1°F | Resp 20 | Ht 64.0 in | Wt 187.4 lb

## 2023-08-08 DIAGNOSIS — Z5112 Encounter for antineoplastic immunotherapy: Secondary | ICD-10-CM | POA: Insufficient documentation

## 2023-08-08 DIAGNOSIS — C50919 Malignant neoplasm of unspecified site of unspecified female breast: Secondary | ICD-10-CM | POA: Insufficient documentation

## 2023-08-08 DIAGNOSIS — I82412 Acute embolism and thrombosis of left femoral vein: Secondary | ICD-10-CM | POA: Insufficient documentation

## 2023-08-08 LAB — CBC WITH DIFF
BASOPHIL #: 0 10*3/uL (ref 0.00–0.10)
BASOPHIL %: 1 % (ref 0–1)
EOSINOPHIL #: 0.1 10*3/uL (ref 0.00–0.50)
EOSINOPHIL %: 2 % (ref 1–7)
HCT: 34.7 % (ref 31.2–41.9)
HGB: 11.8 g/dL (ref 10.9–14.3)
LYMPHOCYTE #: 1.3 10*3/uL (ref 1.10–3.10)
LYMPHOCYTE %: 33 % (ref 16–46)
MCH: 29.7 pg (ref 24.7–32.8)
MCHC: 34.1 g/dL (ref 32.3–35.6)
MCV: 87.1 fL (ref 75.5–95.3)
MONOCYTE #: 0.3 10*3/uL (ref 0.20–0.90)
MONOCYTE %: 8 % (ref 4–11)
MPV: 9.1 fL (ref 7.9–10.8)
NEUTROPHIL #: 2.2 10*3/uL (ref 1.90–8.20)
NEUTROPHIL %: 56 % (ref 43–77)
PLATELETS: 130 10*3/uL — ABNORMAL LOW (ref 140–440)
RBC: 3.99 10*6/uL (ref 3.63–4.92)
RDW: 14.7 % (ref 12.3–17.7)
WBC: 4 10*3/uL (ref 3.8–11.8)

## 2023-08-08 LAB — COMPREHENSIVE METABOLIC PANEL, NON-FASTING
ALBUMIN/GLOBULIN RATIO: 1.3 (ref 0.8–1.4)
ALBUMIN: 3.7 g/dL (ref 3.5–5.7)
ALKALINE PHOSPHATASE: 93 U/L (ref 34–104)
ALT (SGPT): 18 U/L (ref 7–52)
ANION GAP: 7 mmol/L (ref 4–13)
AST (SGOT): 27 U/L (ref 13–39)
BILIRUBIN TOTAL: 0.7 mg/dL (ref 0.3–1.0)
BUN/CREA RATIO: 13 (ref 6–22)
BUN: 15 mg/dL (ref 7–25)
CALCIUM, CORRECTED: 11.1 mg/dL — ABNORMAL HIGH (ref 8.9–10.8)
CALCIUM: 10.9 mg/dL — ABNORMAL HIGH (ref 8.6–10.3)
CHLORIDE: 104 mmol/L (ref 98–107)
CO2 TOTAL: 29 mmol/L (ref 21–31)
CREATININE: 1.15 mg/dL (ref 0.60–1.30)
ESTIMATED GFR: 48 mL/min/{1.73_m2} — ABNORMAL LOW (ref >59)
GLOBULIN: 2.9 (ref 2.0–3.5)
GLUCOSE: 152 mg/dL — ABNORMAL HIGH (ref 74–109)
OSMOLALITY, CALCULATED: 283 mosm/kg (ref 270–290)
POTASSIUM: 4.1 mmol/L (ref 3.5–5.1)
PROTEIN TOTAL: 6.6 g/dL (ref 6.4–8.9)
SODIUM: 140 mmol/L (ref 136–145)

## 2023-08-08 LAB — MAGNESIUM: MAGNESIUM: 1.5 mg/dL — ABNORMAL LOW (ref 1.9–2.7)

## 2023-08-08 MED ORDER — FAMOTIDINE (PF) 20 MG/2 ML INTRAVENOUS SOLUTION
20.0000 mg | Freq: Once | INTRAVENOUS | Status: DC | PRN
Start: 2023-08-08 — End: 2023-08-09

## 2023-08-08 MED ORDER — DIPHENHYDRAMINE 50 MG/ML INJECTION SOLUTION
25.0000 mg | Freq: Once | INTRAMUSCULAR | Status: DC | PRN
Start: 2023-08-08 — End: 2023-08-09

## 2023-08-08 MED ORDER — TRASTUZUMAB-ANNS 150 MG INTRAVENOUS SOLUTION
6.0000 mg/kg | Freq: Once | INTRAVENOUS | Status: AC
Start: 2023-08-08 — End: 2023-08-08
  Administered 2023-08-08: 0 mg via INTRAVENOUS
  Administered 2023-08-08: 525 mg via INTRAVENOUS
  Filled 2023-08-08: qty 25

## 2023-08-08 MED ORDER — ALBUTEROL SULFATE HFA 90 MCG/ACTUATION AEROSOL INHALER - RN
2.0000 | Freq: Once | RESPIRATORY_TRACT | Status: DC | PRN
Start: 2023-08-08 — End: 2023-08-09

## 2023-08-08 MED ORDER — DEXTROSE 5% IN WATER (D5W) FLUSH BAG - 250 ML
INTRAVENOUS | Status: DC | PRN
Start: 2023-08-08 — End: 2023-08-09

## 2023-08-08 MED ORDER — MEPERIDINE (PF) 25 MG/ML INJECTION SOLUTION
12.5000 mg | Freq: Once | INTRAMUSCULAR | Status: DC | PRN
Start: 2023-08-08 — End: 2023-08-09

## 2023-08-08 MED ORDER — SODIUM CHLORIDE 0.9% FLUSH BAG - 250 ML
INTRAVENOUS | Status: DC | PRN
Start: 2023-08-08 — End: 2023-08-09

## 2023-08-08 MED ORDER — MAGNESIUM SULFATE 1 GRAM/100 ML IN DEXTROSE 5 % INTRAVENOUS PIGGYBACK
1.0000 g | INJECTION | INTRAVENOUS | Status: AC
Start: 2023-08-08 — End: 2023-08-08
  Administered 2023-08-08: 1 g via INTRAVENOUS
  Administered 2023-08-08 (×2): 0 g via INTRAVENOUS
  Administered 2023-08-08: 1 g via INTRAVENOUS
  Filled 2023-08-08 (×2): qty 100

## 2023-08-08 MED ORDER — DIPHENHYDRAMINE 50 MG/ML INJECTION SOLUTION
50.0000 mg | Freq: Once | INTRAMUSCULAR | Status: DC | PRN
Start: 2023-08-08 — End: 2023-08-09

## 2023-08-08 MED ORDER — HYDROCORTISONE SOD SUCCINATE 100 MG/2 ML VIAL WRAPPER
100.0000 mg | Freq: Once | INTRAMUSCULAR | Status: DC | PRN
Start: 2023-08-08 — End: 2023-08-09

## 2023-08-08 MED ORDER — ALBUTEROL SULFATE 2.5 MG/3 ML (0.083 %) SOLUTION FOR NEBULIZATION
2.5000 mg | INHALATION_SOLUTION | Freq: Once | RESPIRATORY_TRACT | Status: DC | PRN
Start: 2023-08-08 — End: 2023-08-09

## 2023-08-08 MED ORDER — EPINEPHRINE 1 MG/ML (1 ML) INJECTION SOLUTION
0.3000 mg | Freq: Once | INTRAMUSCULAR | Status: DC | PRN
Start: 2023-08-08 — End: 2023-08-09

## 2023-08-08 NOTE — Nurses Notes (Addendum)
 [id number] patient arrived to floor via wheelchair accompanied by spouse. Patient here for Kanjiniti infusion. To to room 231B. Tad Eye, RN  1015 VSS. Right chest port accessed. Blood return noted. Labs collected at this time Tad Eye, RN  1025 assessment completed. Edema noted BLE. Patient reports no pain no complaints or concerns at this time. Spouse at chair side at this time Tad Eye, RN  Patient Assessment/Symptom Management Patient Has No MD Appointment Today   Key: (+) Symptom present           (-)  Symptom not present If Symptom is Positive(+) a Nursing Note is required   Edema +   Uncontrolled Nausea -   Vomiting -   Inability to eat/drink -   Mouth Sores -   Diarrhea +   Constipation (? Last BM) -   Fatigue that interferes with ADL's -   Numbness/Tingling -change -   Other -   Fever/Signs & Symptoms of infection -   Nurse Initials Eb      Patient denies symptoms at this time Tad Eye, RN      1059 orders released at this time. Mag 1.5 will infuse 2 grams per protocol. Tad Eye, RN  [phone] magnesium  infusion started. Total of 2 grams. Tad Eye, RN  [phone] magnesium  infusion completed. Patient tolerated well. Tad Eye, RN  [phone] kanjinti  infusion started. Tad Eye, RN  1247 kanjinti  infusion completed. Patient tolerated well. Tad Eye, RN  1300 right chest port deaccessed. Pressure dressing applied. Tad Eye, RN  [phone] patient left floor via wheelchair accompanied by spouse. Tad Eye, RN

## 2023-08-13 ENCOUNTER — Ambulatory Visit
Admission: RE | Admit: 2023-08-13 | Discharge: 2023-08-13 | Disposition: A | Discharge status: Home / Self Care | Payer: Self-pay | Source: Ambulatory Visit | Attending: NURSE PRACTITIONER | Admitting: NURSE PRACTITIONER

## 2023-08-13 ENCOUNTER — Other Ambulatory Visit: Payer: Self-pay

## 2023-08-13 DIAGNOSIS — Z171 Estrogen receptor negative status [ER-]: Secondary | ICD-10-CM | POA: Insufficient documentation

## 2023-08-13 DIAGNOSIS — Z79899 Other long term (current) drug therapy: Secondary | ICD-10-CM | POA: Insufficient documentation

## 2023-08-13 DIAGNOSIS — Z5181 Encounter for therapeutic drug level monitoring: Secondary | ICD-10-CM | POA: Insufficient documentation

## 2023-08-13 DIAGNOSIS — C50912 Malignant neoplasm of unspecified site of left female breast: Secondary | ICD-10-CM | POA: Insufficient documentation

## 2023-08-13 LAB — TRANSTHORACIC ECHOCARDIOGRAM - ADULT: EF VISUAL ESTIMATE: 65

## 2023-08-13 MED ORDER — GABAPENTIN 100 MG CAPSULE
100.0000 mg | ORAL_CAPSULE | Freq: Every evening | ORAL | 0 refills | Status: AC
Start: 2023-08-13 — End: ?

## 2023-08-13 MED ORDER — SODIUM CHLORIDE 0.9 % INJECTION SOLUTION
2.0000 mL | Freq: Once | INTRAVENOUS | Status: DC | PRN
Start: 2023-08-13 — End: 2023-08-14
  Administered 2023-08-13: 2 mL via INTRAVENOUS

## 2023-08-13 MED ORDER — ONDANSETRON HCL 4 MG TABLET
4.0000 mg | ORAL_TABLET | Freq: Four times a day (QID) | ORAL | 3 refills | Status: DC | PRN
Start: 2023-08-13 — End: 2023-10-17

## 2023-08-14 ENCOUNTER — Ambulatory Visit (INDEPENDENT_AMBULATORY_CARE_PROVIDER_SITE_OTHER): Payer: Self-pay | Admitting: NURSE PRACTITIONER

## 2023-08-14 NOTE — Result Encounter Note (Signed)
 EF remains 65% continue with treatment at this time.

## 2023-08-15 ENCOUNTER — Encounter (INDEPENDENT_AMBULATORY_CARE_PROVIDER_SITE_OTHER): Payer: Self-pay | Admitting: HEMATOLOGY-ONCOLOGY

## 2023-08-20 ENCOUNTER — Encounter (INDEPENDENT_AMBULATORY_CARE_PROVIDER_SITE_OTHER): Payer: Self-pay | Admitting: HEMATOLOGY-ONCOLOGY

## 2023-08-28 ENCOUNTER — Other Ambulatory Visit (INDEPENDENT_AMBULATORY_CARE_PROVIDER_SITE_OTHER): Payer: Self-pay | Admitting: HEMATOLOGY-ONCOLOGY

## 2023-08-28 ENCOUNTER — Encounter (INDEPENDENT_AMBULATORY_CARE_PROVIDER_SITE_OTHER): Payer: Self-pay | Admitting: HEMATOLOGY-ONCOLOGY

## 2023-08-29 ENCOUNTER — Encounter (INDEPENDENT_AMBULATORY_CARE_PROVIDER_SITE_OTHER): Payer: Self-pay | Admitting: HEMATOLOGY-ONCOLOGY

## 2023-08-29 ENCOUNTER — Other Ambulatory Visit: Payer: Self-pay

## 2023-08-29 ENCOUNTER — Ambulatory Visit (INDEPENDENT_AMBULATORY_CARE_PROVIDER_SITE_OTHER)
Admission: RE | Admit: 2023-08-29 | Discharge: 2023-08-29 | Disposition: A | Discharge status: Home / Self Care | Payer: Self-pay | Source: Ambulatory Visit | Attending: NURSE PRACTITIONER | Admitting: NURSE PRACTITIONER

## 2023-08-29 ENCOUNTER — Encounter (INDEPENDENT_AMBULATORY_CARE_PROVIDER_SITE_OTHER): Payer: Self-pay | Admitting: NURSE PRACTITIONER

## 2023-08-29 ENCOUNTER — Ambulatory Visit (HOSPITAL_BASED_OUTPATIENT_CLINIC_OR_DEPARTMENT_OTHER): Payer: Self-pay | Admitting: NURSE PRACTITIONER

## 2023-08-29 ENCOUNTER — Ambulatory Visit
Admission: RE | Admit: 2023-08-29 | Discharge: 2023-08-29 | Disposition: A | Discharge status: Home / Self Care | Source: Ambulatory Visit | Attending: HEMATOLOGY-ONCOLOGY | Admitting: HEMATOLOGY-ONCOLOGY

## 2023-08-29 VITALS — BP 141/74 | HR 67 | Temp 97.5°F | Resp 20

## 2023-08-29 VITALS — BP 126/52 | HR 64 | Temp 98.2°F | Ht 64.0 in | Wt 196.4 lb

## 2023-08-29 DIAGNOSIS — R12 Heartburn: Secondary | ICD-10-CM | POA: Insufficient documentation

## 2023-08-29 DIAGNOSIS — Z171 Estrogen receptor negative status [ER-]: Secondary | ICD-10-CM

## 2023-08-29 DIAGNOSIS — I82412 Acute embolism and thrombosis of left femoral vein: Secondary | ICD-10-CM

## 2023-08-29 DIAGNOSIS — Z1731 Human epidermal growth factor receptor 2 positive status: Secondary | ICD-10-CM | POA: Insufficient documentation

## 2023-08-29 DIAGNOSIS — C50919 Malignant neoplasm of unspecified site of unspecified female breast: Secondary | ICD-10-CM

## 2023-08-29 DIAGNOSIS — C50912 Malignant neoplasm of unspecified site of left female breast: Secondary | ICD-10-CM

## 2023-08-29 DIAGNOSIS — M810 Age-related osteoporosis without current pathological fracture: Secondary | ICD-10-CM

## 2023-08-29 DIAGNOSIS — B029 Zoster without complications: Secondary | ICD-10-CM | POA: Insufficient documentation

## 2023-08-29 DIAGNOSIS — Z5112 Encounter for antineoplastic immunotherapy: Secondary | ICD-10-CM | POA: Insufficient documentation

## 2023-08-29 DIAGNOSIS — W19XXXA Unspecified fall, initial encounter: Secondary | ICD-10-CM | POA: Insufficient documentation

## 2023-08-29 DIAGNOSIS — Z853 Personal history of malignant neoplasm of breast: Secondary | ICD-10-CM

## 2023-08-29 DIAGNOSIS — Z79624 Long term (current) use of inhibitors of nucleotide synthesis: Secondary | ICD-10-CM | POA: Insufficient documentation

## 2023-08-29 DIAGNOSIS — Z85118 Personal history of other malignant neoplasm of bronchus and lung: Secondary | ICD-10-CM | POA: Insufficient documentation

## 2023-08-29 DIAGNOSIS — S32000D Wedge compression fracture of unspecified lumbar vertebra, subsequent encounter for fracture with routine healing: Secondary | ICD-10-CM

## 2023-08-29 DIAGNOSIS — Z8542 Personal history of malignant neoplasm of other parts of uterus: Secondary | ICD-10-CM | POA: Insufficient documentation

## 2023-08-29 DIAGNOSIS — Z79620 Long term (current) use of immunosuppressive biologic: Secondary | ICD-10-CM | POA: Insufficient documentation

## 2023-08-29 DIAGNOSIS — S32019A Unspecified fracture of first lumbar vertebra, initial encounter for closed fracture: Secondary | ICD-10-CM | POA: Insufficient documentation

## 2023-08-29 LAB — CBC WITH DIFF
BASOPHIL #: 0 10*3/uL (ref 0.00–0.10)
BASOPHIL %: 1 % (ref 0–1)
EOSINOPHIL #: 0.1 10*3/uL (ref 0.00–0.50)
EOSINOPHIL %: 2 % (ref 1–7)
HCT: 33 % (ref 31.2–41.9)
HGB: 11.1 g/dL (ref 10.9–14.3)
LYMPHOCYTE #: 1.2 10*3/uL (ref 1.10–3.10)
LYMPHOCYTE %: 36 % (ref 16–46)
MCH: 29.4 pg (ref 24.7–32.8)
MCHC: 33.6 g/dL (ref 32.3–35.6)
MCV: 87.7 fL (ref 75.5–95.3)
MONOCYTE #: 0.3 10*3/uL (ref 0.20–0.90)
MONOCYTE %: 9 % (ref 4–11)
MPV: 9.2 fL (ref 7.9–10.8)
NEUTROPHIL #: 1.8 10*3/uL — ABNORMAL LOW (ref 1.90–8.20)
NEUTROPHIL %: 52 % (ref 43–77)
PLATELETS: 119 10*3/uL — ABNORMAL LOW (ref 140–440)
RBC: 3.76 10*6/uL (ref 3.63–4.92)
RDW: 14.3 % (ref 12.3–17.7)
WBC: 3.4 10*3/uL — ABNORMAL LOW (ref 3.8–11.8)

## 2023-08-29 LAB — COMPREHENSIVE METABOLIC PANEL, NON-FASTING
ALBUMIN/GLOBULIN RATIO: 1.2 (ref 0.8–1.4)
ALBUMIN: 3.4 g/dL — ABNORMAL LOW (ref 3.5–5.7)
ALKALINE PHOSPHATASE: 96 U/L (ref 34–104)
ALT (SGPT): 18 U/L (ref 7–52)
ANION GAP: 4 mmol/L (ref 4–13)
AST (SGOT): 28 U/L (ref 13–39)
BILIRUBIN TOTAL: 0.5 mg/dL (ref 0.3–1.0)
BUN/CREA RATIO: 13 (ref 6–22)
BUN: 16 mg/dL (ref 7–25)
CALCIUM, CORRECTED: 10.5 mg/dL (ref 8.9–10.8)
CALCIUM: 10 mg/dL (ref 8.6–10.3)
CHLORIDE: 106 mmol/L (ref 98–107)
CO2 TOTAL: 30 mmol/L (ref 21–31)
CREATININE: 1.19 mg/dL (ref 0.60–1.30)
ESTIMATED GFR: 46 mL/min/{1.73_m2} — ABNORMAL LOW (ref >59)
GLOBULIN: 2.8 (ref 2.0–3.5)
GLUCOSE: 166 mg/dL — ABNORMAL HIGH (ref 74–109)
OSMOLALITY, CALCULATED: 284 mosm/kg (ref 270–290)
POTASSIUM: 4.1 mmol/L (ref 3.5–5.1)
PROTEIN TOTAL: 6.2 g/dL — ABNORMAL LOW (ref 6.4–8.9)
SODIUM: 140 mmol/L (ref 136–145)

## 2023-08-29 LAB — MAGNESIUM: MAGNESIUM: 1.6 mg/dL — ABNORMAL LOW (ref 1.9–2.7)

## 2023-08-29 MED ORDER — HYDROCORTISONE SOD SUCCINATE 100 MG/2 ML VIAL WRAPPER
100.0000 mg | Freq: Once | INTRAMUSCULAR | Status: DC | PRN
Start: 2023-08-29 — End: 2023-08-30

## 2023-08-29 MED ORDER — DIPHENHYDRAMINE 50 MG/ML INJECTION SOLUTION
50.0000 mg | Freq: Once | INTRAMUSCULAR | Status: DC | PRN
Start: 2023-08-29 — End: 2023-08-30

## 2023-08-29 MED ORDER — MEPERIDINE (PF) 25 MG/ML INJECTION SOLUTION
12.5000 mg | Freq: Once | INTRAMUSCULAR | Status: DC | PRN
Start: 2023-08-29 — End: 2023-08-30

## 2023-08-29 MED ORDER — DIPHENHYDRAMINE 50 MG/ML INJECTION SOLUTION
25.0000 mg | Freq: Once | INTRAMUSCULAR | Status: DC | PRN
Start: 2023-08-29 — End: 2023-08-30

## 2023-08-29 MED ORDER — DENOSUMAB 60 MG/ML SUBCUTANEOUS SYRINGE
60.0000 mg | INJECTION | Freq: Once | SUBCUTANEOUS | Status: AC
Start: 2023-08-29 — End: 2023-08-29
  Administered 2023-08-29: 60 mg via SUBCUTANEOUS
  Filled 2023-08-29: qty 1

## 2023-08-29 MED ORDER — DEXTROSE 5% IN WATER (D5W) FLUSH BAG - 250 ML
INTRAVENOUS | Status: DC | PRN
Start: 2023-08-29 — End: 2023-08-30

## 2023-08-29 MED ORDER — TRASTUZUMAB-ANNS 150 MG INTRAVENOUS SOLUTION
6.0000 mg/kg | Freq: Once | INTRAVENOUS | Status: AC
Start: 2023-08-29 — End: 2023-08-29
  Administered 2023-08-29: 525 mg via INTRAVENOUS
  Administered 2023-08-29: 0 mg via INTRAVENOUS
  Filled 2023-08-29: qty 25

## 2023-08-29 MED ORDER — MAGNESIUM SULFATE 1 GRAM/100 ML IN DEXTROSE 5 % INTRAVENOUS PIGGYBACK
1.0000 g | INJECTION | INTRAVENOUS | Status: AC
Start: 2023-08-29 — End: 2023-08-29
  Administered 2023-08-29 (×2): 1 g via INTRAVENOUS
  Administered 2023-08-29 (×2): 0 g via INTRAVENOUS
  Filled 2023-08-29 (×2): qty 100

## 2023-08-29 MED ORDER — EPINEPHRINE 1 MG/ML (1 ML) INJECTION SOLUTION
0.3000 mg | Freq: Once | INTRAMUSCULAR | Status: DC | PRN
Start: 2023-08-29 — End: 2023-08-30

## 2023-08-29 MED ORDER — ALBUTEROL SULFATE 2.5 MG/3 ML (0.083 %) SOLUTION FOR NEBULIZATION
2.5000 mg | INHALATION_SOLUTION | Freq: Once | RESPIRATORY_TRACT | Status: DC | PRN
Start: 2023-08-29 — End: 2023-08-30

## 2023-08-29 MED ORDER — ALBUTEROL SULFATE HFA 90 MCG/ACTUATION AEROSOL INHALER - RN
2.0000 | Freq: Once | RESPIRATORY_TRACT | Status: DC | PRN
Start: 2023-08-29 — End: 2023-08-30

## 2023-08-29 MED ORDER — SODIUM CHLORIDE 0.9% FLUSH BAG - 250 ML
INTRAVENOUS | Status: DC | PRN
Start: 2023-08-29 — End: 2023-08-30

## 2023-08-29 MED ORDER — FAMOTIDINE (PF) 20 MG/2 ML INTRAVENOUS SOLUTION
20.0000 mg | Freq: Once | INTRAVENOUS | Status: DC | PRN
Start: 2023-08-29 — End: 2023-08-30

## 2023-08-29 NOTE — Nurses Notes (Signed)
 2536: Patient, daughter and husband to room 232. Has already seen provider and had labs drawn.Malva Search, RN  1005: Right port accessed per protocol.Malva Search, RN  [phone] : A total of 2 gms macnesium given IV-each over 30 minutes per pharmacy.  For mag level 1.6 . Malva Search, RN  [phone]: 525 mg Kanjinti  given IV per orders. Malva Search, RN  1230: Prolia  injection given in RUA.Malva Search, RN  1240: Infusions complete.  Port de-accessed per policy. Malva Search, RN  [phone]: Pt left via w/c with family. No complaints voiced. Malva Search, RN

## 2023-08-29 NOTE — Cancer Center Note (Signed)
 Department of Hematology/Oncology  Progress Note   Name: Toshie Demelo  WJX:[id number]  Date of Birth: [birth year]  Encounter Date: 08/29/2023    REFERRING PROVIDER:  Sherie Dine, MD  [address]  [place],  [place] [address]    REASON FOR OFFICE VISIT:  Breast Cancer     HISTORY OF PRESENT ILLNESS:  Kelsey Preston is a 83 y.o. female who presents today for follow up of breast cancer     The patient has a history of non-small-cell lung cancer, status post lobectomy in 2014.      She also has a history of endometrial cancer, and is status post surgical resection of that cancer as well.    More recently, the patient was found to have a left axillary lesion measuring 2.3 cm on PET-CT scan.  For some reason, I can not find an actual size measurement on the pathology report.  Multiple imaging studies of the breast were performed, but no primary lesion was found.  The malignant lesion was definitively classified as a lymph node.    Receptor staining showed that it was ER negative and HER2 Neu positive.  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    The patient was hospitalized from 7/22 to 10/09/22 while on treatment with paclitaxel  and trastuzumab  due to neutropenia and hypotension. She has also had electrolyte issues, with her magnesium  level being somewhat low.  Her Echo completed on 05/31/22 showed an Ejection Fraction of 60-65%. Her follow up Echo on 09/24/22 showed EF of 54% and Echo on 09/24/22 showed EF of 50-55%. Dr. Latisha Poland was notified of these results and he recommended holding Kinjinti and repeating Echo prior to her next dose of Kinjinti. She had a recent follow up echocardiogram completed on 01/23/23 which showed complete recovery.   In December 2024 she developed shingles on her left chest, around her left side and back. She completed Valacyclovir and was placed on Gabapentin  100 mg TID to help with the pain. She continues to have some pain, itching and burning in the affected area but otherwise recovering  well.      04/23/23:  Patient is here today for treatment with Kanjinti  for breast cancer.  She reports that she is doing well.  Her husband reports that he has noticed improvement in her.  She does still have weakness and uses a walker and wheelchair as needed to get around.  She has an echo scheduled for later today.    05/16/23:  Patient is here for follow up of breast cancer and treatment with Kanjinti .  She reports that she is having increased issues with heartburn.  This caused her throw up last night.  She denies any other new symptoms.  She is continued issues with pain from her shingles.        06/06/23:  Patient is here for follow up of breast cancer and treatment with Kanjinti .  She reports her heartburn has improved at this time.  She denies any other issues at this time.     07/18/2023:  Patient is here for follow up of breast cancer and treatment with Kanjinti .   She reports she has a catch in her hip but has been busy this week and more active than usual due to the death of her brother.   She does report diarrhea that is controlled with imodium.   She denies any chest pain or shortness of breath.       08/29/23:  Patient is here today for  follow up of Breast Cancer and treatment with Kanjinti .  Patient reports that she is doing well.  She has not new complaints or issues.  Family reports they feel that she is getting back to being herself more.     ROS:   Pertinent review of systems as discussed in HPI    HISTORY:  Past Medical History:   Diagnosis Date    Diabetes mellitus, type 2     Embolism (CMS HCC)     left leg    Endometrial cancer (CMS HCC)     HTN (hypertension)     Hx of breast cancer     Lung cancer     Macular degeneration (senile) of retina      Past Surgical History:   Procedure Laterality Date    HIP SURGERY Left     HX CHOLECYSTECTOMY      HX COLONOSCOPY      HX HYSTERECTOMY      HX LOBECTOMY Left     LUNG CANCER SURGERY      PORTACATH PLACEMENT       Social History     Socioeconomic  History    Marital status: Married     Spouse name: Not on file    Number of children: Not on file    Years of education: Not on file    Highest education level: Not on file   Occupational History    Not on file   Tobacco Use    Smoking status: Never    Smokeless tobacco: Never   Vaping Use    Vaping status: Never Used   Substance and Sexual Activity    Alcohol  use: Never    Drug use: Never    Sexual activity: Not Currently   Other Topics Concern    Not on file   Social History Narrative    Not on file     Social Determinants of Health     Financial Resource Strain: Medium Risk (01/29/2023)    Received from [facility]    Overall Financial Resource Strain (CARDIA)     Difficulty of Paying Living Expenses: Somewhat hard   Transportation Needs: No Transportation Needs (01/29/2023)    Received from [facility] - Transportation     Lack of Transportation (Medical): No     Lack of Transportation (Non-Medical): No   Social Connections: Moderately Integrated (01/29/2023)    Received from [facility]    Social Connection and Isolation Panel [NHANES]     Frequency of Communication with Friends and Family: More than three times a week     Frequency of Social Gatherings with Friends and Family: More than three times a week     Attends Religious Services: More than 4 times per year     Active Member of [employer] or Organizations: No     Attends Banker Meetings: Never     Marital Status: Married   Catering manager Violence: Not At Risk (01/29/2023)    Received from [employer], Afraid, Rape, and Kick questionnaire     Fear of Current or Ex-Partner: No     Emotionally Abused: No     Physically Abused: No     Sexually Abused: No   Housing Stability: Low Risk  (01/29/2023)    Received from [facility] Stability Vital Sign     Unable to Pay for Housing in the Last Year: No  Number of Times Moved in the Last Year: 1     Homeless in the Last Year: No     Family  Medical History:       Problem Relation (Age of Onset)    Cancer Brother            Current Outpatient Medications   Medication Sig    atorvastatin  (LIPITOR) 40 mg Oral Tablet Take 1 Tablet (40 mg total) by mouth Every evening for 30 days    carvediloL  (COREG ) 12.5 mg Oral Tablet Take 1 Tablet (12.5 mg total) by mouth Twice daily    cholecalciferol , vitamin D3, 1,250 mcg (50,000 unit) Oral Capsule Take 1 Capsule (50,000 Units total) by mouth Every 7 days    esomeprazole magnesium  (NEXIUM) 40 mg Oral Capsule, Delayed Release(E.C.) Take 1 Capsule (40 mg total) by mouth Every morning before breakfast    ezetimibe  (ZETIA ) 10 mg Oral Tablet Take 1 Tablet (10 mg total) by mouth Every evening    famotidine  (PEPCID ) 40 mg Oral Tablet Take 1 Tablet (40 mg total) by mouth Twice daily    gabapentin  (NEURONTIN ) 100 mg Oral Capsule Take 1 Capsule (100 mg total) by mouth Every night    lidocaine -prilocaine  (EMLA ) 2.5-2.5 % Cream One time    magnesium  oxide (MAG-OX) 400 mg Oral Tablet Take 1 Tablet (400 mg total) by mouth Twice daily    metFORMIN (GLUCOPHAGE XR) 500 mg Oral Tablet Sustained Release 24 hr Take 1 Tablet (500 mg total) by mouth Twice daily    multivitamin with iron Oral Tablet Take 1 Tablet by mouth Daily    ondansetron  (ZOFRAN  ODT) 4 mg Oral Tablet, Rapid Dissolve Take 1 Tablet (4 mg total) by mouth Every 8 hours as needed for Nausea/Vomiting Indications: prevent nausea and vomiting from cancer chemotherapy    ondansetron  (ZOFRAN ) 4 mg Oral Tablet Take 1 Tablet (4 mg total) by mouth Every 6 hours as needed for Nausea/Vomiting Indications: prevent nausea and vomiting from cancer chemotherapy    sertraline  (ZOLOFT ) 50 mg Oral Tablet Take 1 Tablet (50 mg total) by mouth Daily    traMADoL  (ULTRAM ) 50 mg Oral Tablet Take 1 Tablet (50 mg total) by mouth Every 4 hours as needed for Pain    vit C/E/Zn/coppr/lutein/zeaxan (PRESERVISION AREDS-2 ORAL) Take 1 Tablet by mouth Twice daily    XARELTO  20 mg Oral Tablet TAKE 1  TABLET BY MOUTH EVERY EVENING WITH DINNER     Allergies   Allergen Reactions    Adhesive Rash    Ceclor [Cefaclor]  Other Adverse Reaction (Add comment)     Pt states a Ceclor pill gave her blisters in her mouth.       PHYSICAL EXAM:  BP (!) 126/52 (Site: Left Arm, Patient Position: Sitting, Cuff Size: Adult)   Pulse 64   Temp 36.8 C (98.2 F) (Temporal)   Ht 1.626 m (5' 4)   Wt 89.1 kg (196 lb 6.4 oz)   SpO2 97%   BMI 33.71 kg/m        ECOG Status: (1) Restricted in physically strenuous activity, ambulatory and able to do work of light nature   Physical Exam  Vitals and nursing note reviewed.   Constitutional:       Appearance: Normal appearance.   HENT:      Head: Normocephalic.      Nose: Nose normal.      Mouth/Throat:      Mouth: Mucous membranes are moist.      Pharynx:  Oropharynx is clear.   Eyes:      General: No scleral icterus.     Extraocular Movements: Extraocular movements intact.   Cardiovascular:      Rate and Rhythm: Normal rate and regular rhythm.      Pulses: Normal pulses.      Heart sounds: Normal heart sounds.   Pulmonary:      Effort: Pulmonary effort is normal.      Breath sounds: Normal breath sounds.   Abdominal:      General: Bowel sounds are normal.      Palpations: Abdomen is soft.   Musculoskeletal:         General: Normal range of motion.      Cervical back: Normal range of motion and neck supple.   Skin:     General: Skin is warm and dry.   Neurological:      General: No focal deficit present.      Mental Status: She is alert and oriented to person, place, and time. Mental status is at baseline.   Psychiatric:         Mood and Affect: Mood normal.         Behavior: Behavior normal.         Thought Content: Thought content normal.         Judgment: Judgment normal.       ECHO:    EF VISUAL ESTIMATE   Date Value Ref Range Status   08/13/2023 65           DIAGNOSTIC DATA:  Results for orders placed during the hospital encounter of 01/23/23    PET ZO:XWRU (HEAD TO THIGH) WO IV  CONTRAST    Narrative  Daliya Delk    RADIOLOGIST: Baxter Limber    PET CT SUBSEQUENT: BODY (HEAD TO THIGH) WO IV CONTRAST performed on 01/23/2023 1:38 PM    CLINICAL HISTORY: C50.919: Breast cancer (CMS HCC).  BREAST CANCER/CURRENTLY UNDER GOING TREATMENT FOR LEFT BREAST CANCER/HX OF ENDOMETRIAL AND LUNG CA EXCISIONAL BIOPSY LEFT AXILLA 08/07/2022.    Subsequent Treatment Strategy.    TECHNIQUE:  F-18 fluorodeoxyglucose intravenously with PET-CT from the skull base to the proximal thighs  Uptake Time: 60 minutes  Serum Glucose: 111 mg/dL at the time of injection  SUV Measurement Type: SUVmax  SUV Normalization Method: Body weight    RADIOPHARMACEUTICAL: 12.4 millicuries F-18 FDG    COMPARISON: PET/CT from 07/04/2022. CT abdomen and pelvis from 01/05/2023.    FINDINGS:  Reference Background Values:  Mediastinal Blood Pool: 2.47 SUV max  Volumetric Normal Liver: 3.39 SUV max    HEAD AND NECK: No suspicious focal hypermetabolic activity. No hypermetabolic cervical or supraclavicular lymph nodes. No hypermetabolic parotid gland lesion is seen. No suspicious hypermetabolic activity about the pharynx.    CHEST: The previously seen hypermetabolic left axillary lesion-lymph node on PET/CT from 07/04/2022 is no longer identified. There are no current hypermetabolic mediastinal, hilar, internal mammary or axillary lymph nodes identified. No hypermetabolic lung lesion. No pleural effusion.    ABDOMEN AND PELVIS: No suspicious focal hypermetabolic activity is identified. No ascites. No hydronephrosis. Genitourinary activity appears physiologic. Left adrenal gland nodularity is unchanged. Stable borderline enlargement of the spleen. No convincing focal hypermetabolic activity involving the stomach to confirm gastritis or focal active inflammation.    The majority of the colon is again seen to be partially decompressed similar to CT from 01/05/2023 with some liquid stool again suggested within the colon which may be related to  diarrheal state. No suspicious focal increased hypermetabolic activity within the colon is seen. Generalized metabolic activity throughout the colon as well as rectum is present and may be physiologic. Please correlate with clinical history for possible degree of generalized nonspecific enteritis-colitis with possible diarrhea. These findings are also suggested on the prior CT from 01/31/23.    MUSCULOSKELETAL: Continued slight increase concavity of the L1 vertebral body inferior endplate is suggested on CT localizer images compatible with a compression fracture without bony retropulsion when compared to 2023/01/31. Some relatively low level metabolic activity essentially paralleling the L1 vertebral body inferior endplate is present without discrete bone lesion seen on CT localizer images.    No osseous destructive lesion is seen. Postsurgical hardware about the proximal left femur again partially visualized. Discogenic and degenerative arthritic changes of the spine again seen at multiple levels. No retroperitoneal hematoma. Psoas musculature is grossly symmetric.    Impression  1. L1 VERTEBRAL BODY MILD COMPRESSION FRACTURE DEFORMITY WITH CONCAVITY OF THE INFERIOR ENDPLATE WHICH MAY BE SLIGHTLY MORE PRONOUNCED WHEN COMPARED TO Jan 31, 2023. SOME METABOLIC ACTIVITY WITHIN THE VERTEBRAL BODY ALONG THE INFERIOR ENDPLATE IS PRESENT AND COULD BE RELATED TO RECENT FRACTURE. PLEASE CONSIDER MRI PREFERABLY WITHOUT AND WITH IV CONTRAST FOR FURTHER AND MORE DEFINITIVE EVALUATION OF THE LUMBAR SPINE PARTICULARLY THE L1 LEVEL AND HELP COMPLETELY EXCLUDE THE POSSIBILITY OF A PATHOLOGIC FRACTURE.    2. NO CURRENT SUSPICIOUS FOCAL HYPERMETABOLIC ACTIVITY INVOLVING THE SOFT TISSUES OF THE NECK, CHEST, ABDOMEN OR PELVIS TO SUGGEST MALIGNANCY-METASTASIS WHEN COMPARED TO 07/04/2022.    3. PREVIOUSLY SEEN HYPERMETABOLIC LEFT AXILLARY LESION-LYMPH NODE ON 07/04/2022 IS NO LONGER PRESENT.    4. PERSISTENT IMAGING FINDINGS SUGGESTING A  POSSIBLE DEGREE OF NONSPECIFIC ENTERITIS -COLITIS WHEN COMPARED TO CT FROM 2023/01/31. PLEASE CORRELATE WITH CLINICAL FINDINGS AND PATIENT HISTORY.        Radiologist location ID: [id number]         LABS:   CBC  Diff   Lab Results   Component Value Date/Time    WBC 3.4 (L) 08/29/2023 08:55 AM    HGB 11.1 08/29/2023 08:55 AM    HCT 33.0 08/29/2023 08:55 AM    PLTCNT 119 (L) 08/29/2023 08:55 AM    RBC 3.76 08/29/2023 08:55 AM    MCV 87.7 08/29/2023 08:55 AM    MCHC 33.6 08/29/2023 08:55 AM    MCH 29.4 08/29/2023 08:55 AM    RDW 14.3 08/29/2023 08:55 AM    MPV 9.2 08/29/2023 08:55 AM    Lab Results   Component Value Date/Time    PMNS 52 08/29/2023 08:55 AM    LYMPHOCYTES 36 08/29/2023 08:55 AM    EOSINOPHIL 2 08/29/2023 08:55 AM    MONOCYTES 9 08/29/2023 08:55 AM    BASOPHILS 1 08/29/2023 08:55 AM    BASOPHILS 0.00 08/29/2023 08:55 AM    PMNABS 1.80 (L) 08/29/2023 08:55 AM    LYMPHSABS 1.20 08/29/2023 08:55 AM    EOSABS 0.10 08/29/2023 08:55 AM    MONOSABS 0.30 08/29/2023 08:55 AM    BASABS 0.04 05/30/2022 05:40 AM            Comprehensive Metabolic Profile    Lab Results   Component Value Date    SODIUM 140 08/29/2023    POTASSIUM 4.1 08/29/2023    CHLORIDE 106 08/29/2023    CO2 30 08/29/2023    ANIONGAP 4 08/29/2023    BUN 16 08/29/2023    CREATININE 1.19 08/29/2023    ALBUMIN 3.4 (L) 08/29/2023  CALCIUM 10.0 08/29/2023    GLUCOSENF 166 (H) 08/29/2023    ALKPHOS 96 08/29/2023    ALT 18 08/29/2023    AST 28 08/29/2023    TOTBILIRUBIN 0.5 08/29/2023    TOTALPROTEIN 6.2 (L) 08/29/2023         ESTIMATED GFR   Date Value Ref Range Status   08/29/2023 46 (L) >59 mL/min/1.67m^2 Final     ASSESSMENT:      ICD-10-CM    1. Malignant neoplasm of left breast in female, estrogen receptor negative, unspecified site of breast (CMS HCC)  C50.912     Z17.1       2. Hypomagnesemia  E83.42                        PLAN:   1. Breast cancer:    Status post resection of an involved lymph node that was 2.3 cm on PET/CT.  No primary  lesion was seen on imaging studies.  T0 N1 M0, stage IIA.  ER negative, HER2 Neu positive.  After resection of the node, she was rendered cancer-free radiographically.  She is on treatment with adjuvant paclitaxel  and trastuzumab .  We had previously discussed the options of either no additional surgery and taking our chances versus radiation of the breast versus mastectomy.  Radiation to the axillary area will likely be required due to the lymph node that was known to be involved.  Patient has seen Dr. Asher Lawn and according to his note he would favor radiation if her Signatera was positive, however her Signatera was negative on 02/18/2023.    Her cardiac function is acceptable, and she will continue trastuzumab  adjuvant therapy. Her next ECHO is due around 11/13/23  I will have this scheduled and ordered today.  2. History of lung cancer:    Continue surveillance.    3. History of gynecologic cancer:   Continue surveillance as well.  4. Compression fracture:   The patient had a recent compression fracture after suffering a fall.  I do not have any reason to suspect that it was pathologic in nature.  We discussed use of Prolia  in order to prevent future skeletal related events.  She received her 1st dose of Prolia  on 02/12/23 and is scheduled for her next dose 08/20/23.    Prolia  note completed today.  Vitamin D 50000 units ordered weekly.  Hold on calcium as patient is elevated presently.   5. Shingles:   resolving, post Acyclovir and currently on Gabapentin .   6. Heartburn:   Improved with increase in Pepcid      Shantice Menger was given the chance to ask questions, and these were answered to their satisfaction. The patient is welcome to call with any questions or concerns in the meantime.     Return in about 7 weeks (around 10/17/2023).   Angela Kell APRN, FNP-BC, AOCNP, 08/29/2023 , 09:44   You can see your note(s) in [facility]. It is common for you to encounter certain medical terminology which may be unfamiliar to  you. You might see results before your provider does so please give at least 2 business days for review. Please have this understanding, that NOT all abnormal results are significant. Our office will contact you for any urgent or emergent action if necessary. If you have any questions or concerns, feel free to send a MyChart message or call the office. Please call with any new or concerning symptoms.   The patient's insurance company bears full legal and  financial responsibility resulting from any deviations that they cause to my recommended treatment plan.   CC:  Sherie Dine, MD  [address]  [place] [address]    Sherie Dine, MD  [address]  [place],  [place] [address]    This note was partially generated using M[employer] Fluency Direct system, and there may be some incorrect words, spellings, and punctuation that were not noted in checking the note before saving.

## 2023-09-01 ENCOUNTER — Other Ambulatory Visit (INDEPENDENT_AMBULATORY_CARE_PROVIDER_SITE_OTHER): Payer: Self-pay | Admitting: HEMATOLOGY-ONCOLOGY

## 2023-09-10 ENCOUNTER — Ambulatory Visit: Attending: RADIATION ONCOLOGY

## 2023-09-10 ENCOUNTER — Other Ambulatory Visit: Payer: Self-pay

## 2023-09-10 ENCOUNTER — Ambulatory Visit
Admission: RE | Admit: 2023-09-10 | Discharge: 2023-09-10 | Disposition: A | Discharge status: Still In Hospital | Source: Ambulatory Visit | Attending: RADIATION ONCOLOGY | Admitting: RADIATION ONCOLOGY

## 2023-09-10 DIAGNOSIS — Z923 Personal history of irradiation: Secondary | ICD-10-CM | POA: Insufficient documentation

## 2023-09-10 DIAGNOSIS — Z17411 Hormone receptor positive with human epidermal growth factor receptor 2 negative status: Secondary | ICD-10-CM

## 2023-09-10 DIAGNOSIS — Z08 Encounter for follow-up examination after completed treatment for malignant neoplasm: Secondary | ICD-10-CM | POA: Insufficient documentation

## 2023-09-10 DIAGNOSIS — C50912 Malignant neoplasm of unspecified site of left female breast: Secondary | ICD-10-CM

## 2023-09-10 DIAGNOSIS — Z853 Personal history of malignant neoplasm of breast: Secondary | ICD-10-CM | POA: Insufficient documentation

## 2023-09-10 DIAGNOSIS — G629 Polyneuropathy, unspecified: Secondary | ICD-10-CM | POA: Insufficient documentation

## 2023-09-10 DIAGNOSIS — Z8542 Personal history of malignant neoplasm of other parts of uterus: Secondary | ICD-10-CM | POA: Insufficient documentation

## 2023-09-10 DIAGNOSIS — Z79620 Long term (current) use of immunosuppressive biologic: Secondary | ICD-10-CM | POA: Insufficient documentation

## 2023-09-10 DIAGNOSIS — Z85118 Personal history of other malignant neoplasm of bronchus and lung: Secondary | ICD-10-CM | POA: Insufficient documentation

## 2023-09-10 NOTE — Progress Notes (Signed)
 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  She has completed adjuvant paclitaxel  times 12 weeks in his now continuing trastuzumab .       Assessment:  83 year old doing well continuing on trastuzumab  with no signs of recurrence on Signatera 3 months ago.  No new complaints but some continued unsteadiness of gait.    Oncology disease status: The patient has no clinical or biochemical evidence of cancer at this visit    Recommendations:  I will order a repeat Signatera at this time and follow up in 3 months.  She is instructed to call in the interim should she have any problems or concerns.    The indications, time course, benefits, risks and side effects of radiation treatment were explained to the patient, and her questions were answered to her apparent satisfaction. I encouraged her to contact us  at any time should she have any further questions or concerns. I personally saw and examined the patient, and reviewed all prior imaging and pathologic findings with her. I spent greater than 50% of a 30 minute visit in discussion of the patient's diagnosis and management.    FULL NOTE     Interval History :   Kelsey Preston is a 83 y.o. female with a history of 2 prior cancers in the distant past.  She had prior resection for a endometrial cancer as well as a primary lung cancer.  She has done well without evidence of recurrence of either of these cancers.  She was noted to have an enlarging axillary lymph node.  Testing including PET-CT showed uptake in the lymph node.  Biopsy was performed April of the 2nd that showed cancer.  Lymph node was resected May 21st.  Pathology showed 2.6 cm lymph node that was essentially entirely replaced by tumor.  Caris testing showed this to be ER negative PR  negative HER2 Neu positive.  She was seen by Dr. Moses in his completed course of paclitaxel  with trastuzumab .  She will now continue trismus pertuzumab for entire year.  She has had some difficulty with neuropathy.  She has had 2 falls resulting in hip fracture and compression fracture of her spine.     She returns today with no new complaints and a recent mammogram dated May 14, 2023 that shows normal findings.  Her last Signatera in March was negative.  She has no new breast complaints.  She continues use a walker and cane though she is feeling somewhat stronger.    Pain Assessment:  Musculoskeletal aches and pains    Past Medical/Surgical History:  Past Medical History:   Diagnosis Date    Diabetes mellitus, type 2     Embolism (CMS HCC)     left leg    Endometrial cancer (CMS HCC)     HTN (hypertension)     Hx of breast cancer     Lung cancer     Macular degeneration (senile) of retina          Past Surgical History:   Procedure Laterality Date    HIP SURGERY Left     HX CHOLECYSTECTOMY      HX COLONOSCOPY      HX HYSTERECTOMY  HX LOBECTOMY Left     LUNG CANCER SURGERY      PORTACATH PLACEMENT             Family History:   Family Medical History:       Problem Relation (Age of Onset)    Cancer Brother              Social History:   Social History     Socioeconomic History    Marital status: Married     Spouse name: Not on file    Number of children: Not on file    Years of education: Not on file    Highest education level: Not on file   Occupational History    Not on file   Tobacco Use    Smoking status: Never    Smokeless tobacco: Never   Vaping Use    Vaping status: Never Used   Substance and Sexual Activity    Alcohol  use: Never    Drug use: Never    Sexual activity: Not Currently   Other Topics Concern    Not on file   Social History Narrative    Not on file     Social Determinants of Health     Financial Resource Strain: Medium Risk (01/29/2023)    Received from [facility]    Overall  Financial Resource Strain (CARDIA)     Difficulty of Paying Living Expenses: Somewhat hard   Transportation Needs: No Transportation Needs (01/29/2023)    Received from [facility] - Transportation     Lack of Transportation (Medical): No     Lack of Transportation (Non-Medical): No   Social Connections: Moderately Integrated (01/29/2023)    Received from [facility]    Social Connection and Isolation Panel [NHANES]     Frequency of Communication with Friends and Family: More than three times a week     Frequency of Social Gatherings with Friends and Family: More than three times a week     Attends Religious Services: More than 4 times per year     Active Member of [employer] or Organizations: No     Attends Banker Meetings: Never     Marital Status: Married   Catering manager Violence: Not At Risk (01/29/2023)    Received from [employer], Afraid, Rape, and Kick questionnaire     Fear of Current or Ex-Partner: No     Emotionally Abused: No     Physically Abused: No     Sexually Abused: No   Housing Stability: Low Risk  (01/29/2023)    Received from [facility] Stability Vital Sign     Unable to Pay for Housing in the Last Year: No     Number of Times Moved in the Last Year: 1     Homeless in the Last Year: No       ALLERGIES:   Allergies   Allergen Reactions    Adhesive Rash    Ceclor [Cefaclor]  Other Adverse Reaction (Add comment)     Pt states a Ceclor pill gave her blisters in her mouth.        MEDICATIONS:   Current Outpatient Medications   Medication Instructions    atorvastatin  (LIPITOR) 40 mg, Oral, EVERY EVENING    carvediloL  (COREG ) 12.5 mg, 2 TIMES DAILY    cholecalciferol  (vitamin D3) 50,000 Units, Oral, EVERY 7 DAYS    esomeprazole magnesium  (  NEXIUM) 40 mg, EVERY MORNING BEFORE BREAKFAST    ezetimibe  (ZETIA ) 10 mg, EVERY EVENING    famotidine  (PEPCID ) 40 mg, Oral, 2 TIMES DAILY    gabapentin  (NEURONTIN ) 100 mg, Oral, NIGHTLY     lidocaine -prilocaine  (EMLA ) 2.5-2.5 % Cream ONCE    magnesium  oxide (MAG-OX) 400 mg, 2 TIMES DAILY    metFORMIN (GLUCOPHAGE XR) 500 mg, 2 TIMES DAILY    multivitamin with iron Oral Tablet 1 Tablet, Daily    ondansetron  (ZOFRAN  ODT) 4 mg, Oral, EVERY 8 HOURS PRN    ondansetron  (ZOFRAN ) 4 mg, Oral, EVERY 6 HOURS PRN    sertraline  (ZOLOFT ) 50 mg, Daily    traMADoL  (ULTRAM ) 50 mg, Oral, EVERY 4 HOURS PRN    vit C/E/Zn/coppr/lutein/zeaxan (PRESERVISION AREDS-2 ORAL) 1 Tablet, 2 TIMES DAILY    Xarelto  20 mg, Oral, EVERY EVENING AFTER DINNER        REVIEW OF SYSTEMS  Pertinent review of systems as discussed in Interval History.      Objective:     Vitals:    09/10/23 1427   BP: (!) 144/78   Pulse: 59   Weight: 86.2 kg (190 lb)             PHYSICAL EXAMINATION  Physical Exam  Constitutional:       Appearance: Normal appearance.   HENT:      Head: Normocephalic.   Eyes:      Extraocular Movements: Extraocular movements intact.      Pupils: Pupils are equal, round, and reactive to light.   Pulmonary:      Effort: Pulmonary effort is normal.   Musculoskeletal:         General: Normal range of motion.   Skin:     General: Skin is warm and dry.   Neurological:      General: No focal deficit present.      Mental Status: She is alert and oriented to person, place, and time.   Psychiatric:         Mood and Affect: Mood normal.         Behavior: Behavior normal.          LABS/IMAGING: All relevant labs and imaging were reviewed as per HPI.      Fairy Patten, MD 09/10/2023, 14:50    rr:[id number]@

## 2023-09-18 ENCOUNTER — Encounter (INDEPENDENT_AMBULATORY_CARE_PROVIDER_SITE_OTHER): Payer: Self-pay | Admitting: HEMATOLOGY-ONCOLOGY

## 2023-09-22 ENCOUNTER — Encounter (INDEPENDENT_AMBULATORY_CARE_PROVIDER_SITE_OTHER): Payer: Self-pay | Admitting: HEMATOLOGY-ONCOLOGY

## 2023-09-26 ENCOUNTER — Encounter (HOSPITAL_COMMUNITY): Payer: Self-pay

## 2023-09-26 ENCOUNTER — Other Ambulatory Visit: Payer: Self-pay

## 2023-09-26 ENCOUNTER — Other Ambulatory Visit (INDEPENDENT_AMBULATORY_CARE_PROVIDER_SITE_OTHER): Payer: Self-pay | Admitting: HEMATOLOGY-ONCOLOGY

## 2023-09-26 ENCOUNTER — Ambulatory Visit
Admission: RE | Admit: 2023-09-26 | Discharge: 2023-09-26 | Disposition: A | Discharge status: Home / Self Care | Source: Ambulatory Visit | Attending: HEMATOLOGY-ONCOLOGY | Admitting: HEMATOLOGY-ONCOLOGY

## 2023-09-26 VITALS — BP 96/82 | HR 78 | Temp 97.4°F | Resp 18 | Ht 64.0 in | Wt 187.6 lb

## 2023-09-26 DIAGNOSIS — C50919 Malignant neoplasm of unspecified site of unspecified female breast: Secondary | ICD-10-CM | POA: Insufficient documentation

## 2023-09-26 DIAGNOSIS — Z5112 Encounter for antineoplastic immunotherapy: Secondary | ICD-10-CM | POA: Insufficient documentation

## 2023-09-26 DIAGNOSIS — I82412 Acute embolism and thrombosis of left femoral vein: Secondary | ICD-10-CM | POA: Insufficient documentation

## 2023-09-26 LAB — COMPREHENSIVE METABOLIC PANEL, NON-FASTING
ALBUMIN/GLOBULIN RATIO: 1.2 (ref 0.8–1.4)
ALBUMIN: 3.5 g/dL (ref 3.5–5.7)
ALKALINE PHOSPHATASE: 102 U/L (ref 34–104)
ALT (SGPT): 16 U/L (ref 7–52)
ANION GAP: 4 mmol/L (ref 4–13)
AST (SGOT): 24 U/L (ref 13–39)
BILIRUBIN TOTAL: 0.6 mg/dL (ref 0.3–1.0)
BUN/CREA RATIO: 16 (ref 6–22)
BUN: 16 mg/dL (ref 7–25)
CALCIUM, CORRECTED: 9.9 mg/dL (ref 8.9–10.8)
CALCIUM: 9.5 mg/dL (ref 8.6–10.3)
CHLORIDE: 107 mmol/L (ref 98–107)
CO2 TOTAL: 28 mmol/L (ref 21–31)
CREATININE: 1 mg/dL (ref 0.60–1.30)
ESTIMATED GFR: 57 mL/min/1.73mˆ2 — ABNORMAL LOW (ref >59)
GLOBULIN: 2.9 (ref 2.0–3.5)
GLUCOSE: 107 mg/dL (ref 74–109)
OSMOLALITY, CALCULATED: 279 mosm/kg (ref 270–290)
POTASSIUM: 4.3 mmol/L (ref 3.5–5.1)
PROTEIN TOTAL: 6.4 g/dL (ref 6.4–8.9)
SODIUM: 139 mmol/L (ref 136–145)

## 2023-09-26 LAB — CBC WITH DIFF
BASOPHIL #: 0 x10ˆ3/uL (ref 0.00–0.10)
BASOPHIL %: 1 % (ref 0–1)
EOSINOPHIL #: 0.1 x10ˆ3/uL (ref 0.00–0.50)
EOSINOPHIL %: 2 % (ref 1–7)
HCT: 32.3 % (ref 31.2–41.9)
HGB: 10.9 g/dL (ref 10.9–14.3)
LYMPHOCYTE #: 1.4 x10ˆ3/uL (ref 1.10–3.10)
LYMPHOCYTE %: 34 % (ref 16–46)
MCH: 29.5 pg (ref 24.7–32.8)
MCHC: 33.8 g/dL (ref 32.3–35.6)
MCV: 87.3 fL (ref 75.5–95.3)
MONOCYTE #: 0.4 x10ˆ3/uL (ref 0.20–0.90)
MONOCYTE %: 9 % (ref 4–11)
MPV: 8.9 fL (ref 7.9–10.8)
NEUTROPHIL #: 2.2 x10ˆ3/uL (ref 1.90–8.20)
NEUTROPHIL %: 55 % (ref 43–77)
PLATELETS: 124 x10ˆ3/uL — ABNORMAL LOW (ref 140–440)
RBC: 3.7 x10ˆ6/uL (ref 3.63–4.92)
RDW: 14.4 % (ref 12.3–17.7)
WBC: 4.1 x10ˆ3/uL (ref 3.8–11.8)

## 2023-09-26 LAB — MAGNESIUM: MAGNESIUM: 2 mg/dL (ref 1.9–2.7)

## 2023-09-26 MED ORDER — MEPERIDINE (PF) 25 MG/ML INJECTION SOLUTION
12.5000 mg | Freq: Once | INTRAMUSCULAR | Status: DC | PRN
Start: 2023-09-26 — End: 2023-09-27

## 2023-09-26 MED ORDER — DEXTROSE 5% IN WATER (D5W) FLUSH BAG - 250 ML
INTRAVENOUS | Status: DC | PRN
Start: 2023-09-26 — End: 2023-09-27

## 2023-09-26 MED ORDER — TRASTUZUMAB-ANNS 150 MG INTRAVENOUS SOLUTION
6.0000 mg/kg | Freq: Once | INTRAVENOUS | Status: AC
Start: 2023-09-26 — End: 2023-09-26
  Administered 2023-09-26: 0 mg via INTRAVENOUS
  Administered 2023-09-26: 525 mg via INTRAVENOUS
  Filled 2023-09-26: qty 25

## 2023-09-26 MED ORDER — DIPHENHYDRAMINE 50 MG/ML INJECTION SOLUTION
25.0000 mg | Freq: Once | INTRAMUSCULAR | Status: DC | PRN
Start: 2023-09-26 — End: 2023-09-27

## 2023-09-26 MED ORDER — ALBUTEROL SULFATE 2.5 MG/3 ML (0.083 %) SOLUTION FOR NEBULIZATION
2.5000 mg | INHALATION_SOLUTION | Freq: Once | RESPIRATORY_TRACT | Status: DC | PRN
Start: 2023-09-26 — End: 2023-09-27

## 2023-09-26 MED ORDER — HYDROCORTISONE SOD SUCCINATE 100 MG/2 ML VIAL WRAPPER
100.0000 mg | Freq: Once | INTRAMUSCULAR | Status: DC | PRN
Start: 2023-09-26 — End: 2023-09-27

## 2023-09-26 MED ORDER — EPINEPHRINE 1 MG/ML (1 ML) INJECTION SOLUTION
0.3000 mg | Freq: Once | INTRAMUSCULAR | Status: DC | PRN
Start: 2023-09-26 — End: 2023-09-27

## 2023-09-26 MED ORDER — SODIUM CHLORIDE 0.9% FLUSH BAG - 250 ML
INTRAVENOUS | Status: DC | PRN
Start: 2023-09-26 — End: 2023-09-27

## 2023-09-26 MED ORDER — DIPHENHYDRAMINE 50 MG/ML INJECTION SOLUTION
50.0000 mg | Freq: Once | INTRAMUSCULAR | Status: DC | PRN
Start: 2023-09-26 — End: 2023-09-27

## 2023-09-26 MED ORDER — ALBUTEROL SULFATE HFA 90 MCG/ACTUATION AEROSOL INHALER - RN
2.0000 | Freq: Once | RESPIRATORY_TRACT | Status: DC | PRN
Start: 2023-09-26 — End: 2023-09-27

## 2023-09-26 MED ORDER — FAMOTIDINE (PF) 20 MG/2 ML INTRAVENOUS SOLUTION
20.0000 mg | Freq: Once | INTRAVENOUS | Status: DC | PRN
Start: 2023-09-26 — End: 2023-09-27

## 2023-09-26 NOTE — Nurses Notes (Signed)
 0953 Pt to OP Onc unit via wheelchair with dtr to assist. VS obtained. Port accessed with ease. Excellent blood return and flushes easily. Labs drawn from port and sent to lab via hospital tube system. Assessment complete. Rexene Lipps, RN  [phone] Lab results received and med released to pharmacy per order. Rexene Lipps, RN  [phone] Kanjinti  525 mg IV infusion started. Rexene Lipps, RN  [phone] Kanjinti  infusion completed. Line flushed with NS. Rexene Lipps, RN  [phone] VS obtained. Pt tolerated tx without difficulty. Rexene Lipps, RN  [phone] Port flushed with 30 ml NS. Olean d/c'ed. Site clear. Drsg applied. SABRA Rexene Lipps, RN  [phone] Pt taken to private vehicle by wheelchair with volunteer to assist. No adverse reaction noted. Rexene Lipps, RN

## 2023-10-09 ENCOUNTER — Encounter (INDEPENDENT_AMBULATORY_CARE_PROVIDER_SITE_OTHER): Payer: Self-pay | Admitting: HEMATOLOGY-ONCOLOGY

## 2023-10-14 ENCOUNTER — Other Ambulatory Visit (INDEPENDENT_AMBULATORY_CARE_PROVIDER_SITE_OTHER): Payer: Self-pay | Admitting: HEMATOLOGY-ONCOLOGY

## 2023-10-17 ENCOUNTER — Ambulatory Visit (INDEPENDENT_AMBULATORY_CARE_PROVIDER_SITE_OTHER)
Admission: RE | Admit: 2023-10-17 | Discharge: 2023-10-17 | Disposition: A | Discharge status: Home / Self Care | Payer: Self-pay | Source: Ambulatory Visit | Attending: NURSE PRACTITIONER | Admitting: NURSE PRACTITIONER

## 2023-10-17 ENCOUNTER — Ambulatory Visit
Admission: RE | Admit: 2023-10-17 | Discharge: 2023-10-17 | Disposition: A | Discharge status: Home / Self Care | Source: Ambulatory Visit | Attending: HEMATOLOGY-ONCOLOGY | Admitting: HEMATOLOGY-ONCOLOGY

## 2023-10-17 ENCOUNTER — Ambulatory Visit (INDEPENDENT_AMBULATORY_CARE_PROVIDER_SITE_OTHER): Payer: Self-pay | Admitting: NURSE PRACTITIONER

## 2023-10-17 ENCOUNTER — Encounter (INDEPENDENT_AMBULATORY_CARE_PROVIDER_SITE_OTHER): Payer: Self-pay | Admitting: NURSE PRACTITIONER

## 2023-10-17 ENCOUNTER — Encounter (HOSPITAL_COMMUNITY): Payer: Self-pay

## 2023-10-17 ENCOUNTER — Other Ambulatory Visit (INDEPENDENT_AMBULATORY_CARE_PROVIDER_SITE_OTHER): Payer: Self-pay | Admitting: NURSE PRACTITIONER

## 2023-10-17 ENCOUNTER — Other Ambulatory Visit: Payer: Self-pay

## 2023-10-17 ENCOUNTER — Encounter (INDEPENDENT_AMBULATORY_CARE_PROVIDER_SITE_OTHER): Payer: Self-pay | Admitting: HEMATOLOGY-ONCOLOGY

## 2023-10-17 VITALS — BP 128/76 | HR 61 | Temp 97.4°F | Resp 18

## 2023-10-17 DIAGNOSIS — T451X5A Adverse effect of antineoplastic and immunosuppressive drugs, initial encounter: Secondary | ICD-10-CM | POA: Insufficient documentation

## 2023-10-17 DIAGNOSIS — Z79620 Long term (current) use of immunosuppressive biologic: Secondary | ICD-10-CM | POA: Insufficient documentation

## 2023-10-17 DIAGNOSIS — C50919 Malignant neoplasm of unspecified site of unspecified female breast: Secondary | ICD-10-CM | POA: Insufficient documentation

## 2023-10-17 DIAGNOSIS — R197 Diarrhea, unspecified: Secondary | ICD-10-CM | POA: Insufficient documentation

## 2023-10-17 DIAGNOSIS — Z7901 Long term (current) use of anticoagulants: Secondary | ICD-10-CM | POA: Insufficient documentation

## 2023-10-17 DIAGNOSIS — C969 Malignant neoplasm of lymphoid, hematopoietic and related tissue, unspecified: Secondary | ICD-10-CM | POA: Insufficient documentation

## 2023-10-17 DIAGNOSIS — Z5181 Encounter for therapeutic drug level monitoring: Secondary | ICD-10-CM | POA: Insufficient documentation

## 2023-10-17 DIAGNOSIS — I82412 Acute embolism and thrombosis of left femoral vein: Secondary | ICD-10-CM | POA: Insufficient documentation

## 2023-10-17 DIAGNOSIS — R600 Localized edema: Secondary | ICD-10-CM

## 2023-10-17 DIAGNOSIS — Z86718 Personal history of other venous thrombosis and embolism: Secondary | ICD-10-CM

## 2023-10-17 DIAGNOSIS — Z5112 Encounter for antineoplastic immunotherapy: Secondary | ICD-10-CM | POA: Insufficient documentation

## 2023-10-17 DIAGNOSIS — Z79899 Other long term (current) drug therapy: Secondary | ICD-10-CM | POA: Insufficient documentation

## 2023-10-17 DIAGNOSIS — T887XXA Unspecified adverse effect of drug or medicament, initial encounter: Secondary | ICD-10-CM

## 2023-10-17 DIAGNOSIS — R110 Nausea: Secondary | ICD-10-CM | POA: Insufficient documentation

## 2023-10-17 LAB — CBC WITH DIFF
BASOPHIL #: 0 x10ˆ3/uL (ref 0.00–0.10)
BASOPHIL %: 1 % (ref 0–1)
EOSINOPHIL #: 0.1 x10ˆ3/uL (ref 0.00–0.50)
EOSINOPHIL %: 2 % (ref 1–7)
HCT: 33.9 % (ref 31.2–41.9)
HGB: 11.4 g/dL (ref 10.9–14.3)
LYMPHOCYTE #: 1.6 x10ˆ3/uL (ref 1.10–3.10)
LYMPHOCYTE %: 42 % (ref 16–46)
MCH: 29.2 pg (ref 24.7–32.8)
MCHC: 33.6 g/dL (ref 32.3–35.6)
MCV: 87 fL (ref 75.5–95.3)
MONOCYTE #: 0.3 x10ˆ3/uL (ref 0.20–0.90)
MONOCYTE %: 8 % (ref 4–11)
MPV: 8.6 fL (ref 7.9–10.8)
NEUTROPHIL #: 1.8 x10ˆ3/uL — ABNORMAL LOW (ref 1.90–8.20)
NEUTROPHIL %: 47 % (ref 43–77)
PLATELETS: 142 x10ˆ3/uL (ref 140–440)
RBC: 3.9 x10ˆ6/uL (ref 3.63–4.92)
RDW: 14.6 % (ref 12.3–17.7)
WBC: 3.8 x10ˆ3/uL (ref 3.8–11.8)

## 2023-10-17 LAB — COMPREHENSIVE METABOLIC PANEL, NON-FASTING
ALBUMIN/GLOBULIN RATIO: 1.3 (ref 0.8–1.4)
ALBUMIN: 3.7 g/dL (ref 3.5–5.7)
ALKALINE PHOSPHATASE: 93 U/L (ref 34–104)
ALT (SGPT): 17 U/L (ref 7–52)
ANION GAP: 4 mmol/L (ref 4–13)
AST (SGOT): 27 U/L (ref 13–39)
BILIRUBIN TOTAL: 0.6 mg/dL (ref 0.3–1.0)
BUN/CREA RATIO: 14 (ref 6–22)
BUN: 15 mg/dL (ref 7–25)
CALCIUM, CORRECTED: 10.1 mg/dL (ref 8.9–10.8)
CALCIUM: 9.9 mg/dL (ref 8.6–10.3)
CHLORIDE: 107 mmol/L (ref 98–107)
CO2 TOTAL: 29 mmol/L (ref 21–31)
CREATININE: 1.11 mg/dL (ref 0.60–1.30)
ESTIMATED GFR: 50 mL/min/1.73mˆ2 — ABNORMAL LOW (ref >59)
GLOBULIN: 2.9 (ref 2.0–3.5)
GLUCOSE: 133 mg/dL — ABNORMAL HIGH (ref 74–109)
OSMOLALITY, CALCULATED: 282 mosm/kg (ref 270–290)
POTASSIUM: 4.8 mmol/L (ref 3.5–5.1)
PROTEIN TOTAL: 6.6 g/dL (ref 6.4–8.9)
SODIUM: 140 mmol/L (ref 136–145)

## 2023-10-17 LAB — MAGNESIUM: MAGNESIUM: 2 mg/dL (ref 1.9–2.7)

## 2023-10-17 MED ORDER — FAMOTIDINE (PF) 20 MG/2 ML INTRAVENOUS SOLUTION
20.0000 mg | Freq: Once | INTRAVENOUS | Status: DC | PRN
Start: 2023-10-17 — End: 2023-10-18

## 2023-10-17 MED ORDER — ONDANSETRON HCL 4 MG TABLET
4.0000 mg | ORAL_TABLET | Freq: Four times a day (QID) | ORAL | 3 refills | Status: AC | PRN
Start: 2023-10-17 — End: ?

## 2023-10-17 MED ORDER — DEXTROSE 5% IN WATER (D5W) FLUSH BAG - 250 ML
INTRAVENOUS | Status: DC | PRN
Start: 2023-10-17 — End: 2023-10-18

## 2023-10-17 MED ORDER — ALBUTEROL SULFATE 2.5 MG/3 ML (0.083 %) SOLUTION FOR NEBULIZATION
2.5000 mg | INHALATION_SOLUTION | Freq: Once | RESPIRATORY_TRACT | Status: DC | PRN
Start: 2023-10-17 — End: 2023-10-18

## 2023-10-17 MED ORDER — TRASTUZUMAB-ANNS 150 MG INTRAVENOUS SOLUTION
6.0000 mg/kg | Freq: Once | INTRAVENOUS | Status: AC
Start: 2023-10-17 — End: 2023-10-17
  Administered 2023-10-17: 0 mg via INTRAVENOUS
  Administered 2023-10-17: 525 mg via INTRAVENOUS
  Filled 2023-10-17: qty 25

## 2023-10-17 MED ORDER — HYDROCORTISONE SOD SUCCINATE 100 MG/2 ML VIAL WRAPPER
100.0000 mg | Freq: Once | INTRAMUSCULAR | Status: DC | PRN
Start: 2023-10-17 — End: 2023-10-18

## 2023-10-17 MED ORDER — MEPERIDINE (PF) 25 MG/ML INJECTION SOLUTION
12.5000 mg | Freq: Once | INTRAMUSCULAR | Status: DC | PRN
Start: 2023-10-17 — End: 2023-10-18

## 2023-10-17 MED ORDER — ALBUTEROL SULFATE HFA 90 MCG/ACTUATION AEROSOL INHALER - RN
2.0000 | Freq: Once | RESPIRATORY_TRACT | Status: DC | PRN
Start: 2023-10-17 — End: 2023-10-18

## 2023-10-17 MED ORDER — DIPHENHYDRAMINE 50 MG/ML INJECTION SOLUTION
50.0000 mg | Freq: Once | INTRAMUSCULAR | Status: DC | PRN
Start: 2023-10-17 — End: 2023-10-18

## 2023-10-17 MED ORDER — SODIUM CHLORIDE 0.9% FLUSH BAG - 250 ML
INTRAVENOUS | Status: DC | PRN
Start: 2023-10-17 — End: 2023-10-18

## 2023-10-17 MED ORDER — EPINEPHRINE 1 MG/ML (1 ML) INJECTION SOLUTION
0.3000 mg | Freq: Once | INTRAMUSCULAR | Status: DC | PRN
Start: 2023-10-17 — End: 2023-10-18

## 2023-10-17 MED ORDER — DIPHENHYDRAMINE 50 MG/ML INJECTION SOLUTION
25.0000 mg | Freq: Once | INTRAMUSCULAR | Status: DC | PRN
Start: 2023-10-17 — End: 2023-10-18

## 2023-10-17 NOTE — Cancer Center Note (Signed)
 Kelsey Preston  [id number]  1941-02-01   10/17/2023       Department of Hematology/Oncology  Return Patient Visit           REFERRING PROVIDER:  Andra Pastor, MD  [address]  [place],  [place] [address]    PCP:  Pastor Andra, MD  [address]  [place] [address]          REASON FOR OFFICE VISIT:  Ongoing management and evaluation of  Breast Cancer, Lung cancer       HISTORY OF PRESENT ILLNESS:  History of Present Illness  Kelsey Preston is an 83 year old female with breast cancer who presents for a follow-up visit. She is with her Daughter Kelsey Preston and Daughter in [place] Kelsey Preston today.     Breast cancer treatment and associated symptoms  - Currently undergoing treatment with Kanjinti  for breast cancer, started July of previous year  - Initially received weekly doses, now transitioned to every three weeks  - Near completion of one year of Kanjinti  therapy  - Intermittent nausea since last visit, prefers non-dissolvable Zofran  tablets due to taste issues  - Diarrhea present, attributed to Kanjinti   - Occasional bright red blood in stool    Anticoagulation and thromboembolic history  - On Xarelto  for prior femoral clot and possible pulmonary embolism, diagnosed around the time of cancer diagnosis  - Clot discovered after experiencing chest pain and immobility due to cancer-related fatigue  - Inquires about discontinuing Xarelto ; cardiologist deferred decision to oncology team    Bone health and supplementation  - Receives Prolia  injections for bone density  - Advised to take vitamin D and calcium supplementation for bone health  - Questions necessity of continued vitamin D supplementation    Electrolyte management  - Takes two magnesium  supplements daily for low magnesium  levels  - Magnesium  levels monitored every three weeks    Constitutional and cardiopulmonary symptoms  - No chest pain, shortness of breath, fever, or chills          REVIEW OF SYSTEMS:    Past Medical History:   Diagnosis Date    Diabetes mellitus, type 2      Embolism (CMS HCC)     left leg    Endometrial cancer (CMS HCC)     HTN (hypertension)     Hx of breast cancer     Lung cancer     Macular degeneration (senile) of retina            Past Surgical History:   Procedure Laterality Date    HIP SURGERY Left     HX CHOLECYSTECTOMY      HX COLONOSCOPY      HX HYSTERECTOMY      HX LOBECTOMY Left     LUNG CANCER SURGERY      PORTACATH PLACEMENT             Social History     Socioeconomic History    Marital status: Married     Spouse name: Not on file    Number of children: Not on file    Years of education: Not on file    Highest education level: Not on file   Occupational History    Not on file   Tobacco Use    Smoking status: Never    Smokeless tobacco: Never   Vaping Use    Vaping status: Never Used   Substance and Sexual Activity    Alcohol  use: Never    Drug use:  Never    Sexual activity: Not Currently   Other Topics Concern    Not on file   Social History Narrative    Not on file     Social Determinants of Health     Financial Resource Strain: Medium Risk (01/29/2023)    Received [facility]    Overall Financial Resource Strain (CARDIA)     Difficulty of Paying Living Expenses: Somewhat hard   Transportation Needs: No Transportation Needs (01/29/2023)    Received from [facility] - Transportation     Lack of Transportation (Medical): No     Lack of Transportation (Non-Medical): No   Social Connections: Moderately Integrated (01/29/2023)    Received from [facility]    Social Connection and Isolation Panel [NHANES]     Frequency of Communication with Friends and Family: More than three times a week     Frequency of Social Gatherings with Friends and Family: More than three times a week     Attends Religious Services: More than 4 times per year     Active Member of [employer] or Organizations: No     Attends Banker Meetings: Never     Marital Status: Married   Catering manager Violence: Not At Risk (01/29/2023)    Received from  [employer], Afraid, Rape, and Kick questionnaire     Fear of Current or Ex-Partner: No     Emotionally Abused: No     Physically Abused: No     Sexually Abused: No   Housing Stability: Low Risk  (01/29/2023)    Received from [facility] Stability Vital Sign     Unable to Pay for Housing in the Last Year: No     Number of Times Moved in the Last Year: 1     Homeless in the Last Year: No       Social History     Social History Narrative    Not on file       Social History     Substance and Sexual Activity   Drug Use Never       Family Medical History:       Problem Relation (Age of Onset)    Cancer Brother              Current Outpatient Medications   Medication Sig    atorvastatin  (LIPITOR) 40 mg Oral Tablet Take 1 Tablet (40 mg total) by mouth Every evening for 30 days    carvediloL  (COREG ) 12.5 mg Oral Tablet Take 1 Tablet (12.5 mg total) by mouth Twice daily    cholecalciferol , vitamin D3, 1,250 mcg (50,000 unit) Oral Capsule Take 1 Capsule (50,000 Units total) by mouth Every 7 days    ergocalciferol, vitamin D2, (DRISDOL) 1,250 mcg (50,000 unit) Oral Capsule Take 1 Capsule (50,000 Units total) by mouth Every 7 days    esomeprazole magnesium  (NEXIUM) 40 mg Oral Capsule, Delayed Release(E.C.) Take 1 Capsule (40 mg total) by mouth Every morning before breakfast    ezetimibe  (ZETIA ) 10 mg Oral Tablet Take 1 Tablet (10 mg total) by mouth Every evening    famotidine  (PEPCID ) 40 mg Oral Tablet Take 1 Tablet (40 mg total) by mouth Twice daily    gabapentin  (NEURONTIN ) 100 mg Oral Capsule Take 1 Capsule (100 mg total) by mouth Every night    lidocaine -prilocaine  (EMLA ) 2.5-2.5 % Cream One time    magnesium  oxide (MAG-OX)  400 mg Oral Tablet Take 1 Tablet (400 mg total) by mouth Twice daily    metFORMIN (GLUCOPHAGE XR) 500 mg Oral Tablet Sustained Release 24 hr Take 1 Tablet (500 mg total) by mouth Twice daily    multivitamin with iron Oral Tablet Take 1 Tablet by mouth Daily     ondansetron  (ZOFRAN ) 4 mg Oral Tablet Take 1 Tablet (4 mg total) by mouth Every 6 hours as needed for Nausea/Vomiting Indications: prevent nausea and vomiting from cancer chemotherapy    sertraline  (ZOLOFT ) 50 mg Oral Tablet Take 1 Tablet (50 mg total) by mouth Daily    traMADoL  (ULTRAM ) 50 mg Oral Tablet Take 1 Tablet (50 mg total) by mouth Every 4 hours as needed for Pain    vit C/E/Zn/coppr/lutein/zeaxan (PRESERVISION AREDS-2 ORAL) Take 1 Tablet by mouth Twice daily    XARELTO  20 mg Oral Tablet TAKE 1 TABLET BY MOUTH EVERY EVENING WITH DINNER       Allergies[1]      PHYSICAL EXAM:  BP 123/71 (Site: Right Arm, Patient Position: Sitting, Cuff Size: Adult)   Pulse 64   Temp 36.7 C (98 F) (Temporal)   Ht 1.626 m (5' 4)   Wt 85.6 kg (188 lb 12.8 oz)   SpO2 96%   BMI 32.41 kg/m        ECOG Status: (0) Fully active, able to carry on all predisease performance without restriction   Physical Exam  NECK: No cervical lymphadenopathy. Thyroid  normal size.  CHEST: Lungs clear to auscultation bilaterally.  CARDIOVASCULAR: S1, S2, regular rhythm, no murmur.  BREAST: No axillary lymphadenopathy.  ABDOMEN: Abdomen soft.  EXTREMITIES: Trace edema in bilateral legs.          LABS:   Results  LABS  WBC: 3.8 (10/17/2023)  Hb: 11.4 (10/17/2023)  PLT: 142 (10/17/2023)  Signatera: Negative (09/10/2023)       ASSESSMENT:    ICD-10-CM    1. Hypomagnesemia  E83.42 MAGNESIUM       2. Acute deep vein thrombosis (DVT) of femoral vein of left lower extremity (CMS HCC)  I82.412 PERIPHERAL VENOUS DUPLEX - LOWER           Assessment & Plan  Breast cancer with lymph node involvement on active therapy  Breast cancer with lymph node involvement, currently on Kanjinti  therapy. Treatment started in July of last year with initial weekly doses followed by Q3 week dosing. Today marks the completion of one year of therapy. No signs of active disease currently. Discussion on the absence of a primary tumor and the possibility of the body fighting  off the original site before lymph node involvement.  - Administer final dose of Kanjinti  today  - Schedule follow-up in six weeks for regular check-up and labs  - Plan for chest, abdomen, and pelvis scan at next visit to ensure no hidden disease    Nausea and diarrhea secondary to cancer therapy and magnesium  supplementation  Intermittent nausea and diarrhea, likely secondary to Kanjinti  and magnesium  supplementation. Nausea managed with Zofran , but she reports adverse reaction to dissolvable form due to taste. Diarrhea may improve after cessation of Conjenty.  - Prescribe Zofran  tablets instead of dissolvable form  - Monitor diarrhea post-Kanjinti  cessation    Hypomagnesemia on supplementation  Hypomagnesemia requiring supplementation. Magnesium  levels have been low, with recent improvement noted. Diarrhea from magnesium  supplementation is a concern, but excess magnesium  is excreted in stool, reducing risk of hypermagnesemia.  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 Currently on Xarelto . Cancer treatment increases risk of clotting, necessitating continued anticoagulation until treatment completion and rescan confirmation of clot resolution.  - Schedule ultrasound of left leg to assess for residual clot  - Continue Xarelto  until ultrasound results are available    Vitamin D supplementation for bone health with Prolia  therapy  Vitamin D supplementation initiated to support bone health in conjunction with Prolia  therapy. Required for insurance approval of Prolia  due to its role in bone metabolism alongside calcium.  - Continue weekly vitamin D supplementation      Lubna Stegeman was given the chance to ask questions, and these were answered to their satisfaction. The patient  is welcome to call with any questions or concerns in the meantime.     This note was created using [employer] M*Modal Fluency Align mobile application via conversational audio. Consent for audio recording was obtained by patient/family members prior to recording.      Randine Clara APRN, FNP-BC, AOCNP, 10/17/2023 , 09:59       This note was partially generated using MModal Fluency Direct system, and there may be some incorrect words, spellings, and punctuation that were not noted in checking the note before saving.   You can see your note(s) in [facility]. It is common for you to encounter certain medical terminology which may be unfamiliar to you. You might see results before your provider does so please give at least 2 business days for review. Please have this understanding, that NOT all abnormal results are significant. Our office will contact you for any urgent or emergent action if necessary. If you have any questions or concerns, feel free to send a MyChart message or call the office. Please call with any new or concerning symptoms.     CC:  Manus Endo, MD  [address]  [place] [address]                           [1]   Allergies  Allergen Reactions    Adhesive Rash    Ceclor [Cefaclor]  Other Adverse Reaction (Add comment)     Pt states a Ceclor pill gave her blisters in her mouth.

## 2023-10-17 NOTE — Nurses Notes (Signed)
 1010-1030: Arrived to outpatient oncology via wheelchair with family. Here today for Kanjinti  infusion. Port to right chest accessed without difficulty. Excellent blood return noted. Flushes easily. Denies all complaints at present. Delon Larve, RN  Patient Assessment/Symptom Management Patient Has No MD Appointment Today   Key: (+) Symptom present           (-)  Symptom not present If Symptom is Positive(+) a Nursing Note is required   Edema +   Uncontrolled Nausea -   Vomiting -   Inability to eat/drink -   Mouth Sores -   Diarrhea -   Constipation (? Last BM) -   Fatigue that interferes with ADL's -   Numbness/Tingling -change -   Other -   Fever/Signs & Symptoms of infection -   Nurse Initials JT     1104: Kanjinti  infusion started at this time. Delon Larve, RN  1134: Kanjinti  infusion complete and line flushing. Delon Larve, RN  1155: Flush complete. No signs or symptoms of reaction noted. Delon Larve, RN  1200: Port flushed and deaccessed per protocol. Site clear. Delon Larve, RN  1210: Out of department at this time via wheelchair. No changes noted. Delon Larve, RN

## 2023-10-17 NOTE — Addendum Note (Signed)
 Addended by: MARGET GRATE on: 10/17/2023 10:48 AM     Modules accepted: Orders

## 2023-10-21 ENCOUNTER — Encounter (INDEPENDENT_AMBULATORY_CARE_PROVIDER_SITE_OTHER): Payer: Self-pay | Admitting: HEMATOLOGY-ONCOLOGY

## 2023-10-21 ENCOUNTER — Other Ambulatory Visit (INDEPENDENT_AMBULATORY_CARE_PROVIDER_SITE_OTHER): Payer: Self-pay | Admitting: NURSE PRACTITIONER

## 2023-10-28 ENCOUNTER — Encounter (INDEPENDENT_AMBULATORY_CARE_PROVIDER_SITE_OTHER): Payer: Self-pay | Admitting: HEMATOLOGY-ONCOLOGY

## 2023-10-31 ENCOUNTER — Other Ambulatory Visit (INDEPENDENT_AMBULATORY_CARE_PROVIDER_SITE_OTHER): Payer: Self-pay | Admitting: HEMATOLOGY-ONCOLOGY

## 2023-11-04 ENCOUNTER — Encounter (INDEPENDENT_AMBULATORY_CARE_PROVIDER_SITE_OTHER): Payer: Self-pay | Admitting: HEMATOLOGY-ONCOLOGY

## 2023-11-05 ENCOUNTER — Ambulatory Visit (INDEPENDENT_AMBULATORY_CARE_PROVIDER_SITE_OTHER): Payer: Self-pay | Admitting: NURSE PRACTITIONER

## 2023-11-05 ENCOUNTER — Inpatient Hospital Stay (INDEPENDENT_AMBULATORY_CARE_PROVIDER_SITE_OTHER)
Admission: RE | Admit: 2023-11-05 | Discharge: 2023-11-05 | Discharge status: Home / Self Care | Payer: Self-pay | Source: Ambulatory Visit | Attending: NURSE PRACTITIONER

## 2023-11-05 ENCOUNTER — Other Ambulatory Visit: Payer: Self-pay

## 2023-11-05 DIAGNOSIS — I82412 Acute embolism and thrombosis of left femoral vein: Secondary | ICD-10-CM

## 2023-11-05 DIAGNOSIS — I82402 Acute embolism and thrombosis of unspecified deep veins of left lower extremity: Secondary | ICD-10-CM

## 2023-11-05 NOTE — Result Encounter Note (Signed)
 Attempted to call patient and advise her of result of Ultrasound.  No answer.  Message left to return call.  Patient ultrasound shows no DVT she may stop her Eliquis.     Randine Clara APRN, FNP-BC, AOCNP, 11/05/2023 , 16:40

## 2023-11-06 ENCOUNTER — Encounter (INDEPENDENT_AMBULATORY_CARE_PROVIDER_SITE_OTHER): Payer: Self-pay | Admitting: HEMATOLOGY-ONCOLOGY

## 2023-11-06 ENCOUNTER — Telehealth (INDEPENDENT_AMBULATORY_CARE_PROVIDER_SITE_OTHER): Payer: Self-pay | Admitting: NURSE PRACTITIONER

## 2023-11-06 NOTE — Telephone Encounter (Signed)
 Called and left message to return call about her ultrasound.  She can stop her Eliquis as her Ultrasound is clear of clots.     Randine Clara APRN, FNP-BC, AOCNP, 11/06/2023 , 15:07

## 2023-11-07 ENCOUNTER — Ambulatory Visit (INDEPENDENT_AMBULATORY_CARE_PROVIDER_SITE_OTHER)
Admission: RE | Admit: 2023-11-07 | Discharge: 2023-11-07 | Disposition: A | Discharge status: Home / Self Care | Payer: Self-pay | Source: Ambulatory Visit | Attending: NURSE PRACTITIONER

## 2023-11-07 ENCOUNTER — Ambulatory Visit

## 2023-11-07 ENCOUNTER — Encounter (HOSPITAL_COMMUNITY): Payer: Self-pay

## 2023-11-07 ENCOUNTER — Other Ambulatory Visit: Payer: Self-pay

## 2023-11-07 ENCOUNTER — Telehealth (INDEPENDENT_AMBULATORY_CARE_PROVIDER_SITE_OTHER): Payer: Self-pay | Admitting: HEMATOLOGY-ONCOLOGY

## 2023-11-07 ENCOUNTER — Encounter (INDEPENDENT_AMBULATORY_CARE_PROVIDER_SITE_OTHER): Payer: Self-pay | Admitting: HEMATOLOGY-ONCOLOGY

## 2023-11-07 ENCOUNTER — Ambulatory Visit (HOSPITAL_BASED_OUTPATIENT_CLINIC_OR_DEPARTMENT_OTHER): Payer: Self-pay | Admitting: HEMATOLOGY-ONCOLOGY

## 2023-11-07 ENCOUNTER — Ambulatory Visit
Admission: RE | Admit: 2023-11-07 | Discharge: 2023-11-07 | Disposition: A | Discharge status: Home / Self Care | Payer: Self-pay | Source: Ambulatory Visit | Attending: HEMATOLOGY-ONCOLOGY | Admitting: HEMATOLOGY-ONCOLOGY

## 2023-11-07 VITALS — BP 123/71 | HR 59 | Temp 98.0°F | Ht 64.0 in | Wt 185.8 lb

## 2023-11-07 VITALS — BP 121/72 | HR 62 | Temp 97.2°F | Resp 19

## 2023-11-07 DIAGNOSIS — I82412 Acute embolism and thrombosis of left femoral vein: Secondary | ICD-10-CM | POA: Insufficient documentation

## 2023-11-07 DIAGNOSIS — C50919 Malignant neoplasm of unspecified site of unspecified female breast: Secondary | ICD-10-CM

## 2023-11-07 DIAGNOSIS — Z7901 Long term (current) use of anticoagulants: Secondary | ICD-10-CM | POA: Insufficient documentation

## 2023-11-07 DIAGNOSIS — I82409 Acute embolism and thrombosis of unspecified deep veins of unspecified lower extremity: Secondary | ICD-10-CM | POA: Insufficient documentation

## 2023-11-07 DIAGNOSIS — Z1731 Human epidermal growth factor receptor 2 positive status: Secondary | ICD-10-CM | POA: Insufficient documentation

## 2023-11-07 DIAGNOSIS — Z9889 Other specified postprocedural states: Secondary | ICD-10-CM | POA: Insufficient documentation

## 2023-11-07 DIAGNOSIS — Z5112 Encounter for antineoplastic immunotherapy: Secondary | ICD-10-CM | POA: Insufficient documentation

## 2023-11-07 DIAGNOSIS — Z85118 Personal history of other malignant neoplasm of bronchus and lung: Secondary | ICD-10-CM

## 2023-11-07 DIAGNOSIS — Z171 Estrogen receptor negative status [ER-]: Secondary | ICD-10-CM | POA: Insufficient documentation

## 2023-11-07 DIAGNOSIS — Z79620 Long term (current) use of immunosuppressive biologic: Secondary | ICD-10-CM | POA: Insufficient documentation

## 2023-11-07 DIAGNOSIS — Z902 Acquired absence of lung [part of]: Secondary | ICD-10-CM | POA: Insufficient documentation

## 2023-11-07 DIAGNOSIS — C773 Secondary and unspecified malignant neoplasm of axilla and upper limb lymph nodes: Secondary | ICD-10-CM | POA: Insufficient documentation

## 2023-11-07 LAB — CBC WITH DIFF
BASOPHIL #: 0 x10ˆ3/uL (ref 0.00–0.10)
BASOPHIL %: 1 % (ref 0–1)
EOSINOPHIL #: 0.1 x10ˆ3/uL (ref 0.00–0.50)
EOSINOPHIL %: 3 % (ref 1–7)
HCT: 34.1 % (ref 31.2–41.9)
HGB: 11.5 g/dL (ref 10.9–14.3)
LYMPHOCYTE #: 1.3 x10ˆ3/uL (ref 1.10–3.10)
LYMPHOCYTE %: 39 % (ref 16–46)
MCH: 29 pg (ref 24.7–32.8)
MCHC: 33.7 g/dL (ref 32.3–35.6)
MCV: 86.3 fL (ref 75.5–95.3)
MONOCYTE #: 0.3 x10ˆ3/uL (ref 0.20–0.90)
MONOCYTE %: 10 % (ref 4–11)
MPV: 8.8 fL (ref 7.9–10.8)
NEUTROPHIL #: 1.6 x10ˆ3/uL — ABNORMAL LOW (ref 1.90–8.20)
NEUTROPHIL %: 48 % (ref 43–77)
PLATELETS: 116 x10ˆ3/uL — ABNORMAL LOW (ref 140–440)
RBC: 3.95 x10ˆ6/uL (ref 3.63–4.92)
RDW: 14.9 % (ref 12.3–17.7)
WBC: 3.3 x10ˆ3/uL — ABNORMAL LOW (ref 3.8–11.8)

## 2023-11-07 LAB — MAGNESIUM: MAGNESIUM: 1.7 mg/dL — ABNORMAL LOW (ref 1.9–2.7)

## 2023-11-07 LAB — COMPREHENSIVE METABOLIC PANEL, NON-FASTING
ALBUMIN/GLOBULIN RATIO: 1.2 (ref 0.8–1.4)
ALBUMIN: 3.6 g/dL (ref 3.5–5.7)
ALKALINE PHOSPHATASE: 79 U/L (ref 34–104)
ALT (SGPT): 19 U/L (ref 7–52)
ANION GAP: 1 mmol/L — ABNORMAL LOW (ref 4–13)
AST (SGOT): 27 U/L (ref 13–39)
BILIRUBIN TOTAL: 0.7 mg/dL (ref 0.3–1.0)
BUN/CREA RATIO: 13 (ref 6–22)
BUN: 13 mg/dL (ref 7–25)
CALCIUM, CORRECTED: 10.1 mg/dL (ref 8.9–10.8)
CALCIUM: 9.8 mg/dL (ref 8.6–10.3)
CHLORIDE: 109 mmol/L — ABNORMAL HIGH (ref 98–107)
CO2 TOTAL: 29 mmol/L (ref 21–31)
CREATININE: 1.04 mg/dL (ref 0.60–1.30)
ESTIMATED GFR: 54 mL/min/1.73mˆ2 — ABNORMAL LOW (ref >59)
GLOBULIN: 2.9 (ref 2.0–3.5)
GLUCOSE: 99 mg/dL (ref 74–109)
OSMOLALITY, CALCULATED: 278 mosm/kg (ref 270–290)
POTASSIUM: 4.3 mmol/L (ref 3.5–5.1)
PROTEIN TOTAL: 6.5 g/dL (ref 6.4–8.9)
SODIUM: 139 mmol/L (ref 136–145)

## 2023-11-07 MED ORDER — ALBUTEROL SULFATE 2.5 MG/3 ML (0.083 %) SOLUTION FOR NEBULIZATION
2.5000 mg | INHALATION_SOLUTION | Freq: Once | RESPIRATORY_TRACT | Status: DC | PRN
Start: 2023-11-07 — End: 2023-11-08

## 2023-11-07 MED ORDER — MEPERIDINE (PF) 25 MG/ML INJECTION SOLUTION
12.5000 mg | Freq: Once | INTRAMUSCULAR | Status: DC | PRN
Start: 2023-11-07 — End: 2023-11-08

## 2023-11-07 MED ORDER — DIPHENHYDRAMINE 50 MG/ML INJECTION SOLUTION
50.0000 mg | Freq: Once | INTRAMUSCULAR | Status: DC | PRN
Start: 2023-11-07 — End: 2023-11-08

## 2023-11-07 MED ORDER — TRASTUZUMAB-ANNS 150 MG INTRAVENOUS SOLUTION
6.0000 mg/kg | Freq: Once | INTRAVENOUS | Status: AC
Start: 2023-11-07 — End: 2023-11-07
  Administered 2023-11-07: 0 mg via INTRAVENOUS
  Administered 2023-11-07: 525 mg via INTRAVENOUS
  Filled 2023-11-07: qty 25

## 2023-11-07 MED ORDER — SODIUM CHLORIDE 0.9% FLUSH BAG - 250 ML
INTRAVENOUS | Status: DC | PRN
Start: 2023-11-07 — End: 2023-11-08

## 2023-11-07 MED ORDER — MAGNESIUM SULFATE 1 GRAM/100 ML IN DEXTROSE 5 % INTRAVENOUS PIGGYBACK
1.0000 g | INJECTION | INTRAVENOUS | Status: AC
Start: 2023-11-07 — End: 2023-11-07
  Administered 2023-11-07: 0 g via INTRAVENOUS
  Administered 2023-11-07 (×2): 1 g via INTRAVENOUS
  Administered 2023-11-07: 0 g via INTRAVENOUS
  Filled 2023-11-07 (×2): qty 100

## 2023-11-07 MED ORDER — FAMOTIDINE (PF) 20 MG/2 ML INTRAVENOUS SOLUTION
20.0000 mg | Freq: Once | INTRAVENOUS | Status: DC | PRN
Start: 2023-11-07 — End: 2023-11-08

## 2023-11-07 MED ORDER — DIPHENHYDRAMINE 50 MG/ML INJECTION SOLUTION
25.0000 mg | Freq: Once | INTRAMUSCULAR | Status: DC | PRN
Start: 2023-11-07 — End: 2023-11-08

## 2023-11-07 MED ORDER — EPINEPHRINE 1 MG/ML (1 ML) INJECTION SOLUTION
0.3000 mg | Freq: Once | INTRAMUSCULAR | Status: DC | PRN
Start: 2023-11-07 — End: 2023-11-08

## 2023-11-07 MED ORDER — DEXTROSE 5% IN WATER (D5W) FLUSH BAG - 250 ML
INTRAVENOUS | Status: DC | PRN
Start: 2023-11-07 — End: 2023-11-08

## 2023-11-07 MED ORDER — HYDROCORTISONE SOD SUCCINATE 100 MG/2 ML VIAL WRAPPER
100.0000 mg | Freq: Once | INTRAMUSCULAR | Status: DC | PRN
Start: 2023-11-07 — End: 2023-11-08

## 2023-11-07 MED ORDER — ALBUTEROL SULFATE HFA 90 MCG/ACTUATION AEROSOL INHALER - RN
2.0000 | Freq: Once | RESPIRATORY_TRACT | Status: DC | PRN
Start: 2023-11-07 — End: 2023-11-08

## 2023-11-07 NOTE — Nursing Note (Signed)
 Patient to get treatment today. Went over labs. All questions answered. Patient had ultrasound to check for clots. Patient can stop blood thinners. Return in 3 weeks.

## 2023-11-07 NOTE — Progress Notes (Unsigned)
 Department of Hematology/Oncology  Progress Note   Name: Kelsey Preston  FMW:[id number]  Date of Birth: 1940/07/15  Encounter Date: 11/07/2023    REFERRING PROVIDER:  Andra Pastor, MD  [address]  [place],  [place] [address]    REASON FOR OFFICE VISIT:  Breast Cancer     HISTORY OF PRESENT ILLNESS:  Kelsey Preston is a 83 y.o. female who presents today for follow up of breast cancer     The patient has a history of non-small-cell lung cancer, status post lobectomy in 2014.      She also has a history of endometrial cancer, and is status post surgical resection of that cancer as well.    More recently, the patient was found to have a left axillary lesion measuring 2.3 cm on PET-CT scan.  For some reason, I can not find an actual size measurement on the pathology report.  Multiple imaging studies of the breast were performed, but no primary lesion was found.  The malignant lesion was definitively classified as a lymph node.    Receptor staining showed that it was ER negative and HER2 Neu positive.  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    09/10/2022: The patient is here for follow up of early stage breast cancer.  She is doing well and denies any new problems at this time.    09/27/2022: The patient is here for follow up breast cancer.  She is doing well at this time and has no new complaints.    10/18/2022: the patient is here for follow up of breast cancer. She has been on treatment with paclitaxel  and trastuzumab . She has been hospitalized since the last visit due to neutropenia and hypotension. She has also had electrolyte issues, with her magnesium  level being somewhat low. She is taking over-the-counter magnesium  oxide.    11/08/2022: The patient is here for follow up of breast cancer.  She has been on treatment with weekly paclitaxel  and trastuzumab .  She did end up in the hospital relatively recently with some low cell counts, but it appears to have resolved.    12/06/2022: The patient is here for follow up  of breast cancer.  She has been doing relatively well but has noticed some peripheral neuropathy.    02/11/2023: The patient is here for follow up of breast cancer.  She was on trastuzumab , and it was held due to a percentage decline in her ejection fraction.  She had a recent follow up echocardiogram which showed complete recovery.    11/07/2023: The patient is here for follow up of breast cancer.  She has a couple of treatments of trastuzumab  remaining.  She denies any problems at this time.  She is inquiring about being able to discontinue her anticoagulant because she had a recent lower extremity Doppler that showed no evidence of residual clot.    ROS:   Pertinent review of systems as discussed in HPI    HISTORY:  Past Medical History:   Diagnosis Date    Diabetes mellitus, type 2     Embolism (CMS HCC)     left leg    Endometrial cancer (CMS HCC)     HTN (hypertension)     Hx of breast cancer     Lung cancer     Macular degeneration (senile) of retina          Past Surgical History:   Procedure Laterality Date    HIP SURGERY Left     HX  CHOLECYSTECTOMY      HX COLONOSCOPY      HX HYSTERECTOMY      HX LOBECTOMY Left     LUNG CANCER SURGERY      PORTACATH PLACEMENT           Social History     Socioeconomic History    Marital status: Married     Spouse name: Not on file    Number of children: Not on file    Years of education: Not on file    Highest education level: Not on file   Occupational History    Not on file   Tobacco Use    Smoking status: Never    Smokeless tobacco: Never   Vaping Use    Vaping status: Never Used   Substance and Sexual Activity    Alcohol  use: Never    Drug use: Never    Sexual activity: Not Currently   Other Topics Concern    Not on file   Social History Narrative    Not on file     Social Determinants of Health     Financial Resource Strain: Medium Risk (01/29/2023)    [facility]    Overall Financial Resource Strain (CARDIA)     Difficulty of Paying Living Expenses:  Somewhat hard   Transportation Needs: No Transportation Needs (01/29/2023)    Received from [facility] - [facility]     Lack of Transportation (Medical): No     Lack of Transportation (Non-Medical): No   Social Connections: Moderately Integrated (01/29/2023)    Received from [facility]    Social Connection and Isolation Panel [NHANES]     Frequency of Communication with Friends and Family: More than three times a week     Frequency of Social Gatherings with Friends and Family: More than three times a week     Attends Religious Services: More than 4 times per year     Active Member of [employer] or Organizations: No     Attends Banker Meetings: Never     Marital Status: Married   Catering manager Violence: Not At Risk (01/29/2023)    Received from [employer], Afraid, Rape, and Kick questionnaire     Fear of Current or Ex-Partner: No     Emotionally Abused: No     Physically Abused: No     Sexually Abused: No   Housing Stability: Low Risk  (01/29/2023)    Received from [facility] Stability Vital Sign     Unable to Pay for Housing in the Last Year: No     Number of Times Moved in the Last Year: 1     Homeless in the Last Year: No     Family Medical History:       Problem Relation (Age of Onset)    Cancer Brother            Current Outpatient Medications   Medication Sig    atorvastatin  (LIPITOR) 40 mg Oral Tablet Take 1 Tablet (40 mg total) by mouth Every evening for 30 days    carvediloL  (COREG ) 12.5 mg Oral Tablet Take 1 Tablet (12.5 mg total) by mouth Twice daily    cholecalciferol , vitamin D3, 1,250 mcg (50,000 unit) Oral Capsule Take 1 Capsule (50,000 Units total) by mouth Every 7 days    ergocalciferol, vitamin D2, (DRISDOL) 1,250 mcg (50,000 unit) Oral Capsule Take 1 Capsule (  50,000 Units total) by mouth Every 7 days    esomeprazole magnesium  (NEXIUM) 40 mg Oral Capsule, Delayed Release(E.C.) Take 1 Capsule (40 mg total) by mouth Every morning  before breakfast    ezetimibe  (ZETIA ) 10 mg Oral Tablet Take 1 Tablet (10 mg total) by mouth Every evening    famotidine  (PEPCID ) 40 mg Oral Tablet Take 1 Tablet (40 mg total) by mouth Twice daily    gabapentin  (NEURONTIN ) 100 mg Oral Capsule Take 1 Capsule (100 mg total) by mouth Every night    lidocaine -prilocaine  (EMLA ) 2.5-2.5 % Cream One time    magnesium  oxide (MAG-OX) 400 mg Oral Tablet Take 1 Tablet (400 mg total) by mouth Twice daily    metFORMIN (GLUCOPHAGE XR) 500 mg Oral Tablet Sustained Release 24 hr Take 1 Tablet (500 mg total) by mouth Twice daily    multivitamin with iron Oral Tablet Take 1 Tablet by mouth Daily    ondansetron  (ZOFRAN ) 4 mg Oral Tablet Take 1 Tablet (4 mg total) by mouth Every 6 hours as needed for Nausea/Vomiting Indications: prevent nausea and vomiting from cancer chemotherapy    sertraline  (ZOLOFT ) 50 mg Oral Tablet Take 1 Tablet (50 mg total) by mouth Daily    traMADoL  (ULTRAM ) 50 mg Oral Tablet Take 1 Tablet (50 mg total) by mouth Every 4 hours as needed for Pain    vit C/E/Zn/coppr/lutein/zeaxan (PRESERVISION AREDS-2 ORAL) Take 1 Tablet by mouth Twice daily    XARELTO  20 mg Oral Tablet TAKE 1 TABLET BY MOUTH EVERY EVENING WITH DINNER     Allergies   Allergen Reactions    Adhesive Rash    Ceclor [Cefaclor]  Other Adverse Reaction (Add comment)     Pt states a Ceclor pill gave her blisters in her mouth.       PHYSICAL EXAM:  Most Recent Vitals    Flowsheet Row Telemedicine from 09/02/2022 in Hematology/Oncology, [facility]   Temperature 36.3 C (97.3 F) filed at... 09/02/2022 0911   Heart Rate 77 filed at... 09/02/2022 0911   Respiratory Rate --   BP (Non-Invasive) 174/91 filed at... 09/02/2022 0911   SpO2 95 % filed at... 09/02/2022 0911   Height 1.626 m (5' 4) filed at... 09/02/2022 0911   Weight 98.2 kg (216 lb 6.4 oz) filed at... 09/02/2022 0911   BMI (Calculated) 37.22 filed at... 09/02/2022 0911   BSA (Calculated) 2.11 filed at... 09/02/2022 0911      ECOG Status:  (1) Restricted in physically strenuous activity, ambulatory and able to do work of light nature   Physical Exam    DIAGNOSTIC DATA:  No results found for this or any previous visit (from the past 82479 hours).    LABS:   CBC  Diff   Lab Results   Component Value Date/Time    WBC 3.3 (L) 11/07/2023 10:05 AM    HGB 11.5 11/07/2023 10:05 AM    HCT 34.1 11/07/2023 10:05 AM    PLTCNT 116 (L) 11/07/2023 10:05 AM    RBC 3.95 11/07/2023 10:05 AM    MCV 86.3 11/07/2023 10:05 AM    MCHC 33.7 11/07/2023 10:05 AM    MCH 29.0 11/07/2023 10:05 AM    RDW 14.9 11/07/2023 10:05 AM    MPV 8.8 11/07/2023 10:05 AM    Lab Results   Component Value Date/Time    PMNS 48 11/07/2023 10:05 AM    LYMPHOCYTES 39 11/07/2023 10:05 AM    EOSINOPHIL 3 11/07/2023 10:05 AM    MONOCYTES  10 11/07/2023 10:05 AM    BASOPHILS 1 11/07/2023 10:05 AM    BASOPHILS 0.00 11/07/2023 10:05 AM    PMNABS 1.60 (L) 11/07/2023 10:05 AM    LYMPHSABS 1.30 11/07/2023 10:05 AM    EOSABS 0.10 11/07/2023 10:05 AM    MONOSABS 0.30 11/07/2023 10:05 AM    BASABS 0.04 05/30/2022 05:40 AM            Comprehensive Metabolic Profile    Lab Results   Component Value Date    SODIUM 139 11/07/2023    POTASSIUM 4.3 11/07/2023    CHLORIDE 109 (H) 11/07/2023    CO2 29 11/07/2023    ANIONGAP 1 (L) 11/07/2023    BUN 13 11/07/2023    CREATININE 1.04 11/07/2023    ALBUMIN 3.6 11/07/2023    CALCIUM 9.8 11/07/2023    GLUCOSENF 99 11/07/2023    ALKPHOS 79 11/07/2023    ALT 19 11/07/2023    AST 27 11/07/2023    TOTBILIRUBIN 0.7 11/07/2023    TOTALPROTEIN 6.5 11/07/2023          BASIC METABOLIC PANEL  Lab Results   Component Value Date    SODIUM 139 11/07/2023    POTASSIUM 4.3 11/07/2023    CHLORIDE 109 (H) 11/07/2023    CO2 29 11/07/2023    ANIONGAP 1 (L) 11/07/2023    BUN 13 11/07/2023    CREATININE 1.04 11/07/2023    BUNCRRATIO 13 11/07/2023    GFR 54 (L) 11/07/2023    CALCIUM 9.8 11/07/2023    GLUCOSE Negative 01/05/2023    GLUCOSENF 99 11/07/2023           ASSESSMENT:  Problem List Items  Addressed This Visit          Oncology    History of lung cancer    Relevant Orders    CA 27,29    Breast cancer - Primary    Relevant Orders    CA 27,29        ICD-10-CM    1. Breast cancer  C50.919 CA 27,29      2. History of lung cancer  Z85.118 CA 27,29           PLAN:   1. All relevant medical records were reviewed including available pertinent provider notes, procedure notes, imaging, laboratory, and pathology.   2. All pertinent labs and/or imaging were reviewed with the patient.   3. Breast cancer:  Status post resection of an involved lymph node that was 2.3 cm on PET/CT.  No primary lesion was seen on imaging studies.  T0 N1 M0, stage IIA.  ER negative, HER2 Neu positive.  After resection of the node, she was rendered cancer-free radiographically.  She has only a couple of trastuzumab  treatments remaining.  After that is complete, we will switch to surveillance and see her every three months with laboratory studies.  4. History of lung cancer:  Continue surveillance.    5. History of gynecologic cancer: Continue surveillance as well.  6. 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  Her most recent Doppler showed no evidence of residual thrombus so she can discontinue the medication at this time.    Kelsey Preston was given the chance to ask questions, and these were answered to their satisfaction. The patient is welcome to call with any questions or concerns in the meantime.     On the day of the encounter, a total of 45 minutes was spent on this patient  encounter including review of historical information, examination, documentation and post-visit activities.   Return in about 3 weeks (around 11/28/2023).     Lynwood Pipes, MD  11/07/2023 , 11:14  The patient's insurance company bears full legal and financial responsibility resulting from any deviations that they cause to my recommended treatment plan.   CC:  Manus Endo, MD  [address]  [place] [address]    Endo Manus, MD  [address]  [place],  [place] [address]    This note was partially generated using MModal Fluency Direct system, and there may be some incorrect words, spellings, and punctuation that were not noted in checking the note before saving.

## 2023-11-07 NOTE — Nurses Notes (Signed)
 1057 patient arrived to floor via wheelchair accompanied by daughter and family friend. Patient here for Kanjiniti infusion. Patient to room 224B. Leandrew Kitty, RN  1110 VSS. Right chest port accessed. Blood return noted. Leandrew Kitty, RN  1125 assessment completed. Edema noted BLE. Patient reports no pain no complaints or concerns at this time. Faamily friend went at bedside.  Leandrew Kitty, RN  Patient Assessment/Symptom Management Patient Has No MD Appointment Today   Key: (+) Symptom present           (-)  Symptom not present If Symptom is Positive(+) a Nursing Note is required   Edema +   Uncontrolled Nausea -   Vomiting -   Inability to eat/drink -   Mouth Sores -   Diarrhea + mag once daily    Constipation (? Last BM) -   Fatigue that interferes with ADL's -   Numbness/Tingling -change -   Other -   Fever/Signs & Symptoms of infection -   Nurse Initials Eb      Patient denies symptoms at this time Leandrew Kitty, RN      [phone] Mag 1.7 will infuse 2 grams per protocol. Infusion started. Leandrew Kitty, RN  1240 magnesium  infusion completed. Patient tolerated well. Leandrew Kitty, RN  1246 kanjinti  infusion started. Leandrew Kitty, RN  1316 kanjinti  infusion completed. Patient tolerated well. Leandrew Kitty, RN  1325 right chest port deaccessed. Pressure dressing applied. Leandrew Kitty, RN  [phone] patient left floor via wheelchair accompanied by daughter. Leandrew Kitty, RN

## 2023-11-12 ENCOUNTER — Encounter (INDEPENDENT_AMBULATORY_CARE_PROVIDER_SITE_OTHER): Payer: Self-pay | Admitting: HEMATOLOGY-ONCOLOGY

## 2023-11-13 ENCOUNTER — Other Ambulatory Visit: Payer: Self-pay

## 2023-11-13 ENCOUNTER — Ambulatory Visit
Admission: RE | Admit: 2023-11-13 | Discharge: 2023-11-13 | Disposition: A | Discharge status: Home / Self Care | Payer: Self-pay | Source: Ambulatory Visit | Attending: NURSE PRACTITIONER | Admitting: NURSE PRACTITIONER

## 2023-11-13 DIAGNOSIS — R600 Localized edema: Secondary | ICD-10-CM | POA: Insufficient documentation

## 2023-11-13 DIAGNOSIS — Z5181 Encounter for therapeutic drug level monitoring: Secondary | ICD-10-CM | POA: Insufficient documentation

## 2023-11-13 LAB — TRANSTHORACIC ECHOCARDIOGRAM - ADULT: EF MEASUREMENT VALUE: 66

## 2023-11-13 MED ORDER — SODIUM CHLORIDE 0.9 % INJECTION SOLUTION
2.0000 mL | Freq: Once | INTRAVENOUS | Status: DC | PRN
Start: 2023-11-13 — End: 2023-11-14
  Administered 2023-11-13: 3 mL via INTRAVENOUS

## 2023-11-27 ENCOUNTER — Other Ambulatory Visit (INDEPENDENT_AMBULATORY_CARE_PROVIDER_SITE_OTHER): Payer: Self-pay | Admitting: HEMATOLOGY-ONCOLOGY

## 2023-11-28 ENCOUNTER — Encounter (HOSPITAL_COMMUNITY): Payer: Self-pay

## 2023-11-28 ENCOUNTER — Ambulatory Visit (INDEPENDENT_AMBULATORY_CARE_PROVIDER_SITE_OTHER): Payer: Self-pay | Admitting: NURSE PRACTITIONER

## 2023-11-28 ENCOUNTER — Telehealth (INDEPENDENT_AMBULATORY_CARE_PROVIDER_SITE_OTHER): Payer: Self-pay | Admitting: HEMATOLOGY-ONCOLOGY

## 2023-11-28 ENCOUNTER — Ambulatory Visit (HOSPITAL_COMMUNITY): Payer: Self-pay

## 2023-11-28 ENCOUNTER — Ambulatory Visit (INDEPENDENT_AMBULATORY_CARE_PROVIDER_SITE_OTHER)
Admission: RE | Admit: 2023-11-28 | Discharge: 2023-11-28 | Disposition: A | Discharge status: Home / Self Care | Payer: Self-pay | Source: Ambulatory Visit | Attending: HEMATOLOGY-ONCOLOGY

## 2023-11-28 ENCOUNTER — Ambulatory Visit
Admission: RE | Admit: 2023-11-28 | Discharge: 2023-11-28 | Disposition: A | Discharge status: Home / Self Care | Payer: Self-pay | Source: Ambulatory Visit | Attending: HEMATOLOGY-ONCOLOGY | Admitting: HEMATOLOGY-ONCOLOGY

## 2023-11-28 ENCOUNTER — Ambulatory Visit (HOSPITAL_BASED_OUTPATIENT_CLINIC_OR_DEPARTMENT_OTHER): Payer: Self-pay | Admitting: HEMATOLOGY-ONCOLOGY

## 2023-11-28 ENCOUNTER — Encounter (INDEPENDENT_AMBULATORY_CARE_PROVIDER_SITE_OTHER): Payer: Self-pay | Admitting: HEMATOLOGY-ONCOLOGY

## 2023-11-28 ENCOUNTER — Other Ambulatory Visit: Payer: Self-pay

## 2023-11-28 VITALS — BP 132/86 | HR 66 | Temp 97.9°F | Ht 64.0 in | Wt 191.6 lb

## 2023-11-28 VITALS — BP 144/75 | HR 65 | Temp 97.3°F | Resp 20

## 2023-11-28 DIAGNOSIS — Z171 Estrogen receptor negative status [ER-]: Secondary | ICD-10-CM | POA: Insufficient documentation

## 2023-11-28 DIAGNOSIS — C50919 Malignant neoplasm of unspecified site of unspecified female breast: Secondary | ICD-10-CM | POA: Insufficient documentation

## 2023-11-28 DIAGNOSIS — Z9889 Other specified postprocedural states: Secondary | ICD-10-CM | POA: Insufficient documentation

## 2023-11-28 DIAGNOSIS — Z1731 Human epidermal growth factor receptor 2 positive status: Secondary | ICD-10-CM | POA: Insufficient documentation

## 2023-11-28 DIAGNOSIS — Z8542 Personal history of malignant neoplasm of other parts of uterus: Secondary | ICD-10-CM | POA: Insufficient documentation

## 2023-11-28 DIAGNOSIS — Z85118 Personal history of other malignant neoplasm of bronchus and lung: Secondary | ICD-10-CM

## 2023-11-28 DIAGNOSIS — J852 Abscess of lung without pneumonia: Secondary | ICD-10-CM | POA: Insufficient documentation

## 2023-11-28 DIAGNOSIS — Z79899 Other long term (current) drug therapy: Secondary | ICD-10-CM | POA: Insufficient documentation

## 2023-11-28 DIAGNOSIS — I82412 Acute embolism and thrombosis of left femoral vein: Secondary | ICD-10-CM

## 2023-11-28 DIAGNOSIS — Z86718 Personal history of other venous thrombosis and embolism: Secondary | ICD-10-CM | POA: Insufficient documentation

## 2023-11-28 DIAGNOSIS — Z5112 Encounter for antineoplastic immunotherapy: Secondary | ICD-10-CM | POA: Insufficient documentation

## 2023-11-28 DIAGNOSIS — Z79620 Long term (current) use of immunosuppressive biologic: Secondary | ICD-10-CM | POA: Insufficient documentation

## 2023-11-28 LAB — CBC WITH DIFF
BASOPHIL #: 0 x10ˆ3/uL (ref 0.00–0.10)
BASOPHIL %: 1 % (ref 0–1)
EOSINOPHIL #: 0.1 x10ˆ3/uL (ref 0.00–0.50)
EOSINOPHIL %: 2 % (ref 1–7)
HCT: 33.8 % (ref 31.2–41.9)
HGB: 11.4 g/dL (ref 10.9–14.3)
LYMPHOCYTE #: 1.3 x10ˆ3/uL (ref 1.10–3.10)
LYMPHOCYTE %: 34 % (ref 16–46)
MCH: 29 pg (ref 24.7–32.8)
MCHC: 33.7 g/dL (ref 32.3–35.6)
MCV: 85.9 fL (ref 75.5–95.3)
MONOCYTE #: 0.3 x10ˆ3/uL (ref 0.20–0.90)
MONOCYTE %: 9 % (ref 4–11)
MPV: 8.9 fL (ref 7.9–10.8)
NEUTROPHIL #: 2 x10ˆ3/uL (ref 1.90–8.20)
NEUTROPHIL %: 54 % (ref 43–77)
PLATELETS: 136 x10ˆ3/uL — ABNORMAL LOW (ref 140–440)
RBC: 3.93 x10ˆ6/uL (ref 3.63–4.92)
RDW: 14.8 % (ref 12.3–17.7)
WBC: 3.7 x10ˆ3/uL — ABNORMAL LOW (ref 3.8–11.8)

## 2023-11-28 LAB — COMPREHENSIVE METABOLIC PANEL, NON-FASTING
ALBUMIN/GLOBULIN RATIO: 1.2 (ref 0.8–1.4)
ALBUMIN: 3.7 g/dL (ref 3.5–5.7)
ALKALINE PHOSPHATASE: 81 U/L (ref 34–104)
ALT (SGPT): 17 U/L (ref 7–52)
ANION GAP: 5 mmol/L (ref 4–13)
AST (SGOT): 27 U/L (ref 13–39)
BILIRUBIN TOTAL: 0.7 mg/dL (ref 0.3–1.0)
BUN/CREA RATIO: 14 (ref 6–22)
BUN: 16 mg/dL (ref 7–25)
CALCIUM, CORRECTED: 10.9 mg/dL — ABNORMAL HIGH (ref 8.9–10.8)
CALCIUM: 10.7 mg/dL — ABNORMAL HIGH (ref 8.6–10.3)
CHLORIDE: 105 mmol/L (ref 98–107)
CO2 TOTAL: 31 mmol/L (ref 21–31)
CREATININE: 1.15 mg/dL (ref 0.60–1.30)
ESTIMATED GFR: 48 mL/min/1.73mˆ2 — ABNORMAL LOW (ref >59)
GLOBULIN: 3 (ref 2.0–3.5)
GLUCOSE: 109 mg/dL (ref 74–109)
OSMOLALITY, CALCULATED: 283 mosm/kg (ref 270–290)
POTASSIUM: 4.4 mmol/L (ref 3.5–5.1)
PROTEIN TOTAL: 6.7 g/dL (ref 6.4–8.9)
SODIUM: 141 mmol/L (ref 136–145)

## 2023-11-28 LAB — MAGNESIUM: MAGNESIUM: 1.5 mg/dL — ABNORMAL LOW (ref 1.9–2.7)

## 2023-11-28 MED ORDER — SODIUM CHLORIDE 0.9% FLUSH BAG - 250 ML
INTRAVENOUS | Status: DC | PRN
Start: 2023-11-28 — End: 2023-11-29

## 2023-11-28 MED ORDER — ALBUTEROL SULFATE HFA 90 MCG/ACTUATION AEROSOL INHALER - RN
2.0000 | Freq: Once | RESPIRATORY_TRACT | Status: DC | PRN
Start: 2023-11-28 — End: 2023-11-29

## 2023-11-28 MED ORDER — HYDROCORTISONE SOD SUCCINATE 100 MG/2 ML VIAL WRAPPER
100.0000 mg | Freq: Once | INTRAMUSCULAR | Status: DC | PRN
Start: 2023-11-28 — End: 2023-11-29

## 2023-11-28 MED ORDER — DIPHENHYDRAMINE 50 MG/ML INJECTION SOLUTION
50.0000 mg | Freq: Once | INTRAMUSCULAR | Status: DC | PRN
Start: 2023-11-28 — End: 2023-11-29

## 2023-11-28 MED ORDER — TRASTUZUMAB-ANNS 150 MG INTRAVENOUS SOLUTION
6.0000 mg/kg | Freq: Once | INTRAVENOUS | Status: AC
Start: 2023-11-28 — End: 2023-11-28
  Administered 2023-11-28: 525 mg via INTRAVENOUS
  Administered 2023-11-28: 0 mg via INTRAVENOUS
  Filled 2023-11-28: qty 25

## 2023-11-28 MED ORDER — EPINEPHRINE 1 MG/ML (1 ML) INJECTION SOLUTION
0.3000 mg | Freq: Once | INTRAMUSCULAR | Status: DC | PRN
Start: 2023-11-28 — End: 2023-11-29

## 2023-11-28 MED ORDER — DEXTROSE 5% IN WATER (D5W) FLUSH BAG - 250 ML
INTRAVENOUS | Status: DC | PRN
Start: 2023-11-28 — End: 2023-11-29

## 2023-11-28 MED ORDER — ALBUTEROL SULFATE 2.5 MG/3 ML (0.083 %) SOLUTION FOR NEBULIZATION
2.5000 mg | INHALATION_SOLUTION | Freq: Once | RESPIRATORY_TRACT | Status: DC | PRN
Start: 2023-11-28 — End: 2023-11-29

## 2023-11-28 MED ORDER — FAMOTIDINE (PF) 20 MG/2 ML INTRAVENOUS SOLUTION
20.0000 mg | Freq: Once | INTRAVENOUS | Status: DC | PRN
Start: 2023-11-28 — End: 2023-11-29

## 2023-11-28 MED ORDER — MEPERIDINE (PF) 25 MG/ML INJECTION SOLUTION
12.5000 mg | Freq: Once | INTRAMUSCULAR | Status: DC | PRN
Start: 2023-11-28 — End: 2023-11-29

## 2023-11-28 MED ORDER — MAGNESIUM SULFATE 1 GRAM/100 ML IN DEXTROSE 5 % INTRAVENOUS PIGGYBACK
1.0000 g | INJECTION | INTRAVENOUS | Status: AC
Start: 2023-11-28 — End: 2023-11-28
  Administered 2023-11-28 (×2): 1 g via INTRAVENOUS
  Administered 2023-11-28 (×2): 0 g via INTRAVENOUS
  Filled 2023-11-28 (×2): qty 100

## 2023-11-28 MED ORDER — DIPHENHYDRAMINE 50 MG/ML INJECTION SOLUTION
25.0000 mg | Freq: Once | INTRAMUSCULAR | Status: DC | PRN
Start: 2023-11-28 — End: 2023-11-29

## 2023-11-28 NOTE — Progress Notes (Signed)
 Department of Hematology/Oncology  Progress Note   Name: Kelsey Preston  FMW:[id number]  Date of Birth: October 11, 1940  Encounter Date: 11/28/2023    REFERRING PROVIDER:  Andra Pastor, MD  [address]  [place],  [place] [address]    TELEMEDICINE DOCUMENTATION:  Patient Location:  [employer], [facility] outpatient Hematology/Oncology [address], [place] [address]  Patient/family aware of provider location:  yes  Patient/family consent for telemedicine:  yes    REASON FOR OFFICE VISIT:  Breast Cancer     HISTORY OF PRESENT ILLNESS:  Kelsey Preston is a 83 y.o. female who presents today for follow up of breast cancer     The patient has a history of non-small-cell lung cancer, status post lobectomy in 2014.      She also has a history of endometrial cancer, and is status post surgical resection of that cancer as well.    More recently, the patient was found to have a left axillary lesion measuring 2.3 cm on PET-CT scan.  For some reason, I can not find an actual size measurement on the pathology report.  Multiple imaging studies of the breast were performed, but no primary lesion was found.  The malignant lesion was definitively classified as a lymph node.    Receptor staining showed that it was ER negative and HER2 Neu positive.  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    09/10/2022: The patient is here for follow up of early stage breast cancer.  She is doing well and denies any new problems at this time.    09/27/2022: The patient is here for follow up breast cancer.  She is doing well at this time and has no new complaints.    10/18/2022: the patient is here for follow up of breast cancer. She has been on treatment with paclitaxel  and trastuzumab . She has been hospitalized since the last visit due to neutropenia and hypotension. She has also had electrolyte issues, with her magnesium  level being somewhat low. She is taking over-the-counter magnesium  oxide.    11/08/2022: The patient is here for  follow up of breast cancer.  She has been on treatment with weekly paclitaxel  and trastuzumab .  She did end up in the hospital relatively recently with some low cell counts, but it appears to have resolved.    12/06/2022: The patient is here for follow up of breast cancer.  She has been doing relatively well but has noticed some peripheral neuropathy.    02/11/2023: The patient is here for follow up of breast cancer.  She was on trastuzumab , and it was held due to a percentage decline in her ejection fraction.  She had a recent follow up echocardiogram which showed complete recovery.    11/07/2023: The patient is here for follow up of breast cancer.  She has a couple of treatments of trastuzumab  remaining.  She denies any problems at this time.  She is inquiring about being able to discontinue her anticoagulant because she had a recent lower extremity Doppler that showed no evidence of residual clot.    11/28/2023: The patient is here for follow up of breast cancer.  She denies any issues at this time.    ROS:   Pertinent review of systems as discussed in HPI    HISTORY:  Past Medical History:   Diagnosis Date    Diabetes mellitus, type 2     Embolism (CMS HCC)     left leg    Endometrial cancer (CMS  HCC)     HTN (hypertension)     Hx of breast cancer     Lung cancer     Macular degeneration (senile) of retina          Past Surgical History:   Procedure Laterality Date    HIP SURGERY Left     HX CHOLECYSTECTOMY      HX COLONOSCOPY      HX HYSTERECTOMY      HX LOBECTOMY Left     LUNG CANCER SURGERY      PORTACATH PLACEMENT           Social History     Socioeconomic History    Marital status: Married     Spouse name: Not on file    Number of children: Not on file    Years of education: Not on file    Highest education level: Not on file   Occupational History    Not on file   Tobacco Use    Smoking status: Never    Smokeless tobacco: Never   Vaping Use    Vaping status: Never Used   Substance and Sexual Activity     Alcohol  use: Never    Drug use: Never    Sexual activity: Not Currently   Other Topics Concern    Not on file   Social History Narrative    Not on file     Social Determinants of Health     Financial Resource Strain: Medium Risk (01/29/2023)    Received from [facility]    Overall Financial Resource Strain (CARDIA)     Difficulty of Paying Living Expenses: Somewhat hard   Transportation Needs: No Transportation Needs (01/29/2023)    Received from [facility] - Transportation     Lack of Transportation (Medical): No     Lack of Transportation (Non-Medical): No   Social Connections: Moderately Integrated (01/29/2023)    Received from [facility]    Social Connection and Isolation Panel     In a typical week, how many times do you talk on the phone with family, friends, or neighbors?: More than three times a week     How often do you get together with friends or relatives?: More than three times a week     How often do you attend church or religious services?: More than 4 times per year     Do you belong to any clubs or organizations such as church groups, unions, fraternal or athletic groups, or school groups?: No     How often do you attend meetings of the clubs or organizations you belong to?: Never     Are you married, widowed, divorced, separated, never married, or living with a partner?: Married   Intimate Partner Violence: Not At Risk (01/29/2023)    Received from [employer], Afraid, Rape, and Kick questionnaire     Within the last year, have you been afraid of your partner or ex-partner?: No     Within the last year, have you been humiliated or emotionally abused in other ways by your partner or ex-partner?: No     Within the last year, have you been kicked, hit, slapped, or otherwise physically hurt by your partner or ex-partner?: No     Within the last year, have you been raped or forced to have any kind of sexual activity by your partner or ex-partner?: No   Housing  Stability: Low Risk  (  01/29/2023)    Received from [facility] Stability Vital Sign     In the last 12 months, was there a time when you were not able to pay the mortgage or rent on time?: No     In the past 12 months, how many times have you moved where you were living?: 1     At any time in the past 12 months, were you homeless or living in a shelter (including now)?: No     Family Medical History:       Problem Relation (Age of Onset)    Cancer Brother            Current Outpatient Medications   Medication Sig    atorvastatin  (LIPITOR) 40 mg Oral Tablet Take 1 Tablet (40 mg total) by mouth Every evening for 30 days    carvediloL  (COREG ) 12.5 mg Oral Tablet Take 1 Tablet (12.5 mg total) by mouth Twice daily    cholecalciferol , vitamin D3, 1,250 mcg (50,000 unit) Oral Capsule Take 1 Capsule (50,000 Units total) by mouth Every 7 days    ergocalciferol, vitamin D2, (DRISDOL) 1,250 mcg (50,000 unit) Oral Capsule Take 1 Capsule (50,000 Units total) by mouth Every 7 days    esomeprazole magnesium  (NEXIUM) 40 mg Oral Capsule, Delayed Release(E.C.) Take 1 Capsule (40 mg total) by mouth Every morning before breakfast    ezetimibe  (ZETIA ) 10 mg Oral Tablet Take 1 Tablet (10 mg total) by mouth Every evening    famotidine  (PEPCID ) 40 mg Oral Tablet Take 1 Tablet (40 mg total) by mouth Twice daily    gabapentin  (NEURONTIN ) 100 mg Oral Capsule Take 1 Capsule (100 mg total) by mouth Every night    lidocaine -prilocaine  (EMLA ) 2.5-2.5 % Cream One time    magnesium  oxide (MAG-OX) 400 mg Oral Tablet Take 1 Tablet (400 mg total) by mouth Twice daily (Patient taking differently: Take 1 Tablet (400 mg total) by mouth Daily)    metFORMIN (GLUCOPHAGE XR) 500 mg Oral Tablet Sustained Release 24 hr Take 1 Tablet (500 mg total) by mouth Twice daily    multivitamin with iron Oral Tablet Take 1 Tablet by mouth Daily    ondansetron  (ZOFRAN ) 4 mg Oral Tablet Take 1 Tablet (4 mg total) by mouth Every 6 hours as needed for  Nausea/Vomiting Indications: prevent nausea and vomiting from cancer chemotherapy    sertraline  (ZOLOFT ) 50 mg Oral Tablet Take 1 Tablet (50 mg total) by mouth Daily    traMADoL  (ULTRAM ) 50 mg Oral Tablet Take 1 Tablet (50 mg total) by mouth Every 4 hours as needed for Pain    vit C/E/Zn/coppr/lutein/zeaxan (PRESERVISION AREDS-2 ORAL) Take 1 Tablet by mouth Twice daily    XARELTO  20 mg Oral Tablet TAKE 1 TABLET BY MOUTH EVERY EVENING WITH DINNER     Allergies   Allergen Reactions    Adhesive Rash    Ceclor [Cefaclor]  Other Adverse Reaction (Add comment)     Pt states a Ceclor pill gave her blisters in her mouth.       PHYSICAL EXAM:  Most Recent Vitals    Flowsheet Row Telemedicine from 09/02/2022 in Hematology/Oncology, [facility]   Temperature 36.3 C (97.3 F) filed at... 09/02/2022 0911   Heart Rate 77 filed at... 09/02/2022 0911   Respiratory Rate --   BP (Non-Invasive) 174/91 filed at... 09/02/2022 0911   SpO2 95 % filed at... 09/02/2022 0911   Height 1.626 m (5' 4) filed at.[facility]  09/02/2022 0911   Weight 98.2 kg (216 lb 6.4 oz) filed at... 09/02/2022 0911   BMI (Calculated) 37.22 filed at... 09/02/2022 0911   BSA (Calculated) 2.11 filed at... 09/02/2022 0911      ECOG Status: (1) Restricted in physically strenuous activity, ambulatory and able to do work of light nature   Physical Exam    DIAGNOSTIC DATA:  No results found for this or any previous visit (from the past 82479 hours).    LABS:   CBC  Diff   Lab Results   Component Value Date/Time    WBC 3.7 (L) 11/28/2023 11:02 AM    HGB 11.4 11/28/2023 11:02 AM    HCT 33.8 11/28/2023 11:02 AM    PLTCNT 136 (L) 11/28/2023 11:02 AM    RBC 3.93 11/28/2023 11:02 AM    MCV 85.9 11/28/2023 11:02 AM    MCHC 33.7 11/28/2023 11:02 AM    MCH 29.0 11/28/2023 11:02 AM    RDW 14.8 11/28/2023 11:02 AM    MPV 8.9 11/28/2023 11:02 AM    Lab Results   Component Value Date/Time    PMNS 54 11/28/2023 11:02 AM    LYMPHOCYTES 34 11/28/2023 11:02 AM    EOSINOPHIL 2 11/28/2023  11:02 AM    MONOCYTES 9 11/28/2023 11:02 AM    BASOPHILS 1 11/28/2023 11:02 AM    BASOPHILS 0.00 11/28/2023 11:02 AM    PMNABS 2.00 11/28/2023 11:02 AM    LYMPHSABS 1.30 11/28/2023 11:02 AM    EOSABS 0.10 11/28/2023 11:02 AM    MONOSABS 0.30 11/28/2023 11:02 AM    BASABS 0.04 05/30/2022 05:40 AM            Comprehensive Metabolic Profile    Lab Results   Component Value Date    SODIUM 141 11/28/2023    POTASSIUM 4.4 11/28/2023    CHLORIDE 105 11/28/2023    CO2 31 11/28/2023    ANIONGAP 5 11/28/2023    BUN 16 11/28/2023    CREATININE 1.15 11/28/2023    ALBUMIN 3.7 11/28/2023    CALCIUM 10.7 (H) 11/28/2023    GLUCOSENF 109 11/28/2023    ALKPHOS 81 11/28/2023    ALT 17 11/28/2023    AST 27 11/28/2023    TOTBILIRUBIN 0.7 11/28/2023    TOTALPROTEIN 6.7 11/28/2023          BASIC METABOLIC PANEL  Lab Results   Component Value Date    SODIUM 141 11/28/2023    POTASSIUM 4.4 11/28/2023    CHLORIDE 105 11/28/2023    CO2 31 11/28/2023    ANIONGAP 5 11/28/2023    BUN 16 11/28/2023    CREATININE 1.15 11/28/2023    BUNCRRATIO 14 11/28/2023    GFR 48 (L) 11/28/2023    CALCIUM 10.7 (H) 11/28/2023    GLUCOSE Negative 01/05/2023    GLUCOSENF 109 11/28/2023           ASSESSMENT:  Problem List Items Addressed This Visit          Oncology    Breast cancer - Primary    Relevant Orders    COMPREHENSIVE METABOLIC PANEL, NON-FASTING    CBC    CA 27,29    COMPREHENSIVE METABOLIC PANEL, NON-FASTING    MAGNESIUM         ICD-10-CM    1. Breast cancer  C50.919 COMPREHENSIVE METABOLIC PANEL, NON-FASTING     CBC     CA 27,29     COMPREHENSIVE METABOLIC PANEL, NON-FASTING     MAGNESIUM   PLAN:   1. All relevant medical records were reviewed including available pertinent provider notes, procedure notes, imaging, laboratory, and pathology.   2. All pertinent labs and/or imaging were reviewed with the patient.   3. Breast cancer:  Status post resection of an involved lymph node that was 2.3 cm on PET/CT.  No primary lesion was seen on imaging  studies.  T0 N1 M0, stage IIA.  ER negative, HER2 Neu positive.  After resection of the node, she was rendered cancer-free radiographically.  Her final trastuzumab  treatment is today.  We will switch to surveillance and I will see her back in three months with laboratory studies.  4. History of lung cancer:  Continue surveillance.    5. History of gynecologic cancer: Continue surveillance as well.  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    Miko Markwood was given the chance to ask questions, and these were answered to their satisfaction. The patient is welcome to call with any questions or concerns in the meantime.     On the day of the encounter, a total of 35 minutes was spent on this patient encounter including review of historical information, examination, documentation and post-visit activities.   Return in about 3 months (around 02/27/2024).     Lynwood Pipes, MD  11/28/2023 , 12:12  The patient was seen as part of a collaborative telemedicine service with Dr. Pipes who participated in the encounter by active presence via approved video/audio means for portions of the encounter.    The patient's insurance company bears full legal and financial responsibility resulting from any deviations that they cause to my recommended treatment plan.    CC:  Manus Endo, MD  [address]  [place] [address]    Endo Manus, MD  [address]  [place],  [place] [address]    This note was partially generated using MModal Fluency Direct system, and there may be some incorrect words, spellings, and punctuation that were not noted in checking the note before saving.

## 2023-11-28 NOTE — Nurses Notes (Signed)
 [phone]: Arrived to outpatient oncology via wheelchair with family. Here today for Kanjinti  infusion. Port to right chest accessed without difficulty. Excellent blood return noted. Flushes easily. Denies all complaints at present. Delon Larve, RN  Patient Assessment/Symptom Management Patient Has No MD Appointment Today   Key: (+) Symptom present           (-)  Symptom not present If Symptom is Positive(+) a Nursing Note is required   Edema + (unchanged)   Uncontrolled Nausea -   Vomiting -   Inability to eat/drink -   Mouth Sores -   Diarrhea -   Constipation (? Last BM) -   Fatigue that interferes with ADL's -   Numbness/Tingling -change -   Other -   Fever/Signs & Symptoms of infection -   Nurse Initials JT      1241: Magnesium  2 gram infusion started at this time. Delon Larve, RN  [phone]: Magnesium  infusion complete and line flushing. Delon Larve, RN  [phone]: Kanjinti  infusion started at this time. Delon Larve, RN  [phone]: Kanjinti  infusion complete and line flushing. Delon Larve, RN  1435: Flush complete. No signs or symptoms of reaction noted. Port flushed and deaccessed per protocol. Site clear. Delon Larve, RN  [phone]: Out of department at this time via wheelchair with family. No changes noted. Delon Larve, RN

## 2023-11-28 NOTE — Nursing Note (Signed)
 Patient in room. Today is last treatment. All questions answered. Went over labs. Patient can return in 3 months. Standing orders placed for mag labs every 3 weeks.

## 2023-11-30 LAB — CA 27,29: CA 27.29: 22 U/mL (ref <38)

## 2023-12-11 ENCOUNTER — Other Ambulatory Visit: Payer: Self-pay

## 2023-12-11 ENCOUNTER — Ambulatory Visit
Admission: RE | Admit: 2023-12-11 | Discharge: 2023-12-11 | Disposition: A | Discharge status: Still In Hospital | Source: Ambulatory Visit | Attending: RADIATION ONCOLOGY | Admitting: RADIATION ONCOLOGY

## 2023-12-11 ENCOUNTER — Ambulatory Visit: Attending: RADIATION ONCOLOGY

## 2023-12-11 DIAGNOSIS — C50912 Malignant neoplasm of unspecified site of left female breast: Secondary | ICD-10-CM | POA: Insufficient documentation

## 2023-12-11 DIAGNOSIS — C50919 Malignant neoplasm of unspecified site of unspecified female breast: Secondary | ICD-10-CM | POA: Insufficient documentation

## 2023-12-11 DIAGNOSIS — Z1731 Human epidermal growth factor receptor 2 positive status: Secondary | ICD-10-CM | POA: Insufficient documentation

## 2023-12-11 DIAGNOSIS — Z79620 Long term (current) use of immunosuppressive biologic: Secondary | ICD-10-CM | POA: Insufficient documentation

## 2023-12-11 DIAGNOSIS — Z9889 Other specified postprocedural states: Secondary | ICD-10-CM | POA: Insufficient documentation

## 2023-12-11 DIAGNOSIS — Z171 Estrogen receptor negative status [ER-]: Secondary | ICD-10-CM | POA: Insufficient documentation

## 2023-12-11 DIAGNOSIS — Z1722 Progesterone receptor negative status: Secondary | ICD-10-CM | POA: Insufficient documentation

## 2023-12-11 NOTE — Progress Notes (Signed)
 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  Continuing trastuzumab .       Assessment:  83 year old doing well continuing on trastuzumab  with no signs of recurrence on Signatera 3 months ago.    Oncology disease status: The patient has no clinical or biochemical evidence of cancer at this visit    Recommendations:  We will repeat Signatera at this time and follow up in 3 months.  She is instructed to call in the interim should she have any problems or concerns.    The indications, time course, benefits, risks and side effects of radiation treatment were explained to the patient, and her questions were answered to her apparent satisfaction. I encouraged her to contact us  at any time should she have any further questions or concerns. I personally saw and examined the patient, and reviewed all prior imaging and pathologic findings with her. I spent greater than 50% of a 30 minute visit in discussion of the patient's diagnosis and management.    FULL NOTE     Interval History :   Kelsey Preston is a 83 y.o. female with a history of 2 prior cancers in the distant past.  She had prior resection for a endometrial cancer as well as a primary lung cancer.  She has done well without evidence of recurrence of either of these cancers.  She was noted to have an enlarging axillary lymph node.  Testing including PET-CT showed uptake in the lymph node.  Biopsy was performed April of the 2nd that showed cancer.  Lymph node was resected May 21st.  Pathology showed 2.6 cm lymph node that was essentially entirely replaced by tumor.  Caris testing showed this to be ER negative PR negative HER2 Neu positive.  She was seen by Dr. Moses in his completed course of paclitaxel  with trastuzumab .  She will now  continue trismus pertuzumab for entire year.  She has had some difficulty with neuropathy.  She has had 2 falls resulting in hip fracture and compression fracture of her spine.     She returns in follow up today.  She has no new complaints.  Last Signatera 3 months ago was negative.  She is continuing on Kanjinti .    Pain Assessment:  Musculoskeletal aches and pains    Past Medical/Surgical History:  Past Medical History:   Diagnosis Date    Diabetes mellitus, type 2     Embolism (CMS HCC)     left leg    Endometrial cancer (CMS HCC)     HTN (hypertension)     Hx of breast cancer     Lung cancer     Macular degeneration (senile) of retina          Past Surgical History:   Procedure Laterality Date    HIP SURGERY Left     HX CHOLECYSTECTOMY      HX COLONOSCOPY      HX HYSTERECTOMY      HX LOBECTOMY Left     LUNG CANCER SURGERY      PORTACATH PLACEMENT             Family History:   Family Medical History:  Problem Relation (Age of Onset)    Cancer Brother              Social History:   Social History     Socioeconomic History    Marital status: Married     Spouse name: Not on file    Number of children: Not on file    Years of education: Not on file    Highest education level: Not on file   Occupational History    Not on file   Tobacco Use    Smoking status: Never    Smokeless tobacco: Never   Vaping Use    Vaping status: Never Used   Substance and Sexual Activity    Alcohol  use: Never    Drug use: Never    Sexual activity: Not Currently   Other Topics Concern    Not on file   Social History Narrative    Not on file     Social Determinants of Health     Financial Resource Strain: Medium Risk (01/29/2023)    Received from [facility]    Overall Financial Resource Strain (CARDIA)     Difficulty of Paying Living Expenses: Somewhat hard   Transportation Needs: No Transportation Needs (01/29/2023)    Received from [facility] - Transportation     Lack of Transportation (Medical): No     Lack of  Transportation (Non-Medical): No   Social Connections: Moderately Integrated (01/29/2023)    Received from [facility]    Social Connection and Isolation Panel     In a typical week, how many times do you talk on the phone with family, friends, or neighbors?: More than three times a week     How often do you get together with friends or relatives?: More than three times a week     How often do you attend church or religious services?: More than 4 times per year     Do you belong to any clubs or organizations such as church groups, unions, fraternal or athletic groups, or school groups?: No     How often do you attend meetings of the clubs or organizations you belong to?: Never     Are you married, widowed, divorced, separated, never married, or living with a partner?: Married   Intimate Partner Violence: Not At Risk (01/29/2023)    Received from [facility]    Humiliation, Afraid, Rape, and Kick questionnaire     Within the last year, have you been afraid of your partner or ex-partner?: No     Within the last year, have you been humiliated or emotionally abused in other ways by your partner or ex-partner?: No     Within the last year, have you been kicked, hit, slapped, or otherwise physically hurt by your partner or ex-partner?: No     Within the last year, have you been raped or forced to have any kind of sexual activity by your partner or ex-partner?: No   Housing Stability: Low Risk  (01/29/2023)    Received from [facility] Stability Vital Sign     In the last 12 months, was there a time when you were not able to pay the mortgage or rent on time?: No     In the past 12 months, how many times have you moved where you were living?: 1     At any time in the past 12 months, were you homeless or living  in a shelter (including now)?: No       ALLERGIES:   Allergies   Allergen Reactions    Adhesive Rash    Ceclor [Cefaclor]  Other Adverse Reaction (Add comment)     Pt states a Ceclor pill gave  her blisters in her mouth.        MEDICATIONS:   Current Outpatient Medications   Medication Instructions    atorvastatin  (LIPITOR) 40 mg, Oral, EVERY EVENING    carvediloL  (COREG ) 12.5 mg, 2 TIMES DAILY    cholecalciferol  (vitamin D3) 50,000 Units, Oral, EVERY 7 DAYS    ergocalciferol (vitamin D2) (DRISDOL) 50,000 Units, EVERY 7 DAYS    esomeprazole magnesium  (NEXIUM) 40 mg, EVERY MORNING BEFORE BREAKFAST    ezetimibe  (ZETIA ) 10 mg, EVERY EVENING    famotidine  (PEPCID ) 40 mg, Oral, 2 TIMES DAILY    gabapentin  (NEURONTIN ) 100 mg, Oral, NIGHTLY    lidocaine -prilocaine  (EMLA ) 2.5-2.5 % Cream ONCE    magnesium  oxide (MAG-OX) 400 mg, 2 TIMES DAILY    metFORMIN (GLUCOPHAGE XR) 500 mg, 2 TIMES DAILY    multivitamin with iron Oral Tablet 1 Tablet, Daily    ondansetron  (ZOFRAN ) 4 mg, Oral, EVERY 6 HOURS PRN    sertraline  (ZOLOFT ) 50 mg, Daily    traMADoL  (ULTRAM ) 50 mg, Oral, EVERY 4 HOURS PRN    vit C/E/Zn/coppr/lutein/zeaxan (PRESERVISION AREDS-2 ORAL) 1 Tablet, 2 TIMES DAILY    Xarelto  20 mg, Oral, EVERY EVENING AFTER DINNER        REVIEW OF SYSTEMS  Pertinent review of systems as discussed in Interval History.      Objective:     There were no vitals filed for this visit.            PHYSICAL EXAMINATION  Physical Exam  Constitutional:       Appearance: Normal appearance.   HENT:      Head: Normocephalic.   Eyes:      Extraocular Movements: Extraocular movements intact.      Pupils: Pupils are equal, round, and reactive to light.   Pulmonary:      Effort: Pulmonary effort is normal.   Musculoskeletal:         General: Normal range of motion.   Skin:     General: Skin is warm and dry.   Neurological:      General: No focal deficit present.      Mental Status: She is alert and oriented to person, place, and time.   Psychiatric:         Mood and Affect: Mood normal.         Behavior: Behavior normal.          LABS/IMAGING: All relevant labs and imaging were reviewed as per HPI.      Fairy Patten, MD 12/11/2023,  14:11    rr:[id number]@

## 2023-12-19 ENCOUNTER — Encounter (HOSPITAL_COMMUNITY): Payer: Self-pay

## 2023-12-19 ENCOUNTER — Other Ambulatory Visit: Payer: Self-pay

## 2023-12-19 ENCOUNTER — Ambulatory Visit
Admission: RE | Admit: 2023-12-19 | Discharge: 2023-12-19 | Disposition: A | Discharge status: Home / Self Care | Payer: Self-pay | Source: Ambulatory Visit | Attending: HEMATOLOGY-ONCOLOGY | Admitting: HEMATOLOGY-ONCOLOGY

## 2023-12-19 DIAGNOSIS — Z23 Encounter for immunization: Secondary | ICD-10-CM | POA: Insufficient documentation

## 2023-12-19 LAB — CBC WITH DIFF
BASOPHIL #: 0 x10ˆ3/uL (ref 0.00–0.10)
BASOPHIL %: 1 % (ref 0–1)
EOSINOPHIL #: 0.1 x10ˆ3/uL (ref 0.00–0.50)
EOSINOPHIL %: 2 % (ref 1–7)
HCT: 31.6 % (ref 31.2–41.9)
HGB: 10.8 g/dL — ABNORMAL LOW (ref 10.9–14.3)
LYMPHOCYTE #: 1.1 x10ˆ3/uL (ref 1.10–3.10)
LYMPHOCYTE %: 37 % (ref 16–46)
MCH: 29.2 pg (ref 24.7–32.8)
MCHC: 34.1 g/dL (ref 32.3–35.6)
MCV: 85.7 fL (ref 75.5–95.3)
MONOCYTE #: 0.3 x10ˆ3/uL (ref 0.20–0.90)
MONOCYTE %: 10 % (ref 4–11)
MPV: 9 fL (ref 7.9–10.8)
NEUTROPHIL #: 1.6 x10ˆ3/uL — ABNORMAL LOW (ref 1.90–8.20)
NEUTROPHIL %: 50 % (ref 43–77)
PLATELETS: 119 x10ˆ3/uL — ABNORMAL LOW (ref 140–440)
RBC: 3.69 x10ˆ6/uL (ref 3.63–4.92)
RDW: 14.7 % (ref 12.3–17.7)
WBC: 3.1 x10ˆ3/uL — ABNORMAL LOW (ref 3.8–11.8)

## 2023-12-19 LAB — COMPREHENSIVE METABOLIC PANEL, NON-FASTING
ALBUMIN/GLOBULIN RATIO: 1.3 (ref 0.8–1.4)
ALBUMIN: 3.6 g/dL (ref 3.5–5.7)
ALKALINE PHOSPHATASE: 83 U/L (ref 34–104)
ALT (SGPT): 20 U/L (ref 7–52)
ANION GAP: 3 mmol/L — ABNORMAL LOW (ref 4–13)
AST (SGOT): 28 U/L (ref 13–39)
BILIRUBIN TOTAL: 0.6 mg/dL (ref 0.3–1.0)
BUN/CREA RATIO: 15 (ref 6–22)
BUN: 18 mg/dL (ref 7–25)
CALCIUM, CORRECTED: 10.5 mg/dL (ref 8.9–10.8)
CALCIUM: 10.2 mg/dL (ref 8.6–10.3)
CHLORIDE: 105 mmol/L (ref 98–107)
CO2 TOTAL: 28 mmol/L (ref 21–31)
CREATININE: 1.2 mg/dL (ref 0.60–1.30)
ESTIMATED GFR: 45 mL/min/1.73mˆ2 — ABNORMAL LOW (ref >59)
GLOBULIN: 2.7 (ref 2.0–3.5)
GLUCOSE: 267 mg/dL — ABNORMAL HIGH (ref 74–109)
OSMOLALITY, CALCULATED: 283 mosm/kg (ref 270–290)
POTASSIUM: 4.6 mmol/L (ref 3.5–5.1)
PROTEIN TOTAL: 6.3 g/dL — ABNORMAL LOW (ref 6.4–8.9)
SODIUM: 136 mmol/L (ref 136–145)

## 2023-12-19 LAB — MAGNESIUM: MAGNESIUM: 1.6 mg/dL — ABNORMAL LOW (ref 1.9–2.7)

## 2023-12-19 MED ORDER — FLU VACC 2025-26(65YR UP)-MF59C(PF) 45 MCG(15 MCGX3)/0.5 ML IM SYRINGE
0.5000 mL | INJECTION | Freq: Once | INTRAMUSCULAR | Status: AC
Start: 2023-12-19 — End: 2023-12-19
  Administered 2023-12-19: 0.5 mL via INTRAMUSCULAR
  Filled 2023-12-19: qty 0.5

## 2023-12-19 MED ORDER — MAGNESIUM SULFATE 1 GRAM/100 ML IN DEXTROSE 5 % INTRAVENOUS PIGGYBACK
1.0000 g | INJECTION | INTRAVENOUS | Status: AC
Start: 2023-12-19 — End: 2023-12-19
  Administered 2023-12-19: 1 g via INTRAVENOUS
  Administered 2023-12-19: 0 g via INTRAVENOUS
  Administered 2023-12-19: 1 g via INTRAVENOUS
  Administered 2023-12-19: 0 g via INTRAVENOUS
  Filled 2023-12-19 (×2): qty 100

## 2023-12-19 NOTE — Nurses Notes (Signed)
 1229: Patient arrived to the floor ambulatory with use of a cane, accompanied by her spouse and her daughter. Patient taken to room 228 Chair B and assessment completed. Therisa Bachelor, RN  1330: R chest port site noted to be covered with clear op-site and EMLA  cream applied underneath. Area wiped cleaned with sterile gauze. Site prepped with chlorhexidine and PAC accessed at this time with huber needle; 21 gauge. PAC flushed without difficulty with immediate blood return noted. Site was dressed with transparent chlorhexidine dressing. PAC was accessed ustilizing sterile technique. Therisa Bachelor, RN  1346: First dose og magnesium  was started at this time. Therisa Bachelor, RN  [phone]: First dose of magnesium  completed. Second dose prepared. Therisa Bachelor, RN  [phone]: Second dose of magnesium  began. Therisa Bachelor, RN  [phone]: Patient consented/ requested to receive her Influenza vaccinae today prior to leaving. Injection given at this time in L deltoid without difficulty. Injection given using aeseptic technique. Therisa Bachelor, RN  [phone]: Magnesium  dose completed and flush of 50 ml began via pump. Therisa Bachelor, RN  1500: VS obtained at this time and PAC was deaccessed. Band-Aid applied to site afterwards. Therisa Bachelor, RN  [phone]: Patient discharged at this time. She left the unit in wheelchair, assisted by her spouse and daughter. Personal belongings taken with her. Patient verbalized that she did not feel that she was up to the walk back to the car. Therisa Bachelor, RN

## 2023-12-20 ENCOUNTER — Encounter (INDEPENDENT_AMBULATORY_CARE_PROVIDER_SITE_OTHER): Payer: Self-pay | Admitting: HEMATOLOGY-ONCOLOGY

## 2024-01-06 ENCOUNTER — Other Ambulatory Visit (INDEPENDENT_AMBULATORY_CARE_PROVIDER_SITE_OTHER): Payer: Self-pay | Admitting: HEMATOLOGY-ONCOLOGY

## 2024-01-09 ENCOUNTER — Ambulatory Visit (HOSPITAL_COMMUNITY): Payer: Self-pay

## 2024-01-09 ENCOUNTER — Other Ambulatory Visit: Payer: Self-pay

## 2024-01-09 ENCOUNTER — Ambulatory Visit
Admission: RE | Admit: 2024-01-09 | Discharge: 2024-01-09 | Disposition: A | Discharge status: Home / Self Care | Payer: Self-pay | Source: Ambulatory Visit | Attending: HEMATOLOGY-ONCOLOGY | Admitting: HEMATOLOGY-ONCOLOGY

## 2024-01-09 LAB — COMPREHENSIVE METABOLIC PANEL, NON-FASTING
ALBUMIN/GLOBULIN RATIO: 1.2 (ref 0.8–1.4)
ALBUMIN: 3.5 g/dL (ref 3.5–5.7)
ALKALINE PHOSPHATASE: 80 U/L (ref 34–104)
ALT (SGPT): 23 U/L (ref 7–52)
ANION GAP: 5 mmol/L (ref 4–13)
AST (SGOT): 28 U/L (ref 13–39)
BILIRUBIN TOTAL: 0.5 mg/dL (ref 0.3–1.0)
BUN/CREA RATIO: 16 (ref 6–22)
BUN: 17 mg/dL (ref 7–25)
CALCIUM, CORRECTED: 10.2 mg/dL (ref 8.9–10.8)
CALCIUM: 9.8 mg/dL (ref 8.6–10.3)
CHLORIDE: 108 mmol/L — ABNORMAL HIGH (ref 98–107)
CO2 TOTAL: 27 mmol/L (ref 21–31)
CREATININE: 1.09 mg/dL (ref 0.60–1.30)
ESTIMATED GFR: 51 mL/min/1.73mˆ2 — ABNORMAL LOW (ref >59)
GLOBULIN: 2.9 (ref 2.0–3.5)
GLUCOSE: 148 mg/dL — ABNORMAL HIGH (ref 74–109)
OSMOLALITY, CALCULATED: 284 mosm/kg (ref 270–290)
POTASSIUM: 4.1 mmol/L (ref 3.5–5.1)
PROTEIN TOTAL: 6.4 g/dL (ref 6.4–8.9)
SODIUM: 140 mmol/L (ref 136–145)

## 2024-01-09 LAB — CBC WITH DIFF
BASOPHIL #: 0 x10ˆ3/uL (ref 0.00–0.10)
BASOPHIL %: 1 % (ref 0–1)
EOSINOPHIL #: 0.1 x10ˆ3/uL (ref 0.00–0.50)
EOSINOPHIL %: 2 % (ref 1–7)
HCT: 31 % — ABNORMAL LOW (ref 31.2–41.9)
HGB: 10.4 g/dL — ABNORMAL LOW (ref 10.9–14.3)
LYMPHOCYTE #: 1.3 x10ˆ3/uL (ref 1.10–3.10)
LYMPHOCYTE %: 37 % (ref 16–46)
MCH: 28.8 pg (ref 24.7–32.8)
MCHC: 33.6 g/dL (ref 32.3–35.6)
MCV: 85.6 fL (ref 75.5–95.3)
MONOCYTE #: 0.3 x10ˆ3/uL (ref 0.20–0.90)
MONOCYTE %: 8 % (ref 4–11)
MPV: 8.9 fL (ref 7.9–10.8)
NEUTROPHIL #: 1.8 x10ˆ3/uL — ABNORMAL LOW (ref 1.90–8.20)
NEUTROPHIL %: 52 % (ref 43–77)
PLATELETS: 134 x10ˆ3/uL — ABNORMAL LOW (ref 140–440)
RBC: 3.62 x10ˆ6/uL — ABNORMAL LOW (ref 3.63–4.92)
RDW: 14.9 % (ref 12.3–17.7)
WBC: 3.5 x10ˆ3/uL — ABNORMAL LOW (ref 3.8–11.8)

## 2024-01-09 LAB — MAGNESIUM: MAGNESIUM: 1.6 mg/dL — ABNORMAL LOW (ref 1.9–2.7)

## 2024-01-09 MED ORDER — MAGNESIUM SULFATE 1 GRAM/100 ML IN DEXTROSE 5 % INTRAVENOUS PIGGYBACK
1.0000 g | INJECTION | INTRAVENOUS | Status: AC
Start: 2024-01-09 — End: 2024-01-09
  Administered 2024-01-09: 0 g via INTRAVENOUS
  Administered 2024-01-09: 1 g via INTRAVENOUS
  Administered 2024-01-09: 0 g via INTRAVENOUS
  Administered 2024-01-09: 1 g via INTRAVENOUS
  Filled 2024-01-09 (×2): qty 100

## 2024-01-09 NOTE — Nurses Notes (Signed)
 Summary- Patient arrived for labs/magnesium . PAC access per sterile tech. Labs obtained via PAC. Mag 1.6/ per order 2 grams magnesium  infused based on this level. PAC flushed with 20ml ns post infusion and PAC deaccessed. Site unremarkable. Patient denies any concerns or complaints. No s/s of distress noted. Patient left unit in wc accompanied by family.Lura Budge, LPN

## 2024-01-30 ENCOUNTER — Encounter (HOSPITAL_COMMUNITY): Payer: Self-pay

## 2024-01-30 ENCOUNTER — Ambulatory Visit
Admission: RE | Admit: 2024-01-30 | Discharge: 2024-01-30 | Disposition: A | Discharge status: Home / Self Care | Source: Ambulatory Visit | Attending: HEMATOLOGY-ONCOLOGY | Admitting: HEMATOLOGY-ONCOLOGY

## 2024-01-30 ENCOUNTER — Other Ambulatory Visit: Payer: Self-pay

## 2024-01-30 LAB — COMPREHENSIVE METABOLIC PANEL, NON-FASTING
ALBUMIN/GLOBULIN RATIO: 1.3 (ref 0.8–1.4)
ALBUMIN: 3.9 g/dL (ref 3.5–5.7)
ALKALINE PHOSPHATASE: 80 U/L (ref 34–104)
ALT (SGPT): 19 U/L (ref 7–52)
ANION GAP: 5 mmol/L (ref 4–13)
AST (SGOT): 30 U/L (ref 13–39)
BILIRUBIN TOTAL: 0.6 mg/dL (ref 0.3–1.0)
BUN/CREA RATIO: 15 (ref 6–22)
BUN: 17 mg/dL (ref 7–25)
CALCIUM, CORRECTED: 10.6 mg/dL (ref 8.9–10.8)
CALCIUM: 10.5 mg/dL — ABNORMAL HIGH (ref 8.6–10.3)
CHLORIDE: 104 mmol/L (ref 98–107)
CO2 TOTAL: 30 mmol/L (ref 21–31)
CREATININE: 1.1 mg/dL (ref 0.60–1.30)
ESTIMATED GFR: 50 mL/min/1.73mˆ2 — ABNORMAL LOW (ref >59)
GLOBULIN: 3 (ref 2.0–3.5)
GLUCOSE: 107 mg/dL (ref 74–109)
OSMOLALITY, CALCULATED: 280 mosm/kg (ref 270–290)
POTASSIUM: 4.4 mmol/L (ref 3.5–5.1)
PROTEIN TOTAL: 6.9 g/dL (ref 6.4–8.9)
SODIUM: 139 mmol/L (ref 136–145)

## 2024-01-30 LAB — CBC WITH DIFF
BASOPHIL #: 0 x10ˆ3/uL (ref 0.00–0.10)
BASOPHIL %: 1 % (ref 0–1)
EOSINOPHIL #: 0.1 x10ˆ3/uL (ref 0.00–0.50)
EOSINOPHIL %: 2 % (ref 1–7)
HCT: 33.5 % (ref 31.2–41.9)
HGB: 11.4 g/dL (ref 10.9–14.3)
LYMPHOCYTE #: 1.3 x10ˆ3/uL (ref 1.10–3.10)
LYMPHOCYTE %: 41 % (ref 16–46)
MCH: 28.8 pg (ref 24.7–32.8)
MCHC: 33.9 g/dL (ref 32.3–35.6)
MCV: 84.9 fL (ref 75.5–95.3)
MONOCYTE #: 0.2 x10ˆ3/uL (ref 0.20–0.90)
MONOCYTE %: 8 % (ref 4–11)
MPV: 9 fL (ref 7.9–10.8)
NEUTROPHIL #: 1.5 x10ˆ3/uL — ABNORMAL LOW (ref 1.90–8.20)
NEUTROPHIL %: 48 % (ref 43–77)
PLATELETS: 119 x10ˆ3/uL — ABNORMAL LOW (ref 140–440)
RBC: 3.94 x10ˆ6/uL (ref 3.63–4.92)
RDW: 14.7 % (ref 12.3–17.7)
WBC: 3.1 x10ˆ3/uL — ABNORMAL LOW (ref 3.8–11.8)

## 2024-01-30 LAB — MAGNESIUM: MAGNESIUM: 1.5 mg/dL — ABNORMAL LOW (ref 1.9–2.7)

## 2024-01-30 MED ORDER — MAGNESIUM SULFATE 1 GRAM/100 ML IN DEXTROSE 5 % INTRAVENOUS PIGGYBACK
1.0000 g | INJECTION | INTRAVENOUS | Status: AC
Start: 2024-01-30 — End: 2024-01-30
  Administered 2024-01-30: 0 g via INTRAVENOUS
  Administered 2024-01-30: 1 g via INTRAVENOUS
  Administered 2024-01-30: 0 g via INTRAVENOUS
  Administered 2024-01-30: 1 g via INTRAVENOUS
  Filled 2024-01-30 (×2): qty 100

## 2024-02-11 ENCOUNTER — Encounter (INDEPENDENT_AMBULATORY_CARE_PROVIDER_SITE_OTHER): Payer: Self-pay | Admitting: HEMATOLOGY-ONCOLOGY

## 2024-02-16 ENCOUNTER — Encounter (INDEPENDENT_AMBULATORY_CARE_PROVIDER_SITE_OTHER): Payer: Self-pay | Admitting: HEMATOLOGY-ONCOLOGY

## 2024-02-18 ENCOUNTER — Encounter (INDEPENDENT_AMBULATORY_CARE_PROVIDER_SITE_OTHER): Payer: Self-pay | Admitting: HEMATOLOGY-ONCOLOGY

## 2024-02-19 ENCOUNTER — Other Ambulatory Visit (INDEPENDENT_AMBULATORY_CARE_PROVIDER_SITE_OTHER): Payer: Self-pay | Admitting: HEMATOLOGY-ONCOLOGY

## 2024-02-19 ENCOUNTER — Encounter (INDEPENDENT_AMBULATORY_CARE_PROVIDER_SITE_OTHER): Payer: Self-pay | Admitting: HEMATOLOGY-ONCOLOGY

## 2024-02-20 ENCOUNTER — Ambulatory Visit (HOSPITAL_COMMUNITY): Payer: Self-pay

## 2024-02-23 ENCOUNTER — Other Ambulatory Visit: Payer: Self-pay

## 2024-02-23 ENCOUNTER — Ambulatory Visit
Admission: RE | Admit: 2024-02-23 | Discharge: 2024-02-23 | Disposition: A | Source: Ambulatory Visit | Attending: HEMATOLOGY-ONCOLOGY

## 2024-02-23 LAB — CBC WITH DIFF
BASOPHIL #: 0 x10ˆ3/uL (ref 0.00–0.10)
BASOPHIL %: 1 % (ref 0–1)
EOSINOPHIL #: 0.1 x10ˆ3/uL (ref 0.00–0.50)
EOSINOPHIL %: 2 % (ref 1–7)
HCT: 34.3 % (ref 31.2–41.9)
HGB: 11.5 g/dL (ref 10.9–14.3)
LYMPHOCYTE #: 1.5 x10ˆ3/uL (ref 1.10–3.10)
LYMPHOCYTE %: 37 % (ref 16–46)
MCH: 28.5 pg (ref 24.7–32.8)
MCHC: 33.7 g/dL (ref 32.3–35.6)
MCV: 84.7 fL (ref 75.5–95.3)
MONOCYTE #: 0.3 x10ˆ3/uL (ref 0.20–0.90)
MONOCYTE %: 8 % (ref 4–11)
MPV: 8.5 fL (ref 7.9–10.8)
NEUTROPHIL #: 2.1 x10ˆ3/uL (ref 1.90–8.20)
NEUTROPHIL %: 52 % (ref 43–77)
PLATELETS: 137 x10ˆ3/uL — ABNORMAL LOW (ref 140–440)
RBC: 4.04 x10ˆ6/uL (ref 3.63–4.92)
RDW: 15.2 % (ref 12.3–17.7)
WBC: 4 x10ˆ3/uL (ref 3.8–11.8)

## 2024-02-23 LAB — COMPREHENSIVE METABOLIC PANEL, NON-FASTING
ALBUMIN/GLOBULIN RATIO: 1.3 (ref 0.8–1.4)
ALBUMIN: 3.8 g/dL (ref 3.5–5.7)
ALKALINE PHOSPHATASE: 80 U/L (ref 34–104)
ALT (SGPT): 18 U/L (ref 7–52)
ANION GAP: 4 mmol/L (ref 4–13)
AST (SGOT): 27 U/L (ref 13–39)
BILIRUBIN TOTAL: 0.7 mg/dL (ref 0.3–1.0)
BUN/CREA RATIO: 14 (ref 6–22)
BUN: 15 mg/dL (ref 7–25)
CALCIUM, CORRECTED: 10.9 mg/dL — ABNORMAL HIGH (ref 8.9–10.8)
CALCIUM: 10.7 mg/dL — ABNORMAL HIGH (ref 8.6–10.3)
CHLORIDE: 104 mmol/L (ref 98–107)
CO2 TOTAL: 32 mmol/L — ABNORMAL HIGH (ref 21–31)
CREATININE: 1.05 mg/dL (ref 0.60–1.30)
ESTIMATED GFR: 53 mL/min/1.73mˆ2 — ABNORMAL LOW (ref >59)
GLOBULIN: 2.9 (ref 2.0–3.5)
GLUCOSE: 102 mg/dL (ref 74–109)
OSMOLALITY, CALCULATED: 281 mosm/kg (ref 270–290)
POTASSIUM: 4.3 mmol/L (ref 3.5–5.1)
PROTEIN TOTAL: 6.7 g/dL (ref 6.4–8.9)
SODIUM: 140 mmol/L (ref 136–145)

## 2024-02-23 LAB — MAGNESIUM: MAGNESIUM: 1.2 mg/dL — ABNORMAL LOW (ref 1.9–2.7)

## 2024-02-23 MED ORDER — MAGNESIUM SULFATE 2 GRAM/50 ML (4 %) IN WATER INTRAVENOUS PIGGYBACK
2.0000 g | INJECTION | INTRAVENOUS | Status: AC
  Administered 2024-02-23: 0 g via INTRAVENOUS
  Administered 2024-02-23: 2 g via INTRAVENOUS
  Filled 2024-02-23: qty 50

## 2024-02-23 MED ORDER — MAGNESIUM SULFATE 2 GRAM/50 ML (4 %) IN WATER INTRAVENOUS PIGGYBACK: 2.0000 g | INJECTION | Freq: Once | INTRAVENOUS | Status: DC

## 2024-02-23 NOTE — Nurses Notes (Signed)
 1022 - Pts labs resulted and mag level was 1.2, pt to room via wheelchair accompanied by daughter. Jonette Kitty, RN  [address] accessed using sterile technique, flushed without difficulty, blood return present, transparent non bordered dressing applied. Jonette Kitty, RN  1059 - 4 g mag infusion started at this time will continue to monitor. Jonette Kitty, RN  [phone] - Infusion complete, pt tolerated well, line flushing at this time, VS/S. Jonette Kitty, RN  1330 - Line flushed without difficulty, blood return present, port de accessed, pressure dressing applied. Jonette Kitty, RN  1400 - Pt left unit via wheelchair accompanied by family members without complaints. Jonette Kitty, RN

## 2024-02-23 NOTE — Nurses Notes (Signed)
 [id number] Patient came in via w/c with daughter for labs and possible infusion. Labs drawn peripherally by lab personnel. Magnesium  1.2. Patient went back to room with Jonette Kitty RN and her daughter to receive Magnesium  4 grams IV. Sharlet Montclair, RN

## 2024-02-27 ENCOUNTER — Encounter (INDEPENDENT_AMBULATORY_CARE_PROVIDER_SITE_OTHER): Payer: Self-pay | Admitting: HEMATOLOGY-ONCOLOGY

## 2024-02-27 ENCOUNTER — Ambulatory Visit (INDEPENDENT_AMBULATORY_CARE_PROVIDER_SITE_OTHER): Payer: Self-pay | Admitting: HEMATOLOGY-ONCOLOGY

## 2024-03-05 ENCOUNTER — Encounter (INDEPENDENT_AMBULATORY_CARE_PROVIDER_SITE_OTHER): Payer: Self-pay | Admitting: HEMATOLOGY-ONCOLOGY

## 2024-03-05 ENCOUNTER — Ambulatory Visit (INDEPENDENT_AMBULATORY_CARE_PROVIDER_SITE_OTHER): Payer: Self-pay

## 2024-03-05 ENCOUNTER — Ambulatory Visit (INDEPENDENT_AMBULATORY_CARE_PROVIDER_SITE_OTHER): Payer: Self-pay | Admitting: HEMATOLOGY-ONCOLOGY

## 2024-03-05 ENCOUNTER — Other Ambulatory Visit: Payer: Self-pay

## 2024-03-05 ENCOUNTER — Ambulatory Visit (HOSPITAL_COMMUNITY)
Admission: RE | Admit: 2024-03-05 | Discharge: 2024-03-05 | Disposition: A | Discharge status: Home / Self Care | Payer: Self-pay | Source: Ambulatory Visit | Attending: HEMATOLOGY-ONCOLOGY | Admitting: HEMATOLOGY-ONCOLOGY

## 2024-03-05 VITALS — HR 67 | Temp 98.3°F | Ht 64.0 in | Wt 191.5 lb

## 2024-03-05 DIAGNOSIS — C50919 Malignant neoplasm of unspecified site of unspecified female breast: Secondary | ICD-10-CM

## 2024-03-05 LAB — CBC
HCT: 34.8 % (ref 31.2–41.9)
HGB: 11.8 g/dL (ref 10.9–14.3)
MCH: 28.6 pg (ref 24.7–32.8)
MCHC: 33.8 g/dL (ref 32.3–35.6)
MCV: 84.5 fL (ref 75.5–95.3)
MPV: 9.3 fL (ref 7.9–10.8)
PLATELETS: 133 x10ˆ3/uL — ABNORMAL LOW (ref 140–440)
RBC: 4.12 x10ˆ6/uL (ref 3.63–4.92)
RDW: 14.9 % (ref 12.3–17.7)
WBC: 3.9 x10ˆ3/uL (ref 3.8–11.8)

## 2024-03-05 LAB — COMPREHENSIVE METABOLIC PANEL, NON-FASTING
ALBUMIN/GLOBULIN RATIO: 1.3 (ref 0.8–1.4)
ALBUMIN: 3.9 g/dL (ref 3.5–5.7)
ALKALINE PHOSPHATASE: 80 U/L (ref 34–104)
ALT (SGPT): 18 U/L (ref 7–52)
ANION GAP: 5 mmol/L (ref 4–13)
AST (SGOT): 26 U/L (ref 13–39)
BILIRUBIN TOTAL: 0.6 mg/dL (ref 0.3–1.0)
BUN/CREA RATIO: 15 (ref 6–22)
BUN: 18 mg/dL (ref 7–25)
CALCIUM, CORRECTED: 10.7 mg/dL (ref 8.9–10.8)
CALCIUM: 10.6 mg/dL — ABNORMAL HIGH (ref 8.6–10.3)
CHLORIDE: 105 mmol/L (ref 98–107)
CO2 TOTAL: 31 mmol/L (ref 21–31)
CREATININE: 1.18 mg/dL (ref 0.60–1.30)
ESTIMATED GFR: 46 mL/min/1.73mˆ2 — ABNORMAL LOW (ref >59)
GLOBULIN: 3 (ref 2.0–3.5)
GLUCOSE: 129 mg/dL — ABNORMAL HIGH (ref 74–109)
OSMOLALITY, CALCULATED: 285 mosm/kg (ref 270–290)
POTASSIUM: 4 mmol/L (ref 3.5–5.1)
PROTEIN TOTAL: 6.9 g/dL (ref 6.4–8.9)
SODIUM: 141 mmol/L (ref 136–145)

## 2024-03-05 LAB — MAGNESIUM: MAGNESIUM: 1.2 mg/dL — ABNORMAL LOW (ref 1.9–2.7)

## 2024-03-05 MED ORDER — MAGNESIUM SULFATE 2 GRAM/50 ML IN WATER IVPB PREMIX - Q1H X2 DEFAULT
4.0000 g | INJECTION | Freq: Once | INTRAVENOUS | Status: AC
  Administered 2024-03-05: 4 g via INTRAVENOUS
  Administered 2024-03-05: 0 g via INTRAVENOUS
  Filled 2024-03-05: qty 100

## 2024-03-05 NOTE — Nursing Note (Signed)
 Patient in room. Patient has no complaints or concerns at this time. Patient wants to wait until after christmas. Went over labs. All questions answered. Patient states that mag PO causes her to have diarrhea, so patient is going to try another PO mag. Continue current treatment.

## 2024-03-05 NOTE — Nurses Notes (Signed)
 1235- 1610: Arrived to outpatient oncology via wheelchair with family. Here today for Magnesium  infusion. Magnesium  level 1.2. Port to right chest accessed without difficulty. Flushes easily. Excellent blood return noted. Magnesium  4 gram infusion given over 2 hours and line flushed. Port flushed and de-accessed per protocol. Pressure dressing applied. No bleeding noted at site. Out of department via wheelchair with family. Delon Larve, RN

## 2024-03-05 NOTE — Progress Notes (Signed)
 Department of Hematology/Oncology  Progress Note   Name: Kelsey Preston  FMW:[id number]  Date of Birth: [birth year]  Encounter Date: 03/05/2024    REFERRING PROVIDER:  Andra Pastor, MD  [address]  [place],  [place] [address]    TELEMEDICINE DOCUMENTATION:  Patient Location:  [facility], [facility] outpatient Hematology/Oncology [address], [place] [address]  Patient/family aware of provider location:  yes  Patient/family consent for telemedicine:  yes    REASON FOR OFFICE VISIT:  Breast Cancer     HISTORY OF PRESENT ILLNESS:  Kelsey Preston is a 83 y.o. female who presents today for follow up of breast cancer     The patient has a history of non-small-cell lung cancer, status post lobectomy in 2014.      She also has a history of endometrial cancer, and is status post surgical resection of that cancer as well.    More recently, the patient was found to have a left axillary lesion measuring 2.3 cm on PET-CT scan.  For some reason, I can not find an actual size measurement on the pathology report.  Multiple imaging studies of the breast were performed, but no primary lesion was found.  The malignant lesion was definitively classified as a lymph node.    Receptor staining showed that it was ER negative and HER2 Neu positive.  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    09/10/2022: The patient is here for follow up of early stage breast cancer.  She is doing well and denies any new problems at this time.    09/27/2022: The patient is here for follow up breast cancer.  She is doing well at this time and has no new complaints.    10/18/2022: the patient is here for follow up of breast cancer. She has been on treatment with paclitaxel  and trastuzumab . She has been hospitalized since the last visit due to neutropenia and hypotension. She has also had electrolyte issues, with her magnesium  level being somewhat low. She is taking over-the-counter magnesium  oxide.    11/08/2022: The patient is here for  follow up of breast cancer.  She has been on treatment with weekly paclitaxel  and trastuzumab .  She did end up in the hospital relatively recently with some low cell counts, but it appears to have resolved.    12/06/2022: The patient is here for follow up of breast cancer.  She has been doing relatively well but has noticed some peripheral neuropathy.    02/11/2023: The patient is here for follow up of breast cancer.  She was on trastuzumab , and it was held due to a percentage decline in her ejection fraction.  She had a recent follow up echocardiogram which showed complete recovery.    11/07/2023: The patient is here for follow up of breast cancer.  She has a couple of treatments of trastuzumab  remaining.  She denies any problems at this time.  She is inquiring about being able to discontinue her anticoagulant because she had a recent lower extremity Doppler that showed no evidence of residual clot.    11/28/2023: The patient is here for follow up of breast cancer.  She denies any issues at this time.    03/05/2024: Patient is here for follow up breast cancer.  She denies any new issues to that has been receiving magnesium  infusions quite regularly, and states she has been avoiding oral replacement because diarrhea associated with magnesium  citrate.    ROS:   Pertinent review of systems as  discussed in HPI    HISTORY:  Past Medical History:   Diagnosis Date    Diabetes mellitus, type 2     Embolism (CMS HCC)     left leg    Endometrial cancer     HTN (hypertension)     Hx of breast cancer     Lung cancer     Macular degeneration (senile) of retina          Past Surgical History:   Procedure Laterality Date    HIP SURGERY Left     HX CHOLECYSTECTOMY      HX COLONOSCOPY      HX HYSTERECTOMY      HX LOBECTOMY Left     LUNG CANCER SURGERY      PORTACATH PLACEMENT           Social History     Socioeconomic History    Marital status: Married     Spouse name: Not on file    Number of children: Not on file    Years of  education: Not on file    Highest education level: Not on file   Occupational History    Not on file   Tobacco Use    Smoking status: Never    Smokeless tobacco: Never   Vaping Use    Vaping status: Never Used   Substance and Sexual Activity    Alcohol  use: Never    Drug use: Never    Sexual activity: Not Currently   Other Topics Concern    Not on file   Social History Narrative    Not on file     Social Determinants of Health     Financial Resource Strain: Medium Risk (01/29/2023)    Received from [facility]    Overall Financial Resource Strain (CARDIA)     Difficulty of Paying Living Expenses: Somewhat hard   Transportation Needs: No Transportation Needs (01/29/2023)    Received from [facility] - Transportation     Lack of Transportation (Medical): No     Lack of Transportation (Non-Medical): No   Social Connections: Moderately Integrated (01/29/2023)    Received from [facility]    Social Connection and Isolation Panel     In a typical week, how many times do you talk on the phone with family, friends, or neighbors?: More than three times a week     How often do you get together with friends or relatives?: More than three times a week     How often do you attend church or religious services?: More than 4 times per year     Do you belong to any clubs or organizations such as church groups, unions, fraternal or athletic groups, or school groups?: No     How often do you attend meetings of the clubs or organizations you belong to?: Never     Are you married, widowed, divorced, separated, never married, or living with a partner?: Married   Intimate Partner Violence: Not At Risk (01/29/2023)    Received from [employer], Afraid, Rape, and Kick questionnaire     Within the last year, have you been afraid of your partner or ex-partner?: No     Within the last year, have you been humiliated or emotionally abused in other ways by your partner or ex-partner?: No     Within the  last year, have you been kicked, hit, slapped, or otherwise physically hurt by  your partner or ex-partner?: No     Within the last year, have you been raped or forced to have any kind of sexual activity by your partner or ex-partner?: No   Housing Stability: Low Risk  (01/29/2023)    Received from [facility] Stability Vital Sign     In the last 12 months, was there a time when you were not able to pay the mortgage or rent on time?: No     In the past 12 months, how many times have you moved where you were living?: 1     At any time in the past 12 months, were you homeless or living in a shelter (including now)?: No     Family Medical History:       Problem Relation (Age of Onset)    Cancer Brother            Current Outpatient Medications   Medication Sig    atorvastatin  (LIPITOR) 40 mg Oral Tablet Take 1 Tablet (40 mg total) by mouth Every evening for 30 days    carvediloL  (COREG ) 12.5 mg Oral Tablet Take 1 Tablet (12.5 mg total) by mouth Twice daily    cholecalciferol , vitamin D3, 1,250 mcg (50,000 unit) Oral Capsule Take 1 Capsule (50,000 Units total) by mouth Every 7 days    ergocalciferol, vitamin D2, (DRISDOL) 1,250 mcg (50,000 unit) Oral Capsule Take 1 Capsule (50,000 Units total) by mouth Every 7 days    esomeprazole magnesium  (NEXIUM) 40 mg Oral Capsule, Delayed Release(E.C.) Take 1 Capsule (40 mg total) by mouth Every morning before breakfast    ezetimibe  (ZETIA ) 10 mg Oral Tablet Take 1 Tablet (10 mg total) by mouth Every evening    famotidine  (PEPCID ) 40 mg Oral Tablet Take 1 Tablet (40 mg total) by mouth Twice daily    gabapentin  (NEURONTIN ) 100 mg Oral Capsule Take 1 Capsule (100 mg total) by mouth Every night    lidocaine -prilocaine  (EMLA ) 2.5-2.5 % Cream One time    magnesium  oxide (MAG-OX) 400 mg Oral Tablet Take 1 Tablet (400 mg total) by mouth Twice daily (Patient taking differently: Take 1 Tablet (400 mg total) by mouth Daily)    metFORMIN (GLUCOPHAGE XR) 500 mg Oral Tablet  Sustained Release 24 hr Take 1 Tablet (500 mg total) by mouth Twice daily    multivitamin with iron Oral Tablet Take 1 Tablet by mouth Daily    ondansetron  (ZOFRAN ) 4 mg Oral Tablet Take 1 Tablet (4 mg total) by mouth Every 6 hours as needed for Nausea/Vomiting Indications: prevent nausea and vomiting from cancer chemotherapy    sertraline  (ZOLOFT ) 50 mg Oral Tablet Take 1 Tablet (50 mg total) by mouth Daily    traMADoL  (ULTRAM ) 50 mg Oral Tablet Take 1 Tablet (50 mg total) by mouth Every 4 hours as needed for Pain    vit C/E/Zn/coppr/lutein /zeaxan (PRESERVISION AREDS-2 ORAL) Take 1 Tablet by mouth Twice daily    XARELTO  20 mg Oral Tablet TAKE 1 TABLET BY MOUTH EVERY EVENING WITH DINNER     Allergies   Allergen Reactions    Adhesive Rash    Ceclor [Cefaclor]  Other Adverse Reaction (Add comment)     Pt states a Ceclor pill gave her blisters in her mouth.       PHYSICAL EXAM:  Most Recent Vitals    Flowsheet Row Telemedicine from 09/02/2022 in Hematology/Oncology, [facility]   Temperature 36.3 C (97.3 F) filed at... 09/02/2022 9088  Heart Rate 77 filed at... 09/02/2022 0911   Respiratory Rate --   BP (Non-Invasive) 174/91 filed at... 09/02/2022 0911   SpO2 95 % filed at... 09/02/2022 0911   Height 1.626 m (5' 4) filed at... 09/02/2022 0911   Weight 98.2 kg (216 lb 6.4 oz) filed at... 09/02/2022 0911   BMI (Calculated) 37.22 filed at... 09/02/2022 0911   BSA (Calculated) 2.11 filed at... 09/02/2022 0911      ECOG Status: (1) Restricted in physically strenuous activity, ambulatory and able to do work of light nature   Physical Exam    DIAGNOSTIC DATA:  No results found for this or any previous visit (from the past 82479 hours).    LABS:   CBC  Diff   Lab Results   Component Value Date/Time    WBC 3.9 03/05/2024 11:31 AM    HGB 11.8 03/05/2024 11:31 AM    HCT 34.8 03/05/2024 11:31 AM    PLTCNT 133 (L) 03/05/2024 11:31 AM    RBC 4.12 03/05/2024 11:31 AM    MCV 84.5 03/05/2024 11:31 AM    MCHC 33.8 03/05/2024  11:31 AM    MCH 28.6 03/05/2024 11:31 AM    RDW 14.9 03/05/2024 11:31 AM    MPV 9.3 03/05/2024 11:31 AM    Lab Results   Component Value Date/Time    PMNS 52 02/23/2024 09:52 AM    LYMPHOCYTES 37 02/23/2024 09:52 AM    EOSINOPHIL 2 02/23/2024 09:52 AM    MONOCYTES 8 02/23/2024 09:52 AM    BASOPHILS 1 02/23/2024 09:52 AM    BASOPHILS 0.00 02/23/2024 09:52 AM    PMNABS 2.10 02/23/2024 09:52 AM    LYMPHSABS 1.50 02/23/2024 09:52 AM    EOSABS 0.10 02/23/2024 09:52 AM    MONOSABS 0.30 02/23/2024 09:52 AM    BASABS 0.04 05/30/2022 05:40 AM            Comprehensive Metabolic Profile    Lab Results   Component Value Date    SODIUM 141 03/05/2024    POTASSIUM 4.0 03/05/2024    CHLORIDE 105 03/05/2024    CO2 31 03/05/2024    ANIONGAP 5 03/05/2024    BUN 18 03/05/2024    CREATININE 1.18 03/05/2024    ALBUMIN 3.9 03/05/2024    CALCIUM 10.6 (H) 03/05/2024    GLUCOSENF 129 (H) 03/05/2024    ALKPHOS 80 03/05/2024    ALT 18 03/05/2024    AST 26 03/05/2024    TOTBILIRUBIN 0.6 03/05/2024    TOTALPROTEIN 6.9 03/05/2024          BASIC METABOLIC PANEL  Lab Results   Component Value Date    SODIUM 141 03/05/2024    POTASSIUM 4.0 03/05/2024    CHLORIDE 105 03/05/2024    CO2 31 03/05/2024    ANIONGAP 5 03/05/2024    BUN 18 03/05/2024    CREATININE 1.18 03/05/2024    BUNCRRATIO 15 03/05/2024    GFR 46 (L) 03/05/2024    CALCIUM 10.6 (H) 03/05/2024    GLUCOSENF 129 (H) 03/05/2024           ASSESSMENT:  Problem List Items Addressed This Visit          Oncology    Breast cancer - Primary    Relevant Orders    COMPREHENSIVE METABOLIC PANEL, NON-FASTING    CBC    CA 27,29       Other    Hypomagnesemia    Relevant Orders    COMPREHENSIVE METABOLIC PANEL, NON-FASTING  CBC    CA 27,29        ICD-10-CM    1. Breast cancer  C50.919 COMPREHENSIVE METABOLIC PANEL, NON-FASTING     CBC     CA 27,29      2. Hypomagnesemia  E83.42 COMPREHENSIVE METABOLIC PANEL, NON-FASTING     CBC     CA 27,29           PLAN:   1. All relevant medical records were  reviewed including available pertinent provider notes, procedure notes, imaging, laboratory, and pathology.   2. All pertinent labs and/or imaging were reviewed with the patient.   3. Breast cancer:  Status post resection of an involved lymph node that was 2.3 cm on PET/CT.  No primary lesion was seen on imaging studies.  T0 N1 M0, stage IIA.  ER negative, HER2 Neu positive.  After resection of the node, she was rendered cancer-free radiographically.  We will continue with surveillance, and I will see her back in three months with laboratory studies.  4. History of lung cancer:  Continue surveillance.    5. History of gynecologic cancer: Continue surveillance as well.  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  7. History osteoporosis:  She would like to delay the denosumab  infusion until after the holidays because she states that it causes some discomforts.    8. Low magnesium : We discussed that there are different formulations of magnesium  that she can try, such as magnesium  glycinate.     Kelsey Preston was given the chance to ask questions, and these were answered to their satisfaction. The patient is welcome to call with any questions or concerns in the meantime.     On the day of the encounter, a total of 35 minutes was spent on this patient encounter including review of historical information, examination, documentation and post-visit activities.   Return in about 3 months (around 06/03/2024).     Lynwood Pipes, MD  03/05/2024 , 12:23  The patient was seen as part of a collaborative telemedicine service with Dr. Pipes who participated in the encounter by active presence via approved video/audio means for portions of the encounter.    The patient's insurance company bears full legal and financial responsibility resulting from any deviations that they cause to my recommended treatment plan.    CC:  Manus Endo, MD  [address]  [place] [address]    Endo Manus, MD  [address]  [place],  [place] [address]    This note was partially generated using MModal Fluency Direct system, and there may be some incorrect words, spellings, and punctuation that were not noted in checking the note before saving.

## 2024-03-07 ENCOUNTER — Inpatient Hospital Stay
Admission: EM | Admit: 2024-03-07 | Discharge: 2024-03-09 | DRG: 369 | Disposition: A | Discharge status: Home / Self Care | Source: Skilled Nursing Facility | Attending: HOSPITALIST | Admitting: HOSPITALIST

## 2024-03-07 ENCOUNTER — Emergency Department (HOSPITAL_COMMUNITY)

## 2024-03-07 ENCOUNTER — Other Ambulatory Visit: Payer: Self-pay

## 2024-03-07 ENCOUNTER — Inpatient Hospital Stay (HOSPITAL_COMMUNITY): Admitting: Internal Medicine

## 2024-03-07 DIAGNOSIS — Z79899 Other long term (current) drug therapy: Secondary | ICD-10-CM

## 2024-03-07 DIAGNOSIS — K449 Diaphragmatic hernia without obstruction or gangrene: Secondary | ICD-10-CM | POA: Diagnosis present

## 2024-03-07 DIAGNOSIS — Z86718 Personal history of other venous thrombosis and embolism: Secondary | ICD-10-CM

## 2024-03-07 DIAGNOSIS — K5900 Constipation, unspecified: Secondary | ICD-10-CM | POA: Diagnosis present

## 2024-03-07 DIAGNOSIS — M810 Age-related osteoporosis without current pathological fracture: Secondary | ICD-10-CM | POA: Diagnosis present

## 2024-03-07 DIAGNOSIS — Z8542 Personal history of malignant neoplasm of other parts of uterus: Secondary | ICD-10-CM

## 2024-03-07 DIAGNOSIS — M7989 Other specified soft tissue disorders: Secondary | ICD-10-CM

## 2024-03-07 DIAGNOSIS — I493 Ventricular premature depolarization: Secondary | ICD-10-CM | POA: Diagnosis present

## 2024-03-07 DIAGNOSIS — Z7984 Long term (current) use of oral hypoglycemic drugs: Secondary | ICD-10-CM

## 2024-03-07 DIAGNOSIS — N134 Hydroureter: Secondary | ICD-10-CM

## 2024-03-07 DIAGNOSIS — D696 Thrombocytopenia, unspecified: Secondary | ICD-10-CM | POA: Diagnosis present

## 2024-03-07 DIAGNOSIS — I10 Essential (primary) hypertension: Secondary | ICD-10-CM | POA: Diagnosis present

## 2024-03-07 DIAGNOSIS — R001 Bradycardia, unspecified: Secondary | ICD-10-CM | POA: Diagnosis present

## 2024-03-07 DIAGNOSIS — Z87442 Personal history of urinary calculi: Secondary | ICD-10-CM

## 2024-03-07 DIAGNOSIS — Y92009 Unspecified place in unspecified non-institutional (private) residence as the place of occurrence of the external cause: Secondary | ICD-10-CM

## 2024-03-07 DIAGNOSIS — Z860100 Personal history of colon polyps, unspecified: Secondary | ICD-10-CM

## 2024-03-07 DIAGNOSIS — K635 Polyp of colon: Secondary | ICD-10-CM | POA: Diagnosis present

## 2024-03-07 DIAGNOSIS — K2101 Gastro-esophageal reflux disease with esophagitis, with bleeding: Principal | ICD-10-CM | POA: Diagnosis present

## 2024-03-07 DIAGNOSIS — I82890 Acute embolism and thrombosis of other specified veins: Secondary | ICD-10-CM

## 2024-03-07 DIAGNOSIS — Z171 Estrogen receptor negative status [ER-]: Secondary | ICD-10-CM

## 2024-03-07 DIAGNOSIS — Z9221 Personal history of antineoplastic chemotherapy: Secondary | ICD-10-CM

## 2024-03-07 DIAGNOSIS — N281 Cyst of kidney, acquired: Secondary | ICD-10-CM | POA: Diagnosis present

## 2024-03-07 DIAGNOSIS — Z1731 Human epidermal growth factor receptor 2 positive status: Secondary | ICD-10-CM

## 2024-03-07 DIAGNOSIS — N132 Hydronephrosis with renal and ureteral calculous obstruction: Secondary | ICD-10-CM | POA: Diagnosis present

## 2024-03-07 DIAGNOSIS — Z59868 Other specified financial insecurity: Secondary | ICD-10-CM

## 2024-03-07 DIAGNOSIS — E278 Other specified disorders of adrenal gland: Secondary | ICD-10-CM

## 2024-03-07 DIAGNOSIS — Z7901 Long term (current) use of anticoagulants: Secondary | ICD-10-CM

## 2024-03-07 DIAGNOSIS — I82409 Acute embolism and thrombosis of unspecified deep veins of unspecified lower extremity: Secondary | ICD-10-CM

## 2024-03-07 DIAGNOSIS — Z853 Personal history of malignant neoplasm of breast: Secondary | ICD-10-CM

## 2024-03-07 DIAGNOSIS — E785 Hyperlipidemia, unspecified: Secondary | ICD-10-CM | POA: Diagnosis present

## 2024-03-07 DIAGNOSIS — Z86711 Personal history of pulmonary embolism: Secondary | ICD-10-CM

## 2024-03-07 DIAGNOSIS — R32 Unspecified urinary incontinence: Secondary | ICD-10-CM | POA: Diagnosis present

## 2024-03-07 DIAGNOSIS — K2970 Gastritis, unspecified, without bleeding: Secondary | ICD-10-CM | POA: Diagnosis present

## 2024-03-07 DIAGNOSIS — D709 Neutropenia, unspecified: Secondary | ICD-10-CM | POA: Diagnosis present

## 2024-03-07 DIAGNOSIS — Z85118 Personal history of other malignant neoplasm of bronchus and lung: Secondary | ICD-10-CM

## 2024-03-07 DIAGNOSIS — K229 Disease of esophagus, unspecified: Secondary | ICD-10-CM

## 2024-03-07 DIAGNOSIS — K621 Rectal polyp: Secondary | ICD-10-CM | POA: Diagnosis present

## 2024-03-07 DIAGNOSIS — K625 Hemorrhage of anus and rectum: Secondary | ICD-10-CM | POA: Diagnosis present

## 2024-03-07 DIAGNOSIS — C50919 Malignant neoplasm of unspecified site of unspecified female breast: Secondary | ICD-10-CM

## 2024-03-07 DIAGNOSIS — N2889 Other specified disorders of kidney and ureter: Secondary | ICD-10-CM | POA: Diagnosis present

## 2024-03-07 DIAGNOSIS — K922 Gastrointestinal hemorrhage, unspecified: Principal | ICD-10-CM

## 2024-03-07 DIAGNOSIS — E119 Type 2 diabetes mellitus without complications: Secondary | ICD-10-CM | POA: Diagnosis present

## 2024-03-07 DIAGNOSIS — E669 Obesity, unspecified: Secondary | ICD-10-CM | POA: Diagnosis present

## 2024-03-07 DIAGNOSIS — K640 First degree hemorrhoids: Secondary | ICD-10-CM | POA: Diagnosis present

## 2024-03-07 DIAGNOSIS — Z8711 Personal history of peptic ulcer disease: Secondary | ICD-10-CM

## 2024-03-07 LAB — CBC
HCT: 32.2 % (ref 31.2–41.9)
HCT: 33.5 % (ref 31.2–41.9)
HGB: 10.8 g/dL — ABNORMAL LOW (ref 10.9–14.3)
HGB: 11.3 g/dL (ref 10.9–14.3)
MCH: 28.3 pg (ref 24.7–32.8)
MCH: 28.3 pg (ref 24.7–32.8)
MCHC: 33.6 g/dL (ref 32.3–35.6)
MCHC: 33.7 g/dL (ref 32.3–35.6)
MCV: 84.2 fL (ref 75.5–95.3)
MCV: 84 fL (ref 75.5–95.3)
MPV: 8.9 fL (ref 7.9–10.8)
MPV: 8.7 fL (ref 7.9–10.8)
PLATELETS: 112 x10ˆ3/uL — ABNORMAL LOW (ref 140–440)
PLATELETS: 110 x10ˆ3/uL — ABNORMAL LOW (ref 140–440)
RBC: 3.83 x10ˆ6/uL (ref 3.63–4.92)
RBC: 3.99 x10ˆ6/uL (ref 3.63–4.92)
RDW: 14.9 % (ref 12.3–17.7)
RDW: 14.8 % (ref 12.3–17.7)
WBC: 3.4 x10ˆ3/uL — ABNORMAL LOW (ref 3.8–11.8)
WBC: 3.8 x10ˆ3/uL (ref 3.8–11.8)

## 2024-03-07 LAB — BASIC METABOLIC PANEL
ANION GAP: 3 mmol/L — ABNORMAL LOW (ref 4–13)
BUN/CREA RATIO: 15 (ref 6–22)
BUN: 16 mg/dL (ref 7–25)
CALCIUM: 10.4 mg/dL — ABNORMAL HIGH (ref 8.6–10.3)
CHLORIDE: 104 mmol/L (ref 98–107)
CO2 TOTAL: 33 mmol/L — ABNORMAL HIGH (ref 21–31)
CREATININE: 1.1 mg/dL (ref 0.60–1.30)
ESTIMATED GFR: 50 mL/min/1.73mˆ2 — ABNORMAL LOW (ref >59)
GLUCOSE: 209 mg/dL — ABNORMAL HIGH (ref 74–109)
OSMOLALITY, CALCULATED: 287 mosm/kg (ref 270–290)
POTASSIUM: 4.7 mmol/L (ref 3.5–5.1)
SODIUM: 140 mmol/L (ref 136–145)

## 2024-03-07 LAB — CBC WITH DIFF
BASOPHIL #: 0 x10ˆ3/uL (ref 0.00–0.10)
BASOPHIL %: 1 % (ref 0–1)
EOSINOPHIL #: 0.1 x10ˆ3/uL (ref 0.00–0.50)
EOSINOPHIL %: 2 % (ref 1–7)
HCT: 32.8 % (ref 31.2–41.9)
HGB: 10.9 g/dL (ref 10.9–14.3)
LYMPHOCYTE #: 1.5 x10ˆ3/uL (ref 1.10–3.10)
LYMPHOCYTE %: 42 % (ref 16–46)
MCH: 28.3 pg (ref 24.7–32.8)
MCHC: 33.3 g/dL (ref 32.3–35.6)
MCV: 84.9 fL (ref 75.5–95.3)
MONOCYTE #: 0.3 x10ˆ3/uL (ref 0.20–0.90)
MONOCYTE %: 9 % (ref 4–11)
MPV: 9.1 fL (ref 7.9–10.8)
NEUTROPHIL #: 1.6 x10ˆ3/uL — ABNORMAL LOW (ref 1.90–8.20)
NEUTROPHIL %: 46 % (ref 43–77)
PLATELETS: 126 x10ˆ3/uL — ABNORMAL LOW (ref 140–440)
RBC: 3.87 x10ˆ6/uL (ref 3.63–4.92)
RDW: 15 % (ref 12.3–17.7)
WBC: 3.6 x10ˆ3/uL — ABNORMAL LOW (ref 3.8–11.8)

## 2024-03-07 LAB — MAGNESIUM: MAGNESIUM: 1.5 mg/dL — ABNORMAL LOW (ref 1.9–2.7)

## 2024-03-07 LAB — CA 27,29: CA 27.29: 20 U/mL (ref <38)

## 2024-03-07 MED ORDER — FAMOTIDINE 40 MG TABLET
ORAL_TABLET | ORAL | Status: AC
  Filled 2024-03-07: qty 1

## 2024-03-07 MED ORDER — PANTOPRAZOLE 40 MG TABLET,DELAYED RELEASE
40.0000 mg | DELAYED_RELEASE_TABLET | Freq: Every day | ORAL | Status: DC
  Administered 2024-03-08: 0 mg via ORAL
  Administered 2024-03-09: 40 mg via ORAL
  Filled 2024-03-07 (×2): qty 1

## 2024-03-07 MED ORDER — HYDRALAZINE 20 MG/ML INJECTION SOLUTION
5.0000 mg | INTRAMUSCULAR | Status: AC
  Administered 2024-03-07: 5 mg via INTRAVENOUS
  Filled 2024-03-07: qty 1

## 2024-03-07 MED ORDER — ATORVASTATIN 40 MG TABLET
40.0000 mg | ORAL_TABLET | Freq: Every evening | ORAL | Status: DC
  Administered 2024-03-07 – 2024-03-08 (×2): 40 mg via ORAL
  Filled 2024-03-07: qty 1

## 2024-03-07 MED ORDER — ATORVASTATIN 40 MG TABLET
ORAL_TABLET | ORAL | Status: AC
  Filled 2024-03-07: qty 1

## 2024-03-07 MED ORDER — SERTRALINE 50 MG TABLET
ORAL_TABLET | ORAL | Status: AC
  Filled 2024-03-07: qty 1

## 2024-03-07 MED ORDER — VIT C 250 MG-E 90 MG-ZINC 40 MG-COPPER 1 MG-LUTEIN-ZEAXAN CHEW TABLET
1.0000 | CHEWABLE_TABLET | Freq: Two times a day (BID) | ORAL | Status: DC
  Administered 2024-03-07 – 2024-03-09 (×4): 0 via ORAL

## 2024-03-07 MED ORDER — GABAPENTIN 100 MG CAPSULE
100.0000 mg | ORAL_CAPSULE | Freq: Every evening | ORAL | Status: DC
  Administered 2024-03-07: 100 mg via ORAL

## 2024-03-07 MED ORDER — SERTRALINE 50 MG TABLET
50.0000 mg | ORAL_TABLET | Freq: Every day | ORAL | Status: DC
  Administered 2024-03-07 – 2024-03-09 (×3): 50 mg via ORAL
  Filled 2024-03-07 (×2): qty 1

## 2024-03-07 MED ORDER — PEG 3350-ELECTROLYTES 236 GRAM-22.74 GRAM-6.74 GRAM-5.86 GRAM SOLUTION
1.0000 L | Freq: Once | ORAL | Status: AC
  Administered 2024-03-07: 1000 mL via ORAL
  Filled 2024-03-07: qty 4000

## 2024-03-07 MED ORDER — MULTIVITAMIN WITH FOLIC ACID 400 MCG TABLET
1.0000 | ORAL_TABLET | Freq: Every day | ORAL | Status: DC
  Administered 2024-03-07 – 2024-03-09 (×3): 1 via ORAL
  Filled 2024-03-07 (×2): qty 1

## 2024-03-07 MED ORDER — MULTIVITAMIN WITH FOLIC ACID 400 MCG TABLET
ORAL_TABLET | ORAL | Status: AC
  Filled 2024-03-07: qty 1

## 2024-03-07 MED ORDER — FAMOTIDINE 40 MG TABLET
40.0000 mg | ORAL_TABLET | Freq: Two times a day (BID) | ORAL | Status: DC
  Administered 2024-03-07 – 2024-03-09 (×4): 40 mg via ORAL
  Filled 2024-03-07 (×3): qty 1

## 2024-03-07 MED ORDER — CARVEDILOL 6.25 MG TABLET
12.5000 mg | ORAL_TABLET | Freq: Two times a day (BID) | ORAL | Status: DC
  Administered 2024-03-07: 12.5 mg via ORAL
  Administered 2024-03-08: 0 mg via ORAL
  Filled 2024-03-07: qty 2

## 2024-03-07 MED ORDER — IOHEXOL 350 MG IODINE/ML INTRAVENOUS SOLUTION
100.0000 mL | INTRAVENOUS | Status: AC
  Administered 2024-03-07: 100 mL via INTRAVENOUS

## 2024-03-07 MED ORDER — ONDANSETRON HCL 4 MG TABLET
4.0000 mg | ORAL_TABLET | Freq: Four times a day (QID) | ORAL | Status: DC | PRN
  Filled 2024-03-07: qty 1

## 2024-03-07 MED ORDER — MAGNESIUM SULFATE 1 GRAM/100 ML IN DEXTROSE 5 % INTRAVENOUS PIGGYBACK
1.0000 g | INJECTION | INTRAVENOUS | Status: AC
  Administered 2024-03-07 (×2): 1 g via INTRAVENOUS
  Administered 2024-03-07 (×2): 0 g via INTRAVENOUS

## 2024-03-07 MED ORDER — ONDANSETRON 4 MG DISINTEGRATING TABLET
4.0000 mg | ORAL_TABLET | Freq: Four times a day (QID) | ORAL | Status: DC | PRN
  Administered 2024-03-08 (×2): 4 mg via ORAL
  Filled 2024-03-07 (×2): qty 1

## 2024-03-07 MED ORDER — GABAPENTIN 100 MG CAPSULE
ORAL_CAPSULE | ORAL | Status: AC
  Filled 2024-03-07: qty 1

## 2024-03-07 MED ORDER — LACTATED RINGERS INTRAVENOUS SOLUTION
INTRAVENOUS | Status: DC
  Administered 2024-03-09: 0 mL via INTRAVENOUS

## 2024-03-07 MED ORDER — MAGNESIUM OXIDE 400 MG (241.3 MG MAGNESIUM) TABLET
400.0000 mg | ORAL_TABLET | Freq: Two times a day (BID) | ORAL | Status: DC
  Administered 2024-03-07 – 2024-03-09 (×4): 400 mg via ORAL
  Filled 2024-03-07 (×3): qty 1

## 2024-03-07 MED ORDER — CARVEDILOL 6.25 MG TABLET
ORAL_TABLET | ORAL | Status: AC
  Filled 2024-03-07: qty 2

## 2024-03-07 MED ORDER — EZETIMIBE 10 MG TABLET
ORAL_TABLET | ORAL | Status: AC
  Filled 2024-03-07: qty 1

## 2024-03-07 MED ORDER — EZETIMIBE 10 MG TABLET
10.0000 mg | ORAL_TABLET | Freq: Every evening | ORAL | Status: DC
  Administered 2024-03-07 – 2024-03-08 (×2): 10 mg via ORAL
  Filled 2024-03-07: qty 1

## 2024-03-07 MED ORDER — TRAMADOL 50 MG TABLET
50.0000 mg | ORAL_TABLET | ORAL | Status: DC | PRN
  Administered 2024-03-08: 50 mg via ORAL
  Filled 2024-03-07: qty 1

## 2024-03-07 MED ORDER — MAGNESIUM SULFATE 1 GRAM/100 ML IN DEXTROSE 5 % INTRAVENOUS PIGGYBACK
INJECTION | INTRAVENOUS | Status: AC
  Filled 2024-03-07: qty 100

## 2024-03-07 MED ORDER — MAGNESIUM OXIDE 400 MG (241.3 MG MAGNESIUM) TABLET
ORAL_TABLET | ORAL | Status: AC
  Filled 2024-03-07: qty 1

## 2024-03-07 NOTE — ED Triage Notes (Signed)
 Rectal bleeding thinks Hemorrhoids  bright red , on blood thinners bleeding thur clothing

## 2024-03-07 NOTE — ED Nurses Note (Signed)
 Report given to [facility], 4E.  Pt to 4E with family.  VSS

## 2024-03-07 NOTE — Nurses Notes (Signed)
 Pt arrived by stretcher to 4E with ER staff at this time.

## 2024-03-07 NOTE — H&P (Signed)
 [place] [facility]    Linville Decarolis A. Claudene, D.O. H&P    Shaunie Boehm 83 y.o. female ED07/ED07   Date of Service: 03/07/2024    Date of Admission:  03/07/2024   PCP: Manus Endo, MD Code Status:Prior       HPI:     History of Present Illness  Kelsey Preston is an 83 year old female with a history of breast cancer and blood clots who presents with significant rectal bleeding.    She has been experiencing intermittent rectal bleeding, particularly when constipated. Initially, the bleeding was minimal, described as bright red blood on wiping. However, today she experienced a significant increase in bleeding, which was constant and accompanied by clots, severe enough to soak through and drip onto the floor.    No nausea, vomiting, abdominal pain, or chest pain. She has been experiencing dizziness over the past two days.    She is currently on Xarelto  for a previous blood clot.    Her past medical history includes breast cancer, diagnosed through blood work and a biopsy of a spot under her armpit, despite imaging not identifying a source in the breast. She underwent chemotherapy a year ago and has not had any treatment this year. She experiences nausea in the late evening, which she attributes to her cancer history.    Her blood pressure was previously 150/60.       ED medications:   Medications Ordered/Administered in the ED   polyethylene glycol electrolyte (goLYTELY ) oral liquid (has no administration in time range)   magnesium  sulfate 1 G in D5W 100 mL premix IVPB (1 g Intravenous New Bag/New Syringe 03/07/24 1630)   iohexol  (OMNIPAQUE  350) solution (100 mL Intravenous Given 03/07/24 1730)         PMHx:    Past Medical History:   Diagnosis Date    Diabetes mellitus, type 2     Embolism (CMS HCC)     left leg    Endometrial cancer     HTN (hypertension)     Hx of breast cancer     Lung cancer     Macular degeneration (senile) of retina         PSHx:   Past Surgical History:   Procedure Laterality  Date    HIP SURGERY Left     HX CHOLECYSTECTOMY      HX COLONOSCOPY      HX HYSTERECTOMY      HX LOBECTOMY Left     LUNG CANCER SURGERY      PORTACATH PLACEMENT            Allergies:    Allergies[1] Social History  Social History[2]    Family History  Family Medical History:       Problem Relation (Age of Onset)    Cancer Brother               Home Meds:      Prior to Admission medications   Medication Sig Start Date End Date Taking? Authorizing Provider   atorvastatin  (LIPITOR) 40 mg Oral Tablet Take 1 Tablet (40 mg total) by mouth Every evening for 30 days 06/01/22 03/05/24  Trudy Daisy, MD   carvediloL  (COREG ) 12.5 mg Oral Tablet Take 1 Tablet (12.5 mg total) by mouth Twice daily 04/01/23   Provider, Historical   cholecalciferol , vitamin D3, 1,250 mcg (50,000 unit) Oral Capsule Take 1 Capsule (50,000 Units total) by mouth Every 7 days 07/18/23   Sherrilee Sora, APRN, CNP  ergocalciferol, vitamin D2, (DRISDOL) 1,250 mcg (50,000 unit) Oral Capsule Take 1 Capsule (50,000 Units total) by mouth Every 7 days 07/18/23   Provider, Historical   esomeprazole magnesium  (NEXIUM) 40 mg Oral Capsule, Delayed Release(E.C.) Take 1 Capsule (40 mg total) by mouth Every morning before breakfast    Provider, Historical   ezetimibe  (ZETIA ) 10 mg Oral Tablet Take 1 Tablet (10 mg total) by mouth Every evening    Provider, Historical   famotidine  (PEPCID ) 40 mg Oral Tablet Take 1 Tablet (40 mg total) by mouth Twice daily 05/16/23   Sherrilee Sora, APRN, CNP   gabapentin  (NEURONTIN ) 100 mg Oral Capsule Take 1 Capsule (100 mg total) by mouth Every night 08/13/23   Moses Agent, MD   lidocaine -prilocaine  (EMLA ) 2.5-2.5 % Cream One time 02/11/23   Provider, Historical   magnesium  oxide (MAG-OX) 400 mg Oral Tablet Take 1 Tablet (400 mg total) by mouth Twice daily  Patient taking differently: Take 1 Tablet (400 mg total) by mouth Daily    Provider, Historical   metFORMIN (GLUCOPHAGE XR) 500 mg Oral Tablet Sustained Release 24 hr Take 1  Tablet (500 mg total) by mouth Twice daily 08/28/22   Provider, Historical   multivitamin with iron Oral Tablet Take 1 Tablet by mouth Daily    Provider, Historical   ondansetron  (ZOFRAN ) 4 mg Oral Tablet Take 1 Tablet (4 mg total) by mouth Every 6 hours as needed for Nausea/Vomiting Indications: prevent nausea and vomiting from cancer chemotherapy 10/17/23   Sherrilee Sora, APRN, CNP   sertraline  (ZOLOFT ) 50 mg Oral Tablet Take 1 Tablet (50 mg total) by mouth Daily    Provider, Historical   traMADoL  (ULTRAM ) 50 mg Oral Tablet Take 1 Tablet (50 mg total) by mouth Every 4 hours as needed for Pain 09/26/22   Duremdes, Gene B, MD   vit C/E/Zn/coppr/lutein terance (PRESERVISION AREDS-2 ORAL) Take 1 Tablet by mouth Twice daily    Provider, Historical   XARELTO  20 mg Oral Tablet TAKE 1 TABLET BY MOUTH EVERY EVENING WITH DINNER 01/20/23   Arlyss Slough, APRN, CNP          ROS:   General: No fever or chills. No weight changes, fatigue, weakness.   HEENT: No headaches, dizziness, changes in vision, changes in hearing, or difficulty swallowing.    Skin:  No rashes, erythema or bruises.   Cardiac: No chest pain, palpitations, or arrhythmia.    Respiratory: No orthopnea, cough, or wheezing.  GI: No nausea or vomiting. No abdominal pain. No diarrhea or constipation. No heatochezia or melena.  Urinary: No dysuria, hematuria, or change in frequency.    Vascular: No edema.     Musculoskeletal: No muscle weakness, pain, or decreased range of motion.   Neurologic: No loss of sensation, numbness or tingling.   Endocrine: No heat or cold intolerance or polydipsia.   Psychiatric: No insomnia, depression or anxiety.      Results for orders placed or performed during the hospital encounter of 03/07/24 (from the past 24 hours)   BASIC METABOLIC PANEL   Result Value Ref Range    SODIUM 140 136 - 145 mmol/L    POTASSIUM 4.7 3.5 - 5.1 mmol/L    CHLORIDE 104 98 - 107 mmol/L    CO2 TOTAL 33 (H) 21 - 31 mmol/L    ANION GAP 3 (L) 4 - 13 mmol/L     CALCIUM 10.4 (H) 8.6 - 10.3 mg/dL    GLUCOSE 790 (H) 74 - 109 mg/dL  BUN 16 7 - 25 mg/dL    CREATININE 8.89 9.39 - 1.30 mg/dL    BUN/CREA RATIO 15 6 - 22    ESTIMATED GFR 50 (L) >59 mL/min/1.49m2    OSMOLALITY, CALCULATED 287 270 - 290 mOsm/kg   CBC WITH DIFF   Result Value Ref Range    WBC 3.6 (L) 3.8 - 11.8 x103/uL    RBC 3.87 3.63 - 4.92 x106/uL    HGB 10.9 10.9 - 14.3 g/dL    HCT 67.1 68.7 - 58.0 %    MCV 84.9 75.5 - 95.3 fL    MCH 28.3 24.7 - 32.8 pg    MCHC 33.3 32.3 - 35.6 g/dL    RDW 84.9 87.6 - 82.2 %    PLATELETS 126 (L) 140 - 440 x103/uL    MPV 9.1 7.9 - 10.8 fL    NEUTROPHIL % 46 43 - 77 %    LYMPHOCYTE % 42 16 - 46 %    MONOCYTE % 9 4 - 11 %    EOSINOPHIL % 2 1 - 7 %    BASOPHIL % 1 0 - 1 %    NEUTROPHIL # 1.60 (L) 1.90 - 8.20 x103/uL    LYMPHOCYTE # 1.50 1.10 - 3.10 x103/uL    MONOCYTE # 0.30 0.20 - 0.90 x103/uL    EOSINOPHIL # 0.10 0.00 - 0.50 x103/uL    BASOPHIL # 0.00 0.00 - 0.10 x103/uL   MAGNESIUM    Result Value Ref Range    MAGNESIUM  1.5 (L) 1.9 - 2.7 mg/dL        Results  Labs  Hgb (03/07/2024): Stable  Hct (03/07/2024): Stable         Physical:  Filed Vitals:    03/07/24 1800 03/07/24 1815 03/07/24 1830 03/07/24 1845   BP: 136/85 (!) 156/69 (!) 156/76 (!) 170/107   Pulse: 64 65 64 66   Resp: 18 14 15 15    Temp:       SpO2: 98% 99% 98% 97%      General: Patient is alert and oriented to person, place, and time. No acute distress. Communicates appropriately.   Head: Normocephalic and atraumatic.    Eyes: Pupils equally round and react to light and accommodate. Extraocular movements intact.    Nose: Nasal passages clear. Mucosa moist.    Throat: Moist oral mucosa. No erythema or exudate of the pharynx. Clear oropharynx.    Neck: Supple. No cervical lymphadenopathy or supraclavicular nodes detected. Trachea midline   Heart: Regular rate and rhythm. S1 & S2 present. No S3 or S4.  Radial and dorsalis pedis pulses +2/4 bilaterally.  Brisk capillary refill.    Lungs: Clear to auscultation.  With no wheezes, crackles or rhonchi noted. No conversational dyspnea. No respiratory distress noted.   Abdomen: Soft, nontender, nondistended belly. Bowel sounds are present in all four quadrants. No rigidity.  No guarding.  No ascites.   Extremities: No edema, cyanosis, or clubbing. Grossly moves all extremities.    Skin: Warm and dry.   Neurologic: Cranial nerves II through XII are grossly intact. Sensation to light touch is intact. Strength 5/5 in upper extremities and lower extremities bilaterally.    Genitourinary:  No urinary incontinence.   Psychiatric: Judgment and insight are intact. Mood and affect are appropriate for the situation.       Diagnostic studies:  No results found.       EKG interpretation: No acute changes from prior with no acute ST or T wave  changes    Assessment & Plan  Rectal bleeding         Assessments:    Active Hospital Problems   (*Primary Problem)    Diagnosis    *Rectal bleeding       Assessment & Plan  Acute gastrointestinal bleeding  Significant bright red blood with clots, likely lower gastrointestinal tract. Blood counts and blood pressure are stable, indicating no acute hemodynamic instability. Differential includes potential bleeding from the stomach, though imaging was inconclusive. Xarelto  may contribute to bleeding risk.  - Held Xarelto .  - Consulted gastroenterologist for EGD and flexible sigmoidoscopy scheduled for morning.  - Monitor for worsening bleeding; if occurs, will prepare for urgent endoscopy.  - Maintain NPO status except for ice chips in case of urgent endoscopy.  - Monitor vital signs and hemoglobin/hematocrit levels.    Chronic anticoagulation therapy  On Xarelto  for previous blood clots. Current bleeding episode may be related to anticoagulation therapy. Xarelto  has been held to mitigate bleeding risk.  - Held Xarelto .             Code status: Prior  DVT/PE Prophylaxis: A-Pumps  Daily Orders (From admission, onward)      None           Anticoagulants (last  24 hours)       None           Diet: DIET CLEAR LIQUID Do you want to initiate MNT Protocol? Yes  DIET NPO - SPECIFIC DATE & TIME EXCEPT ALL MEDS WITH SIPS OF WATER , EXCEPT ICE CHIPS    Disposition:  The patient is currently acutely ill requiring treatment on the medical floor for rectal bleeding.   Patient will be closely evaluated monitor and treatments/medcations will be adjusted accordingly.    Estimated length of stay greater than 48 hr to obtain full medical treatment.      Krystal DELENA Sharps, DO    03/07/2024  [place] [facility]       [1]   Allergies  Allergen Reactions    Adhesive Rash    Ceclor [Cefaclor]  Other Adverse Reaction (Add comment)     Pt states a Ceclor pill gave her blisters in her mouth.   [2]   Social History  Tobacco Use    Smoking status: Never    Smokeless tobacco: Never   Vaping Use    Vaping status: Never Used   Substance Use Topics    Alcohol  use: Never    Drug use: Never

## 2024-03-07 NOTE — Care Plan (Signed)
 Problem: Adult Inpatient Plan of Care  Goal: Absence of Hospital-Acquired Illness or Injury  Outcome: Ongoing (see interventions/notes)  Goal: Optimal Comfort and Wellbeing  Outcome: Ongoing (see interventions/notes)  Goal: Rounds/Family Conference  Outcome: Ongoing (see interventions/notes)     Problem: Fall Injury Risk  Goal: Absence of Fall and Fall-Related Injury  Outcome: Ongoing (see interventions/notes)     Problem: Electrolyte Imbalance  Goal: Electrolyte Balance  Outcome: Ongoing (see interventions/notes)     Problem: Skin Injury Risk Increased  Goal: Skin Health and Integrity  Outcome: Ongoing (see interventions/notes)     Problem: Nausea and Vomiting  Goal: Nausea and Vomiting Relief  Outcome: Ongoing (see interventions/notes)     Problem: Gastrointestinal Bleeding  Goal: Optimal Coping with Acute Illness  Outcome: Ongoing (see interventions/notes)  Goal: Hemostasis  Outcome: Ongoing (see interventions/notes)

## 2024-03-07 NOTE — ED Provider Notes (Signed)
 [facility] - Emergency Department  ED Physician Note      Arrival: Car      Chief Complaint       Chief Complaint   Patient presents with    Rectal Bleeding       HPI:   This is a 83 year old female anti coagulated with Xarelto  20 mg for previous PE presenting to the emergency department with rectal bleeding that began today.  Patient states rectal bleeding was brought on by a bowel movement, however was heavy enough to bleed through her brief and Jeans.  Patient states there were some dark red blood clots within her brief.  Patient denies any abdominal pain or rectal pain.  Patient states that she has had light rectal bleeding in the past 1 other time however it was not this significant.  Patient denies any fevers.  No history of diverticulitis.  Patient states her last colonoscopy was about 9 years ago with Dr. Charlaine.  Patient has a history of breast cancer status post chemotherapy, without any distal mets.  Patient states that they found cancer cells in her blood however were never able to find the breast cancer primary so she did not require surgery or radiation.       Rectal Bleeding      Brette Cast is a 83 y.o. female who presents to the ED today for: had concerns including Rectal Bleeding.        ROS:     As per the HPI. (See HPI)    PMH/PSH/FH/SH:       Past Medical History:   Diagnosis Date    Diabetes mellitus, type 2     Embolism (CMS HCC)     left leg    Endometrial cancer     HTN (hypertension)     Hx of breast cancer     Lung cancer     Macular degeneration (senile) of retina      Problem List[1]     Past Surgical History:   Procedure Laterality Date    Hip surgery Left     Hx cholecystectomy      Hx colonoscopy      Hx hysterectomy      Hx lobectomy Left     Lung cancer surgery      Portacath placement         Family History   Problem Relation Age of Onset    Cancer Brother       reports that she has never smoked. She has never used smokeless tobacco. She  reports that she is not currently sexually active. She reports that she does not drink alcohol  and does not use drugs.    Meds/Allergies:       Current Outpatient Medications   Medication Instructions    atorvastatin  (LIPITOR) 40 mg, Oral, EVERY EVENING    carvedilol  (COREG ) 12.5 mg, 2 TIMES DAILY    cholecalciferol  (vitamin D3) 50,000 Units, Oral, EVERY 7 DAYS    ergocalciferol (vitamin D2) (DRISDOL) 50,000 Units, EVERY 7 DAYS    esomeprazole magnesium  (NEXIUM) 40 mg, EVERY MORNING BEFORE BREAKFAST    ezetimibe  (ZETIA ) 10 mg, EVERY EVENING    famotidine  (PEPCID ) 40 mg, Oral, 2 TIMES DAILY    gabapentin  (NEURONTIN ) 100 mg, Oral, NIGHTLY    lidocaine -prilocaine  (EMLA ) 2.5-2.5 % Cream ONCE    magnesium  oxide (MAG-OX) 400 mg, 2 TIMES DAILY    metFORMIN (GLUCOPHAGE XR) 500 mg, 2 TIMES DAILY    multivitamin with  iron Oral Tablet 1 Tablet, Daily    ondansetron  (ZOFRAN ) 4 mg, Oral, EVERY 6 HOURS PRN    sertraline  (ZOLOFT ) 50 mg, Daily    traMADoL  (ULTRAM ) 50 mg, Oral, EVERY 4 HOURS PRN    vit C/E/Zn/coppr/lutein /zeaxan (PRESERVISION AREDS-2 ORAL) 1 Tablet, 2 TIMES DAILY    Xarelto  20 mg, Oral, EVERY EVENING AFTER DINNER      Allergies[2]    Physical Exam:       ED Triage Vitals [03/07/24 1440]   BP (Non-Invasive) (!) 147/87   Heart Rate 63   Respiratory Rate 20   Temperature 36.2 C (97.1 F)   SpO2 98 %   Weight 86.6 kg (191 lb)   Height 1.646 m (5' 4.8)     Physical Exam  HENT:      Head: Normocephalic.      Mouth/Throat:      Pharynx: Oropharynx is clear.   Eyes:      Conjunctiva/sclera: Conjunctivae normal.   Cardiovascular:      Rate and Rhythm: Normal rate and regular rhythm.      Heart sounds: Normal heart sounds.   Pulmonary:      Effort: Pulmonary effort is normal.      Breath sounds: Normal breath sounds.   Abdominal:      General: Bowel sounds are normal.      Palpations: Abdomen is soft.      Tenderness: There is no abdominal tenderness.      Comments: Rectal exam is significant for bright red  blood per rectum.  There are a few small noninflamed external hemorrhoids that are not bleeding.  Rectal wall without any appreciated masses or tenderness.  No thrombosed internal hemorrhoids appreciated on digital rectal exam.  Rectal tone was normal.     Skin:     General: Skin is warm and dry.   Neurological:      General: No focal deficit present.      Mental Status: She is alert and oriented to person, place, and time.          Results:       Labs Ordered/Reviewed   BASIC METABOLIC PANEL - Abnormal; Notable for the following components:       Result Value    CO2 TOTAL 33 (*)     ANION GAP 3 (*)     CALCIUM 10.4 (*)     GLUCOSE 209 (*)     ESTIMATED GFR 50 (*)     All other components within normal limits    Narrative:     Estimated Glomerular Filtration Rate (eGFR) is calculated using the CKD-EPI (2021) equation, intended for patients 43 years of age and older. If gender is not documented or unknown, there will be no eGFR calculation.     CBC WITH DIFF - Abnormal; Notable for the following components:    WBC 3.6 (*)     PLATELETS 126 (*)     NEUTROPHIL # 1.60 (*)     All other components within normal limits   MAGNESIUM  - Abnormal; Notable for the following components:    MAGNESIUM  1.5 (*)     All other components within normal limits   CBC/DIFF    Narrative:     The following orders were created for panel order CBC/DIFF.  Procedure                               Abnormality  Status                     ---------                               -----------         ------                     CBC WITH DIFF[[id number]]                Abnormal            Final result                 Please view results for these tests on the individual orders.   EXTRA TUBES    Narrative:     The following orders were created for panel order EXTRA TUBES.  Procedure                               Abnormality         Status                     ---------                               -----------         ------                      BLUE TOP ULAZ[[id number]]                                    Final result               GRAY TOP ULAZ[[id number]]                                    Final result                 Please view results for these tests on the individual orders.   BLUE TOP TUBE   GRAY TOP TUBE       CT ANGIO CHEST ABDOMEN PELVIS W IV CONTRAST   Final Result by [id number] Edi, Radiant Critical (12/21 1758)   Abnormal   CURVILINEAR HYPERDENSITY IN THE DEPENDENT PORTION OF THE STOMACH POSSIBLY DUE TO MATERIAL HOWEVER CONTRAST EXTRAVASATION DUE TO ACTIVE GI BLEED CAN NOT BE EXCLUDED. PLEASE NOTE, EVALUATION IS LIMITED WITHOUT PRECONTRAST IMAGING.      DIFFUSE ESOPHAGEAL WALL THICKENING. CORRELATE FOR ESOPHAGITIS. CONSIDER UPPER ENDOSCOPY.      MILD RIGHT RENAL PELVIECTASIS AND PROXIMAL RIGHT HYDROURETER WITH TAPERING TO A REGION OF SOFT TISSUE NODULARITY AND SMALL CALCIFICATIONS WHICH MAY BE SECONDARY TO ADJACENT OVARIAN VEIN THROMBOSIS AND PHLEBOLITHS HOWEVER UROTHELIAL NEOPLASM COULD ALSO HAVE THIS APPEARANCE. RECOMMEND FURTHER EVALUATION WITH NONEMERGENT CT UROGRAM.      CHRONIC, SEVERE L1 INFERIOR ENDPLATE COMPRESSION FRACTURE WITH INTERVAL LOSS OF HEIGHT WHEN COMPARED TO NOVEMBER 2024.      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      ADDITIONAL CHRONIC AND INCIDENTAL FINDINGS AS ABOVE.  These important findings were relayed via secure EPIC chat to the ordering provider Vanice Pinal, PA  on 03/07/2024 at 5:54 PM.      One or more dose reduction techniques were used (e.g., Automated exposure control, adjustment of the mA and/or kV according to patient size, use of iterative reconstruction technique).      A Critical Red actionable finding has been sent via the [employer] Actionable Findings application on 03/07/2024 5:54 PM, Message ID [id number]. Receipt of this communication will be communicated to [facility] or responsible provider and will be documented in [employer] Actionable  Findings System upon receiving the acknowledgement.         Radiologist location ID: [id number]             ED Course/MDM:     Meds Given:  Medications Ordered/Administered in the ED   magnesium  sulfate 1 G in D5W 100 mL premix IVPB (0 g Intravenous Stopped 03/07/24 1629)   polyethylene glycol electrolyte (goLYTELY ) oral liquid (has no administration in time range)   iohexol  (OMNIPAQUE  350) solution (100 mL Intravenous Given 03/07/24 1730)       Medical Decision Making  Patient is having lower GI bleeding bright red blood per rectum.  Patient is on Xarelto  20 mg for previous VTE.  Patient also had recent chemotherapy for a breast cancer primary that was never detected.  Patient denies any abdominal pain, and does not have any history of diverticulitis.  A CT a of the abdomen and pelvis was performed and showed a possible GI bleed in the stomach.  I discussed CT findings as well as patient's history and presentation with Dr. Tobie who recommended admission for EGD and flex sig tomorrow morning.  I have sent a secure message to the hospitalist team for admission.  At this time patient's hemoglobin is stable, and her vital signs are satisfactory, however given her age her presentation and her anticoagulation feel that admission would be the safest route at this point.      Hospitalist Dr. Claudene has agreed to assess patient for admission.     Amount and/or Complexity of Data Reviewed  Labs: ordered.  Radiology: ordered. Decision-making details documented in ED Course.  ECG/medicine tests: independent interpretation performed.    Risk  Prescription drug management.  Decision regarding hospitalization.      ED Course as of 03/07/24 1842   Sun Mar 07, 2024   1540 HGB: 10.9  Stable and within normal limits   1540 HCT: 32.8  Stable and within normal limits   1829 CT ANGIO CHEST ABDOMEN PELVIS W IV CONTRAST(!)  CT report finding concerning for a GI bleed in the stomach however did not show any lower GI bleeding.  On exam  patient had a large amount of bright red blood per rectum.  Discussed with Dr. Tobie who recommends admission for flex sig and endoscopy tomorrow.    1841 MAGNESIUM (!): 1.5  2 g IV Mag ordered         Impressions:       Diagnoses       Diagnosis Comment Added By Time Added    Gastrointestinal hemorrhage, unspecified gastrointestinal hemorrhage type  Pinal Vanice Bolt, PA-C 03/07/2024  6:31 PM    Bright red blood per rectum  Pinal Vanice Bolt, PA-C 03/07/2024  6:31 PM    Current use of long term anticoagulation  Karron Alvizo Jessi, PA-C 03/07/2024  6:31 PM  Disposition(or Handoff):     Admitted    March Joos Quincy Pinal, PA-C  03/07/2024 18:41      Part of this note may have been generated using voice recognition software.  Be advised, it is possible that the generated note may be prone to syntax and other dictation software errors.           [1]  Patient Active Problem List  Diagnosis    Chest pain    History of lung cancer    Essential hypertension    GERD (gastroesophageal reflux disease)    DM2 (diabetes mellitus, type 2)    Acute deep vein thrombosis (DVT) of femoral vein of left lower extremity (CMS HCC)    DVT (deep venous thrombosis)    Axillary mass, left    Breast cancer    Sepsis    Abdominal pain    Hypomagnesemia    Acute hip pain, left    Fall at home, sequela    Urinary tract infection    Acute left-sided low back pain without sciatica    Abnormal urinalysis    Gastritis, presence of bleeding unspecified, unspecified chronicity, unspecified gastritis type    Traumatic compression fracture of lumbar vertebra with routine healing    Osteoporosis   [2]  Allergies  Allergen Reactions    Adhesive Rash    Ceclor [Cefaclor]  Other Adverse Reaction (Add comment)     Pt states a Ceclor pill gave her blisters in her mouth.

## 2024-03-07 NOTE — Progress Notes (Signed)
 [facility]  Gastroenterology/ Hepatology Brief Progress Note      Kelsey Preston  Date of service: 03/07/2024    Chart reviewed. Patient discussed with Vanice Pinal, PA in ED department. Will schedule for EGD and Flexible Sigmoidoscopy tomorrow.     Reginald Blanch, MD

## 2024-03-08 ENCOUNTER — Encounter (HOSPITAL_COMMUNITY)
Admission: EM | Disposition: A | Discharge status: Home / Self Care | Payer: Self-pay | Source: Intra-hospital | Attending: HOSPITALIST

## 2024-03-08 ENCOUNTER — Inpatient Hospital Stay (HOSPITAL_COMMUNITY): Admitting: Certified Registered"

## 2024-03-08 ENCOUNTER — Inpatient Hospital Stay (HOSPITAL_COMMUNITY)

## 2024-03-08 ENCOUNTER — Encounter (HOSPITAL_COMMUNITY): Payer: Self-pay | Admitting: Internal Medicine

## 2024-03-08 DIAGNOSIS — Z85118 Personal history of other malignant neoplasm of bronchus and lung: Secondary | ICD-10-CM

## 2024-03-08 DIAGNOSIS — D709 Neutropenia, unspecified: Secondary | ICD-10-CM

## 2024-03-08 DIAGNOSIS — Z8542 Personal history of malignant neoplasm of other parts of uterus: Secondary | ICD-10-CM

## 2024-03-08 DIAGNOSIS — I701 Atherosclerosis of renal artery: Secondary | ICD-10-CM

## 2024-03-08 DIAGNOSIS — D696 Thrombocytopenia, unspecified: Secondary | ICD-10-CM

## 2024-03-08 DIAGNOSIS — R9341 Abnormal radiologic findings on diagnostic imaging of renal pelvis, ureter, or bladder: Secondary | ICD-10-CM

## 2024-03-08 DIAGNOSIS — Z853 Personal history of malignant neoplasm of breast: Secondary | ICD-10-CM

## 2024-03-08 DIAGNOSIS — M7989 Other specified soft tissue disorders: Secondary | ICD-10-CM

## 2024-03-08 DIAGNOSIS — E785 Hyperlipidemia, unspecified: Secondary | ICD-10-CM

## 2024-03-08 DIAGNOSIS — R32 Unspecified urinary incontinence: Secondary | ICD-10-CM

## 2024-03-08 LAB — COMPREHENSIVE METABOLIC PANEL, NON-FASTING
ALBUMIN/GLOBULIN RATIO: 1.3 (ref 0.8–1.4)
ALBUMIN: 3.6 g/dL (ref 3.5–5.7)
ALKALINE PHOSPHATASE: 89 U/L (ref 34–104)
ALT (SGPT): 16 U/L (ref 7–52)
ANION GAP: 5 mmol/L (ref 4–13)
AST (SGOT): 27 U/L (ref 13–39)
BILIRUBIN TOTAL: 0.8 mg/dL (ref 0.3–1.0)
BUN/CREA RATIO: 14 (ref 6–22)
BUN: 13 mg/dL (ref 7–25)
CALCIUM, CORRECTED: 10.6 mg/dL (ref 8.9–10.8)
CALCIUM: 10.3 mg/dL (ref 8.6–10.3)
CHLORIDE: 103 mmol/L (ref 98–107)
CO2 TOTAL: 30 mmol/L (ref 21–31)
CREATININE: 0.93 mg/dL (ref 0.60–1.30)
ESTIMATED GFR: 62 mL/min/1.73mˆ2 (ref >59)
GLOBULIN: 2.7 (ref 2.0–3.5)
GLUCOSE: 117 mg/dL — ABNORMAL HIGH (ref 74–109)
OSMOLALITY, CALCULATED: 277 mosm/kg (ref 270–290)
POTASSIUM: 4.1 mmol/L (ref 3.5–5.1)
PROTEIN TOTAL: 6.3 g/dL — ABNORMAL LOW (ref 6.4–8.9)
SODIUM: 138 mmol/L (ref 136–145)

## 2024-03-08 LAB — CBC
HCT: 34.3 % (ref 31.2–41.9)
HGB: 11.6 g/dL (ref 10.9–14.3)
MCH: 28.4 pg (ref 24.7–32.8)
MCHC: 33.8 g/dL (ref 32.3–35.6)
MCV: 84.1 fL (ref 75.5–95.3)
MPV: 9.4 fL (ref 7.9–10.8)
PLATELETS: 109 x10ˆ3/uL — ABNORMAL LOW (ref 140–440)
RBC: 4.08 x10ˆ6/uL (ref 3.63–4.92)
RDW: 14.8 % (ref 12.3–17.7)
WBC: 4 x10ˆ3/uL (ref 3.8–11.8)

## 2024-03-08 LAB — MAGNESIUM: MAGNESIUM: 1.7 mg/dL — ABNORMAL LOW (ref 1.9–2.7)

## 2024-03-08 LAB — POC BLOOD GLUCOSE (RESULTS)
GLUCOSE, POC: 131 mg/dL — ABNORMAL HIGH (ref 70–100)
GLUCOSE, POC: 154 mg/dL — ABNORMAL HIGH (ref 70–100)
GLUCOSE, POC: 111 mg/dL — ABNORMAL HIGH (ref 70–100)
GLUCOSE, POC: 167 mg/dL — ABNORMAL HIGH (ref 70–100)

## 2024-03-08 LAB — VITAMIN B12: VITAMIN B 12: 583 pg/mL (ref 180–914)

## 2024-03-08 LAB — FOLATE: FOLATE: 43.6 ng/mL — ABNORMAL HIGH (ref 5.9–24.8)

## 2024-03-08 SURGERY — SIGMOIDOSCOPY FLEXIBLE
Anesthesia: Monitor Anesthesia Care

## 2024-03-08 MED ORDER — MAGNESIUM SULFATE 1 GRAM/100 ML IN DEXTROSE 5 % INTRAVENOUS PIGGYBACK
1.0000 g | INJECTION | Freq: Once | INTRAVENOUS | Status: AC
  Administered 2024-03-08: 0 g via INTRAVENOUS
  Administered 2024-03-08: 1 g via INTRAVENOUS
  Filled 2024-03-08: qty 100

## 2024-03-08 MED ORDER — PROPOFOL 10 MG/ML IV BOLUS
INJECTION | Freq: Once | INTRAVENOUS | Status: DC | PRN
  Administered 2024-03-08: 20 mg via INTRAVENOUS
  Administered 2024-03-08: 50 mg via INTRAVENOUS
  Administered 2024-03-08 (×3): 20 mg via INTRAVENOUS
  Administered 2024-03-08: 10 mg via INTRAVENOUS
  Administered 2024-03-08 (×2): 20 mg via INTRAVENOUS
  Administered 2024-03-08: 10 mg via INTRAVENOUS

## 2024-03-08 MED ORDER — INSULIN LISPRO 100 UNIT/ML SUB-Q SSIP VIAL
1.0000 [IU] | INJECTION | Freq: Four times a day (QID) | SUBCUTANEOUS | Status: DC
  Administered 2024-03-08 (×2): 0 [IU] via SUBCUTANEOUS
  Administered 2024-03-08: 1 [IU] via SUBCUTANEOUS
  Administered 2024-03-08 – 2024-03-09 (×3): 0 [IU] via SUBCUTANEOUS
  Filled 2024-03-08 (×2): qty 1

## 2024-03-08 MED ORDER — DEXTROSE 40 % ORAL GEL: 15.0000 g | ORAL | Status: DC | PRN

## 2024-03-08 MED ORDER — DEXTROSE 50 % IN WATER (D50W) INTRAVENOUS SYRINGE: 12.5000 g | INJECTION | INTRAVENOUS | Status: DC | PRN

## 2024-03-08 MED ORDER — LIDOCAINE (PF) 100 MG/5 ML (2 %) INTRAVENOUS SYRINGE
INJECTION | Freq: Once | INTRAVENOUS | Status: DC | PRN
  Administered 2024-03-08: 60 mg via INTRAVENOUS

## 2024-03-08 MED ORDER — GLUCAGON HCL 1 MG/ML SOLUTION FOR INJECTION: 1.0000 mg | Freq: Once | INTRAMUSCULAR | Status: DC | PRN

## 2024-03-08 MED ORDER — RIVAROXABAN 10 MG TABLET: 20.0000 mg | ORAL_TABLET | Freq: Every evening | ORAL | Status: DC

## 2024-03-08 NOTE — OR Surgeon (Signed)
 [facility]    Colonoscopy          Patient Name: Kelsey Preston  Age:  83 y.o.  Sex:  female  MRN:  [id number]  CSN:  [id number]  Date of Birth: [birth year]    Date: 03/08/2024     Procedure:    Colonoscopy                                                ASA:    per Anesthesia     Medications Given:   Per Nurse Anesthetist     Equipment Used:     Olympus [id number]    Indications:     GI bleeding     A digital rectal exam was performed with a 360 degree sweep. The prep was good. Visualization was limited in some areas by liquid stool. An irrigation pump with mylicon was used to wash the mucosa. Liquid stool was suctioned as much as possible to maximize visualization. Scope was passed to cecum without difficulty. The ileocecal valve and appendiceal orifice were identified. During withdrawal, careful attention was paid to the proximal sides of folds, flexures, bends, and the rectal valves. Each section of the colon was examined twice.      Sessile polyp was seen at the following location(s):          0.5 cm at 45 cm x 1,   This was biopsied and coagulated with a hot biopsy forceps. There was good hemostasis.     A pedunculated polyp was seen at the following location:          3.0 cm in rectum at 5 cm x 1,   The polyp was tented off of the submucosa and was then resected by snare polypectomy in 2 stages with good hemostasis.The entire polyp was removed. The visualized mucosa appeared normal, except for colon polyps.     Diverticula were not seen. On withdrawal and on retroflexion view small internal hemorrhoids were seen. Air was withdrawn from the colon prior to the end of the procedure. Patient tolerated procedure well. Withdrawal time: > 6 minutes; Photograph(s) Taken: Yes     Impressions:  2 polyps removed (one large in rectum) as described above, with normal visualized mucosa elsewhere.  Small internal hemorrhoids, grade 1     Recommendations:  Await results of biopsies  High fiber diet  Repeat  colonoscopy in 5 years due to personal history of colon polyps  Hold Xarelto  for 5 days, then resume     Reginald Blanch, MD

## 2024-03-08 NOTE — Anesthesia Preprocedure Evaluation (Addendum)
 ANESTHESIA PRE-OP EVALUATION  Planned Procedure: EGD  SIGMOIDOSCOPY FLEXIBLE  Review of Systems     anesthesia history negative               Pulmonary  negative pulmonary ROS,    Cardiovascular    Hypertension, ECG reviewed, Denies chest pain; last cardiology visit stable and DVT ,       GI/Hepatic/Renal    Rectal bleeding, GERD and kidney stones        Endo/Other    obesity and drug induced coagulopathy,   type 2 diabetes/ stable    Neuro/Psych/MS   negative neuro/psych ROS,      Cancer  CA (left lower lobe lobectomy),  Endometrial CA  breast cancer, lung cancer,                     Physical Assessment      Airway       Mallampati: II    TM distance: <3 FB    Mouth Opening: good.            Dental           (+) upper dentures, missing           Pulmonary    Breath sounds clear to auscultation       Cardiovascular      Rate: Abnormal       Other findings              Plan  ASA 3     Planned anesthesia type: general     total intravenous anesthesia                          Anesthetic plan and risks discussed with patient  signed consent obtained          Patient's NPO status is appropriate for Anesthesia.

## 2024-03-08 NOTE — Addendum Note (Signed)
 Addendum  created 03/08/24 1540 by Jesus Krabbe, CRNA    Clinical Note Signed

## 2024-03-08 NOTE — OR Surgeon (Signed)
 [facility]      EGD NOTE               Patient Name: Kelsey Preston  Age:  83 y.o.  Sex:  female  MRN:  [id number]  CSN:  [id number]  Date of Birth: 1940-05-11    Medications Given: Per Nurse Anesthetist    Equipment Used: Olympus [id number]    Date: 03/08/2024     Indications:  GI bleeding    The scope was passed into the esophagus without difficulty under direct visualization. There was mild erythema at the distal esophagus. No esophageal varices. A small hiatal hernia was seen. The mucosa in the cardia appeared normal. The diaphragmatic impingement on retroflexion view appeared normal. The Z line appeared slightly irregular.      The mucosa in the fundus was normal. The mucosa in the body and antrum had moderate erythema. The mucosa in the duodenal bulb was normal. The mucosa past the duodenal bulb to the 2nd portion of the duodenum appeared normal. The ampulla was not visualized.   Biopsies were not taken. Photograph(s) Taken: Yes; Blood loss: none     Impression:  Mild distal esophagitis, LA grade A  Moderate hiatal hernia  Moderate gastritis   No ulcers or bleeding seen     Recommendations:  Continue Pantoprazole  40 mg po q day, before dinner (Nexium at home)  Continue Famotidine  40 mg po bid  Maintain antireflux precautions      Reginald Blanch, MD

## 2024-03-08 NOTE — Anesthesia Postprocedure Evaluation (Signed)
 Anesthesia Post Op Evaluation    Patient: Kelsey Preston  Procedure(s):  EGD  COLONOSCOPY WITH POLYPECTOMY using hot forceps and hot snare    Last Vitals:Temperature: 36.5 C (97.7 F) (03/08/24 1519)  Heart Rate: 58 (03/08/24 1519)  BP (Non-Invasive): 123/66 (03/08/24 1519)  Respiratory Rate: 16 (03/08/24 1519)  SpO2: 100 % (03/08/24 1519)    No notable events documented.    Patient is sufficiently recovered from the effects of anesthesia to participate in the evaluation and has returned to their pre-procedure level.  Patient location during evaluation: PACU       Patient participation: complete - patient participated  Level of consciousness: awake and alert and responsive to verbal stimuli    Pain score: 0  Pain management: adequate  Airway patency: patent    Anesthetic complications: no  Cardiovascular status: acceptable  Respiratory status: acceptable  Hydration status: acceptable  Patient post-procedure temperature: Pt Normothermic   PONV Status: Absent

## 2024-03-08 NOTE — Consults (Signed)
 Department of Hematology/Oncology   ONCOLOGY CONSULT NOTE      REFERRING PROVIDER:  Hospitalist    Reason for Consult: CT abd pelvis concerning for possible urothelial neoplasm   PCP: Manus Endo    HISTORY OF PRESENT ILLNESS:  Kelsey Preston is a 83 y.o. female who presented to the hospital for evaluation of rectal bleeding.  Hematology/oncology consultation was requested for evaluation of CT abd pelvis concerning for possible urothelial neoplasm  . Patient states yesterday morning she went to the bathroom and watery blood was expelled from rectum a decent amount of it. Did not note stool.. just blood, so she presented to the ER. Since prepping for GI w/u she has also had vomiting. Patient c/o headaches since prepping too. Blurred vision chronically and dizziness w/ prep. Patient states chronically has a problem swallowing liquids feels like they go down wrong hole into lungs, also c/o fever and chills this week.     Patient is known to us  at [facility] The patient last seen in routine f/u 03/05/24 .    Pertinent Oncology history as follows:  Previously followed by retired environmental health practitioner, Dr. Marty Kil.     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  11/25/2007 endocervical curettage: FIGO grade 1 adenocarcinoma.  12/16/2007 TAH+BSO: FIGO grade 1 adenocarcinoma.  Admitted to [facility] in 05/2022 for sudden onset chest discomfort and diaphoresis.  The patient was evaluated by the Cardiology and underwent a myocardial perfusion scan that showed a small moderate-sized reversible basal-mid inferolateral perfusion defect. Cardiology considered cardiac catheterization, however, the patient had an ultrasound which revealed the presence of an acute thrombus embolus in the left femoral vein.    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 Did not require adjuvant therapies.  Molecular  profile:  Positive: EGFR exon 19.  Negative: KRAS, MET, RET, ALK, ROS1.  Evaluated by PCP initially with CXR showing left lower lobe mass.  A subsequent CT showed a 2.1 x 2.1 x 2.2 cm left lower lobe mass with no associated adenopathy.  Pathology from lobectomy showed a 2.4 cm adenocarcinoma with focal clear cell features.  A bilateral screening mammogram on 05/10/2022 showed a 1.9 cm low axillary lymph node on the left.  An ultrasound confirmed this abnormality.  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  07/04/22 PET scan THERE IS A SOLITARY HYPERMETABOLIC LESION IN THE LEFT AXILLA, CORRESPONDING TO THE BIOPSIED LESION. IT MEASURES ABOUT 2.3 X 1.6 CM. MAX SUV IS 16.7. GIVEN OTHER FINDINGS BELOW, THIS MAY REFLECT A BREAST PRIMARY LESION. THIS STUDY DOES NOT SHOW ANY OTHER HYPERMETABOLIC ADENOPATHY. NO OTHER BREAST LESION IS SEEN ON EITHER SIDE. THIS STUDY DOESN'T SHOW ANY CONVINCING EVIDENCE FOR OTHER PRIMARY LESION. THERE IS FAIRLY INTENSE ACTIVITY ASSOCIATED WITH THE BOWEL, PARTICULARLY THE COLON, LONGITUDINAL, WITH NO PARTICULARLY FOCAL LESION. NO SIGNIFICANT PERICOLONIC INFILTRATIVE OR INFLAMMATORY CHANGES ARE SEEN. THIS IS UNLIKELY TO BE OF MALIGNANT ETIOLOGY. NO HYPERMETABOLIC LUNG, LIVER OR OTHER LIKELY METASTATIC LESION IS SEEN. THERE IS A PROBABLE BENIGN ADENOMA IN THE LEFT ADRENAL, WITH A COARSE CALCIFICATION. THIS IS NOT HYPERMETABOLIC. DENSITY IS ABOUT 7.2 HU. THIS MEASURES 2.0 X 1.6 CM. NONOBSTRUCTING RIGHT RENAL CALCULUS. PROBABLE CYSTS IN THE LEFT KIDNEY, SOME OF WHICH ARE SMALL HYPERDENSE CYSTS. NO DEFINITE HYPERMETABOLIC RENAL/URINARY TRACT LESION IS DEMONSTRATED HERE. THE CT IMAGES DON'T SHOW ANY  GROSS BLADDER LESION.  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 For some reason, We could not find an actual size measurement on the pathology report.  Multiple imaging studies of the breast were performed, but no primary lesion was found.  The malignant lesion was definitively classified as a lymph node.  Receptor staining showed that it was ER negative and HER2 Neu positive.  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  09/10/22 Per Dr. Moses progress note:  With the unknown primary location, they discussed the options of either no additional surgery and taking our chances versus radiation of the breast versus mastectomy.  I believe she is still undecided but maybe leaning towards mastectomy.  That is a decision that can be made later because we have 3 months of chemotherapy to perform 1st.  Radiation to the axillary area may be a requirement anyway due to the lymph node that was known to be involved.   09/27/22 Kanjiti, taxol  ( paclitaxel  and trastuzumab  ) adjuvently weekly  10/18/22 decrease the dose of Paclitaxel  due neutropenia w/ C2   12/27/22 Echo completed on 05/31/22 showed an Ejection Fraction of 60-65%. Her follow up Echo on 09/24/22 showed EF of 54% and Echo on 09/24/22 showed EF of 50-55%. Dr. Moses was notified of these results and he recommended holding Kinjinti today and repeating Echo prior to her next dose of Kinjinti.   01/23/23 echO: Ejection Fraction is 68.0 %. make arrangements to resume trastuzumab  adjuvant therapy   01/23/23 PET scan:  L1 VERTEBRAL BODY MILD COMPRESSION FRACTURE DEFORMITY WITH CONCAVITY OF THE INFERIOR ENDPLATE WHICH MAY BE SLIGHTLY MORE PRONOUNCED WHEN COMPARED TO 01/08/2023. SOME METABOLIC ACTIVITY WITHIN THE VERTEBRAL BODY ALONG THE INFERIOR ENDPLATE IS PRESENT AND COULD BE RELATED TO RECENT FRACTURE. PLEASE CONSIDER MRI PREFERABLY WITHOUT AND WITH IV CONTRAST FOR FURTHER AND MORE DEFINITIVE EVALUATION OF THE LUMBAR SPINE PARTICULARLY THE L1 LEVEL AND HELP COMPLETELY EXCLUDE THE  POSSIBILITY OF A PATHOLOGIC FRACTURE.  NO CURRENT SUSPICIOUS FOCAL HYPERMETABOLIC ACTIVITY INVOLVING THE SOFT TISSUES OF THE NECK, CHEST, ABDOMEN OR PELVIS TO SUGGEST MALIGNANCY-METASTASIS WHEN COMPARED TO 07/04/2022. PREVIOUSLY SEEN HYPERMETABOLIC LEFT AXILLARY LESION-LYMPH NODE ON 07/04/2022 IS NO LONGER PRESENT.  PERSISTENT IMAGING FINDINGS SUGGESTING A POSSIBLE DEGREE OF NONSPECIFIC ENTERITIS -COLITIS WHEN COMPARED TO CT FROM January 08, 2023. PLEASE CORRELATE WITH CLINICAL FINDINGS AND PATIENT HISTORY.  02/18/23 signatera negative  04/23/23 Kanjinti  continues  08/29/23 continues Kanjinti   10/17/23 completes 1 year of Kanjinti   11/05/23 outside provider stopped Eliquis  5mg  po bid  11/13/23 echo: Ejection Fraction is 66.0 %.  03/07/24 CTA chest: ct abdomen/pelvis DIFFUSE ESOPHAGEAL WALL THICKENING. CORRELATE FOR ESOPHAGITIS. CONSIDER UPPER ENDOSCOPY. MILD RIGHT RENAL PELVIECTASIS AND PROXIMAL RIGHT HYDROURETER WITH TAPERING TO A REGION OF SOFT TISSUE NODULARITY AND SMALL CALCIFICATIONS WHICH MAY BE SECONDARY TO ADJACENT OVARIAN VEIN THROMBOSIS AND PHLEBOLITHS HOWEVER UROTHELIAL NEOPLASM COULD ALSO HAVE THIS APPEARANCE. 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        ROS:   Consitutional: energy level poor, appetite poor, + weight loss but has gained about 11 lbs back, + fever, + chills  HEENT: No epistaxis, + dysphagia chronically w/ liquids, no gum bleeding, + chronic blurred vision  Cardiovascular: no chest pain, no palpitations, no orthopnea  Respiratory: No  cough, no hemoptysis, no dyspnea, no pleurisy,m no wheezing  Gastrointestinal: + nausea, + vomiting, no hematemesis, +diarrhea, no constipation, + melena bright red rectal bleeding on admission  Musculoskeletal: No swollen joints, no joint redness  Skin: No Rash, No nodules, no pruritus, no lesions  Neurologic: No seizures, + headache,+ dizziness,  no paresthesia  Psychiatric: no  depression, no anxiety  Endocrine: no polyuria, no polydipsia    HISTORY:  Past Medical History:   Diagnosis Date    Diabetes mellitus, type 2     Embolism (CMS HCC)     left leg    Endometrial cancer     HTN (hypertension)     Hx of breast cancer     Lung cancer     Macular degeneration (senile) of retina          Past Surgical History:   Procedure Laterality Date    HIP SURGERY Left     HX CHOLECYSTECTOMY      HX COLONOSCOPY      HX HYSTERECTOMY      HX LOBECTOMY Left     LUNG CANCER SURGERY      PORTACATH PLACEMENT           Social History     Socioeconomic History    Marital status: Married     Spouse name: Not on file    Number of children: Not on file    Years of education: Not on file    Highest education level: Not on file   Occupational History    Not on file   Tobacco Use    Smoking status: Never    Smokeless tobacco: Never   Vaping Use    Vaping status: Never Used   Substance and Sexual Activity    Alcohol  use: Never    Drug use: Never    Sexual activity: Not Currently   Other Topics Concern    Not on file   Social History Narrative    Not on file     Social Determinants of Health     Financial Resource Strain: Medium Risk (01/29/2023)    Received from [facility]    Overall Financial Resource Strain (CARDIA)     Difficulty of Paying Living Expenses: Somewhat hard   Transportation Needs: No Transportation Needs (01/29/2023)    Received from [facility] - Transportation     Lack of Transportation (Medical): No     Lack of Transportation (Non-Medical): No   Social Connections: Low Risk (03/07/2024)    Social Connections     SDOH Social Isolation: 5 or more times a week   Intimate Partner Violence: Not At Risk (01/29/2023)    Received from [facility]    Humiliation, Afraid, Rape, and Kick questionnaire     Within the last year, have you been afraid of your partner or ex-partner?: No     Within the last year, have you been humiliated or emotionally abused in other ways by your partner  or ex-partner?: No     Within the last year, have you been kicked, hit, slapped, or otherwise physically hurt by your partner or ex-partner?: No     Within the last year, have you been raped or forced to have any kind of sexual activity by your partner or ex-partner?: No   Housing Stability: Low Risk  (01/29/2023)    Received from [facility] Stability Vital Sign     In the last  12 months, was there a time when you were not able to pay the mortgage or rent on time?: No     In the past 12 months, how many times have you moved where you were living?: 1     At any time in the past 12 months, were you homeless or living in a shelter (including now)?: No     Family Medical History:       Problem Relation (Age of Onset)    Cancer Brother            Outpatient Medications Marked as Taking for the 03/07/24 encounter [facility] Encounter)   Medication Sig    carvediloL  (COREG ) 12.5 mg Oral Tablet Take 1 Tablet (12.5 mg total) by mouth Twice daily    esomeprazole magnesium  (NEXIUM) 40 mg Oral Capsule, Delayed Release(E.C.) Take 1 Capsule (40 mg total) by mouth Every morning before breakfast    ezetimibe  (ZETIA ) 10 mg Oral Tablet Take 1 Tablet (10 mg total) by mouth Every evening    famotidine  (PEPCID ) 40 mg Oral Tablet Take 1 Tablet (40 mg total) by mouth Twice daily    gabapentin  (NEURONTIN ) 100 mg Oral Capsule Take 1 Capsule (100 mg total) by mouth Every night    metFORMIN (GLUCOPHAGE XR) 500 mg Oral Tablet Sustained Release 24 hr Take 1 Tablet (500 mg total) by mouth Twice daily    sertraline  (ZOLOFT ) 50 mg Oral Tablet Take 1 Tablet (50 mg total) by mouth Daily    XARELTO  20 mg Oral Tablet TAKE 1 TABLET BY MOUTH EVERY EVENING WITH DINNER      Allergies[1]    PHYSICAL EXAM:    BP (!) 189/89   Pulse 99   Temp 36.1 C (97 F)   Resp 18   Ht 1.626 m (5' 4)   Wt 88.7 kg (195 lb 8.8 oz)   SpO2 93%   BMI 33.57 kg/m     General: Awake, alert, NAD  HEENT: Head normocephalic, atraumatic.  Mucouse membranes  moist.  PERRLA, no pallor, no icterus  Adenopathy: No palpable cervical, axillary, or inguinal adenopathy noted  Neck: No JVD, Supple, no adenopathy, No JVD  Lungs: diminished to auscultation bilaterally.  No rales, rhonchi, no wheezing  Cardiovascular: Regular rate and rhythm, No murmur, no friction rub  Abdomen: Soft, Symmetric, No tenderness, No hepatosplenomegaly, BSA x4  Extremities: No peripheral edema. Warm. No calf tenderness  Skin: Skin warm and dry.  No suspicious lesions, + covered in moles large and small of varying sizes and shapes, states has seen dermatology several times and some biopsies, negative, chronic.  Neurologic: Alert and oriented x3. Cranial nerves II through XII intact. No focal paralysis or asterixis  Psych: Mood and affect congruent for age and gender, Denies SI/HI       DIAGNOSTIC DATA:       LABS:     Basic Metabolic Profile    Lab Results   Component Value Date/Time    SODIUM 138 03/08/2024 03:25 AM    POTASSIUM 4.1 03/08/2024 03:25 AM    CHLORIDE 103 03/08/2024 03:25 AM    CO2 30 03/08/2024 03:25 AM    ANIONGAP 5 03/08/2024 03:25 AM    Lab Results   Component Value Date/Time    BUN 13 03/08/2024 03:25 AM    CREATININE 0.93 03/08/2024 03:25 AM    GLUCOSENF 117 (H) 03/08/2024 03:25 AM          CBC  Diff   Lab Results  Component Value Date/Time    WBC 4.0 03/08/2024 03:25 AM    HGB 11.6 03/08/2024 03:25 AM    HCT 34.3 03/08/2024 03:25 AM    PLTCNT 109 (L) 03/08/2024 03:25 AM    RBC 4.08 03/08/2024 03:25 AM    MCV 84.1 03/08/2024 03:25 AM    MCHC 33.8 03/08/2024 03:25 AM    MCH 28.4 03/08/2024 03:25 AM    RDW 14.8 03/08/2024 03:25 AM    MPV 9.4 03/08/2024 03:25 AM    Lab Results   Component Value Date/Time    PMNS 46 03/07/2024 03:14 PM    LYMPHOCYTES 42 03/07/2024 03:14 PM    EOSINOPHIL 2 03/07/2024 03:14 PM    MONOCYTES 9 03/07/2024 03:14 PM    BASOPHILS 1 03/07/2024 03:14 PM    BASOPHILS 0.00 03/07/2024 03:14 PM    PMNABS 1.60 (L) 03/07/2024 03:14 PM    LYMPHSABS 1.50 03/07/2024  03:14 PM    EOSABS 0.10 03/07/2024 03:14 PM    MONOSABS 0.30 03/07/2024 03:14 PM    BASABS 0.04 05/30/2022 05:40 AM            ASSESSMENT:    ICD-10-CM    1. Gastrointestinal hemorrhage, unspecified gastrointestinal hemorrhage type  K92.2       2. Bright red blood per rectum  K62.5       3. Current use of long term anticoagulation  Z79.01         1.) Abnormal radiological findings of Diffuse esophageal wall thickening     2.) Abnormal radiological findings: There is mild right renal pelviectasis and proximal right hydroureter which tapers to a region of soft tissue nodularity and small calcifications (series 901, images [id number]). Findings may be secondary to adjacent ovarian vein thrombosis and phlebolith formation however urothelial neoplasm could also have this appearance. No left hydronephrosis. The urinary bladder is moderately distended and appears thin-walled. The uterus is surgically absent.     3.) T0 N1 M0, stage IIA first diagnosed 06/18/22 via L axillary mass core biopsy showing a non small-cell carcinoma of unclear origin. 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 For some reason, We could not find an actual size measurement on the pathology report.  Multiple imaging studies of the breast were performed, but no primary lesion was found.  The malignant lesion was definitively classified as a lymph node.  Receptor staining showed that it was ER negative and HER2 Neu positive.  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 Did not require adjuvant therapies.  Molecular profile:  Positive: EGFR exon 19.  Negative: KRAS, MET, RET, ALK, ROS1.  Evaluated by PCP initially with CXR showing left lower lobe mass.  A subsequent CT showed a 2.1 x 2.1 x 2.2 cm left lower lobe mass with no associated adenopathy.  Pathology from lobectomy showed a 2.4 cm adenocarcinoma with focal clear cell features.     5.) FIGO Stage I (pT1a pN0 M0 G1) adenocarcinoma of the Endometrium (multi-focal) s/p TAH+BSO.  11/25/2007 endocervical curettage: FIGO grade 1 adenocarcinoma.  12/16/2007 TAH+BSO: FIGO grade 1 adenocarcinoma.       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 Two Independent risk factors are associated with higher risk including abnormal Kappa to lamda ratio and an M-spike > 1.5 g/d.SABRA Other risk factors include CRAB criteria including hypercalcemia, Renal insufficiency/failure, anemia, and lytic bone lesions. We would need to perform a bone marrow biopsy to differentiate between MGUS and Multiple Myeloma.     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 GI is on case for GI w/u been prepping w/ go lightly  2. Recommend urology consultation  3. Recommend better imaging of the ovarian vein thrombosis ??  4. Continue surveillance of hx of uterine, lung, and breast cancers  5. Xarelto  on hold d/t GI bleed, if cleared  from GI would recommend DC on low dose Xarelto  as long as no active DVT's, duplex doppler pending.  6. Repeat SPEP  7. B12, folate  8. AM coag studies  9. Copper  level  10. Further recommendations will be made following work up results     Jearlene Bridwell was given the chance to ask questions, and these were answered to their satisfaction. The patient is welcome to call with any questions or concerns in the meantime.     Thank you for this consultation and allowing us  to participate in this patients care.    Patient seen independently today, Case was discussed with Primary hematologist/oncologist Dr. DOROTHA Pipes, The above plan was advised, ordered, and implemented.   Etha Linger, APRN, CNP  03/08/2024, 09:55    CC:  Manus Endo, MD  [address]  [place] [address]    No referring provider defined for this encounter.         [1]   Allergies  Allergen Reactions    Adhesive Rash    Ceclor [Cefaclor]  Other Adverse Reaction (Add comment)     Pt states a Ceclor pill gave her blisters in her mouth.

## 2024-03-08 NOTE — Care Plan (Addendum)
 Problem: Adult Inpatient Plan of Care  Goal: Absence of Hospital-Acquired Illness or Injury  03/08/2024 0443 by Waddell ORN, RN  Outcome: Ongoing (see interventions/notes)  03/07/2024 2249 by Waddell ORN, RN  Outcome: Ongoing (see interventions/notes)  Intervention: Identify and Manage Fall Risk  Recent Flowsheet Documentation  Taken 03/07/2024 2217 by Waddell ORN, RN  Safety Promotion/Fall Prevention:   fall prevention program maintained   motion sensor pad activated   nonskid shoes/slippers when out of bed   safety round/check completed   activity supervised  Intervention: Prevent Skin Injury  Recent Flowsheet Documentation  Taken 03/08/2024 0400 by Waddell ORN, RN  Body Position: supine, head elevated  Taken 03/08/2024 0000 by Waddell ORN, RN  Body Position: squatting  Intervention: Prevent and Manage VTE (Venous Thromboembolism) Risk  Recent Flowsheet Documentation  Taken 03/07/2024 2226 by Waddell ORN, RN  VTE Prevention/Management: ambulation promoted  Intervention: Prevent Infection  Recent Flowsheet Documentation  Taken 03/07/2024 2217 by Waddell ORN, RN  Infection Prevention:   promote handwashing   rest/sleep promoted   single patient room provided  Goal: Optimal Comfort and Wellbeing  03/08/2024 0443 by Waddell ORN, RN  Outcome: Ongoing (see interventions/notes)  03/07/2024 2249 by Waddell ORN, RN  Outcome: Ongoing (see interventions/notes)  Intervention: Provide Person-Centered Care  Recent Flowsheet Documentation  Taken 03/07/2024 2217 by Waddell ORN, RN  Trust Relationship/Rapport: thoughts/feelings acknowledged  Goal: Rounds/Family Conference  03/08/2024 0443 by Waddell ORN, RN  Outcome: Ongoing (see interventions/notes)  03/07/2024 2249 by Waddell ORN, RN  Outcome: Ongoing (see interventions/notes)     Problem: Fall Injury Risk  Goal: Absence of Fall and Fall-Related Injury  03/08/2024 0443 by Waddell ORN, RN  Outcome: Ongoing (see interventions/notes)  03/07/2024 2249 by Waddell ORN, RN  Outcome: Ongoing (see  interventions/notes)  Intervention: Identify and Manage Contributors  Recent Flowsheet Documentation  Taken 03/07/2024 2217 by Waddell ORN, RN  Medication Review/Management: medications reviewed  Intervention: Promote Injury-Free Environment  Recent Flowsheet Documentation  Taken 03/07/2024 2217 by Waddell ORN, RN  Safety Promotion/Fall Prevention:   fall prevention program maintained   motion sensor pad activated   nonskid shoes/slippers when out of bed   safety round/check completed   activity supervised     Problem: Electrolyte Imbalance  Goal: Electrolyte Balance  03/08/2024 0443 by Waddell ORN, RN  Outcome: Ongoing (see interventions/notes)  03/07/2024 2249 by Waddell ORN, RN  Outcome: Ongoing (see interventions/notes)     Problem: Skin Injury Risk Increased  Goal: Skin Health and Integrity  03/08/2024 0443 by Waddell ORN, RN  Outcome: Ongoing (see interventions/notes)  03/07/2024 2249 by Waddell ORN, RN  Outcome: Ongoing (see interventions/notes)  Intervention: Optimize Skin Protection  Recent Flowsheet Documentation  Taken 03/07/2024 2226 by Waddell ORN, RN  Pressure Reduction Devices: Pressure redistributing mattress utilized     Problem: Nausea and Vomiting  Goal: Nausea and Vomiting Relief  03/08/2024 0443 by Waddell ORN, RN  Outcome: Ongoing (see interventions/notes)  03/07/2024 2249 by Waddell ORN, RN  Outcome: Ongoing (see interventions/notes)

## 2024-03-08 NOTE — Consults (Signed)
 [facility]  Gastroenterology/ Hepatology Consult Note    Kelsey Preston, Kelsey Preston, 83 y.o. female  Encounter Start Date:  03/07/2024  Inpatient Admission Date: 03/07/2024   Date of Birth:  1940-09-08    Date of Service: 03/08/2024     Referring physician:  No ref. provider found    Chief Complaint: rectal bleeding     Kelsey Preston is a 83 y.o., White female who we are consulted for rectal bleeding.  She presented to the ER with complaints of bright red rectal bleeding and clots.  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  Hgb stable at 11.6.  She had colonoscopy by Dr Tobie 01/2013 with polyps removed.  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 She had CT that shows  diffuse esophageal wall thickening and curvilinear hyperdensity in the dependent portion of the gastric fundus possibly secondary to ingested material however contrast extravasation and active GI bleed cannot be excluded.  She has had Golytely  prep.               REVIEW OF SYSTEMS:    Review of Systems was completed with pertinent ROS as addressed in HPI.            Medications Prior to Admission       Prescriptions    atorvastatin  (LIPITOR) 40 mg Oral Tablet    Take 1 Tablet (40 mg total) by mouth Every evening for 30 days    carvediloL  (COREG ) 12.5 mg Oral Tablet    Take 1 Tablet (12.5 mg total) by mouth Twice daily    ergocalciferol, vitamin D2, (DRISDOL) 1,250 mcg (50,000 unit) Oral Capsule    Take 1 Capsule (50,000 Units total) by mouth Every 7 days    esomeprazole magnesium  (NEXIUM) 40 mg Oral Capsule, Delayed Release(E.C.)    Take 1 Capsule (40 mg total) by mouth Every morning before breakfast    ezetimibe  (ZETIA ) 10 mg Oral Tablet    Take 1 Tablet (10 mg total) by mouth Every evening    famotidine  (PEPCID ) 40 mg Oral Tablet    Take 1 Tablet (40 mg total) by mouth Twice daily    gabapentin  (NEURONTIN ) 100 mg Oral Capsule    Take 1  Capsule (100 mg total) by mouth Every night    magnesium  oxide (MAG-OX) 400 mg Oral Tablet    Take 1 Tablet (400 mg total) by mouth Twice daily    Patient taking differently:  Take 1 Tablet (400 mg total) by mouth Daily    metFORMIN (GLUCOPHAGE XR) 500 mg Oral Tablet Sustained Release 24 hr    Take 1 Tablet (500 mg total) by mouth Twice daily    multivitamin with iron Oral Tablet    Take 1 Tablet by mouth Daily    ondansetron  (ZOFRAN ) 4 mg Oral Tablet    Take 1 Tablet (4 mg total) by mouth Every 6 hours as needed for Nausea/Vomiting Indications: prevent nausea and vomiting from cancer chemotherapy    sertraline  (ZOLOFT ) 50 mg Oral Tablet    Take 1 Tablet (50 mg total) by mouth Daily    traMADoL  (ULTRAM ) 50 mg Oral Tablet    Take 1 Tablet (50 mg total) by mouth Every 4 hours as needed for Pain    vit C/E/Zn/coppr/lutein /zeaxan (PRESERVISION AREDS-2 ORAL)    Take 1 Tablet by mouth Twice daily    XARELTO  20 mg  Oral Tablet    TAKE 1 TABLET BY MOUTH EVERY EVENING WITH DINNER             Allergies:    Allergies[1]      ED medications:   Medications Ordered/Administered in the ED   magnesium  sulfate 1 G in D5W 100 mL premix IVPB (0 g Intravenous Stopped 03/07/24 1700)   iohexol  (OMNIPAQUE  350) solution (100 mL Intravenous Given 03/07/24 1730)   polyethylene glycol electrolyte (goLYTELY ) oral liquid (has no administration in time range)         Home Meds:      Prior to Admission medications   Medication Sig Start Date End Date Taking? Authorizing Provider   atorvastatin  (LIPITOR) 40 mg Oral Tablet Take 1 Tablet (40 mg total) by mouth Every evening for 30 days 06/01/22 03/05/24  Trudy Daisy, MD   carvediloL  (COREG ) 12.5 mg Oral Tablet Take 1 Tablet (12.5 mg total) by mouth Twice daily 04/01/23  Yes Provider, Historical   ergocalciferol, vitamin D2, (DRISDOL) 1,250 mcg (50,000 unit) Oral Capsule Take 1 Capsule (50,000 Units total) by mouth Every 7 days 07/18/23   Provider, Historical   esomeprazole magnesium  (NEXIUM) 40 mg  Oral Capsule, Delayed Release(E.C.) Take 1 Capsule (40 mg total) by mouth Every morning before breakfast   Yes Provider, Historical   ezetimibe  (ZETIA ) 10 mg Oral Tablet Take 1 Tablet (10 mg total) by mouth Every evening   Yes Provider, Historical   famotidine  (PEPCID ) 40 mg Oral Tablet Take 1 Tablet (40 mg total) by mouth Twice daily 05/16/23  Yes McKenzie, Randine, APRN, CNP   gabapentin  (NEURONTIN ) 100 mg Oral Capsule Take 1 Capsule (100 mg total) by mouth Every night 08/13/23  Yes Moses Agent, MD   magnesium  oxide (MAG-OX) 400 mg Oral Tablet Take 1 Tablet (400 mg total) by mouth Twice daily  Patient taking differently: Take 1 Tablet (400 mg total) by mouth Daily    Provider, Historical   metFORMIN (GLUCOPHAGE XR) 500 mg Oral Tablet Sustained Release 24 hr Take 1 Tablet (500 mg total) by mouth Twice daily 08/28/22  Yes Provider, Historical   multivitamin with iron Oral Tablet Take 1 Tablet by mouth Daily    Provider, Historical   ondansetron  (ZOFRAN ) 4 mg Oral Tablet Take 1 Tablet (4 mg total) by mouth Every 6 hours as needed for Nausea/Vomiting Indications: prevent nausea and vomiting from cancer chemotherapy 10/17/23   Sherrilee Randine, APRN, CNP   sertraline  (ZOLOFT ) 50 mg Oral Tablet Take 1 Tablet (50 mg total) by mouth Daily   Yes Provider, Historical   traMADoL  (ULTRAM ) 50 mg Oral Tablet Take 1 Tablet (50 mg total) by mouth Every 4 hours as needed for Pain 09/26/22   Duremdes, Gene B, MD   vit C/E/Zn/coppr/lutein terance (PRESERVISION AREDS-2 ORAL) Take 1 Tablet by mouth Twice daily    Provider, Historical   XARELTO  20 mg Oral Tablet TAKE 1 TABLET BY MOUTH EVERY EVENING WITH DINNER 01/20/23  Yes Arlyss Slough, APRN, CNP   cholecalciferol , vitamin D3, 1,250 mcg (50,000 unit) Oral Capsule Take 1 Capsule (50,000 Units total) by mouth Every 7 days 07/18/23 03/07/24  Sherrilee Randine, APRN, CNP   lidocaine -prilocaine  (EMLA ) 2.5-2.5 % Cream One time 02/11/23 03/07/24  Provider, Historical          Inpatient Meds:       atorvastatin  (LIPITOR) tablet, 40 mg, Oral, QPM  carvedilol  (COREG ) tablet, 12.5 mg, Oral, 2X/day  Correction/SSIP insulin  lispro 100 units/mL injection, 1-5 Units, Subcutaneous, 4x/day AC  dextrose  (GLUTOSE) 40% oral gel, 15 g, Oral, Q15 Min PRN  dextrose  50% (0.5 g/mL) injection - syringe, 12.5 g, Intravenous, Q15 Min PRN  ezetimibe  (ZETIA ) tablet, 10 mg, Oral, QPM  famotidine  (PEPCID ) tablet, 40 mg, Oral, 2x/day  gabapentin  (NEURONTIN ) capsule, 100 mg, Oral, NIGHTLY  glucagon  injection 1 mg, 1 mg, IntraMUSCULAR, Once PRN  LR premix infusion, , Intravenous, Continuous  magnesium  oxide (MAG-OX) 400mg  (241.3 mg elemental magnesium ) tablet, 400 mg, Oral, 2x/day  magnesium  sulfate 1 G in D5W 100 mL premix IVPB, 1 g, Intravenous, Once  multivitamin (PRESERVISION AREDS 2) with minerals chewable tablet, 1 Tablet, Oral, 2x/day  multivitamin (THERA) tablet, 1 Tablet, Oral, Daily  ondansetron  (ZOFRAN  ODT) rapid dissolve tablet, 4 mg, Oral, Q6H PRN  pantoprazole  (PROTONIX ) delayed release tablet, 40 mg, Oral, Daily  sertraline  (ZOLOFT ) tablet, 50 mg, Oral, Daily  traMADol  (ULTRAM ) tablet, 50 mg, Oral, Q4H PRN             PMHx:    Past Medical History:   Diagnosis Date    Diabetes mellitus, type 2     Embolism (CMS HCC)     left leg    Endometrial cancer     HTN (hypertension)     Hx of breast cancer     Lung cancer     Macular degeneration (senile) of retina         PSHx:   Past Surgical History:   Procedure Laterality Date    HIP SURGERY Left     HX CHOLECYSTECTOMY      HX COLONOSCOPY      HX HYSTERECTOMY      HX LOBECTOMY Left     LUNG CANCER SURGERY      PORTACATH PLACEMENT            Family History  Family Medical History:       Problem Relation (Age of Onset)    Cancer Brother           Social History  Social History[2]          Previous GI Procedures:  03/08/2024      Vital Signs:  Temp (24hrs) Max:36.4 C (97.5 F)      Systolic (24hrs), Avg:173 , Min:136 , Max:209     Diastolic (24hrs), Avg:88, Min:54,  Max:178    Temp  Avg: 36.2 C (97.2 F)  Min: 36.1 C (97 F)  Max: 36.4 C (97.5 F)  MAP (Non-Invasive)  Avg: 113 mmHG  Min: 84 mmHG  Max: 189 mmHG  Pulse  Avg: 64.7  Min: 58  Max: 99  Resp  Avg: 17  Min: 12  Max: 22  SpO2  Avg: 97.2 %  Min: 91 %  Max: 100 %           PHYSICAL EXAM:   Temperature: 36.1 C (97 F)  Heart Rate: 99  BP (Non-Invasive): (!) 189/89  Respiratory Rate: 18  SpO2: 93 %  Vital signs reviewed  General  No acute distress  Skin color normal  HEENT  Normal, no icterus, neck supple  Lungs clear bilaterally  Card RRR,   Abdomen  BS active, no tenderness, no guarding, no distension,   Ext  no edema       RELEVANT LABS:  Labs:    Results for orders placed or performed during the hospital encounter of 03/07/24 (from the past 24 hours)   BASIC METABOLIC PANEL   Result Value Ref Range    SODIUM 140 136 -  145 mmol/L    POTASSIUM 4.7 3.5 - 5.1 mmol/L    CHLORIDE 104 98 - 107 mmol/L    CO2 TOTAL 33 (H) 21 - 31 mmol/L    ANION GAP 3 (L) 4 - 13 mmol/L    CALCIUM 10.4 (H) 8.6 - 10.3 mg/dL    GLUCOSE 790 (H) 74 - 109 mg/dL    BUN 16 7 - 25 mg/dL    CREATININE 8.89 9.39 - 1.30 mg/dL    BUN/CREA RATIO 15 6 - 22    ESTIMATED GFR 50 (L) >59 mL/min/1.51m2    OSMOLALITY, CALCULATED 287 270 - 290 mOsm/kg   CBC WITH DIFF   Result Value Ref Range    WBC 3.6 (L) 3.8 - 11.8 x103/uL    RBC 3.87 3.63 - 4.92 x106/uL    HGB 10.9 10.9 - 14.3 g/dL    HCT 67.1 68.7 - 58.0 %    MCV 84.9 75.5 - 95.3 fL    MCH 28.3 24.7 - 32.8 pg    MCHC 33.3 32.3 - 35.6 g/dL    RDW 84.9 87.6 - 82.2 %    PLATELETS 126 (L) 140 - 440 x103/uL    MPV 9.1 7.9 - 10.8 fL    NEUTROPHIL % 46 43 - 77 %    LYMPHOCYTE % 42 16 - 46 %    MONOCYTE % 9 4 - 11 %    EOSINOPHIL % 2 1 - 7 %    BASOPHIL % 1 0 - 1 %    NEUTROPHIL # 1.60 (L) 1.90 - 8.20 x103/uL    LYMPHOCYTE # 1.50 1.10 - 3.10 x103/uL    MONOCYTE # 0.30 0.20 - 0.90 x103/uL    EOSINOPHIL # 0.10 0.00 - 0.50 x103/uL    BASOPHIL # 0.00 0.00 - 0.10 x103/uL   MAGNESIUM    Result Value Ref Range     MAGNESIUM  1.5 (L) 1.9 - 2.7 mg/dL   CBC   Result Value Ref Range    WBC 3.4 (L) 3.8 - 11.8 x103/uL    RBC 3.83 3.63 - 4.92 x106/uL    HGB 10.8 (L) 10.9 - 14.3 g/dL    HCT 67.7 68.7 - 58.0 %    MCV 84.2 75.5 - 95.3 fL    MCH 28.3 24.7 - 32.8 pg    MCHC 33.6 32.3 - 35.6 g/dL    RDW 85.0 87.6 - 82.2 %    PLATELETS 112 (L) 140 - 440 x103/uL    MPV 8.9 7.9 - 10.8 fL   CBC   Result Value Ref Range    WBC 3.8 3.8 - 11.8 x103/uL    RBC 3.99 3.63 - 4.92 x106/uL    HGB 11.3 10.9 - 14.3 g/dL    HCT 66.4 68.7 - 58.0 %    MCV 84.0 75.5 - 95.3 fL    MCH 28.3 24.7 - 32.8 pg    MCHC 33.7 32.3 - 35.6 g/dL    RDW 85.1 87.6 - 82.2 %    PLATELETS 110 (L) 140 - 440 x103/uL    MPV 8.7 7.9 - 10.8 fL   CBC   Result Value Ref Range    WBC 4.0 3.8 - 11.8 x103/uL    RBC 4.08 3.63 - 4.92 x106/uL    HGB 11.6 10.9 - 14.3 g/dL    HCT 65.6 68.7 - 58.0 %    MCV 84.1 75.5 - 95.3 fL    MCH 28.4 24.7 - 32.8 pg  MCHC 33.8 32.3 - 35.6 g/dL    RDW 85.1 87.6 - 82.2 %    PLATELETS 109 (L) 140 - 440 x103/uL    MPV 9.4 7.9 - 10.8 fL   COMPREHENSIVE METABOLIC PANEL, NON-FASTING   Result Value Ref Range    SODIUM 138 136 - 145 mmol/L    POTASSIUM 4.1 3.5 - 5.1 mmol/L    CHLORIDE 103 98 - 107 mmol/L    CO2 TOTAL 30 21 - 31 mmol/L    ANION GAP 5 4 - 13 mmol/L    BUN 13 7 - 25 mg/dL    CREATININE 9.06 9.39 - 1.30 mg/dL    BUN/CREA RATIO 14 6 - 22    ESTIMATED GFR 62 >59 mL/min/1.21m2    ALBUMIN 3.6 3.5 - 5.7 g/dL    CALCIUM 89.6 8.6 - 89.6 mg/dL    GLUCOSE 882 (H) 74 - 109 mg/dL    ALKALINE PHOSPHATASE 89 34 - 104 U/L    ALT (SGPT) 16 7 - 52 U/L    AST (SGOT) 27 13 - 39 U/L    BILIRUBIN TOTAL 0.8 0.3 - 1.0 mg/dL    PROTEIN TOTAL 6.3 (L) 6.4 - 8.9 g/dL    ALBUMIN/GLOBULIN RATIO 1.3 0.8 - 1.4    OSMOLALITY, CALCULATED 277 270 - 290 mOsm/kg    CALCIUM, CORRECTED 10.6 8.9 - 10.8 mg/dL    GLOBULIN 2.7 2.0 - 3.5   MAGNESIUM    Result Value Ref Range    MAGNESIUM  1.7 (L) 1.9 - 2.7 mg/dL   POC BLOOD GLUCOSE (RESULTS)   Result Value Ref Range    GLUCOSE, POC  131 (H) 70 - 100 mg/dl          CEA   Date Value Ref Range Status   06/24/2022 1.2 <=5.0 ng/mL Final      CBC  Diff   Lab Results   Component Value Date/Time    WBC 4.0 03/08/2024 03:25 AM    HGB 11.6 03/08/2024 03:25 AM    HCT 34.3 03/08/2024 03:25 AM    PLTCNT 109 (L) 03/08/2024 03:25 AM    RBC 4.08 03/08/2024 03:25 AM    MCV 84.1 03/08/2024 03:25 AM    MCHC 33.8 03/08/2024 03:25 AM    MCH 28.4 03/08/2024 03:25 AM    RDW 14.8 03/08/2024 03:25 AM    MPV 9.4 03/08/2024 03:25 AM    Lab Results   Component Value Date/Time    PMNS 46 03/07/2024 03:14 PM    LYMPHOCYTES 42 03/07/2024 03:14 PM    EOSINOPHIL 2 03/07/2024 03:14 PM    MONOCYTES 9 03/07/2024 03:14 PM    BASOPHILS 1 03/07/2024 03:14 PM    BASOPHILS 0.00 03/07/2024 03:14 PM    PMNABS 1.60 (L) 03/07/2024 03:14 PM    LYMPHSABS 1.50 03/07/2024 03:14 PM    EOSABS 0.10 03/07/2024 03:14 PM    MONOSABS 0.30 03/07/2024 03:14 PM    BASABS 0.04 05/30/2022 05:40 AM           CEA   Date Value Ref Range Status   06/24/2022 1.2 <=5.0 ng/mL Final    @LASTIFOB @    Imaging Studies:  No results found for this or any previous visit (from the past 824799999 hours).  No results found for this or any previous visit (from the past 824799999 hours).  Recent Results (from the past 824799999 hours)   CT ABDOMEN PELVIS W IV CONTRAST    Collection Time: 01/05/23 12:02 PM    Narrative  Deyanna Morua    RADIOLOGIST: Kelby Frame    CT ABDOMEN PELVIS W IV CONTRAST performed on 01/05/2023 12:02 PM    CLINICAL HISTORY: Left flank pain, worsening..  Left flank pain, worsening., hx left hip replacement, endometrial cancer, breast cancer, cholecystectomy, hysterectomy    TECHNIQUE:  Abdomen and pelvis CT with intravenous contrast.  IV CONTRAST: 75 ml's of Omnipaque  350    COMPARISON:  None.    FINDINGS:  Lung bases: Atelectatic densities are present. There is hiatal hernia present coronary calcifications are seen. No acute pleural disease    Liver:   Unremarkable.    Gallbladder:   Gallbladder  surgically absent. No definite common duct dilatation or stone    Spleen:   Maximal length is around 13 cm which is upper normal    Pancreas:   Unremarkable.    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 There is an associated calcification. Similar to the prior exam at around 2.1 cm.    Kidneys:   Low density renal cysts. Both kidneys are enhancing. No definite new perinephric findings. No obstructing stones or hydronephrosis detected.    Bladder:  Unremarkable.    Uterus and Adnexa:  Prior hysterectomy.  Adnexal regions are unremarkable.    Bowel:   Liquid stool mixing with solid in the colon. Some mildly distended fluid-filled small bowel loops are present. This pattern could represent enteritis in the appropriate clinical setting. Difficult to exclude wall thickening at the gastric antrum, suspicious for inflammatory disease. Endoscopy could be considered if not recently done. There is some questionable peripheral lucency at the distal aspect of the antrum concerning for possible ulcer formation in this area. There is no perigastric fluid or gas to indicate perforated ulceration.    Appendix:  No appendicitis pattern    Lymph nodes:  No suspicious lymph node enlargement.    Vasculature:   Calcified wall plaque at the aorta and major branches similar to the old exam. No aneurysm     Peritoneum / Retroperitoneum: No ascites.  No free air.    Bones:   Slight increase in inferior endplate compression deformity at L1 compared to the recent CT scan, correlate for any interval trauma. This is concerning for possible subtle compression fracture.        Impression    The bowel gas pattern is suspicious for possible enteritis which could be infectious  Gastritis with questionable gastric antral ulceration  Compression deformity of L1 not seen previously could indicate interval developing compression fracture. Further investigation with bone scan or MRI may be useful. (No bone retropulsion)  Nonspecific  subcutaneous left mid and lower paraspinal haziness may represent edema or bruising. No large focal hematomas are seen       One or more dose reduction techniques were used (e.g., Automated exposure control, adjustment of the mA and/or kV according to patient size, use of iterative reconstruction technique).      Radiologist location ID: [id number]       No results found for this or any previous visit (from the past 824799999 hours).  No results found for this or any previous visit (from the past 824799999 hours).  No results found for this or any previous visit (from the past 824799999 hours).    Problem List  Hospital Problems    1Rectal bleeding         Date Noted: 03/07/2024      Gastrointestinal hemorrhage, unspecified gastrointestinal hemorrhage type  Date Noted: 03/07/2024      Current use of long term anticoagulation         Date Noted: 03/07/2024      Bright red blood per rectum         Date Noted: 03/07/2024      Resolved Hospital Problems  No resolved problems to display.      Assessment/Plan and Recommendations:  GI bleeding - agree with current management.  Will plan for EGD and flex sig today to assess GI bleeding.  Risks of procedure discussed with patient including bleeding, infection, perforation, allergic reaction and slowing of respirations and she is agreeable to proceed.  Monitor H&H and observe for active GI bleeding.  Further recommendations per Dr Tobie following procedure and dependent on hospital course.  Patient discussed in detail with Dr MARLA Tobie and treatment plan decided by him.  Thank for this consultation.      Rock Mocha, APRN, CNP           [1]   Allergies  Allergen Reactions    Adhesive Rash    Ceclor [Cefaclor]  Other Adverse Reaction (Add comment)     Pt states a Ceclor pill gave her blisters in her mouth.   [2]   Social History  Tobacco Use    Smoking status: Never    Smokeless tobacco: Never   Vaping Use    Vaping status: Never Used   Substance Use Topics    Alcohol   use: Never    Drug use: Never

## 2024-03-08 NOTE — Nurses Notes (Signed)
 Patient requested ODT Zofran  to prevent nighttime N/V. Will reassess in one hour.

## 2024-03-08 NOTE — Consults (Signed)
 [facility]   INITIAL CONSULT NOTE    Kelsey Preston, Kelsey Preston, 83 y.o. female  Date of Admission:  03/07/2024  Date of Birth:  1940-11-19    Information Obtained from: patient, spouse, daughter, son, and history reviewed via medical record  Chief Complaint:  Questionable lesion and bladder on CT    History of Present Illness:  Kelsey Preston is a 83 y.o. female who presents with  (Per Dr. Theressa H&P) history of breast cancer and blood clots who presents with significant rectal bleeding.     She has been experiencing intermittent rectal bleeding, particularly when constipated. Initially, the bleeding was minimal, described as bright red blood on wiping. However, today she experienced a significant increase in bleeding, which was constant and accompanied by clots, severe enough to soak through and drip onto the floor.     No nausea, vomiting, abdominal pain, or chest pain. She has been experiencing dizziness over the past two days.     She is currently on Xarelto  for a previous blood clot.     Her past medical history includes breast cancer, diagnosed through blood work and a biopsy of a spot under her armpit, despite imaging not identifying a source in the breast. She underwent chemotherapy a year ago and has not had any treatment this year. She experiences nausea in the late evening, which she attributes to her cancer history.     Her blood pressure was previously 150/60.      Patient also has history of lung cancer endometrial cancer.  Patient when her hospital admission workup received a CT scan of the chest, abdomen and pelvis.  Multiple abnormalities were noted.  Patient also states that she has incontinence for the last 2 years.       Study Result    Narrative & Impression   Ahyana Lea     RADIOLOGIST: Katelyn A Ziggas     CT ANGIO CHEST ABDOMEN PELVIS W IV CONTRAST performed on 03/07/2024 5:28 PM     CLINICAL HISTORY: rectal bleeding.  rectal bleeding, hc. cholecystectomy, hysterectomy, breast  cancer     TECHNIQUE: CTA imaging of the chest, abdomen, and pelvis with intravenous contrast.  Precontrast images were also performed from the thoracic inlet to the aortic bifurcation.  3D reconstructions.  IV CONTRAST: 100 ml's of Omnipaque  350     COMPARISON: PET/CT 12/23/2022. CT abdomen/pelvis 01/05/2023. CT chest 05/29/2022. Lumbar spine CT 01/27/2023.      FINDINGS:     Please note, evaluation for active GI bleed is limited due to a lack of precontrast imaging.     Heart:  The heart is enlarged. Coronary arterial calcifications are present.   Pulmonary Vessels:  No large central filling defects.  Contrast timing was optimized for evaluation of the aorta.     Arch Vessels: Patent.  Thoracic Aorta:  No thoracic aortic aneurysm or dissection. Mild atherosclerosis.  Abdominal Aorta: No aneurysm or dissection. Atherosclerosis.     Mesenteric Arteries: Patent with atherosclerotic plaque.  Renal Arteries: Patent with atherosclerotic plaque. 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  Iliac Arteries: Patent with atherosclerotic plaque.     Other Findings:     Right chest wall port catheter tip in the SVC. No pathologically enlarged thoracic lymph nodes are identified. No pleural effusions or pneumothorax. Shallow inspiration with mild dependent atelectasis. Stable 0.7 cm groundglass right middle lobe nodule (series 904, image 97). Stable 0.5 cm left upper lobe nodule (image 50). Status left lower  lobectomy.     Diffuse esophageal wall thickening. Small hiatal hernia. Unremarkable arterial phase appearance of the liver, pancreas, and right adrenal gland. Partially calcified 1.6 cm left adrenal nodule is unchanged. The spleen is top normal in size measuring 12.5 cm, not significantly changed. Left renal cyst measuring 2.2 cm. Small hypoattenuating lesions are too small to characterize but statistically favor cysts. There is mild right renal pelviectasis and proximal right hydroureter which  tapers to a region of soft tissue nodularity and small calcifications (series 901, images 236-256). Findings may be secondary to adjacent ovarian vein thrombosis and phlebolith formation however urothelial neoplasm could also have this appearance. No left hydronephrosis. The urinary bladder is moderately distended and appears thin-walled. The uterus is surgically absent.     No bowel obstruction. Underdistention of the bowel limits evaluation of wall thickness. Curvilinear hyperdensity in the dependent portion of the gastric fundus (see series 901, images 131-156), possibly secondary to ingested material however contrast extravasation and active GI bleed cannot be excluded. Please note, evaluation is limited due to a lack of precontrast imaging.     No free intraperitoneal air or ascites.     The bones are demineralized. Status post ORIF of the proximal left femur. Chronic, severe L1 inferior endplate compression fracture with interval loss of height when compared to November 2024. There is a 0.4 bulging of the posterior superior cortex, not significantly changed.     IMPRESSION:  CURVILINEAR HYPERDENSITY IN THE DEPENDENT PORTION OF THE STOMACH POSSIBLY DUE TO MATERIAL HOWEVER CONTRAST EXTRAVASATION DUE TO ACTIVE GI BLEED CAN NOT BE EXCLUDED. PLEASE NOTE, EVALUATION IS LIMITED WITHOUT PRECONTRAST IMAGING.     DIFFUSE ESOPHAGEAL WALL THICKENING. CORRELATE FOR ESOPHAGITIS. CONSIDER UPPER ENDOSCOPY.     MILD RIGHT RENAL PELVIECTASIS AND PROXIMAL RIGHT HYDROURETER WITH TAPERING TO A REGION OF SOFT TISSUE NODULARITY AND SMALL CALCIFICATIONS WHICH MAY BE SECONDARY TO ADJACENT OVARIAN VEIN THROMBOSIS AND PHLEBOLITHS HOWEVER UROTHELIAL NEOPLASM COULD ALSO HAVE THIS APPEARANCE. RECOMMEND FURTHER EVALUATION WITH NONEMERGENT CT UROGRAM.     CHRONIC, SEVERE L1 INFERIOR ENDPLATE COMPRESSION FRACTURE WITH INTERVAL LOSS OF HEIGHT WHEN COMPARED TO NOVEMBER 2024.     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     ADDITIONAL CHRONIC AND INCIDENTAL FINDINGS AS ABOVE.        These important findings were relayed via secure EPIC chat to the ordering provider Vanice Pinal, PA  on 03/07/2024 at 5:54 PM.     One or more dose reduction techniques were used (e.g., Automated exposure control, adjustment of the mA and/or kV according to patient size, use of iterative reconstruction technique).     A Critical Red actionable finding has been sent via the [employer] Actionable Findings application on 03/07/2024 5:54 PM, Message ID [id number]. Receipt of this communication will be communicated to [facility] or responsible provider and will be documented in [employer] Actionable Findings System upon receiving the acknowledgement.        Radiologist location ID: [id number]             Past Medical History:   Diagnosis Date    Diabetes mellitus, type 2     Embolism (CMS HCC)     left leg    Endometrial cancer     HTN (hypertension)     Hx of breast cancer     Lung cancer     Macular degeneration (senile) of retina          Past Surgical History:  Procedure Laterality Date    HIP SURGERY Left     HX CHOLECYSTECTOMY      HX COLONOSCOPY      HX HYSTERECTOMY      HX LOBECTOMY Left     LUNG CANCER SURGERY      PORTACATH PLACEMENT           Medications Prior to Admission       Prescriptions    atorvastatin  (LIPITOR) 40 mg Oral Tablet    Take 1 Tablet (40 mg total) by mouth Every evening for 30 days    carvediloL  (COREG ) 12.5 mg Oral Tablet    Take 1 Tablet (12.5 mg total) by mouth Twice daily    ergocalciferol, vitamin D2, (DRISDOL) 1,250 mcg (50,000 unit) Oral Capsule    Take 1 Capsule (50,000 Units total) by mouth Every 7 days    esomeprazole magnesium  (NEXIUM) 40 mg Oral Capsule, Delayed Release(E.C.)    Take 1 Capsule (40 mg total) by mouth Every morning before breakfast    ezetimibe  (ZETIA ) 10 mg Oral Tablet    Take 1 Tablet (10 mg total) by mouth Every evening    famotidine  (PEPCID ) 40 mg Oral Tablet    Take  1 Tablet (40 mg total) by mouth Twice daily    gabapentin  (NEURONTIN ) 100 mg Oral Capsule    Take 1 Capsule (100 mg total) by mouth Every night    magnesium  oxide (MAG-OX) 400 mg Oral Tablet    Take 1 Tablet (400 mg total) by mouth Twice daily    Patient taking differently:  Take 1 Tablet (400 mg total) by mouth Daily    metFORMIN (GLUCOPHAGE XR) 500 mg Oral Tablet Sustained Release 24 hr    Take 1 Tablet (500 mg total) by mouth Twice daily    multivitamin with iron Oral Tablet    Take 1 Tablet by mouth Daily    ondansetron  (ZOFRAN ) 4 mg Oral Tablet    Take 1 Tablet (4 mg total) by mouth Every 6 hours as needed for Nausea/Vomiting Indications: prevent nausea and vomiting from cancer chemotherapy    sertraline  (ZOLOFT ) 50 mg Oral Tablet    Take 1 Tablet (50 mg total) by mouth Daily    traMADoL  (ULTRAM ) 50 mg Oral Tablet    Take 1 Tablet (50 mg total) by mouth Every 4 hours as needed for Pain    vit C/E/Zn/coppr/lutein /zeaxan (PRESERVISION AREDS-2 ORAL)    Take 1 Tablet by mouth Twice daily    XARELTO  20 mg Oral Tablet    TAKE 1 TABLET BY MOUTH EVERY EVENING WITH DINNER          Allergies[1]  Social History     Socioeconomic History    Marital status: Married   Tobacco Use    Smoking status: Never    Smokeless tobacco: Never   Vaping Use    Vaping status: Never Used   Substance and Sexual Activity    Alcohol  use: Never    Drug use: Never    Sexual activity: Not Currently     Social Determinants of Health     Financial Resource Strain: Medium Risk (01/29/2023)    [facility] from [facility]    Overall Financial Resource Strain (CARDIA)     Difficulty of Paying Living Expenses: Somewhat hard   Transportation Needs: No Transportation Needs (01/29/2023)    Received from [facility] - [facility]     Lack of Transportation (Medical): No     Lack  of Transportation (Non-Medical): No   Social Connections: Low Risk (03/07/2024)    Social Connections     SDOH Social Isolation: 5 or more times a week    Intimate Partner Violence: Not At Risk (01/29/2023)    Received from [facility]    Humiliation, Afraid, Rape, and Kick questionnaire     Within the last year, have you been afraid of your partner or ex-partner?: No     Within the last year, have you been humiliated or emotionally abused in other ways by your partner or ex-partner?: No     Within the last year, have you been kicked, hit, slapped, or otherwise physically hurt by your partner or ex-partner?: No     Within the last year, have you been raped or forced to have any kind of sexual activity by your partner or ex-partner?: No   Housing Stability: Low Risk  (01/29/2023)    Received from [facility] Stability Vital Sign     In the last 12 months, was there a time when you were not able to pay the mortgage or rent on time?: No     In the past 12 months, how many times have you moved where you were living?: 1     At any time in the past 12 months, were you homeless or living in a shelter (including now)?: No     Family Medical History:       Problem Relation (Age of Onset)    Cancer Brother             Review of Systems:  All pertinent review of systems as address and detailed in HPI    Vital Signs:  Temperature: 36.3 C (97.3 F)  Heart Rate: 67  BP (Non-Invasive): (!) 161/68  Respiratory Rate: 17  SpO2: 98 %    Exam:  Temperature: 36.3 C (97.3 F)  Heart Rate: 67  BP (Non-Invasive): (!) 161/68  Respiratory Rate: 17  SpO2: 98 %  General: appears in good health, comfortable  Eyes: Pupils equal and round, reactive to light and accomodation. , Conjunctivae/corneas clear, EOM's intact, no nystagmus.   HENT: Normocephalic, atraumatic.  Nose without erythema, polyps or rhinorrhea.  Mouth mucous membranes moist , Pharynx without injection or exudate, no oral lesions.   Neck: supple, no lymphadenopathy   Lungs: clear to auscultation bilaterally.   Cardiovascular:    Heart regular rate and rhythm, no murmur, click, rub or gallop  Abdomen: soft,  non-tender, non-distended, no organomegaly, no masses and no hernias  GU Exam deferred  Extremities: no edema, redness or tenderness in the calves or thighs, no varicosities  Skin: Skin color, texture, turgor normal. No rashes or lesions  Neurologic: gait is normal, DTR intact bilaterally.   Alert and oriented x3  Lymphatics: no lymphadenopathy  Psychiatric: affect normal, behavior normal, thought content normal, judgement normal     LABS:  I have reviewed all lab results.  Lab Results Today:    Results for orders placed or performed during the hospital encounter of 03/07/24 (from the past 24 hours)   CBC   Result Value Ref Range    WBC 3.4 (L) 3.8 - 11.8 x103/uL    RBC 3.83 3.63 - 4.92 x106/uL    HGB 10.8 (L) 10.9 - 14.3 g/dL    HCT 67.7 68.7 - 58.0 %    MCV 84.2 75.5 - 95.3 fL    MCH 28.3 24.7 - 32.8 pg  MCHC 33.6 32.3 - 35.6 g/dL    RDW 85.0 87.6 - 82.2 %    PLATELETS 112 (L) 140 - 440 x103/uL    MPV 8.9 7.9 - 10.8 fL   CBC   Result Value Ref Range    WBC 3.8 3.8 - 11.8 x103/uL    RBC 3.99 3.63 - 4.92 x106/uL    HGB 11.3 10.9 - 14.3 g/dL    HCT 66.4 68.7 - 58.0 %    MCV 84.0 75.5 - 95.3 fL    MCH 28.3 24.7 - 32.8 pg    MCHC 33.7 32.3 - 35.6 g/dL    RDW 85.1 87.6 - 82.2 %    PLATELETS 110 (L) 140 - 440 x103/uL    MPV 8.7 7.9 - 10.8 fL   CBC   Result Value Ref Range    WBC 4.0 3.8 - 11.8 x103/uL    RBC 4.08 3.63 - 4.92 x106/uL    HGB 11.6 10.9 - 14.3 g/dL    HCT 65.6 68.7 - 58.0 %    MCV 84.1 75.5 - 95.3 fL    MCH 28.4 24.7 - 32.8 pg    MCHC 33.8 32.3 - 35.6 g/dL    RDW 85.1 87.6 - 82.2 %    PLATELETS 109 (L) 140 - 440 x103/uL    MPV 9.4 7.9 - 10.8 fL   COMPREHENSIVE METABOLIC PANEL, NON-FASTING   Result Value Ref Range    SODIUM 138 136 - 145 mmol/L    POTASSIUM 4.1 3.5 - 5.1 mmol/L    CHLORIDE 103 98 - 107 mmol/L    CO2 TOTAL 30 21 - 31 mmol/L    ANION GAP 5 4 - 13 mmol/L    BUN 13 7 - 25 mg/dL    CREATININE 9.06 9.39 - 1.30 mg/dL    BUN/CREA RATIO 14 6 - 22    ESTIMATED GFR 62 >59 mL/min/1.32m2     ALBUMIN 3.6 3.5 - 5.7 g/dL    CALCIUM 89.6 8.6 - 89.6 mg/dL    GLUCOSE 882 (H) 74 - 109 mg/dL    ALKALINE PHOSPHATASE 89 34 - 104 U/L    ALT (SGPT) 16 7 - 52 U/L    AST (SGOT) 27 13 - 39 U/L    BILIRUBIN TOTAL 0.8 0.3 - 1.0 mg/dL    PROTEIN TOTAL 6.3 (L) 6.4 - 8.9 g/dL    ALBUMIN/GLOBULIN RATIO 1.3 0.8 - 1.4    OSMOLALITY, CALCULATED 277 270 - 290 mOsm/kg    CALCIUM, CORRECTED 10.6 8.9 - 10.8 mg/dL    GLOBULIN 2.7 2.0 - 3.5   MAGNESIUM    Result Value Ref Range    MAGNESIUM  1.7 (L) 1.9 - 2.7 mg/dL   POC BLOOD GLUCOSE (RESULTS)   Result Value Ref Range    GLUCOSE, POC 131 (H) 70 - 100 mg/dl   VITAMIN A87   Result Value Ref Range    VITAMIN B 12 583 180 - 914 pg/mL   FOLATE   Result Value Ref Range    FOLATE 43.6 (H) 5.9 - 24.8 ng/mL   POC BLOOD GLUCOSE (RESULTS)   Result Value Ref Range    GLUCOSE, POC 154 (H) 70 - 100 mg/dl   POC BLOOD GLUCOSE (RESULTS)   Result Value Ref Range    GLUCOSE, POC 111 (H) 70 - 100 mg/dl       Radiology Results: No results found.     Immunization History   Administered Date(s) Administered    Covid-19 Vaccine,Moderna  Bivalent 12/13/2020    Covid-19 Vaccine,Moderna,12 Years+ 04/30/2019, 05/27/2019, 02/08/2020, 10/09/2020    Influenza Vaccine, 65+ (FLUAD ) 12/20/2022, 12/19/2023    VAXNEUVANCE 10/08/2022       DNR Status:  Prior    ASSESSMENT:  Patient has had multiple cancers.  She is a never smoker.  She has not had hematuria.  She has a finding on a CT scan per the radiologist stating that bladder neoplasm could not be ruled out.  I personally reviewed the CT scan I do not see any signs of a bladder neoplasm.  This is most likely an over-read. However there is concern please feel free the order a CT urogram/cystogram.     Active Hospital Problems    Diagnosis    Primary Problem: Rectal bleeding    Swelling of lower leg    Thrombocytopenia (CMS HCC)    Neutropenia    History of uterine cancer    History of adenocarcinoma of lung    Gastrointestinal hemorrhage, unspecified gastrointestinal  hemorrhage type    Current use of long term anticoagulation    Bright red blood per rectum     DVT/PE Prophylaxis: Managed by the primary service  PLAN:    Discussed with patient the need to follow up with Urology for flexible cystoscopy and evaluation for her incontinence.  Patient has significantly low risk for bladder neoplasm.     Marsha Rosa, MD,       [1]   Allergies  Allergen Reactions    Adhesive Rash    Ceclor [Cefaclor]  Other Adverse Reaction (Add comment)     Pt states a Ceclor pill gave her blisters in her mouth.

## 2024-03-08 NOTE — Care Plan (Signed)
 Goal Outcome Evaluation:     Anxieties, Fears or Concerns: none expressed at this time (03/08/24 0845)  Individualized Care Needs: monitor labs/vitals (03/08/24 0845)  Patient-Specific Goals (Include Timeframe): dc home when appropriate (03/08/24 0845)  Plan of Care Reviewed With: patient (03/08/24 0845)     Patient Progress: improving      Problem: Adult Inpatient Plan of Care  Goal: Absence of Hospital-Acquired Illness or Injury  Outcome: Ongoing (see interventions/notes)  Intervention: Identify and Manage Fall Risk  Recent Flowsheet Documentation  Taken 03/08/2024 0845 by Lonni FALCON, RN  Safety Promotion/Fall Prevention: fall prevention program maintained  Intervention: Prevent Skin Injury  Recent Flowsheet Documentation  Taken 03/08/2024 1600 by Lonni FALCON, RN  Body Position: supine, head elevated  Taken 03/08/2024 1200 by Lonni FALCON, RN  Body Position:   supine   supine, head elevated  Taken 03/08/2024 1100 by Lonni FALCON, RN  Skin Protection: adhesive use limited  Taken 03/08/2024 1000 by Lonni FALCON, RN  Body Position:   supine   supine, head elevated  Taken 03/08/2024 0800 by Lonni FALCON, RN  Body Position:   supine   supine, head elevated  Intervention: Prevent and Manage VTE (Venous Thromboembolism) Risk  Recent Flowsheet Documentation  Taken 03/08/2024 0800 by Lonni FALCON, RN  Sequential Compression Device (SCDs): Sequential compression device on  Goal: Optimal Comfort and Wellbeing  Outcome: Ongoing (see interventions/notes)  Intervention: Provide Person-Centered Care  Recent Flowsheet Documentation  Taken 03/08/2024 0845 by Lonni FALCON, RN  Trust Relationship/Rapport: care explained  Goal: Rounds/Family Conference  Outcome: Ongoing (see interventions/notes)     Problem: Fall Injury Risk  Goal: Absence of Fall and Fall-Related Injury  Outcome: Ongoing (see interventions/notes)  Intervention: Identify and Manage Contributors  Recent Flowsheet Documentation  Taken 03/08/2024 0845 by  Lonni FALCON, RN  Medication Review/Management: medications reviewed  Intervention: Promote Injury-Free Environment  Recent Flowsheet Documentation  Taken 03/08/2024 0845 by Lonni FALCON, RN  Safety Promotion/Fall Prevention: fall prevention program maintained     Problem: Electrolyte Imbalance  Goal: Electrolyte Balance  Outcome: Ongoing (see interventions/notes)     Problem: Skin Injury Risk Increased  Goal: Skin Health and Integrity  Outcome: Ongoing (see interventions/notes)  Intervention: Optimize Skin Protection  Recent Flowsheet Documentation  Taken 03/08/2024 1100 by Lonni FALCON, RN  Pressure Reduction Techniques: Frequent weight shifting encouraged  Pressure Reduction Devices: Pressure redistributing mattress utilized  Skin Protection: adhesive use limited  Taken 03/08/2024 0845 by Lonni FALCON, RN  Pressure Reduction Devices: Pressure redistributing mattress utilized     Problem: Nausea and Vomiting  Goal: Nausea and Vomiting Relief  Outcome: Ongoing (see interventions/notes)     Problem: Gastrointestinal Bleeding  Goal: Optimal Coping with Acute Illness  Outcome: Ongoing (see interventions/notes)  Goal: Hemostasis  Outcome: Ongoing (see interventions/notes)     Problem: Gastrointestinal Bleeding  Goal: Optimal Coping with Acute Illness  Outcome: Ongoing (see interventions/notes)  Goal: Hemostasis  Outcome: Ongoing (see interventions/notes)

## 2024-03-08 NOTE — Progress Notes (Signed)
 [facility]   Progress Note    Kelsey Preston  Date of service: 03/08/2024  Date of Admission:  03/07/2024  Hospital Day:  LOS: 1 day     Subjective:   Patient is being followed up for rectal bleeding, with blood clots.  PMH breast cancer s/p chemotherapy currently  under surveillance, DM, PE, endometrial cancer, HTN, lung cancer NSCLC s/p surgical resection 2014?, NPH    Patient seen and examined bedside. Remains hemodynamically stable, afebrile. Patient stated she is having Bright red blood per rectum. Patient f/u with Dr. Moses NSCLC s/p surgical resection and breast cancer s/p chemo. Also c/o nausea and episode of vomiting today. No other complaints. Patient has been intermittently having sinus bradycardia. Remains asymptomatic. EKG ordered which showed normal sinus rhythm. No AV block identified.       Review of Systems:    Focused review of system was completed. Refer to the HPI for ROS details.     Objective:      Vital Signs:  Vitals:    03/08/24 0206 03/08/24 0231 03/08/24 0450 03/08/24 0709   BP:  (!) 182/77 (!) 145/77 (!) 189/89   Pulse:   73 99   Resp: 18  18 18    Temp:   36.1 C (97 F) 36.1 C (97 F)   SpO2:   96% 93%   Weight:       Height:       BMI:                I/O:  I/O last 24 hours:    Intake/Output Summary (Last 24 hours) at 03/08/2024 0730  Last data filed at 03/07/2024 1700  Gross per 24 hour   Intake 200 ml   Output --   Net 200 ml     I/O current shift:  No intake/output data recorded.    Physical Exam:    Physical Exam  Cardiovascular:      Rate and Rhythm: Normal rate and regular rhythm.      Pulses: Normal pulses.   Pulmonary:      Effort: Pulmonary effort is normal. No respiratory distress.   Abdominal:      General: Abdomen is flat. There is no distension.      Tenderness: There is no abdominal tenderness.   Musculoskeletal:      Right lower leg: Edema present.      Left lower leg: No edema.   Neurological:      Mental Status: She is alert.   Psychiatric:          Mood and Affect: Mood normal.         Labs:  Results for orders placed or performed during the hospital encounter of 03/07/24 (from the past 24 hours)   CBC/DIFF    Collection Time: 03/07/24  3:14 PM    Narrative    The following orders were created for panel order CBC/DIFF.  Procedure                               Abnormality         Status                     ---------                               -----------         ------  CBC WITH DIFF[[id number]]                Abnormal            Final result                 Please view results for these tests on the individual orders.   BASIC METABOLIC PANEL    Collection Time: 03/07/24  3:14 PM   Result Value Ref Range    SODIUM 140 136 - 145 mmol/L    POTASSIUM 4.7 3.5 - 5.1 mmol/L    CHLORIDE 104 98 - 107 mmol/L    CO2 TOTAL 33 (H) 21 - 31 mmol/L    ANION GAP 3 (L) 4 - 13 mmol/L    CALCIUM 10.4 (H) 8.6 - 10.3 mg/dL    GLUCOSE 790 (H) 74 - 109 mg/dL    BUN 16 7 - 25 mg/dL    CREATININE 8.89 9.39 - 1.30 mg/dL    BUN/CREA RATIO 15 6 - 22    ESTIMATED GFR 50 (L) >59 mL/min/1.38m2    OSMOLALITY, CALCULATED 287 270 - 290 mOsm/kg    Narrative    Estimated Glomerular Filtration Rate (eGFR) is calculated using the CKD-EPI (2021) equation, intended for patients 62 years of age and older. If gender is not documented or unknown, there will be no eGFR calculation.     CBC WITH DIFF    Collection Time: 03/07/24  3:14 PM   Result Value Ref Range    WBC 3.6 (L) 3.8 - 11.8 x103/uL    RBC 3.87 3.63 - 4.92 x106/uL    HGB 10.9 10.9 - 14.3 g/dL    HCT 67.1 68.7 - 58.0 %    MCV 84.9 75.5 - 95.3 fL    MCH 28.3 24.7 - 32.8 pg    MCHC 33.3 32.3 - 35.6 g/dL    RDW 84.9 87.6 - 82.2 %    PLATELETS 126 (L) 140 - 440 x103/uL    MPV 9.1 7.9 - 10.8 fL    NEUTROPHIL % 46 43 - 77 %    LYMPHOCYTE % 42 16 - 46 %    MONOCYTE % 9 4 - 11 %    EOSINOPHIL % 2 1 - 7 %    BASOPHIL % 1 0 - 1 %    NEUTROPHIL # 1.60 (L) 1.90 - 8.20 x103/uL    LYMPHOCYTE # 1.50 1.10 - 3.10 x103/uL     MONOCYTE # 0.30 0.20 - 0.90 x103/uL    EOSINOPHIL # 0.10 0.00 - 0.50 x103/uL    BASOPHIL # 0.00 0.00 - 0.10 x103/uL   MAGNESIUM     Collection Time: 03/07/24  3:14 PM   Result Value Ref Range    MAGNESIUM  1.5 (L) 1.9 - 2.7 mg/dL   EXTRA TUBES    Collection Time: 03/07/24  3:24 PM    Narrative    The following orders were created for panel order EXTRA TUBES.  Procedure                               Abnormality         Status                     ---------                               -----------         ------  BLUE TOP ULAZ[[id number]]                                    Final result               GRAY TOP [id number]                                    Final result                 Please view results for these tests on the individual orders.   CBC    Collection Time: 03/07/24  7:11 PM   Result Value Ref Range    WBC 3.4 (L) 3.8 - 11.8 x103/uL    RBC 3.83 3.63 - 4.92 x106/uL    HGB 10.8 (L) 10.9 - 14.3 g/dL    HCT 67.7 68.7 - 58.0 %    MCV 84.2 75.5 - 95.3 fL    MCH 28.3 24.7 - 32.8 pg    MCHC 33.6 32.3 - 35.6 g/dL    RDW 85.0 87.6 - 82.2 %    PLATELETS 112 (L) 140 - 440 x103/uL    MPV 8.9 7.9 - 10.8 fL   CBC    Collection Time: 03/07/24 10:58 PM   Result Value Ref Range    WBC 3.8 3.8 - 11.8 x103/uL    RBC 3.99 3.63 - 4.92 x106/uL    HGB 11.3 10.9 - 14.3 g/dL    HCT 66.4 68.7 - 58.0 %    MCV 84.0 75.5 - 95.3 fL    MCH 28.3 24.7 - 32.8 pg    MCHC 33.7 32.3 - 35.6 g/dL    RDW 85.1 87.6 - 82.2 %    PLATELETS 110 (L) 140 - 440 x103/uL    MPV 8.7 7.9 - 10.8 fL   CBC    Collection Time: 03/08/24  3:25 AM   Result Value Ref Range    WBC 4.0 3.8 - 11.8 x103/uL    RBC 4.08 3.63 - 4.92 x106/uL    HGB 11.6 10.9 - 14.3 g/dL    HCT 65.6 68.7 - 58.0 %    MCV 84.1 75.5 - 95.3 fL    MCH 28.4 24.7 - 32.8 pg    MCHC 33.8 32.3 - 35.6 g/dL    RDW 85.1 87.6 - 82.2 %    PLATELETS 109 (L) 140 - 440 x103/uL    MPV 9.4 7.9 - 10.8 fL   COMPREHENSIVE METABOLIC PANEL, NON-FASTING    Collection Time: 03/08/24   3:25 AM   Result Value Ref Range    SODIUM 138 136 - 145 mmol/L    POTASSIUM 4.1 3.5 - 5.1 mmol/L    CHLORIDE 103 98 - 107 mmol/L    CO2 TOTAL 30 21 - 31 mmol/L    ANION GAP 5 4 - 13 mmol/L    BUN 13 7 - 25 mg/dL    CREATININE 9.06 9.39 - 1.30 mg/dL    BUN/CREA RATIO 14 6 - 22    ESTIMATED GFR 62 >59 mL/min/1.30m2    ALBUMIN 3.6 3.5 - 5.7 g/dL    CALCIUM 89.6 8.6 - 89.6 mg/dL    GLUCOSE 882 (H) 74 - 109 mg/dL    ALKALINE PHOSPHATASE 89 34 - 104 U/L    ALT (SGPT) 16 7 - 52 U/L  AST (SGOT) 27 13 - 39 U/L    BILIRUBIN TOTAL 0.8 0.3 - 1.0 mg/dL    PROTEIN TOTAL 6.3 (L) 6.4 - 8.9 g/dL    ALBUMIN/GLOBULIN RATIO 1.3 0.8 - 1.4    OSMOLALITY, CALCULATED 277 270 - 290 mOsm/kg    CALCIUM, CORRECTED 10.6 8.9 - 10.8 mg/dL    GLOBULIN 2.7 2.0 - 3.5    Narrative    Estimated Glomerular Filtration Rate (eGFR) is calculated using the CKD-EPI (2021) equation, intended for patients 20 years of age and older. If gender is not documented or unknown, there will be no eGFR calculation.     MAGNESIUM     Collection Time: 03/08/24  3:25 AM   Result Value Ref Range    MAGNESIUM  1.7 (L) 1.9 - 2.7 mg/dL            Imaging:    Results for orders placed or performed during the hospital encounter of 03/07/24 (from the past 24 hours)   CT ANGIO CHEST ABDOMEN PELVIS W IV CONTRAST     Status: Abnormal    Narrative    Shanaia Vanwagner    RADIOLOGIST: Katelyn A Ziggas    CT ANGIO CHEST ABDOMEN PELVIS W IV CONTRAST performed on 03/07/2024 5:28 PM    CLINICAL HISTORY: rectal bleeding.  rectal bleeding, hc. cholecystectomy, hysterectomy, breast cancer    TECHNIQUE: CTA imaging of the chest, abdomen, and pelvis with intravenous contrast.  Precontrast images were also performed from the thoracic inlet to the aortic bifurcation.  3D reconstructions.  IV CONTRAST: 100 ml's of Omnipaque  350    COMPARISON: PET/CT 12/23/2022. CT abdomen/pelvis 01/05/2023. CT chest 05/29/2022. Lumbar spine CT 01/27/2023.      FINDINGS:    Please note, evaluation for active GI  bleed is limited due to a lack of precontrast imaging.    Heart:  The heart is enlarged. Coronary arterial calcifications are present.   Pulmonary Vessels:  No large central filling defects.  Contrast timing was optimized for evaluation of the aorta.    Arch Vessels: Patent.  Thoracic Aorta:  No thoracic aortic aneurysm or dissection. Mild atherosclerosis.  Abdominal Aorta: No aneurysm or dissection. Atherosclerosis.    Mesenteric Arteries: Patent with atherosclerotic plaque.  Renal Arteries: Patent with atherosclerotic plaque. 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  Iliac Arteries: Patent with atherosclerotic plaque.    Other Findings:    Right chest wall port catheter tip in the SVC. No pathologically enlarged thoracic lymph nodes are identified. No pleural effusions or pneumothorax. Shallow inspiration with mild dependent atelectasis. Stable 0.7 cm groundglass right middle lobe nodule (series 904, image 97). Stable 0.5 cm left upper lobe nodule (image 50). Status left lower lobectomy.    Diffuse esophageal wall thickening. Small hiatal hernia. Unremarkable arterial phase appearance of the liver, pancreas, and right adrenal gland. Partially calcified 1.6 cm left adrenal nodule is unchanged. The spleen is top normal in size measuring 12.5 cm, not significantly changed. Left renal cyst measuring 2.2 cm. Small hypoattenuating lesions are too small to characterize but statistically favor cysts. There is mild right renal pelviectasis and proximal right hydroureter which tapers to a region of soft tissue nodularity and small calcifications (series 901, images 236-256). Findings may be secondary to adjacent ovarian vein thrombosis and phlebolith formation however urothelial neoplasm could also have this appearance. No left hydronephrosis. The urinary bladder is moderately distended and appears thin-walled. The uterus is surgically absent.    No bowel obstruction. Underdistention of  the  bowel limits evaluation of wall thickness. Curvilinear hyperdensity in the dependent portion of the gastric fundus (see series 901, images 131-156), possibly secondary to ingested material however contrast extravasation and active GI bleed cannot be excluded. Please note, evaluation is limited due to a lack of precontrast imaging.    No free intraperitoneal air or ascites.    The bones are demineralized. Status post ORIF of the proximal left femur. Chronic, severe L1 inferior endplate compression fracture with interval loss of height when compared to November 2024. There is a 0.4 bulging of the posterior superior cortex, not significantly changed.      Impression    CURVILINEAR HYPERDENSITY IN THE DEPENDENT PORTION OF THE STOMACH POSSIBLY DUE TO MATERIAL HOWEVER CONTRAST EXTRAVASATION DUE TO ACTIVE GI BLEED CAN NOT BE EXCLUDED. PLEASE NOTE, EVALUATION IS LIMITED WITHOUT PRECONTRAST IMAGING.    DIFFUSE ESOPHAGEAL WALL THICKENING. CORRELATE FOR ESOPHAGITIS. CONSIDER UPPER ENDOSCOPY.    MILD RIGHT RENAL PELVIECTASIS AND PROXIMAL RIGHT HYDROURETER WITH TAPERING TO A REGION OF SOFT TISSUE NODULARITY AND SMALL CALCIFICATIONS WHICH MAY BE SECONDARY TO ADJACENT OVARIAN VEIN THROMBOSIS AND PHLEBOLITHS HOWEVER UROTHELIAL NEOPLASM COULD ALSO HAVE THIS APPEARANCE. RECOMMEND FURTHER EVALUATION WITH NONEMERGENT CT UROGRAM.    CHRONIC, SEVERE L1 INFERIOR ENDPLATE COMPRESSION FRACTURE WITH INTERVAL LOSS OF HEIGHT WHEN COMPARED TO NOVEMBER 2024.    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    ADDITIONAL CHRONIC AND INCIDENTAL FINDINGS AS ABOVE.      These important findings were relayed via secure EPIC chat to the ordering provider Vanice Pinal, PA  on 03/07/2024 at 5:54 PM.    One or more dose reduction techniques were used (e.g., Automated exposure control, adjustment of the mA and/or kV according to patient size, use of iterative reconstruction technique).    A Critical Red actionable finding has  been sent via the [employer] Actionable Findings application on 03/07/2024 5:54 PM, Message ID [id number]. Receipt of this communication will be communicated to [facility] or responsible provider and will be documented in [employer] Actionable Findings System upon receiving the acknowledgement.      Radiologist location ID: [id number]           Microbiology:  No results found for any visits on 03/07/24 (from the past 96 hours).      Assessment/ Plan:       Active Hospital Problems    Diagnosis    Primary Problem: Rectal bleeding    Gastrointestinal hemorrhage, unspecified gastrointestinal hemorrhage type    Current use of long term anticoagulation    Bright red blood per rectum        Rectal bleeding  - CTA C/A/P - curvilinear hyperdensity in the dependent portion of the stomach possibly due to material however contrast extravasation due to active gi bleed can not be excluded. please note, evaluation is limited without precontrast imaging.   diffuse esophageal wall thickening. correlate for esophagitis. consider upper endoscopy.   mild right renal pelviectasis and proximal right hydroureter with tapering to a region of soft tissue nodularity and small calcifications which may be secondary to adjacent ovarian vein thrombosis and phleboliths however urothelial neoplasm could also have this appearance. recommend further evaluation with nonemergent ct urogram.   chronic, severe l1 inferior endplate compression fracture with interval loss of height when compared to november 2024.   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     Hypomagnesemia   - replaced     Intermittent asymptomatic sinus bradycardia  - monitor telemetry     R LE swelling  - obtain DVT scan    Any further intervention will be determined by hospital course.        DVT/PE Prophylaxis: SCD  Daily Orders (From admission, onward)      None           Anticoagulants (last 24 hours)       None             GI prophylaxis - Protonix   Consult PT/care management   Consult - GI/ hem onc       Nutrition:    DIET NPO - SPECIFIC DATE & TIME EXCEPT ALL MEDS WITH SIPS OF WATER , EXCEPT ICE CHIPS  DIET NPO - NOW EXCEPT ICE CHIPS    Additional clinical characteristics related to nutrition:    - monitor for weight changes   - monitor intake and output    - monitor bowel functions                 Lab Results   Component Value Date    ALBUMIN 3.6 03/08/2024        Disposition Planning:   Once acute issues resolve     Bedford Aurora, MD    This note was partially generated using MModal Fluency Direct system, and there may be some incorrect words, spellings, and punctuation that were not noted in checking the note before saving.

## 2024-03-09 ENCOUNTER — Inpatient Hospital Stay (HOSPITAL_COMMUNITY)

## 2024-03-09 DIAGNOSIS — Z8542 Personal history of malignant neoplasm of other parts of uterus: Secondary | ICD-10-CM

## 2024-03-09 DIAGNOSIS — D696 Thrombocytopenia, unspecified: Secondary | ICD-10-CM

## 2024-03-09 DIAGNOSIS — K625 Hemorrhage of anus and rectum: Secondary | ICD-10-CM

## 2024-03-09 DIAGNOSIS — Z7901 Long term (current) use of anticoagulants: Secondary | ICD-10-CM

## 2024-03-09 DIAGNOSIS — R9431 Abnormal electrocardiogram [ECG] [EKG]: Secondary | ICD-10-CM

## 2024-03-09 DIAGNOSIS — R001 Bradycardia, unspecified: Secondary | ICD-10-CM

## 2024-03-09 DIAGNOSIS — D649 Anemia, unspecified: Secondary | ICD-10-CM

## 2024-03-09 DIAGNOSIS — I10 Essential (primary) hypertension: Secondary | ICD-10-CM

## 2024-03-09 DIAGNOSIS — I493 Ventricular premature depolarization: Secondary | ICD-10-CM

## 2024-03-09 DIAGNOSIS — K2091 Esophagitis, unspecified with bleeding: Secondary | ICD-10-CM

## 2024-03-09 DIAGNOSIS — I82890 Acute embolism and thrombosis of other specified veins: Secondary | ICD-10-CM

## 2024-03-09 DIAGNOSIS — C9000 Multiple myeloma not having achieved remission: Secondary | ICD-10-CM

## 2024-03-09 DIAGNOSIS — Z85118 Personal history of other malignant neoplasm of bronchus and lung: Secondary | ICD-10-CM

## 2024-03-09 LAB — POC BLOOD GLUCOSE (RESULTS)
GLUCOSE, POC: 123 mg/dL — ABNORMAL HIGH (ref 70–100)
GLUCOSE, POC: 179 mg/dL — ABNORMAL HIGH (ref 70–100)

## 2024-03-09 LAB — COMPREHENSIVE METABOLIC PANEL, NON-FASTING
ALBUMIN/GLOBULIN RATIO: 1.3 (ref 0.8–1.4)
ALBUMIN: 3.4 g/dL — ABNORMAL LOW (ref 3.5–5.7)
ALKALINE PHOSPHATASE: 87 U/L (ref 34–104)
ALT (SGPT): 16 U/L (ref 7–52)
ANION GAP: 4 mmol/L (ref 4–13)
AST (SGOT): 26 U/L (ref 13–39)
BILIRUBIN TOTAL: 0.7 mg/dL (ref 0.3–1.0)
BUN/CREA RATIO: 11 (ref 6–22)
BUN: 10 mg/dL (ref 7–25)
CALCIUM, CORRECTED: 10.7 mg/dL (ref 8.9–10.8)
CALCIUM: 10.2 mg/dL (ref 8.6–10.3)
CHLORIDE: 103 mmol/L (ref 98–107)
CO2 TOTAL: 32 mmol/L — ABNORMAL HIGH (ref 21–31)
CREATININE: 0.95 mg/dL (ref 0.60–1.30)
ESTIMATED GFR: 60 mL/min/1.73mˆ2 (ref >59)
GLOBULIN: 2.6 (ref 2.0–3.5)
GLUCOSE: 123 mg/dL — ABNORMAL HIGH (ref 74–109)
OSMOLALITY, CALCULATED: 278 mosm/kg (ref 270–290)
POTASSIUM: 4.2 mmol/L (ref 3.5–5.1)
PROTEIN TOTAL: 6 g/dL — ABNORMAL LOW (ref 6.4–8.9)
SODIUM: 139 mmol/L (ref 136–145)

## 2024-03-09 LAB — CBC
HCT: 32.3 % (ref 31.2–41.9)
HGB: 11.1 g/dL (ref 10.9–14.3)
MCH: 28.7 pg (ref 24.7–32.8)
MCHC: 34.3 g/dL (ref 32.3–35.6)
MCV: 83.9 fL (ref 75.5–95.3)
MPV: 9 fL (ref 7.9–10.8)
PLATELETS: 123 x10ˆ3/uL — ABNORMAL LOW (ref 140–440)
RBC: 3.85 x10ˆ6/uL (ref 3.63–4.92)
RDW: 14.9 % (ref 12.3–17.7)
WBC: 4 x10ˆ3/uL (ref 3.8–11.8)

## 2024-03-09 LAB — PT/INR
INR: 1.17 — ABNORMAL HIGH (ref 0.84–1.10)
PROTHROMBIN TIME: 13.2 s — ABNORMAL HIGH (ref 9.8–12.7)

## 2024-03-09 LAB — PTT (PARTIAL THROMBOPLASTIN TIME): APTT: 30.1 s (ref 25.0–38.0)

## 2024-03-09 LAB — ECG 12 LEAD
Atrial Rate: 76 {beats}/min
Calculated P Axis: 42 degrees
Calculated R Axis: 0 degrees
Calculated T Axis: 28 degrees
PR Interval: 168 ms
QRS Duration: 98 ms
QT Interval: 378 ms
QTC Calculation: 425 ms
Ventricular rate: 76 {beats}/min

## 2024-03-09 LAB — MAGNESIUM: MAGNESIUM: 1.7 mg/dL — ABNORMAL LOW (ref 1.9–2.7)

## 2024-03-09 LAB — FIBRINOGEN: FIBRINOGEN: 254 mg/dL (ref 200–400)

## 2024-03-09 MED ORDER — LOSARTAN 50 MG TABLET
50.0000 mg | ORAL_TABLET | Freq: Every day | ORAL | Status: DC
  Administered 2024-03-09: 50 mg via ORAL
  Filled 2024-03-09: qty 1

## 2024-03-09 MED ORDER — LOSARTAN 50 MG TABLET: 50.0000 mg | ORAL_TABLET | Freq: Every day | ORAL | 0 refills | Status: AC

## 2024-03-09 MED ORDER — RIVAROXABAN 10 MG TABLET: 10.0000 mg | ORAL_TABLET | Freq: Every evening | ORAL | 0 refills | Status: AC

## 2024-03-09 MED ORDER — GADOBUTROL 10 MMOL/10 ML (1 MMOL/ML) INTRAVENOUS SOLUTION
10.0000 mL | INTRAVENOUS | Status: AC
  Administered 2024-03-09: 8 mL via INTRAVENOUS

## 2024-03-09 MED ORDER — APIXABAN 2.5 MG TABLET: 2.5000 mg | ORAL_TABLET | Freq: Two times a day (BID) | ORAL | 5 refills | Status: DC

## 2024-03-09 NOTE — Care Plan (Signed)
 Goal Outcome Evaluation:     Anxieties, Fears or Concerns: none voiced at this time (03/09/24 0600)  Individualized Care Needs: monitor labs and vital signs, intake and output (03/09/24 0600)  Patient-Specific Goals (Include Timeframe): discharge home when appropriate (03/09/24 0600)  Plan of Care Reviewed With: patient (03/09/24 0600)     Patient Progress: improving

## 2024-03-09 NOTE — Consults (Signed)
 Department of Hematology/Oncology   ONCOLOGY CONSULT NOTE      REFERRING PROVIDER:  Hospitalist    Reason for Consult: CT abd pelvis concerning for possible urothelial neoplasm   PCP: Manus Endo    HISTORY OF PRESENT ILLNESS:  Kelsey Preston is a 83 y.o. female who presented to the hospital for evaluation of rectal bleeding.  Hematology/oncology consultation was requested for evaluation of CT abd pelvis concerning for possible urothelial neoplasm  . Patient states yesterday morning she went to the bathroom and watery blood was expelled from rectum a decent amount of it. Did not note stool.. just blood, so she presented to the ER. Since prepping for GI w/u she has also had vomiting. Patient c/o headaches since prepping too. Blurred vision chronically and dizziness w/ prep. Patient states chronically has a problem swallowing liquids feels like they go down wrong hole into lungs, also c/o fever and chills this week.     Patient is known to us  at [facility] The patient last seen in routine f/u 03/05/24 .    Pertinent Oncology history as follows:  Previously followed by retired environmental health practitioner, Dr. Marty Kil.     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  11/25/2007 endocervical curettage: FIGO grade 1 adenocarcinoma.  12/16/2007 TAH+BSO: FIGO grade 1 adenocarcinoma.  Admitted to [facility] in 05/2022 for sudden onset chest discomfort and diaphoresis.  The patient was evaluated by the Cardiology and underwent a myocardial perfusion scan that showed a small moderate-sized reversible basal-mid inferolateral perfusion defect. Cardiology considered cardiac catheterization, however, the patient had an ultrasound which revealed the presence of an acute thrombus embolus in the left femoral vein.    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 Did not require adjuvant therapies.  Molecular  profile:  Positive: EGFR exon 19.  Negative: KRAS, MET, RET, ALK, ROS1.  Evaluated by PCP initially with CXR showing left lower lobe mass.  A subsequent CT showed a 2.1 x 2.1 x 2.2 cm left lower lobe mass with no associated adenopathy.  Pathology from lobectomy showed a 2.4 cm adenocarcinoma with focal clear cell features.  A bilateral screening mammogram on 05/10/2022 showed a 1.9 cm low axillary lymph node on the left.  An ultrasound confirmed this abnormality.  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  07/04/22 PET scan THERE IS A SOLITARY HYPERMETABOLIC LESION IN THE LEFT AXILLA, CORRESPONDING TO THE BIOPSIED LESION. IT MEASURES ABOUT 2.3 X 1.6 CM. MAX SUV IS 16.7. GIVEN OTHER FINDINGS BELOW, THIS MAY REFLECT A BREAST PRIMARY LESION. THIS STUDY DOES NOT SHOW ANY OTHER HYPERMETABOLIC ADENOPATHY. NO OTHER BREAST LESION IS SEEN ON EITHER SIDE. THIS STUDY DOESN'T SHOW ANY CONVINCING EVIDENCE FOR OTHER PRIMARY LESION. THERE IS FAIRLY INTENSE ACTIVITY ASSOCIATED WITH THE BOWEL, PARTICULARLY THE COLON, LONGITUDINAL, WITH NO PARTICULARLY FOCAL LESION. NO SIGNIFICANT PERICOLONIC INFILTRATIVE OR INFLAMMATORY CHANGES ARE SEEN. THIS IS UNLIKELY TO BE OF MALIGNANT ETIOLOGY. NO HYPERMETABOLIC LUNG, LIVER OR OTHER LIKELY METASTATIC LESION IS SEEN. THERE IS A PROBABLE BENIGN ADENOMA IN THE LEFT ADRENAL, WITH A COARSE CALCIFICATION. THIS IS NOT HYPERMETABOLIC. DENSITY IS ABOUT 7.2 HU. THIS MEASURES 2.0 X 1.6 CM. NONOBSTRUCTING RIGHT RENAL CALCULUS. PROBABLE CYSTS IN THE LEFT KIDNEY, SOME OF WHICH ARE SMALL HYPERDENSE CYSTS. NO DEFINITE HYPERMETABOLIC RENAL/URINARY TRACT LESION IS DEMONSTRATED HERE. THE CT IMAGES DON'T SHOW ANY  GROSS BLADDER LESION.  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 For some reason, We could not find an actual size measurement on the pathology report.  Multiple imaging studies of the breast were performed, but no primary lesion was found.  The malignant lesion was definitively classified as a lymph node.  Receptor staining showed that it was ER negative and HER2 Neu positive.  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  09/10/22 Per Dr. Moses progress note:  With the unknown primary location, they discussed the options of either no additional surgery and taking our chances versus radiation of the breast versus mastectomy.  I believe she is still undecided but maybe leaning towards mastectomy.  That is a decision that can be made later because we have 3 months of chemotherapy to perform 1st.  Radiation to the axillary area may be a requirement anyway due to the lymph node that was known to be involved.   09/27/22 Kanjiti, taxol  ( paclitaxel  and trastuzumab  ) adjuvently weekly  10/18/22 decrease the dose of Paclitaxel  due neutropenia w/ C2   12/27/22 Echo completed on 05/31/22 showed an Ejection Fraction of 60-65%. Her follow up Echo on 09/24/22 showed EF of 54% and Echo on 09/24/22 showed EF of 50-55%. Dr. Moses was notified of these results and he recommended holding Kinjinti today and repeating Echo prior to her next dose of Kinjinti.   01/23/23 echO: Ejection Fraction is 68.0 %. make arrangements to resume trastuzumab  adjuvant therapy   01/23/23 PET scan:  L1 VERTEBRAL BODY MILD COMPRESSION FRACTURE DEFORMITY WITH CONCAVITY OF THE INFERIOR ENDPLATE WHICH MAY BE SLIGHTLY MORE PRONOUNCED WHEN COMPARED TO 2023-01-12. SOME METABOLIC ACTIVITY WITHIN THE VERTEBRAL BODY ALONG THE INFERIOR ENDPLATE IS PRESENT AND COULD BE RELATED TO RECENT FRACTURE. PLEASE CONSIDER MRI PREFERABLY WITHOUT AND WITH IV CONTRAST FOR FURTHER AND MORE DEFINITIVE EVALUATION OF THE LUMBAR SPINE PARTICULARLY THE L1 LEVEL AND HELP COMPLETELY EXCLUDE THE  POSSIBILITY OF A PATHOLOGIC FRACTURE.  NO CURRENT SUSPICIOUS FOCAL HYPERMETABOLIC ACTIVITY INVOLVING THE SOFT TISSUES OF THE NECK, CHEST, ABDOMEN OR PELVIS TO SUGGEST MALIGNANCY-METASTASIS WHEN COMPARED TO 07/04/2022. PREVIOUSLY SEEN HYPERMETABOLIC LEFT AXILLARY LESION-LYMPH NODE ON 07/04/2022 IS NO LONGER PRESENT.  PERSISTENT IMAGING FINDINGS SUGGESTING A POSSIBLE DEGREE OF NONSPECIFIC ENTERITIS -COLITIS WHEN COMPARED TO CT FROM 01-12-23. PLEASE CORRELATE WITH CLINICAL FINDINGS AND PATIENT HISTORY.  02/18/23 signatera negative  04/23/23 Kanjinti  continues  08/29/23 continues Kanjinti   10/17/23 completes 1 year of Kanjinti   11/05/23 outside provider stopped Eliquis  5mg  po bid  11/13/23 echo: Ejection Fraction is 66.0 %.  03/07/24 CTA chest: ct abdomen/pelvis DIFFUSE ESOPHAGEAL WALL THICKENING. CORRELATE FOR ESOPHAGITIS. CONSIDER UPPER ENDOSCOPY. MILD RIGHT RENAL PELVIECTASIS AND PROXIMAL RIGHT HYDROURETER WITH TAPERING TO A REGION OF SOFT TISSUE NODULARITY AND SMALL CALCIFICATIONS WHICH MAY BE SECONDARY TO ADJACENT OVARIAN VEIN THROMBOSIS AND PHLEBOLITHS HOWEVER UROTHELIAL NEOPLASM COULD ALSO HAVE THIS APPEARANCE. 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  03/08/24 EGD: Mild distal esophagitis, LA grade A, Moderate hiatal hernia, Moderate gastritis , No ulcers or bleeding seen  03/08/24 colonoscopy 2 polyps removed (one large in rectum) as described above, with normal visualized mucosa elsewhere. Small internal hemorrhoids, grade 1    Subjective: Patient sitting up in bed no acute distress up for DC  today no further bleeding, eating and drinking w/out difficulty. Denies new complaint, family at bedside.     ROS:   Consitutional: energy level poor, appetite poor, + weight loss but has gained about 11 lbs back, + fever, + chills  HEENT: No epistaxis, +improved dysphagia chronically w/ liquids, no gum bleeding, + chronic blurred  vision  Cardiovascular: no chest pain, no palpitations, no orthopnea  Respiratory: No cough, no hemoptysis, no dyspnea, no pleurisy,m no wheezing  Gastrointestinal: +resolved nausea, + resolved vomiting, no hematemesis, +diarrhea, no constipation, + resolved melena bright red rectal bleeding on admission  Musculoskeletal: No swollen joints, no joint redness  Skin: No Rash, No nodules, no pruritus, no lesions  Neurologic: No seizures, + headache,+ dizziness,  no paresthesia  Psychiatric: no depression, no anxiety  Endocrine: no polyuria, no polydipsia    HISTORY:  Past Medical History:   Diagnosis Date    Diabetes mellitus, type 2     Embolism (CMS HCC)     left leg    Endometrial cancer     HTN (hypertension)     Hx of breast cancer     Lung cancer     Macular degeneration (senile) of retina          Past Surgical History:   Procedure Laterality Date    HIP SURGERY Left     HX CHOLECYSTECTOMY      HX COLONOSCOPY      HX HYSTERECTOMY      HX LOBECTOMY Left     LUNG CANCER SURGERY      PORTACATH PLACEMENT           Social History     Socioeconomic History    Marital status: Married     Spouse name: Not on file    Number of children: Not on file    Years of education: Not on file    Highest education level: Not on file   Occupational History    Not on file   Tobacco Use    Smoking status: Never    Smokeless tobacco: Never   Vaping Use    Vaping status: Never Used   Substance and Sexual Activity    Alcohol  use: Never    Drug use: Never    Sexual activity: Not Currently   Other Topics Concern    Not on file   Social History Narrative    Not on file     Social Determinants of Health     Financial Resource Strain: Medium Risk (01/29/2023)    Received from [employer] [facility]    Overall Financial Resource Strain (CARDIA)     Difficulty of Paying Living Expenses: Somewhat hard   Transportation Needs: No Transportation Needs (01/29/2023)    Received from [facility] - Transportation     Lack of Transportation  (Medical): No     Lack of Transportation (Non-Medical): No   Social Connections: Low Risk (03/07/2024)    Social Connections     SDOH Social Isolation: 5 or more times a week   Intimate Partner Violence: Not At Risk (01/29/2023)    Received from [employer], Afraid, Rape, and Kick questionnaire     Within the last year, have you been afraid of your partner or ex-partner?: No     Within the last year, have you been humiliated or emotionally abused in other ways by your partner or ex-partner?: No     Within the last year,  have you been kicked, hit, slapped, or otherwise physically hurt by your partner or ex-partner?: No     Within the last year, have you been raped or forced to have any kind of sexual activity by your partner or ex-partner?: No   Housing Stability: Low Risk  (01/29/2023)    Received from [facility] Stability Vital Sign     In the last 12 months, was there a time when you were not able to pay the mortgage or rent on time?: No     In the past 12 months, how many times have you moved where you were living?: 1     At any time in the past 12 months, were you homeless or living in a shelter (including now)?: No     Family Medical History:       Problem Relation (Age of Onset)    Cancer Brother            Outpatient Medications Marked as Taking for the 03/07/24 encounter [facility] Encounter)   Medication Sig    carvediloL  (COREG ) 12.5 mg Oral Tablet Take 1 Tablet (12.5 mg total) by mouth Twice daily    esomeprazole magnesium  (NEXIUM) 40 mg Oral Capsule, Delayed Release(E.C.) Take 1 Capsule (40 mg total) by mouth Every morning before breakfast    ezetimibe  (ZETIA ) 10 mg Oral Tablet Take 1 Tablet (10 mg total) by mouth Every evening    famotidine  (PEPCID ) 40 mg Oral Tablet Take 1 Tablet (40 mg total) by mouth Twice daily    gabapentin  (NEURONTIN ) 100 mg Oral Capsule Take 1 Capsule (100 mg total) by mouth Every night    metFORMIN (GLUCOPHAGE XR) 500 mg Oral Tablet Sustained  Release 24 hr Take 1 Tablet (500 mg total) by mouth Twice daily    sertraline  (ZOLOFT ) 50 mg Oral Tablet Take 1 Tablet (50 mg total) by mouth Daily    XARELTO  20 mg Oral Tablet TAKE 1 TABLET BY MOUTH EVERY EVENING WITH DINNER      Allergies[1]    PHYSICAL EXAM:    BP (!) 193/70   Pulse 70   Temp 36.8 C (98.2 F)   Resp 16   Ht 1.626 m (5' 4)   Wt 88.7 kg (195 lb 8.8 oz)   SpO2 96%   BMI 33.57 kg/m     General: Awake, alert, NAD  HEENT: Head normocephalic, atraumatic.  Mucouse membranes moist.  PERRLA, no pallor, no icterus  Adenopathy: No palpable cervical, axillary, or inguinal adenopathy noted  Neck: No JVD, Supple, no adenopathy, No JVD  Lungs: diminished to auscultation bilaterally.  No rales, rhonchi, no wheezing  Cardiovascular: Regular rate and rhythm, No murmur, no friction rub  Abdomen: Soft, Symmetric, No tenderness, No hepatosplenomegaly, BSA x4  Extremities: No peripheral edema. Warm. No calf tenderness  Skin: Skin warm and dry.  No suspicious lesions, + covered in moles large and small of varying sizes and shapes, states has seen dermatology several times and some biopsies, negative, chronic.  Neurologic: Alert and oriented x3. Cranial nerves II through XII intact. No focal paralysis or asterixis  Psych: Mood and affect congruent for age and gender, Denies SI/HI       DIAGNOSTIC DATA:       LABS:     Basic Metabolic Profile    Lab Results   Component Value Date/Time    SODIUM 139 03/09/2024 05:33 AM    POTASSIUM 4.2 03/09/2024 05:33 AM    CHLORIDE 103  03/09/2024 05:33 AM    CO2 32 (H) 03/09/2024 05:33 AM    ANIONGAP 4 03/09/2024 05:33 AM    Lab Results   Component Value Date/Time    BUN 10 03/09/2024 05:33 AM    CREATININE 0.95 03/09/2024 05:33 AM    GLUCOSENF 123 (H) 03/09/2024 05:33 AM          CBC  Diff   Lab Results   Component Value Date/Time    WBC 4.0 03/09/2024 05:33 AM    HGB 11.1 03/09/2024 05:33 AM    HCT 32.3 03/09/2024 05:33 AM    PLTCNT 123 (L) 03/09/2024 05:33 AM    RBC 3.85  03/09/2024 05:33 AM    MCV 83.9 03/09/2024 05:33 AM    MCHC 34.3 03/09/2024 05:33 AM    MCH 28.7 03/09/2024 05:33 AM    RDW 14.9 03/09/2024 05:33 AM    MPV 9.0 03/09/2024 05:33 AM    Lab Results   Component Value Date/Time    PMNS 46 03/07/2024 03:14 PM    LYMPHOCYTES 42 03/07/2024 03:14 PM    EOSINOPHIL 2 03/07/2024 03:14 PM    MONOCYTES 9 03/07/2024 03:14 PM    BASOPHILS 1 03/07/2024 03:14 PM    BASOPHILS 0.00 03/07/2024 03:14 PM    PMNABS 1.60 (L) 03/07/2024 03:14 PM    LYMPHSABS 1.50 03/07/2024 03:14 PM    EOSABS 0.10 03/07/2024 03:14 PM    MONOSABS 0.30 03/07/2024 03:14 PM    BASABS 0.04 05/30/2022 05:40 AM            ASSESSMENT:    ICD-10-CM    1. Gastrointestinal hemorrhage, unspecified gastrointestinal hemorrhage type  K92.2       2. Bright red blood per rectum  K62.5       3. Current use of long term anticoagulation  Z79.01       4. Swelling of lower leg  M79.89 PERIPHERAL VENOUS DUPLEX - LOWER        1.) Abnormal radiological findings of Diffuse esophageal wall thickening no evidence of disease on EGD 03/08/24    2.) Abnormal radiological findings: There is mild right renal pelviectasis and proximal right hydroureter which tapers to a region of soft tissue nodularity and small calcifications (series 901, images [id number]). Findings may be secondary to adjacent ovarian vein thrombosis and phlebolith formation however urothelial neoplasm could also have this appearance. No left hydronephrosis. The urinary bladder is moderately distended and appears thin-walled. The uterus is surgically absent.   Urology consult 03/08/24 reviewed imaging and felt that the study was over read, recommends urogram/cystogram    3.) T0 N1 M0, stage IIA first diagnosed 06/18/22 via L axillary mass core biopsy showing a non small-cell carcinoma of unclear origin. 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 For some reason, We could not find an actual size measurement on the pathology report.  Multiple imaging studies of the breast were performed, but no primary lesion was found.  The malignant lesion was definitively classified as a lymph node.  Receptor staining showed that it was ER negative and HER2 Neu positive.  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 Did not require adjuvant therapies.  Molecular profile:  Positive: EGFR exon 19.  Negative: KRAS, MET, RET, ALK, ROS1.  Evaluated by PCP initially with CXR showing left lower lobe mass.  A subsequent CT showed a 2.1 x 2.1 x 2.2 cm left lower lobe mass with no associated adenopathy.  Pathology from lobectomy showed a 2.4 cm adenocarcinoma with focal clear cell features.     5.) FIGO Stage I (pT1a pN0 M0 G1) adenocarcinoma of the Endometrium (multi-focal) s/p TAH+BSO.     11/25/2007 endocervical curettage: FIGO grade 1 adenocarcinoma.  12/16/2007 TAH+BSO: FIGO grade 1 adenocarcinoma.       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       7.) MGUS is considered a monoclonal proliferation of plasma cells with a higher prevalence in the elderly population and may eventually evolve into Multiple Myeloma on average estimated approximately 1% per year. Two Independent risk factors are associated with higher risk including abnormal Kappa to lamda ratio and an M-spike > 1.5 g/d.SABRA  Other risk factors include CRAB criteria including hypercalcemia, Renal insufficiency/failure, anemia, and lytic bone lesions. We would need to perform a bone marrow biopsy to differentiate between MGUS and Multiple Myeloma.     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 MRI pelvis, ovarian vein thrombosis ?? If MRI is clear of thrombosis, patient is cleared for discharge from a hematological perspective at the discretion of the medical team.  3. Continue surveillance of hx of uterine, lung, and breast cancers  4. Xarelto  on hold d/t GI bleed can be reinitiated per GI in 5 days, From a hematology perspective would recommend Eliquis  2.5mg  po bid as it has a lower GI occult bleeding risk, Price was > $400, if not affordable, low dose Xarelto  10 mg po qhs is ok as well, patient has a supply of Xarelto  at home.  5. SPEP pending  6. Copper  level pending  7. Keep regularly scheduled follow up w/ our office for surveillance  8. Further recommendations will be made following work up results     Kery Haltiwanger was given the chance to ask questions, and these were answered to their satisfaction. The patient is welcome to call with any questions or concerns in the meantime.     Thank you for this consultation and allowing us  to participate in this patients care.    Patient seen independently today, Case was discussed with Primary hematologist/oncologist Dr. DOROTHA Pipes, The above plan was advised, ordered, and implemented.   Etha Linger, APRN, CNP   03/09/2024, 10:18    CC:  Manus Endo, MD  [address]  [place] [address]    No referring provider defined for this encounter.           [1]   Allergies  Allergen Reactions    Adhesive Rash    Ceclor [Cefaclor]  Other Adverse Reaction (Add comment)     Pt states a Ceclor pill gave her blisters in her mouth.

## 2024-03-09 NOTE — Discharge Instructions (Signed)
 Work with PT and IF Blood pressure improved by afternoon  7 day Holter monitor at discharge. Follow up Dr. Sheralyn in approximately 3-4 weeks to review Holter results.   HOLD Xarelto  for 5 days and then resume at lower dose of 10 mg daily   Stop Coreg ; Start losartan  50 mg daily  Continue home Pepcid  and Nexium: high fiber diet. You will need repeat colonoscopy in 5 years  Monitor for rectal bleeding  F/u with GI Dr. Tobie and Dr. Moses, Dr. Sheralyn outpatient  F/u with urology outpatient  Return to ER if symptoms worsen

## 2024-03-09 NOTE — Care Management Notes (Signed)
 CM received a trigger to check Eliquis  from [facility] [employer] and it was $416.49 with insurance and message was sent to Dr Vincente and Kelsey Preston was notified and was making changes.

## 2024-03-09 NOTE — Consults (Signed)
 [facility]   Cardiology Consult    Kelsey Preston, 83 y.o. female  Encounter Start Date:  03/07/2024  Inpatient Admission Date: 03/07/2024   Date of Service: 03/09/2024  Date of Birth:  Sep 08, 1940  PCP:  Manus Endo, MD    Information Obtained from: patient and history reviewed via medical record  Chief Complaint:  Rectal bleeding  Reason for Consult: Bradycardia     Impression/Recommendation:  Acute GI bleed   Bradycardia  PVCs  HTN  H/O DVT  H/O breast and lung CA    Patient was on Coreg  at home which would contribute to bradycardia. Discontinue at this time. HR in the 60's this morning. Consider other antihypertensives such as ACE/ARB for BP control and titrate as needed. No indication for PPM at this time. Consider 7 day Holter monitor at discharge. Follow up Dr. Sheralyn in approximately 3-4 weeks to review Holter results.     ________________________________________________________________________________________    83 year old female presented with acute GI bleed, s/p GI work up yesterday, s/p EGD (mild distal esophagitis, moderate hiatal hernia, moderate gastritis), colonoscopy showed 2 polyps, one large one in rectum removed, small internal hemorrhoids. GI recommends to start Eliquis  after 5 days. Of note given hx of DVT, multiple cancers (breast, endometrial and lung), she is at high risk for recurrent DVT with holding anticoagulation, recommend compression socks and ambulate as much as possible while anticoag is on hold.     Cardiology was consulted for sinus bradycardia. After discontinuing coreg  her HR has improved, no high grade AV block noted, recommend 7 day event monitor at discharge to evaluate HR response off BB and f/u with Dr. Sheralyn, her primary cardiologist to discuss Holter results. I would recommend starting ARB given she is diabetic, monitor BP and add CCB if BP is not well controled (given markedly elevated BP).  She will need close f/u with PCP for BP evaluation/BMP  if ARB is started.     ECG showed NSR, no bundle branch block or first degree AV block, no high grade AV block, HR 76 BMP.  I reviewed her Echo images from yesterday, normal LV/RV size and systolic function, LA enlarged, no significant valvular disease, LVEF 60-65%.      The patient was seen by me and examined as part of a shared service with the mid-level provider. The patient was clinically evaluated, and all pertinent lab results and imaging were reviewed. I reviewed the mid-level provider note and made any substantive changes necessary. I agree with the mid-level provider note, which reflects my medical decision making.     HPI:  Kelsey Preston is a 83 y.o. female with past medical history significant for hypertension, type 2 diabetes, history of deep vein thrombosis, history of breast, endometrial and lung cancers presented to the ER on December 21st due to bright red rectal bleeding.  Patient states that the bleeding originally just started out as small amounts on 12 April when she would wipe however over the preceding few days had significantly increased to the point she was passing clots and sucking through closed.  She denied any associated nausea, vomiting or abdominal pain.  She has been experiencing some dizziness over the preceding couple days.  Denied any associated chest pain or shortness a breath.  Patient is on Xarelto  at home due to history of previous blood clots.  She does also have significant cancer history.  Patient has been noted to be bradycardic with rates in the 40s while here in  the hospital.  She has had fluctuations in the heart rate as well.  Patient is on Coreg  12.5 mg twice daily at home which has been held here in the hospital.  Cardiology was consulted for evaluation of possible sick sinus syndrome.    Assessment:  Active Hospital Problems    Diagnosis    Primary Problem: Rectal bleeding    Swelling of lower leg    Thrombocytopenia (CMS HCC)    Neutropenia    History of uterine  cancer    History of adenocarcinoma of lung    Gastrointestinal hemorrhage, unspecified gastrointestinal hemorrhage type    Current use of long term anticoagulation    Bright red blood per rectum      Past Medical History:   Diagnosis Date    Diabetes mellitus, type 2     Embolism (CMS HCC)     left leg    Endometrial cancer     HTN (hypertension)     Hx of breast cancer     Lung cancer     Macular degeneration (senile) of retina          Past Surgical History:   Procedure Laterality Date    HIP SURGERY Left     HX CHOLECYSTECTOMY      HX COLONOSCOPY      HX HYSTERECTOMY      HX LOBECTOMY Left     LUNG CANCER SURGERY      PORTACATH PLACEMENT           Social History     Socioeconomic History    Marital status: Married     Spouse name: Not on file    Number of children: Not on file    Years of education: Not on file    Highest education level: Not on file   Occupational History    Not on file   Tobacco Use    Smoking status: Never    Smokeless tobacco: Never   Vaping Use    Vaping status: Never Used   Substance and Sexual Activity    Alcohol  use: Never    Drug use: Never    Sexual activity: Not Currently   Other Topics Concern    Not on file   Social History Narrative    Not on file     Social Determinants of Health     Financial Resource Strain: Medium Risk (01/29/2023)    Received from [facility]    Overall Financial Resource Strain (CARDIA)     Difficulty of Paying Living Expenses: Somewhat hard   Transportation Needs: No Transportation Needs (01/29/2023)    Received from [facility] - Transportation     Lack of Transportation (Medical): No     Lack of Transportation (Non-Medical): No   Social Connections: Low Risk (03/07/2024)    Social Connections     SDOH Social Isolation: 5 or more times a week   Intimate Partner Violence: Not At Risk (01/29/2023)    Received from [facility]    Humiliation, Afraid, Rape, and Kick questionnaire     Within the last year, have you been afraid of your  partner or ex-partner?: No     Within the last year, have you been humiliated or emotionally abused in other ways by your partner or ex-partner?: No     Within the last year, have you been kicked, hit, slapped, or otherwise physically hurt by your partner or ex-partner?: No  Within the last year, have you been raped or forced to have any kind of sexual activity by your partner or ex-partner?: No   Housing Stability: Low Risk  (01/29/2023)    Received from [facility] Stability Vital Sign     In the last 12 months, was there a time when you were not able to pay the mortgage or rent on time?: No     In the past 12 months, how many times have you moved where you were living?: 1     At any time in the past 12 months, were you homeless or living in a shelter (including now)?: No      Family Medical History:       Problem Relation (Age of Onset)    Cancer Brother             Prior to Admission Medications:  Medications Prior to Admission       Prescriptions    atorvastatin  (LIPITOR) 40 mg Oral Tablet    Take 1 Tablet (40 mg total) by mouth Every evening for 30 days    carvediloL  (COREG ) 12.5 mg Oral Tablet    Take 1 Tablet (12.5 mg total) by mouth Twice daily    ergocalciferol, vitamin D2, (DRISDOL) 1,250 mcg (50,000 unit) Oral Capsule    Take 1 Capsule (50,000 Units total) by mouth Every 7 days    esomeprazole magnesium  (NEXIUM) 40 mg Oral Capsule, Delayed Release(E.C.)    Take 1 Capsule (40 mg total) by mouth Every morning before breakfast    ezetimibe  (ZETIA ) 10 mg Oral Tablet    Take 1 Tablet (10 mg total) by mouth Every evening    famotidine  (PEPCID ) 40 mg Oral Tablet    Take 1 Tablet (40 mg total) by mouth Twice daily    gabapentin  (NEURONTIN ) 100 mg Oral Capsule    Take 1 Capsule (100 mg total) by mouth Every night    magnesium  oxide (MAG-OX) 400 mg Oral Tablet    Take 1 Tablet (400 mg total) by mouth Twice daily    Patient taking differently:  Take 1 Tablet (400 mg total) by mouth Daily     metFORMIN (GLUCOPHAGE XR) 500 mg Oral Tablet Sustained Release 24 hr    Take 1 Tablet (500 mg total) by mouth Twice daily    multivitamin with iron Oral Tablet    Take 1 Tablet by mouth Daily    ondansetron  (ZOFRAN ) 4 mg Oral Tablet    Take 1 Tablet (4 mg total) by mouth Every 6 hours as needed for Nausea/Vomiting Indications: prevent nausea and vomiting from cancer chemotherapy    sertraline  (ZOLOFT ) 50 mg Oral Tablet    Take 1 Tablet (50 mg total) by mouth Daily    traMADoL  (ULTRAM ) 50 mg Oral Tablet    Take 1 Tablet (50 mg total) by mouth Every 4 hours as needed for Pain    vit C/E/Zn/coppr/lutein /zeaxan (PRESERVISION AREDS-2 ORAL)    Take 1 Tablet by mouth Twice daily    XARELTO  20 mg Oral Tablet    TAKE 1 TABLET BY MOUTH EVERY EVENING WITH DINNER          Allergies[1]  ROS: Other than ROS in the HPI, all other systems were negative.   Exam:  Temperature: 36.8 C (98.2 F)  Heart Rate: 70  BP (Non-Invasive): (!) 193/70  Respiratory Rate: 16  SpO2: 96 %  General: No acute distress and appears stated age.  HEENT: Head normocephalic, atraumatic.  Mucouse membranes moist.    Neck: No JVD, no carotid bruit.    Lungs: Clear to auscultation bilaterally.    Cardiovascular: Regular rate and rhythm, normal S1-S2 without murmur, gallop, or rub.    Abdomen: Soft, non-tender and bowel sounds normal.    Extremities: Extremities normal, atraumatic, no cyanosis or edema.    Skin: Skin warm and dry.    Neurologic: Alert and oriented x3.  Psych: Mood and affect congruent for age and gender     Patient seen and examined by myself and Dr. Reginal.  Assessment and plan discussed and agreed upon as documented.       Craig MARLA Glance, PA-C 03/09/2024 10:43            [1]   Allergies  Allergen Reactions    Adhesive Rash    Ceclor [Cefaclor]  Other Adverse Reaction (Add comment)     Pt states a Ceclor pill gave her blisters in her mouth.

## 2024-03-09 NOTE — Discharge Summary (Addendum)
 [facility]  DISCHARGE SUMMARY    PATIENT NAME:  Kelsey Preston, Kelsey Preston  MRN:  [id number]  DOB:  [birth year]    ENCOUNTER DATE:  03/07/2024  INPATIENT ADMISSION DATE: 03/07/2024  DISCHARGE DATE:  03/09/2024    ATTENDING PHYSICIAN: Wyatt Thorstenson, MD  SERVICE: PRN HOSPITALIST 3  PRIMARY CARE PHYSICIAN: Manus Endo, MD       No lay caregiver identified.    PRIMARY DISCHARGE DIAGNOSIS: Rectal bleeding  Active Hospital Problems    Diagnosis Date Noted    Principal Problem: Rectal bleeding [K62.5] 03/07/2024    Swelling of lower leg [M79.89] 03/08/2024    Thrombocytopenia (CMS HCC) [D69.6] 03/08/2024    Neutropenia [D70.9] 03/08/2024    History of uterine cancer [Z85.42] 03/08/2024    History of adenocarcinoma of lung [Z85.118] 03/08/2024    Gastrointestinal hemorrhage, unspecified gastrointestinal hemorrhage type [K92.2] 03/07/2024    Current use of long term anticoagulation [Z79.01] 03/07/2024    Bright red blood per rectum [K62.5] 03/07/2024      Resolved Hospital Problems   No resolved problems to display.     Active Non-Hospital Problems    Diagnosis Date Noted    Traumatic compression fracture of lumbar vertebra with routine healing 02/11/2023    Osteoporosis 02/11/2023    Acute hip pain, left 01/05/2023    Fall at home, sequela 01/05/2023    Urinary tract infection 01/05/2023    Acute left-sided low back pain without sciatica 01/05/2023    Abnormal urinalysis 01/05/2023    Gastritis, presence of bleeding unspecified, unspecified chronicity, unspecified gastritis type 01/05/2023    Hypomagnesemia 12/27/2022    Sepsis 10/07/2022    Abdominal pain 10/07/2022    Breast cancer 09/10/2022    Axillary mass, left 07/26/2022    DVT (deep venous thrombosis) 05/31/2022    Chest pain 05/30/2022    History of lung cancer 05/30/2022    Essential hypertension 05/30/2022    GERD (gastroesophageal reflux disease) 05/30/2022    DM2 (diabetes mellitus, type 2) 05/30/2022    Acute deep vein thrombosis (DVT) of femoral vein  of left lower extremity (CMS HCC) 05/30/2022             Current Discharge Medication List        START taking these medications.        Details   losartan  50 mg Tablet  Commonly known as: COZAAR   Start taking on: March 10, 2024   50 mg, Oral, Daily  Qty: 30 Tablet  Refills: 0            CONTINUE these medications which have CHANGED during your visit.        Details   rivaroxaban  10 mg Tablet  Commonly known as: Xarelto   Start taking on: March 14, 2024  What changed:   medication strength  how much to take  These instructions start on March 14, 2024. If you are unsure what to do until then, ask your doctor or other care provider.   10 mg, Oral, EVERY EVENING AFTER DINNER  Qty: 30 Tablet  Refills: 0            CONTINUE these medications - NO CHANGES were made during your visit.        Details   atorvastatin  40 mg Tablet  Commonly known as: LIPITOR   40 mg, Oral, EVERY EVENING  Qty: 30 Tablet  Refills: 0     ergocalciferol (vitamin D2) 1,250 mcg (50,000 unit) Capsule  Commonly known as: DRISDOL  50,000 Units, EVERY 7 DAYS  Refills: 0     esomeprazole magnesium  40 mg Capsule, Delayed Release(E.C.)  Commonly known as: NEXIUM   40 mg, EVERY MORNING BEFORE BREAKFAST  Refills: 0     ezetimibe  10 mg Tablet  Commonly known as: ZETIA    10 mg, EVERY EVENING  Refills: 0     famotidine  40 mg Tablet  Commonly known as: PEPCID    40 mg, Oral, 2 TIMES DAILY  Qty: 60 Tablet  Refills: 2     gabapentin  100 mg Capsule  Commonly known as: NEURONTIN    100 mg, Oral, NIGHTLY  Qty: 30 Capsule  Refills: 0     magnesium  oxide 400 mg Tablet  Commonly known as: MAG-OX   400 mg, 2 TIMES DAILY  Refills: 0     metFORMIN 500 mg Tablet Sustained Release 24 hr  Commonly known as: GLUCOPHAGE XR   500 mg, 2 TIMES DAILY  Refills: 0     multivitamin with iron Tablet   1 Tablet, Daily  Refills: 0     ondansetron  4 mg Tablet  Commonly known as: ZOFRAN    4 mg, Oral, EVERY 6 HOURS PRN  Qty: 30 Tablet  Refills: 3     PRESERVISION AREDS-2 ORAL   1  Tablet, 2 TIMES DAILY  Refills: 0     sertraline  50 mg Tablet  Commonly known as: ZOLOFT    50 mg, Daily  Refills: 0     traMADoL  50 mg Tablet  Commonly known as: ULTRAM    50 mg, Oral, EVERY 4 HOURS PRN  Qty: 20 Tablet  Refills: 1            STOP taking these medications.      carvedilol  12.5 mg Tablet  Commonly known as: COREG             Discharge med list refreshed?  YES     Allergies[1]  HOSPITAL PROCEDURE(S):   No orders of the defined types were placed in this encounter.    Surgical/Procedural Cases on this Admission       Case IDs Date Procedure Surgeon Location Status    [id number] 03/08/24 EGDCOLONOSCOPY WITH POLYPECTOMY using hot forceps and hot snare Tobie Hitt, MD PRN OR [facility]    [id number] 03/08/24 SIGMOIDOSCOPY FLEXIBLE Tobie Hitt, MD PRN OR [facility] Can          REASON FOR HOSPITALIZATION AND HOSPITAL COURSE   BRIEF HPI:  This is a 83 y.o., female admitted with PMH of breast cancer s/p chemotherapy currently under surveillance, DM, PE, endometrial cancer, HTN, lung cancer NSCLC s/p surgical resection 2014?, NPH for rectal bleeding. Patient was admitted to the hospitalist team.    BRIEF HOSPITAL NARRATIVE:       Patient was managed for the following -     Rectal bleeding secondary to rectal polyp  Esphagitis, gastritis   : s/p EGD and flex sig on 12/22: patient had 2 polyps removed. Patient to hold Xarelto  for 5 days. Patient to f/u with GI outpatient   - CTA C/A/P - curvilinear hyperdensity in the dependent portion of the stomach possibly due to material however contrast extravasation due to active gi bleed can not be excluded. please note, evaluation is limited without precontrast imaging.   diffuse esophageal wall thickening. correlate for esophagitis. consider upper endoscopy.   mild right renal pelviectasis and proximal right hydroureter with tapering to a region of soft tissue nodularity and small calcifications which may be secondary to adjacent ovarian vein  thrombosis and phleboliths however  urothelial neoplasm could also have this appearance. recommend further evaluation with nonemergent ct urogram.   chronic, severe l1 inferior endplate compression fracture with interval loss of height when compared to november 2024.   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  S/p EGD - Impression:  Mild distal esophagitis, LA grade A  Moderate hiatal hernia  Moderate gastritis   No ulcers or bleeding seen     Recommendations:  Continue Pantoprazole  40 mg po q day, before dinner (Nexium at home)  Continue Famotidine  40 mg po bid  Maintain antireflux precautions   S/p flex sig - Impressions:  2 polyps removed (one large in rectum) as described above, with normal visualized mucosa elsewhere.  Small internal hemorrhoids, grade 1     Recommendations:  Await results of biopsies  High fiber diet  Repeat colonoscopy in 5 years due to personal history of colon polyps  Hold Xarelto  for 5 days, then resume    Intermittent bradycardia: asymptomatic   - stopped coreg   - started on losartan  for hx of HTN  - cardiology recommended Holter monitor for 7 days with results to primary cardiologist and f/u with primary cardiologist     R/o urothelial neoplasm based on CT findings  - urology consulted :   - needs outpatient f/u with urology for cystoscopy  - per urology low risk of urothelial ca      Essential HTN  - stop coreg  for bradycardia  - start losartan  50 mg daily   - f/u with PCP/cardiology outpatient      HLD  - resumed Lipitor 40 mg daily   - resumed Zetia  10 mg daily      Hx of PE?DVT  - R LE DVT neg for DVT  - Xarelto  decreased to 10 mg daily per hem/onc recs as patient had negative DVT scan in past and R LE DVT scan remains neg     DM type II -   - continue for home regimen      Hypomagnesemia   - replaced      12/23  Patient seen and examined bedside. Doing well. Denied any complaints. Discussed discharge instructions with daughter in law at bedside.  Physical Exam  Cardiovascular:       Rate and Rhythm: Normal rate and regular rhythm.      Pulses: Normal pulses.   Pulmonary:      Effort: Pulmonary effort is normal. No respiratory distress.      Breath sounds: No wheezing.   Abdominal:      General: Abdomen is flat. There is no distension.      Tenderness: There is no abdominal tenderness.   Musculoskeletal:      Right lower leg: Edema present.      Left lower leg: No edema.      Comments: RLE trace edema   Neurological:      Mental Status: She is alert.           TRANSITION/POST DISCHARGE CARE/PENDING TESTS/REFERRALS:   Work with PT and IF Blood pressure improved by afternoon  7 day Holter monitor at discharge. Follow up Dr. Sheralyn in approximately 3-4 weeks to review Holter results.   HOLD Xarelto  for 5 days and then resume at lower dose of 10 mg daily   Stop Coreg ; Start losartan  50 mg daily  Continue home Pepcid  and Nexium: high fiber diet. You will need repeat colonoscopy in 5 years  Monitor for rectal  bleeding  F/u with GI Dr. Tobie and Dr. Moses, Dr. Sheralyn outpatient  F/u with urology outpatient  Return to ER if symptoms worsen    CONDITION ON DISCHARGE:  A. Ambulation: Full ambulation  B. Self-care Ability: Complete  C. Cognitive Status Alert and Oriented x 3  D. Code status at discharge:       LINES/DRAINS/WOUNDS AT DISCHARGE:   Patient Lines/Drains/Airways Status       Active Line / Dialysis Catheter / Dialysis Graft / Drain / Airway / Wound       Name Placement date Placement time Site Days    Peripheral IV Anterior;Left 03/07/24  1704  -- 1                    DISCHARGE DISPOSITION:  Home discharge  DISCHARGE INSTRUCTIONS:  Post-Discharge Follow Up Appointments       Friday Mar 19, 2024    Infusion with CHAIR 04 - 221A, ONCOLOGY INFUSION PRN at 11:30 AM      Friday Mar 26, 2024    Return Patient Visit with Cherylene Pac, MD at  1:00 PM      Wednesday Mar 31, 2024    Infusion with CHAIR 09 - 224, ONCOLOGY INFUSION PRN at 11:00 AM      Friday Sep 03, 2024    Infusion with CHAIR 07 - 222B,  ONCOLOGY INFUSION PRN at  2:30 PM      Hematology/Oncology, [facility] [place]   [address]  [place] [address]  [phone] Radiation Oncology, [facility]  [facility], [place]  [address]  [place] [address]  [phone]          No discharge procedures on file.       Bedford Aurora, MD    Copies sent to Care Team         Relationship Specialty Notifications Start End    Andra Pastor, MD PCP - General FAMILY MEDICINE  10/17/23     Phone: [phone] Fax: [phone]         [address] [place] [address]    Marget Grate, RN Oncology Nurse Navigator  Yes. All abnormal results, Admissions 09/11/22     Moses Agent, MD  HEMATOLOGY-ONCOLOGY  10/11/22     Phone: [phone] Fax: [phone]         [address] [place] [address]    Sherrilee Sora, APRN, CNP  FAMILY NURSE PRACTITIONER Admissions 07/18/23     Phone: [phone] Fax: [phone]         [address] [place] [address] [facility] [place] [address]    Sanda Hastings, LPN   Admissions 11/07/23             Referring providers can utilize [email or web address] to access their referred [facility] patient's information.                               [1]   Allergies  Allergen Reactions    Adhesive Rash    Ceclor [Cefaclor]  Other Adverse Reaction (Add comment)     Pt states a Ceclor pill gave her blisters in her mouth.

## 2024-03-09 NOTE — Care Plan (Signed)
 Goal Outcome Evaluation:     Anxieties, Fears or Concerns: none voiced at this time (03/09/24 0600)  Individualized Care Needs: monitor labs and vital signs, intake and output (03/09/24 0600)  Patient-Specific Goals (Include Timeframe): discharge home when appropriate (03/09/24 0600)  Plan of Care Reviewed With: patient (03/09/24 0600)     Patient Progress: improving          Problem: Adult Inpatient Plan of Care  Goal: Absence of Hospital-Acquired Illness or Injury  03/09/2024 0601 by Chiquita SAILOR, RN  Outcome: Ongoing (see interventions/notes)  03/09/2024 0601 by Chiquita SAILOR, RN  Outcome: Ongoing (see interventions/notes)  Goal: Optimal Comfort and Wellbeing  03/09/2024 0601 by Chiquita SAILOR, RN  Outcome: Ongoing (see interventions/notes)  03/09/2024 0601 by Chiquita SAILOR, RN  Outcome: Ongoing (see interventions/notes)  Goal: Rounds/Family Conference  03/09/2024 0601 by Chiquita SAILOR, RN  Outcome: Ongoing (see interventions/notes)  03/09/2024 0601 by Chiquita SAILOR, RN  Outcome: Ongoing (see interventions/notes)     Problem: Fall Injury Risk  Goal: Absence of Fall and Fall-Related Injury  03/09/2024 0601 by Chiquita SAILOR, RN  Outcome: Ongoing (see interventions/notes)  03/09/2024 0601 by Chiquita SAILOR, RN  Outcome: Ongoing (see interventions/notes)     Problem: Electrolyte Imbalance  Goal: Electrolyte Balance  03/09/2024 0601 by Chiquita SAILOR, RN  Outcome: Ongoing (see interventions/notes)  03/09/2024 0601 by Chiquita SAILOR, RN  Outcome: Ongoing (see interventions/notes)     Problem: Skin Injury Risk Increased  Goal: Skin Health and Integrity  03/09/2024 0601 by Chiquita SAILOR, RN  Outcome: Ongoing (see interventions/notes)  03/09/2024 0601 by Chiquita SAILOR, RN  Outcome: Ongoing (see interventions/notes)     Problem: Nausea and Vomiting  Goal: Nausea and Vomiting Relief  03/09/2024 0601 by Chiquita SAILOR, RN  Outcome: Ongoing (see interventions/notes)  03/09/2024 0601 by Chiquita SAILOR, RN  Outcome: Ongoing (see interventions/notes)     Problem: Gastrointestinal Bleeding  Goal:  Optimal Coping with Acute Illness  03/09/2024 0601 by Chiquita SAILOR, RN  Outcome: Ongoing (see interventions/notes)  03/09/2024 0601 by Chiquita SAILOR, RN  Outcome: Ongoing (see interventions/notes)  Goal: Hemostasis  03/09/2024 0601 by Chiquita SAILOR, RN  Outcome: Ongoing (see interventions/notes)  03/09/2024 0601 by Chiquita SAILOR, RN  Outcome: Ongoing (see interventions/notes)     Problem: Gastrointestinal Bleeding  Goal: Optimal Coping with Acute Illness  03/09/2024 0601 by Chiquita SAILOR, RN  Outcome: Ongoing (see interventions/notes)  03/09/2024 0601 by Chiquita SAILOR, RN  Outcome: Ongoing (see interventions/notes)  Goal: Hemostasis  03/09/2024 0601 by Chiquita SAILOR, RN  Outcome: Ongoing (see interventions/notes)  03/09/2024 0601 by Chiquita SAILOR, RN  Outcome: Ongoing (see interventions/notes)

## 2024-03-09 NOTE — Consults (Signed)
 [facility]  Gastroenterology/ Hepatology Consult Note      Patient: Kelsey Preston, Kelsey Preston, 83 y.o. female  Date of Birth: June 03, 1940  Admission Date: 03/07/2024  PCP: Manus Endo, MD    History of Present Illness:    Resting in bed. Denies abdominal pain, nausea or vomiting. Hoping to go home today. Denies heartburn or acid reflux.   No BM today. Family at bedside.     Review of Systems   Constitutional: Negative for fever.     As per HPI    Historical Data   Medications Prior to Admission       Prescriptions    atorvastatin  (LIPITOR) 40 mg Oral Tablet    Take 1 Tablet (40 mg total) by mouth Every evening for 30 days    carvediloL  (COREG ) 12.5 mg Oral Tablet    Take 1 Tablet (12.5 mg total) by mouth Twice daily    ergocalciferol, vitamin D2, (DRISDOL) 1,250 mcg (50,000 unit) Oral Capsule    Take 1 Capsule (50,000 Units total) by mouth Every 7 days    esomeprazole magnesium  (NEXIUM) 40 mg Oral Capsule, Delayed Release(E.C.)    Take 1 Capsule (40 mg total) by mouth Every morning before breakfast    ezetimibe  (ZETIA ) 10 mg Oral Tablet    Take 1 Tablet (10 mg total) by mouth Every evening    famotidine  (PEPCID ) 40 mg Oral Tablet    Take 1 Tablet (40 mg total) by mouth Twice daily    gabapentin  (NEURONTIN ) 100 mg Oral Capsule    Take 1 Capsule (100 mg total) by mouth Every night    magnesium  oxide (MAG-OX) 400 mg Oral Tablet    Take 1 Tablet (400 mg total) by mouth Twice daily    Patient taking differently:  Take 1 Tablet (400 mg total) by mouth Daily    metFORMIN (GLUCOPHAGE XR) 500 mg Oral Tablet Sustained Release 24 hr    Take 1 Tablet (500 mg total) by mouth Twice daily    multivitamin with iron Oral Tablet    Take 1 Tablet by mouth Daily    ondansetron  (ZOFRAN ) 4 mg Oral Tablet    Take 1 Tablet (4 mg total) by mouth Every 6 hours as needed for Nausea/Vomiting Indications: prevent nausea and vomiting from cancer chemotherapy    sertraline  (ZOLOFT ) 50 mg Oral Tablet    Take 1 Tablet (50 mg total) by  mouth Daily    traMADoL  (ULTRAM ) 50 mg Oral Tablet    Take 1 Tablet (50 mg total) by mouth Every 4 hours as needed for Pain    vit C/E/Zn/coppr/lutein /zeaxan (PRESERVISION AREDS-2 ORAL)    Take 1 Tablet by mouth Twice daily    XARELTO  20 mg Oral Tablet    TAKE 1 TABLET BY MOUTH EVERY EVENING WITH DINNER          Allergies[1]       Vitals:  Temperature: 36.5 C (97.7 F)  Heart Rate: 65  Respiratory Rate: 18  BP (Non-Invasive): (!) 162/77  SpO2: 91 %    Objective:  General Appearance:  Comfortable, well-appearing and in no acute distress.    Vital signs: (most recent): Blood pressure (!) 162/77, pulse 65, temperature 36.5 C (97.7 F), resp. rate 18, height 1.626 m (5' 4), weight 88.7 kg (195 lb 8.8 oz), SpO2 91%.  Vital signs are normal.  No fever.    Lungs:  Normal effort and normal respiratory rate.  Breath sounds clear to auscultation.    Heart: Normal  rate.  Regular rhythm.  S1 normal and S2 normal.    Abdomen: Abdomen is soft.  Bowel sounds are normal.   There is no abdominal tenderness.     Extremities: Normal range of motion.    Pulses: Distal pulses are intact.    Neurological: Patient is alert and oriented to person, place and time.    Skin:  Warm and dry.        Active Hospital Problems   (*Primary Problem)    Diagnosis    *Rectal bleeding    Swelling of lower leg    Thrombocytopenia (CMS HCC)    Neutropenia    History of uterine cancer    History of adenocarcinoma of lung    Gastrointestinal hemorrhage, unspecified gastrointestinal hemorrhage type    Current use of long term anticoagulation    Bright red blood per rectum       Labs:     Results for orders placed or performed during the hospital encounter of 03/07/24 (from the past 24 hours)   POC BLOOD GLUCOSE (RESULTS)   Result Value Ref Range    GLUCOSE, POC 111 (H) 70 - 100 mg/dl   POC BLOOD GLUCOSE (RESULTS)   Result Value Ref Range    GLUCOSE, POC 167 (H) 70 - 100 mg/dl   PT/INR   Result Value Ref Range    PROTHROMBIN TIME 13.2 (H) 9.8 - 12.7  seconds    INR 1.17 (H) 0.84 - 1.10   PTT (PARTIAL THROMBOPLASTIN TIME)   Result Value Ref Range    APTT 30.1 25.0 - 38.0 seconds   FIBRINOGEN    Result Value Ref Range    FIBRINOGEN  254 200 - 400 mg/dL   COMPREHENSIVE METABOLIC PANEL, NON-FASTING   Result Value Ref Range    SODIUM 139 136 - 145 mmol/L    POTASSIUM 4.2 3.5 - 5.1 mmol/L    CHLORIDE 103 98 - 107 mmol/L    CO2 TOTAL 32 (H) 21 - 31 mmol/L    ANION GAP 4 4 - 13 mmol/L    BUN 10 7 - 25 mg/dL    CREATININE 9.04 9.39 - 1.30 mg/dL    BUN/CREA RATIO 11 6 - 22    ESTIMATED GFR 60 >59 mL/min/1.73m2    ALBUMIN 3.4 (L) 3.5 - 5.7 g/dL    CALCIUM 89.7 8.6 - 89.6 mg/dL    GLUCOSE 876 (H) 74 - 109 mg/dL    ALKALINE PHOSPHATASE 87 34 - 104 U/L    ALT (SGPT) 16 7 - 52 U/L    AST (SGOT) 26 13 - 39 U/L    BILIRUBIN TOTAL 0.7 0.3 - 1.0 mg/dL    PROTEIN TOTAL 6.0 (L) 6.4 - 8.9 g/dL    ALBUMIN/GLOBULIN RATIO 1.3 0.8 - 1.4    OSMOLALITY, CALCULATED 278 270 - 290 mOsm/kg    CALCIUM, CORRECTED 10.7 8.9 - 10.8 mg/dL    GLOBULIN 2.6 2.0 - 3.5   CBC   Result Value Ref Range    WBC 4.0 3.8 - 11.8 x103/uL    RBC 3.85 3.63 - 4.92 x106/uL    HGB 11.1 10.9 - 14.3 g/dL    HCT 67.6 68.7 - 58.0 %    MCV 83.9 75.5 - 95.3 fL    MCH 28.7 24.7 - 32.8 pg    MCHC 34.3 32.3 - 35.6 g/dL    RDW 85.0 87.6 - 82.2 %    PLATELETS 123 (L) 140 - 440 x103/uL    MPV 9.0  7.9 - 10.8 fL   MAGNESIUM    Result Value Ref Range    MAGNESIUM  1.7 (L) 1.9 - 2.7 mg/dL   POC BLOOD GLUCOSE (RESULTS)   Result Value Ref Range    GLUCOSE, POC 123 (H) 70 - 100 mg/dl   POC BLOOD GLUCOSE (RESULTS)   Result Value Ref Range    GLUCOSE, POC 179 (H) 70 - 100 mg/dl       Imaging Studies:    Results for orders placed or performed during the hospital encounter of 03/07/24   CT ANGIO CHEST ABDOMEN PELVIS W IV CONTRAST     Status: Abnormal    Narrative    Alaura Titterington    RADIOLOGIST: Katelyn A Ziggas    CT ANGIO CHEST ABDOMEN PELVIS W IV CONTRAST performed on 03/07/2024 5:28 PM    CLINICAL HISTORY: rectal bleeding.  rectal  bleeding, hc. cholecystectomy, hysterectomy, breast cancer    TECHNIQUE: CTA imaging of the chest, abdomen, and pelvis with intravenous contrast.  Precontrast images were also performed from the thoracic inlet to the aortic bifurcation.  3D reconstructions.  IV CONTRAST: 100 ml's of Omnipaque  350    COMPARISON: PET/CT 12/23/2022. CT abdomen/pelvis 01/05/2023. CT chest 05/29/2022. Lumbar spine CT 01/27/2023.      FINDINGS:    Please note, evaluation for active GI bleed is limited due to a lack of precontrast imaging.    Heart:  The heart is enlarged. Coronary arterial calcifications are present.   Pulmonary Vessels:  No large central filling defects.  Contrast timing was optimized for evaluation of the aorta.    Arch Vessels: Patent.  Thoracic Aorta:  No thoracic aortic aneurysm or dissection. Mild atherosclerosis.  Abdominal Aorta: No aneurysm or dissection. Atherosclerosis.    Mesenteric Arteries: Patent with atherosclerotic plaque.  Renal Arteries: Patent with atherosclerotic plaque. 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  Iliac Arteries: Patent with atherosclerotic plaque.    Other Findings:    Right chest wall port catheter tip in the SVC. No pathologically enlarged thoracic lymph nodes are identified. No pleural effusions or pneumothorax. Shallow inspiration with mild dependent atelectasis. Stable 0.7 cm groundglass right middle lobe nodule (series 904, image 97). Stable 0.5 cm left upper lobe nodule (image 50). Status left lower lobectomy.    Diffuse esophageal wall thickening. Small hiatal hernia. Unremarkable arterial phase appearance of the liver, pancreas, and right adrenal gland. Partially calcified 1.6 cm left adrenal nodule is unchanged. The spleen is top normal in size measuring 12.5 cm, not significantly changed. Left renal cyst measuring 2.2 cm. Small hypoattenuating lesions are too small to characterize but statistically favor cysts. There is mild right renal  pelviectasis and proximal right hydroureter which tapers to a region of soft tissue nodularity and small calcifications (series 901, images 236-256). Findings may be secondary to adjacent ovarian vein thrombosis and phlebolith formation however urothelial neoplasm could also have this appearance. No left hydronephrosis. The urinary bladder is moderately distended and appears thin-walled. The uterus is surgically absent.    No bowel obstruction. Underdistention of the bowel limits evaluation of wall thickness. Curvilinear hyperdensity in the dependent portion of the gastric fundus (see series 901, images 131-156), possibly secondary to ingested material however contrast extravasation and active GI bleed cannot be excluded. Please note, evaluation is limited due to a lack of precontrast imaging.    No free intraperitoneal air or ascites.    The bones are demineralized. Status post ORIF of the proximal left femur. Chronic, severe  L1 inferior endplate compression fracture with interval loss of height when compared to November 2024. There is a 0.4 bulging of the posterior superior cortex, not significantly changed.      Impression    CURVILINEAR HYPERDENSITY IN THE DEPENDENT PORTION OF THE STOMACH POSSIBLY DUE TO MATERIAL HOWEVER CONTRAST EXTRAVASATION DUE TO ACTIVE GI BLEED CAN NOT BE EXCLUDED. PLEASE NOTE, EVALUATION IS LIMITED WITHOUT PRECONTRAST IMAGING.    DIFFUSE ESOPHAGEAL WALL THICKENING. CORRELATE FOR ESOPHAGITIS. CONSIDER UPPER ENDOSCOPY.    MILD RIGHT RENAL PELVIECTASIS AND PROXIMAL RIGHT HYDROURETER WITH TAPERING TO A REGION OF SOFT TISSUE NODULARITY AND SMALL CALCIFICATIONS WHICH MAY BE SECONDARY TO ADJACENT OVARIAN VEIN THROMBOSIS AND PHLEBOLITHS HOWEVER UROTHELIAL NEOPLASM COULD ALSO HAVE THIS APPEARANCE. RECOMMEND FURTHER EVALUATION WITH NONEMERGENT CT UROGRAM.    CHRONIC, SEVERE L1 INFERIOR ENDPLATE COMPRESSION FRACTURE WITH INTERVAL LOSS OF HEIGHT WHEN COMPARED TO NOVEMBER 2024.    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    ADDITIONAL CHRONIC AND INCIDENTAL FINDINGS AS ABOVE.      These important findings were relayed via secure EPIC chat to the ordering provider Vanice Pinal, PA  on 03/07/2024 at 5:54 PM.    One or more dose reduction techniques were used (e.g., Automated exposure control, adjustment of the mA and/or kV according to patient size, use of iterative reconstruction technique).    A Critical Red actionable finding has been sent via the [employer] Actionable Findings application on 03/07/2024 5:54 PM, Message ID [id number]. Receipt of this communication will be communicated to [facility] or responsible provider and will be documented in [employer] Actionable Findings System upon receiving the acknowledgement.      Radiologist location ID: [id number]               Assessment/Plan:    GI bleeding  Large colon polyp    Continue current management and supportive care. EGD yesterday showed mild distal esophagitis, moderate hiatal hernia, moderate gastritis. Colonoscopy showed 2 polyps - one large one in rectum removed, small internal hemorrhoids. Needs repeat colonoscopy in 5 years. Hold Eliquis  for 5 days and then resume. FU in office in 2-3 weeks for biopsy results.   Will continue to follow. Further recommendations based on clinical course. Patient discussed in detail with Dr MARLA Blanch and treatment plan decided by him.     Suzen Jenkins Signs, APRN, CNP        [1]   Allergies  Allergen Reactions    Adhesive Rash    Ceclor [Cefaclor]  Other Adverse Reaction (Add comment)     Pt states a Ceclor pill gave her blisters in her mouth.

## 2024-03-09 NOTE — Care Plan (Signed)
 Problem: Adult Inpatient Plan of Care  Goal: Absence of Hospital-Acquired Illness or Injury  Outcome: Ongoing (see interventions/notes)  Intervention: Identify and Manage Fall Risk  Recent Flowsheet Documentation  Taken 03/09/2024 0822 by Glade SAILOR, RN  Safety Promotion/Fall Prevention: activity supervised  Intervention: Prevent Infection  Recent Flowsheet Documentation  Taken 03/09/2024 9177 by Glade SAILOR, RN  Infection Prevention: single patient room provided  Goal: Optimal Comfort and Wellbeing  Outcome: Ongoing (see interventions/notes)  Goal: Rounds/Family Conference  Outcome: Ongoing (see interventions/notes)     Problem: Fall Injury Risk  Goal: Absence of Fall and Fall-Related Injury  Outcome: Ongoing (see interventions/notes)  Intervention: Identify and Manage Contributors  Recent Flowsheet Documentation  Taken 03/09/2024 0822 by Glade SAILOR, RN  Medication Review/Management: medications reviewed  Intervention: Promote Injury-Free Environment  Recent Flowsheet Documentation  Taken 03/09/2024 9177 by Glade SAILOR, RN  Safety Promotion/Fall Prevention: activity supervised     Problem: Electrolyte Imbalance  Goal: Electrolyte Balance  Outcome: Ongoing (see interventions/notes)     Problem: Skin Injury Risk Increased  Goal: Skin Health and Integrity  Outcome: Ongoing (see interventions/notes)     Problem: Nausea and Vomiting  Goal: Nausea and Vomiting Relief  Outcome: Ongoing (see interventions/notes)     Problem: Gastrointestinal Bleeding  Goal: Optimal Coping with Acute Illness  Outcome: Ongoing (see interventions/notes)  Goal: Hemostasis  Outcome: Ongoing (see interventions/notes)     Problem: Gastrointestinal Bleeding  Goal: Optimal Coping with Acute Illness  Outcome: Ongoing (see interventions/notes)  Goal: Hemostasis  Outcome: Ongoing (see interventions/notes)

## 2024-03-09 NOTE — Care Management Notes (Signed)
 [facility]  Care Management Initial Evaluation    Patient Name: Kelsey Preston  Date of Birth: 08-12-1940  Sex: female  Date/Time of Admission: 03/07/2024  2:41 PM  Room/Bed: 427/A  Payor: MEDICARE / Plan: MEDICARE PART A AND B / Product Type: Medicare /   Primary Care Providers:  Andra Pastor, MD, MD (General)    Pharmacy Info:   Preferred Pharmacy       [facility] - [place], [place] - [address]    [address] [place] [address]    Phone: [phone] Fax: [phone]    Hours: Not open 24 hours    [facility] [address], [place] - [address]    [address] [place] [address]    Phone: [phone] Fax: [phone]    Hours: Not open 24 hours          Emergency Contact Info:   Extended Emergency Contact Information  Primary Emergency Contact: [facility]  Address: [address]           [place], [place] [address] [place]  Home Phone: [phone]  Mobile Phone: [phone]  Relation: Husband  Interpreter needed? No  Secondary Emergency Contact: SNIDER,DONNA  Mobile Phone: [phone]  Relation: Daughter  Preferred language: English  Interpreter needed? No    History:   Kelsey Preston is a 83 y.o., female, admitted 03/07/2024    Height/Weight: 162.6 cm (5' 4) / 88.7 kg (195 lb 8.8 oz)     LOS: 2 days   Admitting Diagnosis: Rectal bleeding [K62.5]    Assessment:      03/09/24 9077   Assessment Details   Assessment Type Admission   Date of Care Management Update 03/09/24   Insurance Information/Type   Insurance type Medicare   Employment/Financial   Patient has Prescription Coverage?  Yes   Financial/Environmental Concerns none   Living Environment   Lives With spouse  Kelsey Preston)   Living Arrangements house   Able to Return to Prior Arrangements yes   Home Safety   Home Assessment: No Problems Identified   Home Accessibility bed and bath on same level;grab bars present (bathtub);stairs to enter home   Care  Management Plan   Discharge Planning Status initial meeting   Discharge plan discussed with: Patient   CM will evaluate for rehabilitation potential yes   Patient choice offered to patient/family Yes   Discharge Needs Assessment   Equipment Currently Used at Home walker, standard;cane, straight   Equipment Needed After Discharge none   Discharge Facility/Level of Care Needs Home (Patient/Family Member/other)(code 1)   Transportation Available car   Transportation Arranged None   Discharge Information   Discharge Disposition home or self-care   Referral Information   Admission Type inpatient   Home Main Entrance   Number of Stairs, Main Entrance four         Discharge Plan:  Home (Patient/Family Member/other) (code 1)  CM in to speak to patient about DC needs. Patient states she lives with her husband Kelsey Preston and daughter. Patient lives in a house with 4 steps to enter and bed and bath on same level, has basement but never goes down there. Patient daughter and husband takes patient to her Dr appointments. Patient sees Dr andra in [place]. [facility]. Patient states she has a walker and cane at home. Patient states she has a handicap bathroom. Patient states she does not need any  equipment. Patients goal is to go home. Patient states she has not had any home health and does not feel she needs any.     The patient will continue to be evaluated for developing discharge needs.     Case Manager: Delon Pizza, CASE MANAGER  Phone: [phone]

## 2024-03-10 ENCOUNTER — Other Ambulatory Visit (HOSPITAL_COMMUNITY): Payer: Self-pay | Admitting: HOSPITALIST

## 2024-03-10 ENCOUNTER — Ambulatory Visit (HOSPITAL_COMMUNITY): Payer: Self-pay

## 2024-03-10 DIAGNOSIS — R001 Bradycardia, unspecified: Secondary | ICD-10-CM

## 2024-03-10 LAB — KAPPA AND LAMBDA FREE LIGHT CHAINS, SERUM
KAPPA FREE LIGHT CHAINS: 4.4 mg/dL — ABNORMAL HIGH (ref 1.25–3.25)
KAPPA/LAMBDA FLC RATIO: 1.5 (ref 0.80–2.10)
LAMBDA FREE LIGHT CHAINS: 2.93 mg/dL — ABNORMAL HIGH (ref 0.60–2.70)

## 2024-03-10 LAB — ALBUMIN FOR ELECTROPHORESIS: ALBUMIN: 3 g/dL — ABNORMAL LOW (ref 3.4–4.8)

## 2024-03-10 LAB — PROTEIN FOR ELECTROPHORESIS: PROTEIN TOTAL: 5.5 g/dL — ABNORMAL LOW (ref 5.6–7.6)

## 2024-03-10 LAB — COPPER, SERUM: COPPER: 82 ug/dL (ref 70–175)

## 2024-03-12 ENCOUNTER — Ambulatory Visit
Admission: RE | Admit: 2024-03-12 | Discharge: 2024-03-12 | Disposition: A | Payer: Self-pay | Source: Ambulatory Visit | Attending: HOSPITALIST

## 2024-03-12 ENCOUNTER — Other Ambulatory Visit: Payer: Self-pay

## 2024-03-12 DIAGNOSIS — D126 Benign neoplasm of colon, unspecified: Secondary | ICD-10-CM

## 2024-03-12 DIAGNOSIS — R001 Bradycardia, unspecified: Secondary | ICD-10-CM | POA: Insufficient documentation

## 2024-03-12 LAB — SURGICAL PATHOLOGY SPECIMEN

## 2024-03-15 LAB — IMMUNOSUBTRACTION, SERUM: PATHOLOGIST INTERPRETATION (IMMUNOTYPING): ABNORMAL — AB

## 2024-03-16 ENCOUNTER — Other Ambulatory Visit: Payer: Self-pay

## 2024-03-16 ENCOUNTER — Ambulatory Visit
Admission: RE | Admit: 2024-03-16 | Discharge: 2024-03-16 | Disposition: A | Discharge status: Home / Self Care | Source: Ambulatory Visit | Attending: HEMATOLOGY-ONCOLOGY | Admitting: HEMATOLOGY-ONCOLOGY

## 2024-03-16 ENCOUNTER — Ambulatory Visit (INDEPENDENT_AMBULATORY_CARE_PROVIDER_SITE_OTHER): Admission: RE | Admit: 2024-03-16 | Discharge: 2024-03-16 | Payer: Self-pay | Attending: HEMATOLOGY-ONCOLOGY

## 2024-03-16 ENCOUNTER — Ambulatory Visit (INDEPENDENT_AMBULATORY_CARE_PROVIDER_SITE_OTHER): Payer: Self-pay | Admitting: HEMATOLOGY-ONCOLOGY

## 2024-03-16 ENCOUNTER — Encounter (INDEPENDENT_AMBULATORY_CARE_PROVIDER_SITE_OTHER): Payer: Self-pay | Admitting: HEMATOLOGY-ONCOLOGY

## 2024-03-16 VITALS — BP 151/78 | HR 70 | Temp 98.5°F | Ht 64.0 in | Wt 193.4 lb

## 2024-03-16 DIAGNOSIS — D472 Monoclonal gammopathy: Secondary | ICD-10-CM | POA: Insufficient documentation

## 2024-03-16 DIAGNOSIS — C50919 Malignant neoplasm of unspecified site of unspecified female breast: Secondary | ICD-10-CM

## 2024-03-16 DIAGNOSIS — S32000D Wedge compression fracture of unspecified lumbar vertebra, subsequent encounter for fracture with routine healing: Secondary | ICD-10-CM

## 2024-03-16 DIAGNOSIS — Z8542 Personal history of malignant neoplasm of other parts of uterus: Secondary | ICD-10-CM | POA: Insufficient documentation

## 2024-03-16 DIAGNOSIS — Z8719 Personal history of other diseases of the digestive system: Secondary | ICD-10-CM | POA: Insufficient documentation

## 2024-03-16 DIAGNOSIS — Z79620 Long term (current) use of immunosuppressive biologic: Secondary | ICD-10-CM | POA: Insufficient documentation

## 2024-03-16 DIAGNOSIS — M810 Age-related osteoporosis without current pathological fracture: Secondary | ICD-10-CM | POA: Insufficient documentation

## 2024-03-16 DIAGNOSIS — I82412 Acute embolism and thrombosis of left femoral vein: Secondary | ICD-10-CM

## 2024-03-16 DIAGNOSIS — Z7901 Long term (current) use of anticoagulants: Secondary | ICD-10-CM | POA: Insufficient documentation

## 2024-03-16 DIAGNOSIS — Z9889 Other specified postprocedural states: Secondary | ICD-10-CM | POA: Insufficient documentation

## 2024-03-16 DIAGNOSIS — Z08 Encounter for follow-up examination after completed treatment for malignant neoplasm: Secondary | ICD-10-CM | POA: Insufficient documentation

## 2024-03-16 DIAGNOSIS — Z85118 Personal history of other malignant neoplasm of bronchus and lung: Secondary | ICD-10-CM

## 2024-03-16 DIAGNOSIS — Z86718 Personal history of other venous thrombosis and embolism: Secondary | ICD-10-CM | POA: Insufficient documentation

## 2024-03-16 DIAGNOSIS — Z79899 Other long term (current) drug therapy: Secondary | ICD-10-CM | POA: Insufficient documentation

## 2024-03-16 DIAGNOSIS — Z860100 Personal history of colon polyps, unspecified: Secondary | ICD-10-CM | POA: Insufficient documentation

## 2024-03-16 DIAGNOSIS — Z853 Personal history of malignant neoplasm of breast: Secondary | ICD-10-CM | POA: Insufficient documentation

## 2024-03-16 LAB — CBC WITH DIFF
BASOPHIL #: 0 x10ˆ3/uL (ref 0.00–0.10)
BASOPHIL %: 1 % (ref 0–1)
EOSINOPHIL #: 0.1 x10ˆ3/uL (ref 0.00–0.50)
EOSINOPHIL %: 2 % (ref 1–7)
HCT: 34 % (ref 31.2–41.9)
HGB: 11.4 g/dL (ref 10.9–14.3)
LYMPHOCYTE #: 1.2 x10ˆ3/uL (ref 1.10–3.10)
LYMPHOCYTE %: 38 % (ref 16–46)
MCH: 28.4 pg (ref 24.7–32.8)
MCHC: 33.6 g/dL (ref 32.3–35.6)
MCV: 84.6 fL (ref 75.5–95.3)
MONOCYTE #: 0.3 x10ˆ3/uL (ref 0.20–0.90)
MONOCYTE %: 9 % (ref 4–11)
MPV: 8.9 fL (ref 7.9–10.8)
NEUTROPHIL #: 1.6 x10ˆ3/uL — ABNORMAL LOW (ref 1.90–8.20)
NEUTROPHIL %: 50 % (ref 43–77)
PLATELETS: 128 x10ˆ3/uL — ABNORMAL LOW (ref 140–440)
RBC: 4.02 x10ˆ6/uL (ref 3.63–4.92)
RDW: 15.3 % (ref 12.3–17.7)
WBC: 3.1 x10ˆ3/uL — ABNORMAL LOW (ref 3.8–11.8)

## 2024-03-16 LAB — COMPREHENSIVE METABOLIC PANEL, NON-FASTING
ALBUMIN/GLOBULIN RATIO: 1.2 (ref 0.8–1.4)
ALBUMIN: 3.7 g/dL (ref 3.5–5.7)
ALKALINE PHOSPHATASE: 94 U/L (ref 34–104)
ALT (SGPT): 20 U/L (ref 7–52)
ANION GAP: 5 mmol/L (ref 4–13)
AST (SGOT): 29 U/L (ref 13–39)
BILIRUBIN TOTAL: 0.6 mg/dL (ref 0.3–1.0)
BUN/CREA RATIO: 14 (ref 6–22)
BUN: 14 mg/dL (ref 7–25)
CALCIUM, CORRECTED: 10.7 mg/dL (ref 8.9–10.8)
CALCIUM: 10.5 mg/dL — ABNORMAL HIGH (ref 8.6–10.3)
CHLORIDE: 105 mmol/L (ref 98–107)
CO2 TOTAL: 29 mmol/L (ref 21–31)
CREATININE: 1.02 mg/dL (ref 0.60–1.30)
ESTIMATED GFR: 55 mL/min/1.73mˆ2 — ABNORMAL LOW (ref >59)
GLOBULIN: 3 (ref 2.0–3.5)
GLUCOSE: 145 mg/dL — ABNORMAL HIGH (ref 74–109)
OSMOLALITY, CALCULATED: 281 mosm/kg (ref 270–290)
POTASSIUM: 3.8 mmol/L (ref 3.5–5.1)
PROTEIN TOTAL: 6.7 g/dL (ref 6.4–8.9)
SODIUM: 139 mmol/L (ref 136–145)

## 2024-03-16 LAB — MONOCLONAL GAMMOPATHY PROFILE WITH IMMUNOTYPING REFLEX
ALBUMIN: 3 g/dL
KAPPA FREE LIGHT CHAINS: 4.4 mg/dL — ABNORMAL HIGH (ref 1.25–3.25)
KAPPA/LAMBDA FLC RATIO: 1.5 (ref 0.80–2.10)
LAMBDA FREE LIGHT CHAINS: 2.93 mg/dL — ABNORMAL HIGH (ref 0.60–2.70)
TOTAL PROTEIN: 5.5 g/dL

## 2024-03-16 LAB — IMMUNOGLOBULIN PROFILE (IGA, IGG, AND IGM), SERUM
IMMUNOGLOBULIN A (IGA): 118 mg/dL (ref 85–499)
IMMUNOGLOBULIN G (IGG): 1024 mg/dL (ref 610–1616)
IMMUNOGLOBULIN M (IGM): 388 mg/dL — ABNORMAL HIGH (ref 35–242)

## 2024-03-16 LAB — MAGNESIUM: MAGNESIUM: 1.3 mg/dL — ABNORMAL LOW (ref 1.9–2.7)

## 2024-03-16 MED ORDER — DENOSUMAB 60 MG/ML SUBCUTANEOUS SYRINGE
60.0000 mg | INJECTION | Freq: Once | SUBCUTANEOUS | Status: AC
  Administered 2024-03-16: 60 mg via SUBCUTANEOUS
  Filled 2024-03-16: qty 1

## 2024-03-16 NOTE — Nurses Notes (Signed)
 [phone] patient arrived to floor via wheelchair. Patient here for prolia  injection. Patient seen by [facility] op onc clinic prior to admission with infusion center. Labs collected during clinic visit. Prolia  given in the left arm. Patient tolerated well. Patient left floor with return appt for Friday for magnesium  infusion. Mag 1.3. Leandrew Kitty, RN

## 2024-03-16 NOTE — Progress Notes (Signed)
 Department of Hematology/Oncology  Progress Note   Name: Kelsey Preston  FMW:[id number]  Date of Birth: June 30, 1940  Encounter Date: 03/16/2024    REFERRING PROVIDER:  Andra Pastor, MD  [address]  [place],  [place] [address]    TELEMEDICINE DOCUMENTATION:  Patient Location:  [facility], [facility] outpatient Hematology/Oncology [address], [place] [address]  Patient/family aware of provider location:  yes  Patient/family consent for telemedicine:  yes    REASON FOR OFFICE VISIT:  Breast Cancer (Possible urothelial neoplasm)     HISTORY OF PRESENT ILLNESS:  Kelsey Preston is a 83 y.o. female who presents today for follow up of breast cancer     The patient has a history of non-small-cell lung cancer, status post lobectomy in 2014.      She also has a history of endometrial cancer, and is status post surgical resection of that cancer as well.    More recently, the patient was found to have a left axillary lesion measuring 2.3 cm on PET-CT scan.  For some reason, I can not find an actual size measurement on the pathology report.  Multiple imaging studies of the breast were performed, but no primary lesion was found.  The malignant lesion was definitively classified as a lymph node.    Receptor staining showed that it was ER negative and HER2 Neu positive.  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    09/10/2022: The patient is here for follow up of early stage breast cancer.  She is doing well and denies any new problems at this time.    09/27/2022: The patient is here for follow up breast cancer.  She is doing well at this time and has no new complaints.    10/18/2022: the patient is here for follow up of breast cancer. She has been on treatment with paclitaxel  and trastuzumab . She has been hospitalized since the last visit due to neutropenia and hypotension. She has also had electrolyte issues, with her magnesium  level being somewhat low. She is taking over-the-counter magnesium   oxide.    11/08/2022: The patient is here for follow up of breast cancer.  She has been on treatment with weekly paclitaxel  and trastuzumab .  She did end up in the hospital relatively recently with some low cell counts, but it appears to have resolved.    12/06/2022: The patient is here for follow up of breast cancer.  She has been doing relatively well but has noticed some peripheral neuropathy.    02/11/2023: The patient is here for follow up of breast cancer.  She was on trastuzumab , and it was held due to a percentage decline in her ejection fraction.  She had a recent follow up echocardiogram which showed complete recovery.    11/07/2023: The patient is here for follow up of breast cancer.  She has a couple of treatments of trastuzumab  remaining.  She denies any problems at this time.  She is inquiring about being able to discontinue her anticoagulant because she had a recent lower extremity Doppler that showed no evidence of residual clot.    11/28/2023: The patient is here for follow up of breast cancer.  She denies any issues at this time.    03/05/2024: Patient is here for follow up breast cancer.  She denies any new issues to that has been receiving magnesium  infusions quite regularly, and states she has been avoiding oral replacement because diarrhea associated with magnesium  citrate.    03/16/2024: The patient is here  for follow up from recent hospitalization.  She was apparently having some GI bleeding, and ended up having a colonoscopy in the hospital which revealed two polyps, one of which was quite large.  They were removed in their entirety, and neither one was found to have any malignancy.  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    ROS:   Pertinent review of systems as discussed  in HPI    HISTORY:  Past Medical History:   Diagnosis Date    Diabetes mellitus, type 2     Embolism (CMS HCC)     left leg    Endometrial cancer     HTN (hypertension)     Hx of breast cancer     Lung cancer     Macular degeneration (senile) of retina          Past Surgical History:   Procedure Laterality Date    HIP SURGERY Left     HX CHOLECYSTECTOMY      HX COLONOSCOPY      HX HYSTERECTOMY      HX LOBECTOMY Left     LUNG CANCER SURGERY      PORTACATH PLACEMENT           Social History     Socioeconomic History    Marital status: Married     Spouse name: Not on file    Number of children: Not on file    Years of education: Not on file    Highest education level: Not on file   Occupational History    Not on file   Tobacco Use    Smoking status: Never     Passive exposure: Never    Smokeless tobacco: Never   Vaping Use    Vaping status: Never Used   Substance and Sexual Activity    Alcohol  use: Never    Drug use: Never    Sexual activity: Not Currently   Other Topics Concern    Not on file   Social History Narrative    Not on file     Social Determinants of Health     Financial Resource Strain: Medium Risk (01/29/2023)    Received from [facility]    Overall Financial Resource Strain (CARDIA)     Difficulty of Paying Living Expenses: Somewhat hard   Transportation Needs: No Transportation Needs (01/29/2023)    Received from [facility] - Transportation     Lack of Transportation (Medical): No     Lack of Transportation (Non-Medical): No   Social Connections: Low Risk (03/07/2024)    Social Connections     SDOH Social Isolation: 5 or more times a week   Intimate Partner Violence: Not At Risk (01/29/2023)    Received from [facility]    Humiliation, Afraid, Rape, and Kick questionnaire     Within the last year, have you been afraid of your partner or ex-partner?: No     Within the last year, have you been humiliated or emotionally abused in other ways by your partner or ex-partner?: No      Within the last year, have you been kicked, hit, slapped, or otherwise physically hurt by your partner or ex-partner?: No  Within the last year, have you been raped or forced to have any kind of sexual activity by your partner or ex-partner?: No   Housing Stability: Low Risk  (01/29/2023)    Received from [facility] Stability Vital Sign     In the last 12 months, was there a time when you were not able to pay the mortgage or rent on time?: No     In the past 12 months, how many times have you moved where you were living?: 1     At any time in the past 12 months, were you homeless or living in a shelter (including now)?: No     Family Medical History:       Problem Relation (Age of Onset)    Cancer Brother            Current Outpatient Medications   Medication Sig    atorvastatin  (LIPITOR) 40 mg Oral Tablet Take 1 Tablet (40 mg total) by mouth Every evening for 30 days    ergocalciferol, vitamin D2, (DRISDOL) 1,250 mcg (50,000 unit) Oral Capsule Take 1 Capsule (50,000 Units total) by mouth Every 7 days    esomeprazole magnesium  (NEXIUM) 40 mg Oral Capsule, Delayed Release(E.C.) Take 1 Capsule (40 mg total) by mouth Every morning before breakfast    ezetimibe  (ZETIA ) 10 mg Oral Tablet Take 1 Tablet (10 mg total) by mouth Every evening    famotidine  (PEPCID ) 40 mg Oral Tablet Take 1 Tablet (40 mg total) by mouth Twice daily    gabapentin  (NEURONTIN ) 100 mg Oral Capsule Take 1 Capsule (100 mg total) by mouth Every night    losartan  (COZAAR ) 50 mg Oral Tablet Take 1 Tablet (50 mg total) by mouth Daily    magnesium  glycinate 100 mg magnesium  Oral Capsule Take by mouth    metFORMIN (GLUCOPHAGE XR) 500 mg Oral Tablet Sustained Release 24 hr Take 1 Tablet (500 mg total) by mouth Twice daily    multivitamin with iron Oral Tablet Take 1 Tablet by mouth Daily    ondansetron  (ZOFRAN ) 4 mg Oral Tablet Take 1 Tablet (4 mg total) by mouth Every 6 hours as needed for Nausea/Vomiting Indications: prevent nausea and  vomiting from cancer chemotherapy    rivaroxaban  (XARELTO ) 10 mg Oral Tablet Take 1 Tablet (10 mg total) by mouth Every evening after dinner    sertraline  (ZOLOFT ) 50 mg Oral Tablet Take 1 Tablet (50 mg total) by mouth Daily    traMADoL  (ULTRAM ) 50 mg Oral Tablet Take 1 Tablet (50 mg total) by mouth Every 4 hours as needed for Pain    vit C/E/Zn/coppr/lutein /zeaxan (PRESERVISION AREDS-2 ORAL) Take 1 Tablet by mouth Twice daily     Allergies   Allergen Reactions    Adhesive Rash    Ceclor [Cefaclor]  Other Adverse Reaction (Add comment)     Pt states a Ceclor pill gave her blisters in her mouth.       PHYSICAL EXAM:  Most Recent Vitals    Flowsheet Row Telemedicine from 09/02/2022 in Hematology/Oncology, [facility]   Temperature 36.3 C (97.3 F) filed at... 09/02/2022 0911   Heart Rate 77 filed at... 09/02/2022 0911   Respiratory Rate --   BP (Non-Invasive) 174/91 filed at... 09/02/2022 0911   SpO2 95 % filed at... 09/02/2022 0911   Height 1.626 m (5' 4) filed at... 09/02/2022 0911   Weight 98.2 kg (216 lb 6.4 oz) filed at... 09/02/2022 0911   BMI (Calculated)  37.22 filed at... 09/02/2022 0911   BSA (Calculated) 2.11 filed at... 09/02/2022 0911      ECOG Status: (1) Restricted in physically strenuous activity, ambulatory and able to do work of light nature   Physical Exam    DIAGNOSTIC DATA:  No results found for this or any previous visit (from the past 82479 hours).    LABS:   CBC  Diff   Lab Results   Component Value Date/Time    WBC 3.1 (L) 03/16/2024 03:51 PM    HGB 11.4 03/16/2024 03:51 PM    HCT 34.0 03/16/2024 03:51 PM    PLTCNT 128 (L) 03/16/2024 03:51 PM    RBC 4.02 03/16/2024 03:51 PM    MCV 84.6 03/16/2024 03:51 PM    MCHC 33.6 03/16/2024 03:51 PM    MCH 28.4 03/16/2024 03:51 PM    RDW 15.3 03/16/2024 03:51 PM    MPV 8.9 03/16/2024 03:51 PM    Lab Results   Component Value Date/Time    PMNS 50 03/16/2024 03:51 PM    LYMPHOCYTES 38 03/16/2024 03:51 PM    EOSINOPHIL 2 03/16/2024 03:51 PM     MONOCYTES 9 03/16/2024 03:51 PM    BASOPHILS 1 03/16/2024 03:51 PM    BASOPHILS 0.00 03/16/2024 03:51 PM    PMNABS 1.60 (L) 03/16/2024 03:51 PM    LYMPHSABS 1.20 03/16/2024 03:51 PM    EOSABS 0.10 03/16/2024 03:51 PM    MONOSABS 0.30 03/16/2024 03:51 PM    BASABS 0.04 05/30/2022 05:40 AM            Comprehensive Metabolic Profile    Lab Results   Component Value Date    SODIUM 139 03/09/2024    POTASSIUM 4.2 03/09/2024    CHLORIDE 103 03/09/2024    CO2 32 (H) 03/09/2024    ANIONGAP 4 03/09/2024    BUN 10 03/09/2024    CREATININE 0.95 03/09/2024    ALBUMIN 3.4 (L) 03/09/2024    ALBUMIN 3.0 (L) 03/09/2024    CALCIUM 10.2 03/09/2024    GLUCOSENF 123 (H) 03/09/2024    ALKPHOS 87 03/09/2024    ALT 16 03/09/2024    AST 26 03/09/2024    TOTBILIRUBIN 0.7 03/09/2024    TOTALPROTEIN 6.0 (L) 03/09/2024    TOTALPROTEIN 5.5 (L) 03/09/2024          BASIC METABOLIC PANEL  Lab Results   Component Value Date    SODIUM 139 03/09/2024    POTASSIUM 4.2 03/09/2024    CHLORIDE 103 03/09/2024    CO2 32 (H) 03/09/2024    ANIONGAP 4 03/09/2024    BUN 10 03/09/2024    CREATININE 0.95 03/09/2024    BUNCRRATIO 11 03/09/2024    GFR 60 03/09/2024    CALCIUM 10.2 03/09/2024    GLUCOSENF 123 (H) 03/09/2024           ASSESSMENT:  Problem List Items Addressed This Visit    None       No diagnosis found.       PLAN:   1. All relevant medical records were reviewed including available pertinent provider notes, procedure notes, imaging, laboratory, and pathology.   2. All pertinent labs and/or imaging were reviewed with the patient.   3. Breast cancer:  Status post resection of an involved lymph node that was 2.3 cm on PET/CT.  No primary lesion was seen on imaging studies.  T0 N1 M0, stage IIA.  ER negative, HER2 Neu positive.  After resection of the node, she was  rendered cancer-free radiographically.  We will continue with surveillance, and I will see her back in three months with laboratory studies.   Her most recent tumor marker was in normal  range.  4. History of lung cancer:  Continue surveillance.    5. History of gynecologic cancer: Continue surveillance as well.  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   She is on Xarelto  10 mg per day because of the recent possible ovarian vein thrombosis.  7. History osteoporosis:  She would like to delay the denosumab  infusion until after the holidays because she states that it causes some discomforts.    8. Low magnesium : We discussed that there are different formulations of magnesium  that she can try, such as magnesium  glycinate.   9. Elevated IgM: This has been stable since 2021.   We did discuss the possibility that this could turn into Waldenstrom's, but we will continue with periodic monitoring and aim to check a level every six months or so.    Josalyn Dettmann was given the chance to ask questions, and these were answered to their satisfaction. The patient is welcome to call with any questions or concerns in the meantime.     On the day of the encounter, a total of 35 minutes was spent on this patient encounter including review of historical information, examination, documentation and post-visit activities.   No follow-ups on file. Keep scheduled appointment.     Lynwood Pipes, MD  03/16/2024 , 16:15  The patient was seen as part of a collaborative telemedicine service with Dr. Pipes who participated in the encounter by active presence via approved video/audio means for portions of the encounter.    The patient's insurance company bears full legal and financial responsibility resulting from any deviations that they cause to my recommended treatment plan.    CC:  Manus Endo, MD  [address]  [place] [address]    Endo Manus, MD  [address]  [place],  [place] [address]    This note was partially generated using MModal Fluency Direct system, and there may be some incorrect words, spellings, and punctuation that were  not noted in checking the note before saving.

## 2024-03-17 NOTE — Nursing Note (Signed)
 Patient for a return office visit to discuss results of recent scans.  Husband and daughter present during office visit and daughter-in-law present via FaceTime during visit.  Patient has been on surveillance since September 2025.  Patient was recently discharged from hospital after a GI bleed and had some polyps removed that were benign and there was a potential finding of an ovarian vein thrombosis on a scan, Dr. Moses discuss that patient is on Xarelto  and this dose should be sufficient to treat a clot.  Patient's sister-in-law did have questions regarding why IgM levels were drawn and what the results mean.  Dr. Moses did discuss that these levels have been stable since 2021 and that this could potentially indicate a Waldenstrom's lymphoma, however, that it is stable and does not require any treatment at this time; explained that we will check levels about every 6 months.  Patient to have Prolia  injection today.  Magnesium  level resulted at 1.3 and patient to return on 03/19/24 to receive IV magnesium  replacement, this is already been collaborated with outpatient infusion.  All questions and concerns were answered to satisfaction.

## 2024-03-19 ENCOUNTER — Encounter (HOSPITAL_COMMUNITY): Payer: Self-pay

## 2024-03-19 ENCOUNTER — Other Ambulatory Visit: Payer: Self-pay

## 2024-03-19 ENCOUNTER — Ambulatory Visit
Admission: RE | Admit: 2024-03-19 | Discharge: 2024-03-19 | Disposition: A | Discharge status: Home / Self Care | Source: Ambulatory Visit | Attending: HEMATOLOGY-ONCOLOGY | Admitting: HEMATOLOGY-ONCOLOGY

## 2024-03-19 MED ORDER — MAGNESIUM SULFATE 2 GRAM/50 ML (4 %) IN WATER INTRAVENOUS PIGGYBACK: 2.0000 g | INJECTION | INTRAVENOUS | Status: AC

## 2024-03-19 MED ORDER — MAGNESIUM SULFATE 2 GRAM/50 ML IN WATER IVPB PREMIX - Q1H X2 DEFAULT
4.0000 g | INJECTION | Freq: Once | INTRAVENOUS | Status: AC
  Administered 2024-03-19: 4 g via INTRAVENOUS
  Administered 2024-03-19: 0 g via INTRAVENOUS
  Filled 2024-03-19: qty 100

## 2024-03-19 NOTE — Nurses Notes (Signed)
 [phone]- Arrived to unit in wheelchair accompanied by family for Magnesium  replacement. Had labs collected 03/16/24 with Mg 1.3- 4gm replacement ordered. All assessments complete. VSS. No new complaints voiced. Richerd Oak, RN   1140- Right chest Portacath accessed, blood return noted, dressing applied. Tolerated well. Richerd Oak, RN   1153- Magnesium  4 gm replacement started. Richerd Oak, RN   [phone]- Mag replacement complete, line flushing. Tolerated well. Richerd Oak, RN   1435- VSS. Right chest Portacath flushed, blood return noted, deaccessed, pressure dressing applied. No complaints voiced. Left unit in wheelchair accompanied by family. Richerd Oak, RN

## 2024-03-26 ENCOUNTER — Ambulatory Visit: Attending: RADIATION ONCOLOGY

## 2024-03-26 ENCOUNTER — Ambulatory Visit
Admission: RE | Admit: 2024-03-26 | Discharge: 2024-03-26 | Disposition: A | Discharge status: Still In Hospital | Source: Ambulatory Visit | Attending: RADIATION ONCOLOGY | Admitting: RADIATION ONCOLOGY

## 2024-03-26 ENCOUNTER — Other Ambulatory Visit: Payer: Self-pay

## 2024-03-26 DIAGNOSIS — Z85118 Personal history of other malignant neoplasm of bronchus and lung: Secondary | ICD-10-CM | POA: Insufficient documentation

## 2024-03-26 DIAGNOSIS — Z853 Personal history of malignant neoplasm of breast: Secondary | ICD-10-CM | POA: Insufficient documentation

## 2024-03-26 DIAGNOSIS — Z08 Encounter for follow-up examination after completed treatment for malignant neoplasm: Secondary | ICD-10-CM | POA: Insufficient documentation

## 2024-03-26 DIAGNOSIS — Z79899 Other long term (current) drug therapy: Secondary | ICD-10-CM | POA: Insufficient documentation

## 2024-03-26 DIAGNOSIS — C50912 Malignant neoplasm of unspecified site of left female breast: Secondary | ICD-10-CM | POA: Insufficient documentation

## 2024-03-26 DIAGNOSIS — Z8589 Personal history of malignant neoplasm of other organs and systems: Secondary | ICD-10-CM | POA: Insufficient documentation

## 2024-03-26 DIAGNOSIS — C773 Secondary and unspecified malignant neoplasm of axilla and upper limb lymph nodes: Secondary | ICD-10-CM | POA: Insufficient documentation

## 2024-03-26 DIAGNOSIS — Z8542 Personal history of malignant neoplasm of other parts of uterus: Secondary | ICD-10-CM | POA: Insufficient documentation

## 2024-03-26 DIAGNOSIS — Z9889 Other specified postprocedural states: Secondary | ICD-10-CM | POA: Insufficient documentation

## 2024-03-26 NOTE — Progress Notes (Signed)
 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  Continuing trastuzumab .       Assessment:  84 year old doing well with no signs of recurrence on Signatera 3 months ago.    Oncology disease status: The patient has no clinical or biochemical evidence of cancer at this visit    Recommendations:  We will repeat Signatera at this time and follow up in 3 months.  She is instructed to call in the interim should she have any problems or concerns.    The indications, time course, benefits, risks and side effects of radiation treatment were explained to the patient, and her questions were answered to her apparent satisfaction. I encouraged her to contact us  at any time should she have any further questions or concerns. I personally saw and examined the patient, and reviewed all prior imaging and pathologic findings with her. I spent greater than 50% of a 30 minute visit in discussion of the patient's diagnosis and management.    FULL NOTE     Interval History :   Kelsey Preston is a 84 y.o. female with a history of 2 prior cancers in the distant past.  She had prior resection for a endometrial cancer as well as a primary lung cancer.  She has done well without evidence of recurrence of either of these cancers.  She was noted to have an enlarging axillary lymph node.  Testing including PET-CT showed uptake in the lymph node.  Biopsy was performed April of the 2nd that showed cancer.  Lymph node was resected May 21st.  Pathology showed 2.6 cm lymph node that was essentially entirely replaced by tumor.  Caris testing showed this to be ER negative PR negative HER2 Neu positive.  She was seen by Dr. Moses in his completed course of paclitaxel  with trastuzumab .  She will now continue trismus pertuzumab for  entire year.  She has had some difficulty with neuropathy.  She has had 2 falls resulting in hip fracture and compression fracture of her spine.     She returns in follow up today.  She has no new complaints.  Last Signatera 3 months ago was negative.  She is not receiving any active treatments.  She requires magnesium  replacement, which is monitored by Dr. Moses.    Pain Assessment:  Musculoskeletal aches and pains    Past Medical/Surgical History:  Past Medical History:   Diagnosis Date    Diabetes mellitus, type 2     Embolism (CMS HCC)     left leg    Endometrial cancer     HTN (hypertension)     Hx of breast cancer     Lung cancer     Macular degeneration (senile) of retina          Past Surgical History:   Procedure Laterality Date    HIP SURGERY Left     HX CHOLECYSTECTOMY      HX COLONOSCOPY      HX HYSTERECTOMY      HX LOBECTOMY Left     LUNG CANCER SURGERY      PORTACATH PLACEMENT             Family History:   Family  Medical History:       Problem Relation (Age of Onset)    Cancer Brother              Social History:   Social History     Socioeconomic History    Marital status: Married     Spouse name: Not on file    Number of children: Not on file    Years of education: Not on file    Highest education level: Not on file   Occupational History    Not on file   Tobacco Use    Smoking status: Never     Passive exposure: Never    Smokeless tobacco: Never   Vaping Use    Vaping status: Never Used   Substance and Sexual Activity    Alcohol  use: Never    Drug use: Never    Sexual activity: Not Currently   Other Topics Concern    Not on file   Social History Narrative    Not on file     Social Determinants of Health     Financial Resource Strain: Medium Risk (01/29/2023)    Received from [facility]    Overall Financial Resource Strain (CARDIA)     Difficulty of Paying Living Expenses: Somewhat hard   Transportation Needs: No Transportation Needs (01/29/2023)    Received from [facility] -  Transportation     Lack of Transportation (Medical): No     Lack of Transportation (Non-Medical): No   Social Connections: Low Risk (03/07/2024)    Social Connections     SDOH Social Isolation: 5 or more times a week   Intimate Partner Violence: Not At Risk (01/29/2023)    Received from [facility]    Humiliation, Afraid, Rape, and Kick questionnaire     Within the last year, have you been afraid of your partner or ex-partner?: No     Within the last year, have you been humiliated or emotionally abused in other ways by your partner or ex-partner?: No     Within the last year, have you been kicked, hit, slapped, or otherwise physically hurt by your partner or ex-partner?: No     Within the last year, have you been raped or forced to have any kind of sexual activity by your partner or ex-partner?: No   Housing Stability: Low Risk  (01/29/2023)    Received from [facility] Stability Vital Sign     In the last 12 months, was there a time when you were not able to pay the mortgage or rent on time?: No     In the past 12 months, how many times have you moved where you were living?: 1     At any time in the past 12 months, were you homeless or living in a shelter (including now)?: No       ALLERGIES:   Allergies   Allergen Reactions    Adhesive Rash    Ceclor [Cefaclor]  Other Adverse Reaction (Add comment)     Pt states a Ceclor pill gave her blisters in her mouth.        MEDICATIONS:   Current Outpatient Medications   Medication Instructions    atorvastatin  (LIPITOR) 40 mg, Oral, EVERY EVENING    ergocalciferol (vitamin D2) (DRISDOL) 50,000 Units, EVERY 7 DAYS    esomeprazole magnesium  (NEXIUM) 40 mg, EVERY MORNING BEFORE BREAKFAST    ezetimibe  (ZETIA ) 10 mg, EVERY EVENING  famotidine  (PEPCID ) 40 mg, Oral, 2 TIMES DAILY    gabapentin  (NEURONTIN ) 100 mg, Oral, NIGHTLY    losartan  (COZAAR ) 50 mg, Oral, Daily    magnesium  glycinate 100 mg, Oral, Daily    metFORMIN (GLUCOPHAGE XR) 500 mg, 2 TIMES DAILY     multivitamin with iron Oral Tablet 1 Tablet, Daily    ondansetron  (ZOFRAN ) 4 mg, Oral, EVERY 6 HOURS PRN    rivaroxaban  (XARELTO ) 10 mg, Oral, EVERY EVENING AFTER DINNER    sertraline  (ZOLOFT ) 50 mg, Daily    traMADoL  (ULTRAM ) 50 mg, Oral, EVERY 4 HOURS PRN    vit C/E/Zn/coppr/lutein /zeaxan (PRESERVISION AREDS-2 ORAL) 1 Tablet, 2 TIMES DAILY        REVIEW OF SYSTEMS  Pertinent review of systems as discussed in Interval History.      Objective:     There were no vitals filed for this visit.            PHYSICAL EXAMINATION  Physical Exam  Constitutional:       Appearance: Normal appearance.   HENT:      Head: Normocephalic.   Eyes:      Extraocular Movements: Extraocular movements intact.      Pupils: Pupils are equal, round, and reactive to light.   Pulmonary:      Effort: Pulmonary effort is normal.   Musculoskeletal:         General: Normal range of motion.   Skin:     General: Skin is warm and dry.   Neurological:      General: No focal deficit present.      Mental Status: She is alert and oriented to person, place, and time.   Psychiatric:         Mood and Affect: Mood normal.         Behavior: Behavior normal.          LABS/IMAGING: All relevant labs and imaging were reviewed as per HPI.      Fairy Patten, MD 03/26/2024, 13:47    rr:[id number]@

## 2024-03-26 NOTE — Addendum Note (Signed)
 Encounter addended by: Dasie Gables, RN on: 03/26/2024 2:22 PM   Actions taken: Allergies reviewed, Medication List reviewed, Flowsheet accepted

## 2024-03-29 LAB — 7 DAY EXTENDED HOLTER MONITOR
Enrollment Period End: 20260102100645
Enrollment Period Start: 20251226150827
Heart rate (average): 76 {beats}/min
Isolated SVE count: 1625 episodes
Isolated VE Counts: 1891 episodes
Longest supraventricular tachycardia episode - duration: 5.8 s
Longest supraventricular tachycardia episode - heart rate (: 173 {beats}/min
Longest supraventricular tachycardia episode - number of be: 17 beats
Longest trigeminy - duration: 5.7 s
SVE Couplets Counts: 35 episodes
SVE Triplets Counts: 22 episodes
Supraventricular tachycardia - heart rate (average): 137 {beats}/min
Supraventricular tachycardia - number of episodes: 22
Supraventricular tachycardia with fastest heart rate - dura: 5.8 s
Supraventricular tachycardia with fastest heart rate - hear: 173 {beats}/min
Supraventricular tachycardia with fastest heart rate - numb: 17 beats
VE Couplets Counts: 3 episodes
Ventricular tachycardia - heart rate (average): -1 {beats}/min

## 2024-03-31 ENCOUNTER — Other Ambulatory Visit (INDEPENDENT_AMBULATORY_CARE_PROVIDER_SITE_OTHER): Payer: Self-pay | Admitting: HEMATOLOGY-ONCOLOGY

## 2024-03-31 ENCOUNTER — Encounter (INDEPENDENT_AMBULATORY_CARE_PROVIDER_SITE_OTHER): Payer: Self-pay | Admitting: HEMATOLOGY-ONCOLOGY

## 2024-03-31 ENCOUNTER — Ambulatory Visit (HOSPITAL_COMMUNITY): Payer: Self-pay

## 2024-04-01 ENCOUNTER — Encounter (INDEPENDENT_AMBULATORY_CARE_PROVIDER_SITE_OTHER): Payer: Self-pay | Admitting: HEMATOLOGY-ONCOLOGY

## 2024-04-02 ENCOUNTER — Other Ambulatory Visit: Payer: Self-pay

## 2024-04-02 ENCOUNTER — Ambulatory Visit
Admission: RE | Admit: 2024-04-02 | Discharge: 2024-04-02 | Disposition: A | Discharge status: Home / Self Care | Source: Ambulatory Visit | Attending: HEMATOLOGY-ONCOLOGY | Admitting: HEMATOLOGY-ONCOLOGY

## 2024-04-02 LAB — CBC WITH DIFF
BASOPHIL #: 0 x10ˆ3/uL (ref 0.00–0.10)
BASOPHIL %: 1 % (ref 0–1)
EOSINOPHIL #: 0.1 x10ˆ3/uL (ref 0.00–0.50)
EOSINOPHIL %: 3 % (ref 1–7)
HCT: 33.3 % (ref 31.2–41.9)
HGB: 11.2 g/dL (ref 10.9–14.3)
LYMPHOCYTE #: 1.2 x10ˆ3/uL (ref 1.10–3.10)
LYMPHOCYTE %: 40 % (ref 16–46)
MCH: 28.6 pg (ref 24.7–32.8)
MCHC: 33.8 g/dL (ref 32.3–35.6)
MCV: 84.5 fL (ref 75.5–95.3)
MONOCYTE #: 0.3 x10ˆ3/uL (ref 0.20–0.90)
MONOCYTE %: 9 % (ref 4–11)
MPV: 8.9 fL (ref 7.9–10.8)
NEUTROPHIL #: 1.5 x10ˆ3/uL — ABNORMAL LOW (ref 1.90–8.20)
NEUTROPHIL %: 48 % (ref 43–77)
PLATELETS: 147 x10ˆ3/uL (ref 140–440)
RBC: 3.94 x10ˆ6/uL (ref 3.63–4.92)
RDW: 15.9 % (ref 12.3–17.7)
WBC: 3.1 x10ˆ3/uL — ABNORMAL LOW (ref 3.8–11.8)

## 2024-04-02 LAB — COMPREHENSIVE METABOLIC PANEL, NON-FASTING
ALBUMIN/GLOBULIN RATIO: 1.3 (ref 0.8–1.4)
ALBUMIN: 3.8 g/dL (ref 3.5–5.7)
ALKALINE PHOSPHATASE: 90 U/L (ref 34–104)
ALT (SGPT): 18 U/L (ref 7–52)
ANION GAP: 5 mmol/L (ref 4–13)
AST (SGOT): 24 U/L (ref 13–39)
BILIRUBIN TOTAL: 0.6 mg/dL (ref 0.3–1.0)
BUN/CREA RATIO: 16 (ref 6–22)
BUN: 15 mg/dL (ref 7–25)
CALCIUM, CORRECTED: 9.9 mg/dL (ref 8.9–10.8)
CALCIUM: 9.7 mg/dL (ref 8.6–10.3)
CHLORIDE: 108 mmol/L — ABNORMAL HIGH (ref 98–107)
CO2 TOTAL: 28 mmol/L (ref 21–31)
CREATININE: 0.93 mg/dL (ref 0.60–1.30)
ESTIMATED GFR: 62 mL/min/1.73mˆ2 (ref >59)
GLOBULIN: 2.9 (ref 2.0–3.5)
GLUCOSE: 104 mg/dL (ref 74–109)
OSMOLALITY, CALCULATED: 282 mosm/kg (ref 270–290)
POTASSIUM: 4 mmol/L (ref 3.5–5.1)
PROTEIN TOTAL: 6.7 g/dL (ref 6.4–8.9)
SODIUM: 141 mmol/L (ref 136–145)

## 2024-04-02 LAB — MAGNESIUM: MAGNESIUM: 1.5 mg/dL — ABNORMAL LOW (ref 1.9–2.7)

## 2024-04-02 MED ORDER — MAGNESIUM SULFATE 2 GRAM/50 ML (4 %) IN WATER INTRAVENOUS PIGGYBACK
2.0000 g | INJECTION | Freq: Once | INTRAVENOUS | Status: AC
  Administered 2024-04-02: 2 g via INTRAVENOUS
  Administered 2024-04-02: 0 g via INTRAVENOUS
  Filled 2024-04-02: qty 50

## 2024-04-02 NOTE — Nurses Notes (Signed)
 Patient arrived and placed in room 220. Sitting in recliner. Denies any complaints or concerns. PAC accessed using sterile tech. Labs drawn via [facility] and sent to lab. Patient eating breakfast with her husband and daughter while waiting on lab results.Lura Budge, LPN

## 2024-04-02 NOTE — Nurses Notes (Signed)
 Patient received magnesium  2gm per parameters. Tolerated infusion well. No s/s of distress noted.  PAC flushed with 20 ml ns post infusion and PAC deaccessed. Site unremarkable. Patient ambulated to bathroom then taken out in wheelchair by family.Lura Budge, LPN

## 2024-04-03 ENCOUNTER — Encounter (INDEPENDENT_AMBULATORY_CARE_PROVIDER_SITE_OTHER): Payer: Self-pay | Admitting: HEMATOLOGY-ONCOLOGY

## 2024-04-14 ENCOUNTER — Ambulatory Visit

## 2024-04-15 ENCOUNTER — Other Ambulatory Visit (INDEPENDENT_AMBULATORY_CARE_PROVIDER_SITE_OTHER): Payer: Self-pay | Admitting: HEMATOLOGY-ONCOLOGY

## 2024-04-16 ENCOUNTER — Ambulatory Visit (HOSPITAL_COMMUNITY)

## 2024-04-19 ENCOUNTER — Other Ambulatory Visit (INDEPENDENT_AMBULATORY_CARE_PROVIDER_SITE_OTHER): Payer: Self-pay | Admitting: HEMATOLOGY-ONCOLOGY

## 2024-04-22 ENCOUNTER — Encounter (INDEPENDENT_AMBULATORY_CARE_PROVIDER_SITE_OTHER): Payer: Self-pay

## 2024-04-30 ENCOUNTER — Ambulatory Visit (HOSPITAL_COMMUNITY): Payer: Self-pay

## 2024-06-11 ENCOUNTER — Encounter (INDEPENDENT_AMBULATORY_CARE_PROVIDER_SITE_OTHER)

## 2024-06-18 ENCOUNTER — Ambulatory Visit (INDEPENDENT_AMBULATORY_CARE_PROVIDER_SITE_OTHER): Payer: Self-pay | Admitting: HEMATOLOGY-ONCOLOGY

## 2024-06-25 ENCOUNTER — Ambulatory Visit: Admitting: RADIATION ONCOLOGY

## 2024-09-03 ENCOUNTER — Ambulatory Visit

## 2024-09-17 ENCOUNTER — Ambulatory Visit (HOSPITAL_COMMUNITY)
